# Patient Record
Sex: Female | Born: 1945 | Race: White | Hispanic: No | Marital: Married | Smoking: Former smoker
Health system: Southern US, Community
[De-identification: ages and names within clinical notes are randomized; demographics above are authoritative.]

## PROBLEM LIST (undated history)

## (undated) DIAGNOSIS — Z87440 Personal history of urinary (tract) infections: Secondary | ICD-10-CM

## (undated) DIAGNOSIS — E785 Hyperlipidemia, unspecified: Secondary | ICD-10-CM

## (undated) DIAGNOSIS — T7840XA Allergy, unspecified, initial encounter: Secondary | ICD-10-CM

## (undated) DIAGNOSIS — C20 Malignant neoplasm of rectum: Secondary | ICD-10-CM

## (undated) DIAGNOSIS — Z87891 Personal history of nicotine dependence: Secondary | ICD-10-CM

## (undated) DIAGNOSIS — I10 Essential (primary) hypertension: Secondary | ICD-10-CM

## (undated) DIAGNOSIS — R112 Nausea with vomiting, unspecified: Secondary | ICD-10-CM

## (undated) DIAGNOSIS — R1032 Left lower quadrant pain: Secondary | ICD-10-CM

## (undated) DIAGNOSIS — Z1239 Encounter for other screening for malignant neoplasm of breast: Secondary | ICD-10-CM

## (undated) DIAGNOSIS — Z85038 Personal history of other malignant neoplasm of large intestine: Secondary | ICD-10-CM

## (undated) HISTORY — PX: COLONOSCOPY: SHX174

## (undated) HISTORY — DX: Personal history of urinary (tract) infections: Z87.440

## (undated) HISTORY — DX: Personal history of nicotine dependence: Z87.891

## (undated) HISTORY — DX: Encounter for other screening for malignant neoplasm of breast: Z12.39

## (undated) HISTORY — DX: Malignant neoplasm of rectum: C20

## (undated) HISTORY — DX: Personal history of other malignant neoplasm of large intestine: Z85.038

## (undated) HISTORY — DX: Essential (primary) hypertension: I10

## (undated) HISTORY — DX: Left lower quadrant pain: R10.32

## (undated) HISTORY — DX: Nausea with vomiting, unspecified: R11.2

---

## 2009-03-28 IMAGING — CR PELVIS - 1-2 VIEW
1 series · 1 of 1 positions shown · non-contrast
Comparison: none

REASON FOR EXAM: pelvic abscess
COMMENTS:

[view not recorded]
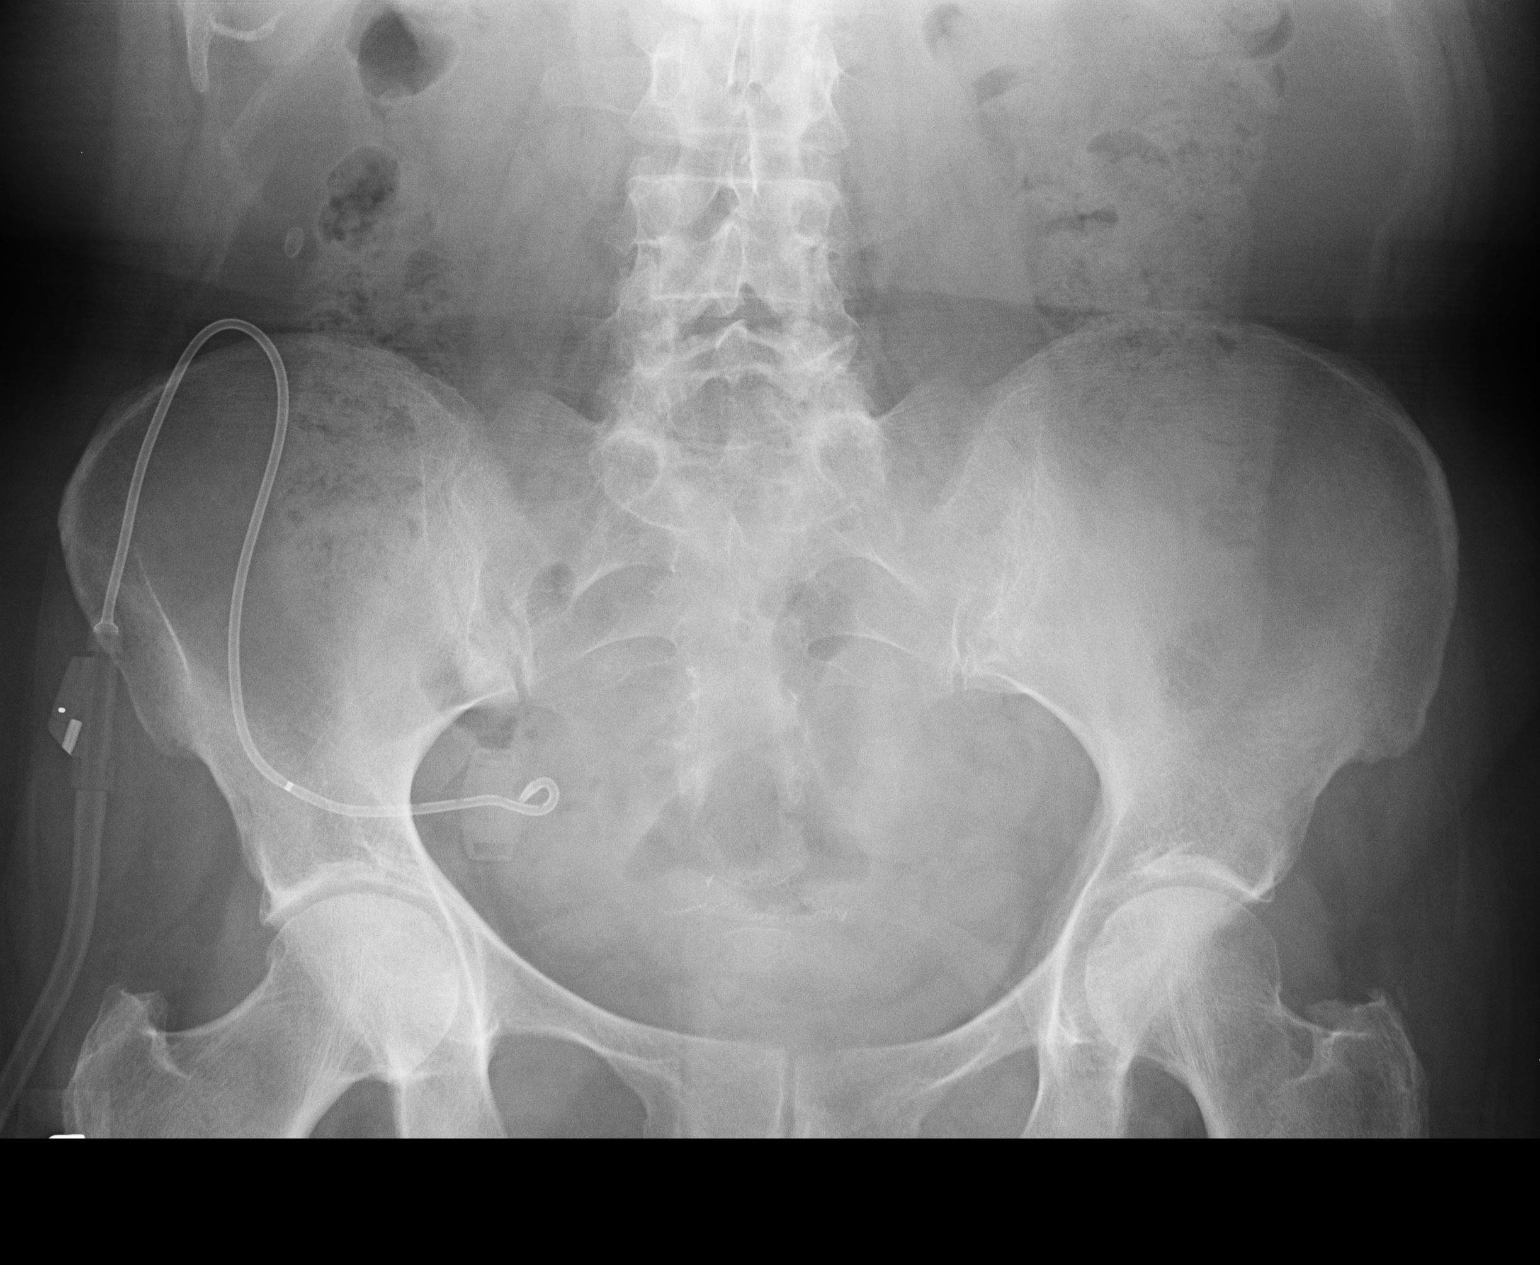

[1 of 1 positions shown; findings below may reference images not displayed]

PROCEDURE:     KDR - KDXR PELVIS AP ONLY  - [DATE]  [DATE]

RESULT:     Comparison is made to a prior exam of [DATE]. A drain
remains present on the right. The pelvic abscess noted in the clinical
history is not evident on routine radiography. The bony pelvis is normal in
appearance. There is a moderate amount of fecal material in the colon.
IMPRESSION: Please see above.

## 2009-05-20 DIAGNOSIS — Z87440 Personal history of urinary (tract) infections: Secondary | ICD-10-CM

## 2009-05-20 HISTORY — DX: Personal history of urinary (tract) infections: Z87.440

## 2010-01-29 ENCOUNTER — Ambulatory Visit: Payer: Self-pay

## 2010-02-19 ENCOUNTER — Ambulatory Visit: Payer: Self-pay | Admitting: Unknown Physician Specialty

## 2010-02-22 LAB — PATHOLOGY REPORT

## 2010-02-23 ENCOUNTER — Other Ambulatory Visit: Payer: Self-pay | Admitting: Unknown Physician Specialty

## 2010-02-26 ENCOUNTER — Ambulatory Visit: Payer: Self-pay | Admitting: Unknown Physician Specialty

## 2010-02-26 IMAGING — CT CT ABD-PELV W/ CM
1 of 3 series · 15 of 32 positions shown, 20 images · non-contrast
Comparison: none

REASON FOR EXAM: cancer of the rectum
COMMENTS:

PROCEDURE:     KCT - KCT ABDOMEN/PELVIS W  - [DATE]  [DATE]
RESULT:
HISTORY: Cancer of the rectum.

[Series 2: abd with 5.0 i40f · axial · 0.81mm/px · z∈[-1027,-612]mm · 15 of 95 slices shown, 20 images]
[im 6/95  soft-tissue]
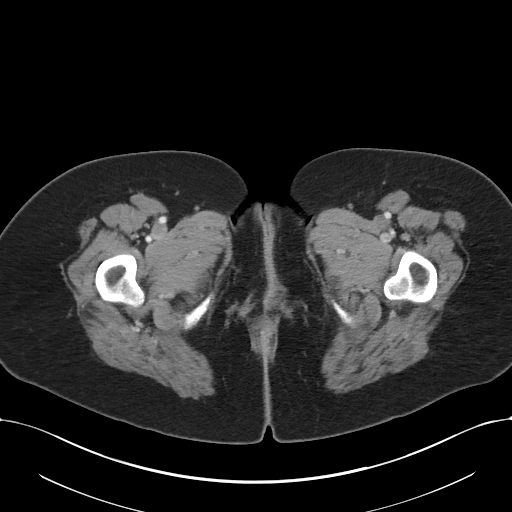
[im 6/95  bone]
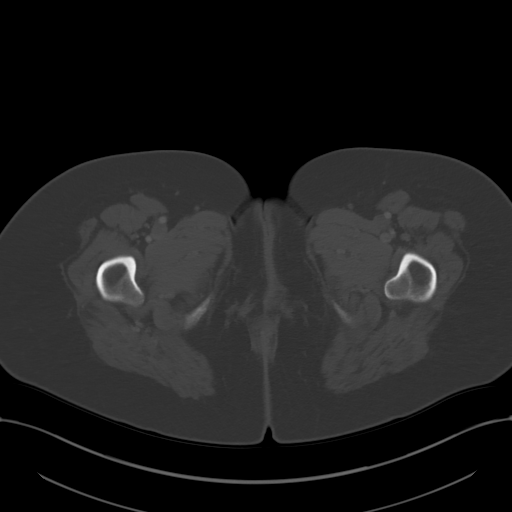
[im 11/95  soft-tissue]
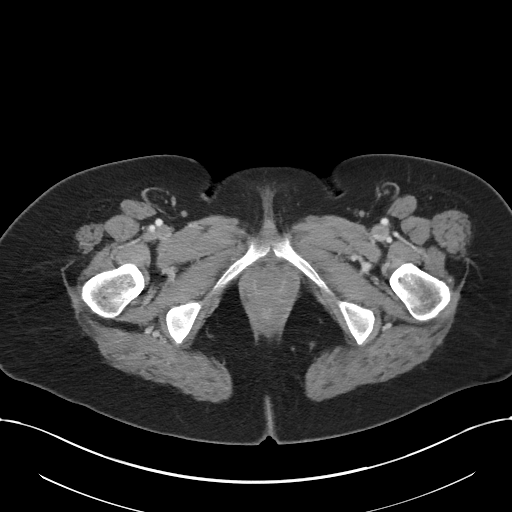
[im 21/95  soft-tissue]
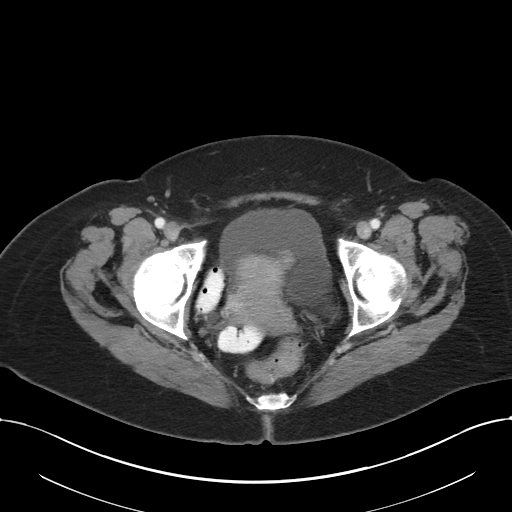
[im 27/95  soft-tissue]
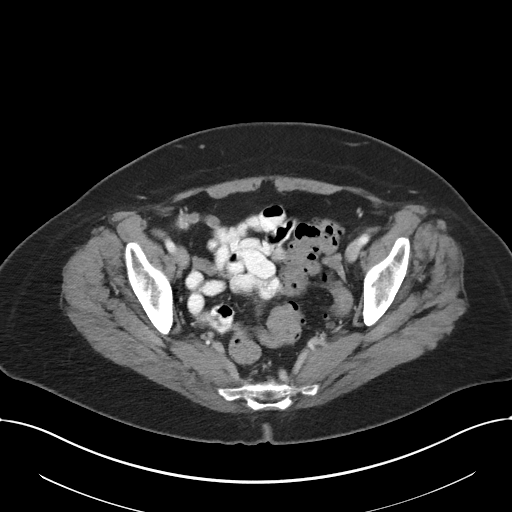
[im 32/95  soft-tissue]
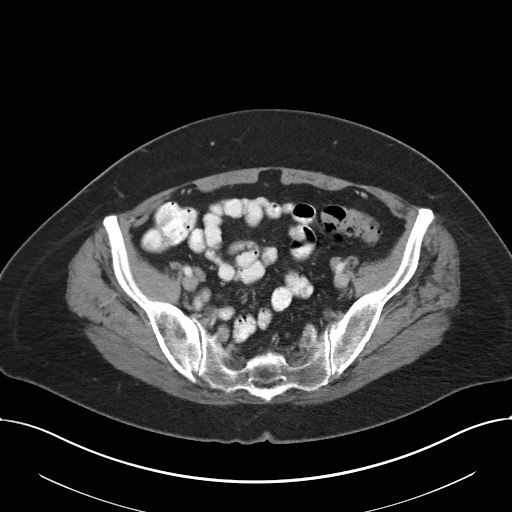
[im 37/95  soft-tissue]
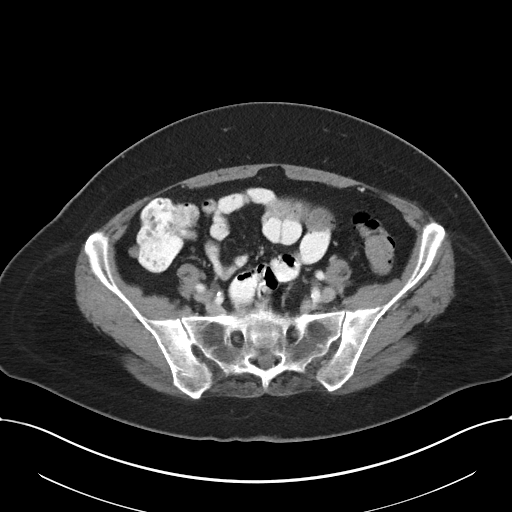
[im 42/95  soft-tissue]
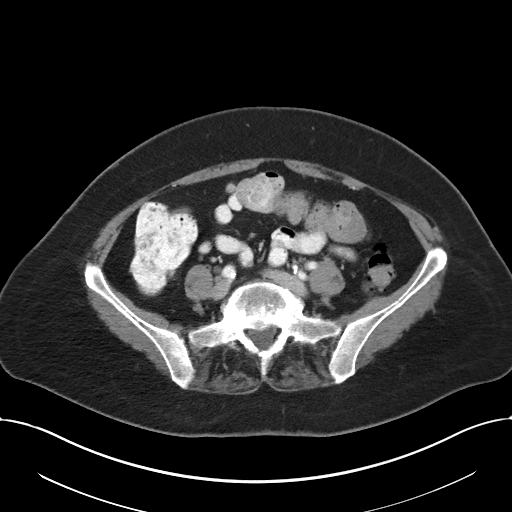
[im 53/95  soft-tissue]
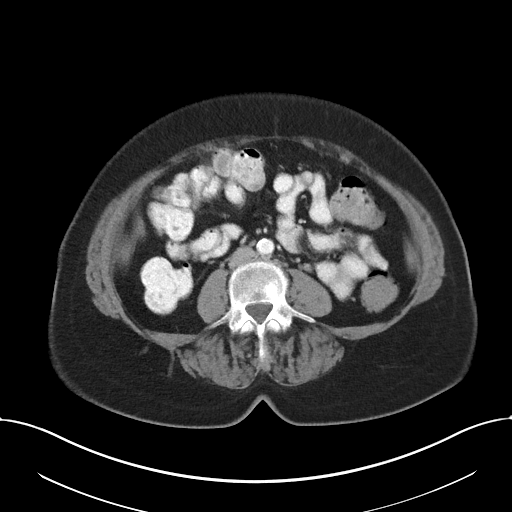
[im 58/95  soft-tissue]
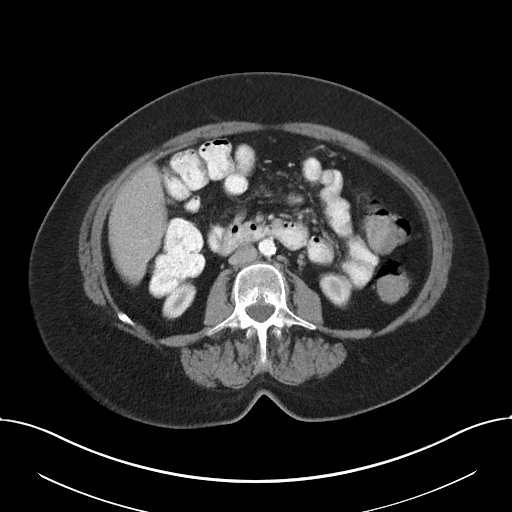
[im 58/95  bone]
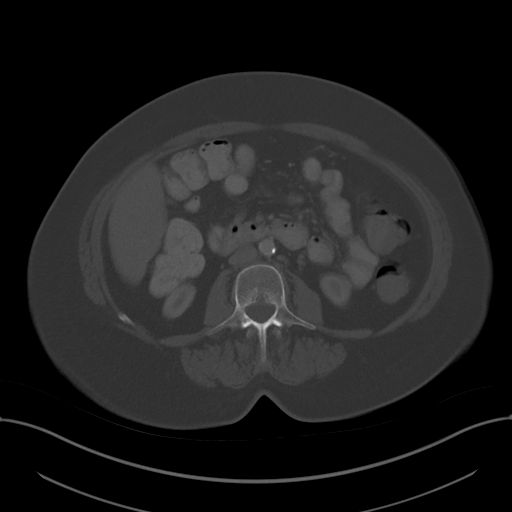
[im 63/95  soft-tissue]
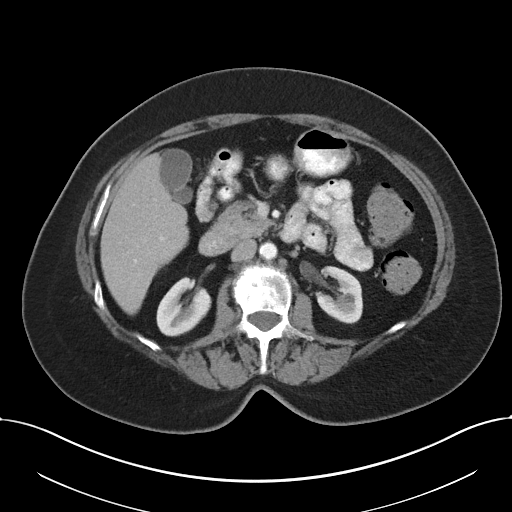
[im 68/95  soft-tissue]
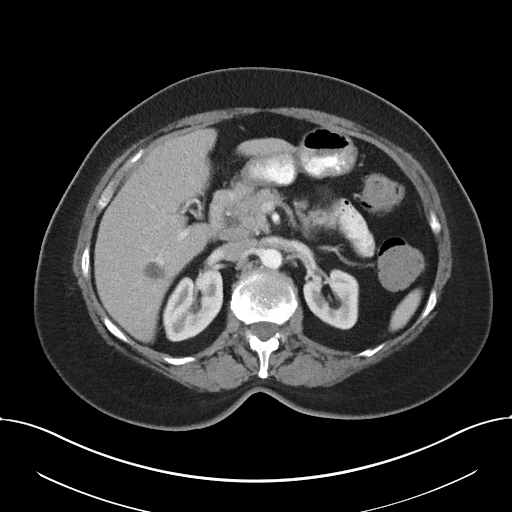
[im 74/95  soft-tissue]
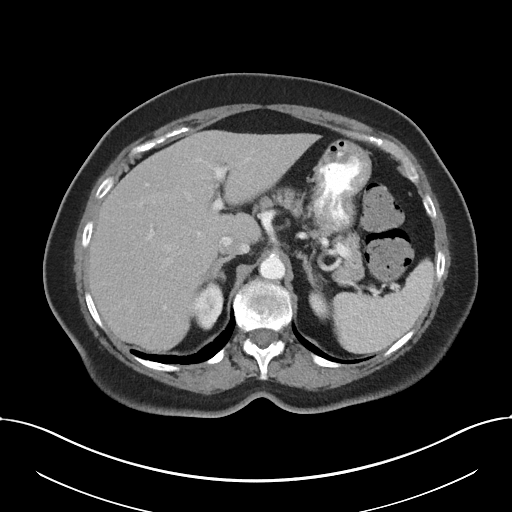
[im 74/95  lung]
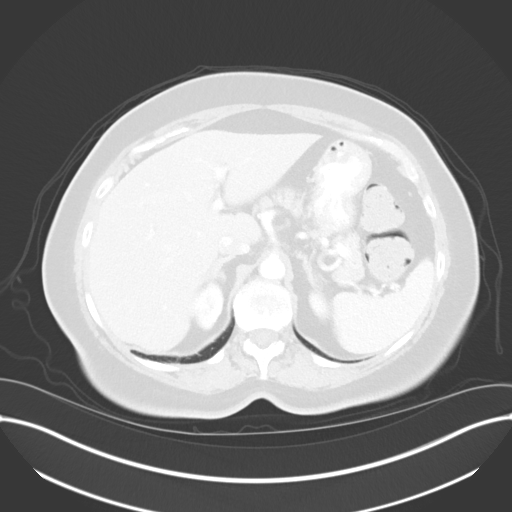
[im 79/95  lung]
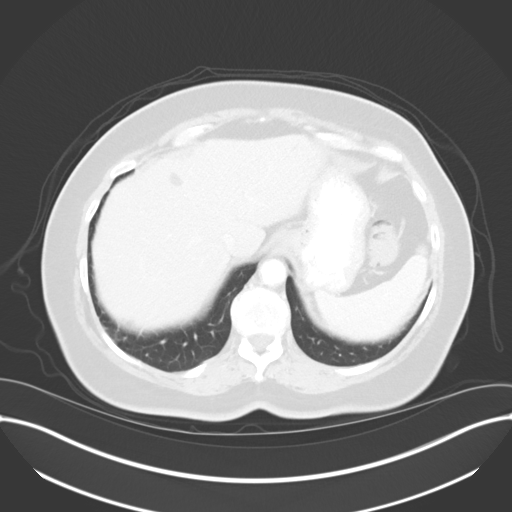
[im 84/95  soft-tissue]
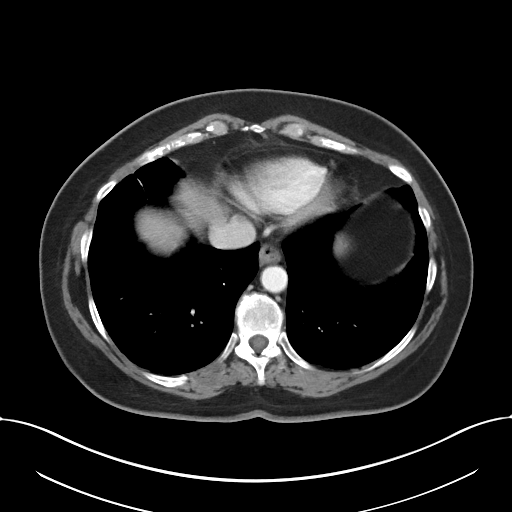
[im 84/95  lung]
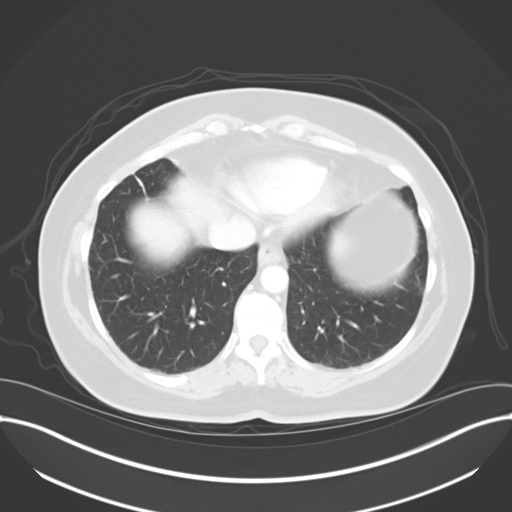
[im 89/95  soft-tissue]
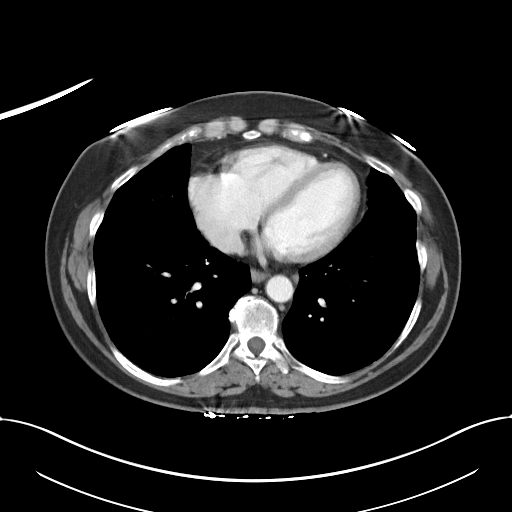
[im 89/95  lung]
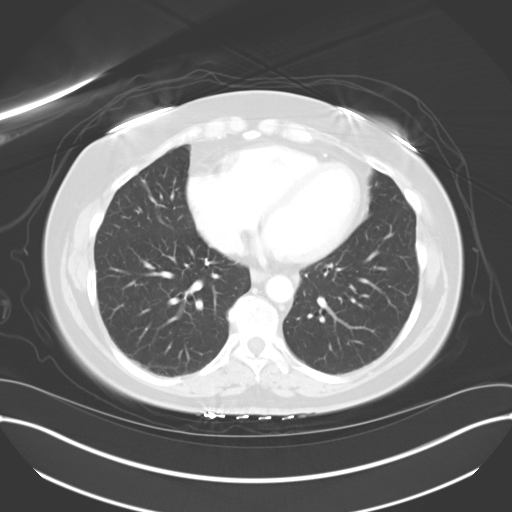

[15 of 32 positions shown; findings below may reference images not displayed]

FINDINGS: Standard CT was obtained with 100 mL of [0C]. The lung
bases are clear. No free air. Hepatic cyst is present. Spleen is normal.
Pancreas is normal. Renal cysts are present. Adrenals are normal. The
appendix measures 9 mm in diameter. Appendicitis could present in this
fashion. There is no periappendiceal swelling.
IMPRESSION: Distention of the appendix. Appendicitis cannot be excluded.

## 2010-03-05 ENCOUNTER — Ambulatory Visit: Payer: Self-pay | Admitting: General Surgery

## 2010-03-05 IMAGING — CR DG CHEST 2V
1 series · 2 of 2 positions shown · non-contrast
Comparison: none

REASON FOR EXAM: ca
COMMENTS:

[Series 1: view not recorded · 0.17mm/px · 2 of 2 slices shown]
[im 1/2]
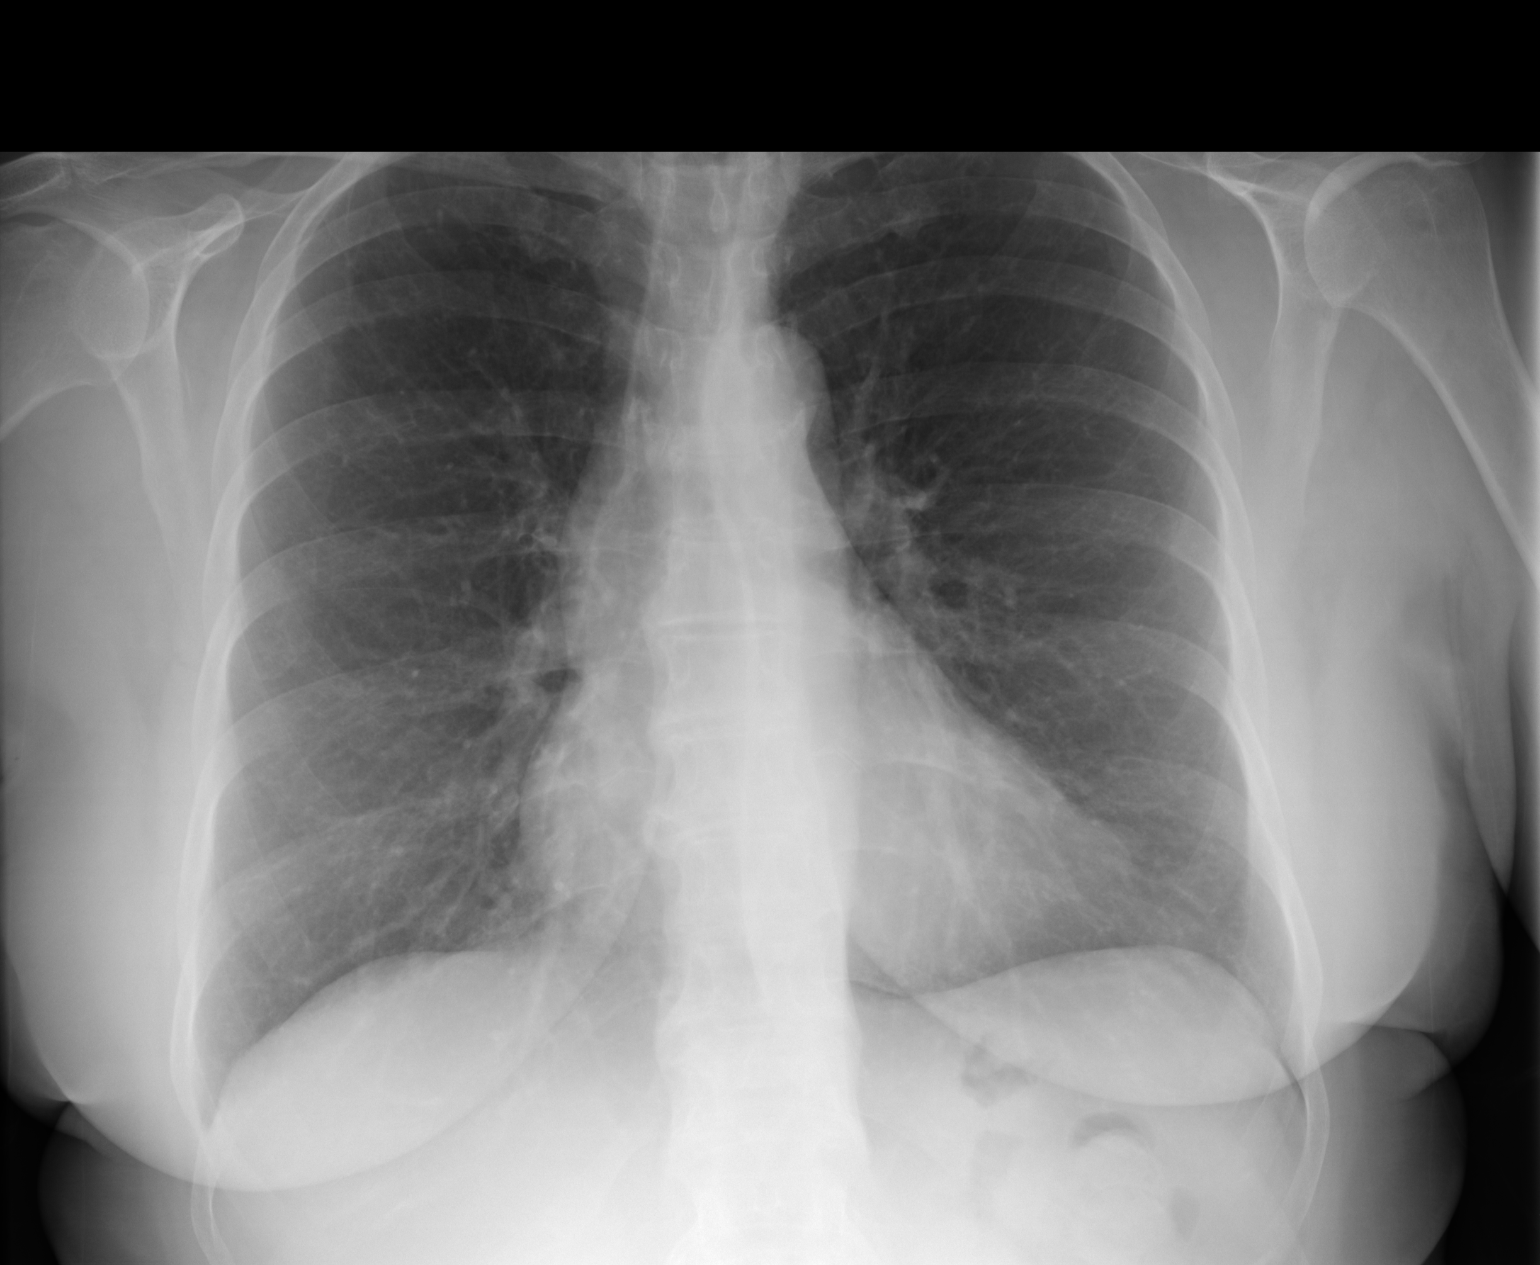
[im 2/2]
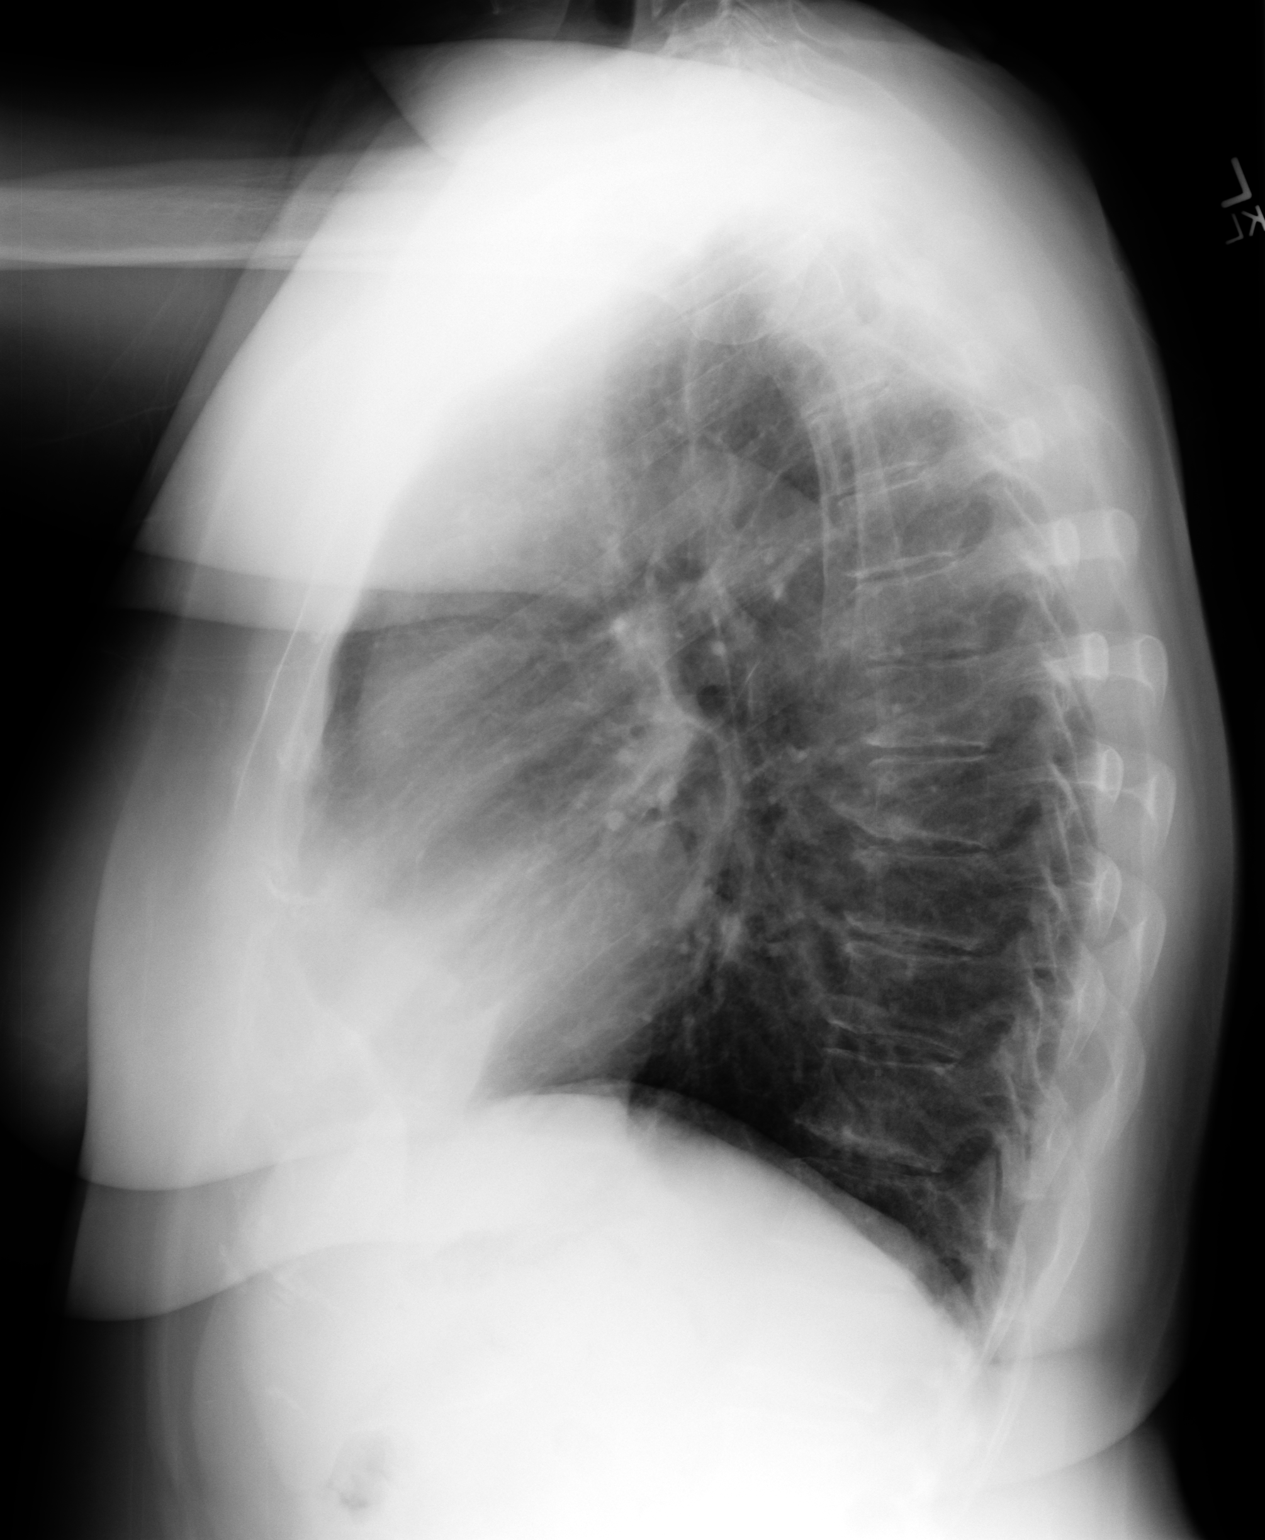

[2 of 2 positions shown; findings below may reference images not displayed]

PROCEDURE:     DXR - DXR CHEST PA (OR AP) AND LATERAL  - [DATE] [DATE]

RESULT:     The lungs are mildly hyperinflated. There is no evidence of
focal infiltrates, effusions or edema. Cardiac silhouette and visualized
bony skeleton is unremarkable. There is no radiographic evidence of
pulmonary nodules or masses. If there is persistent clinical concern,
further evaluation with chest CT is recommended.
IMPRESSION: 1. Chest radiograph without evidence of acute cardiopulmonary disease.
2. Findings consistent with mild COPD.

## 2010-03-14 ENCOUNTER — Inpatient Hospital Stay: Payer: Self-pay | Admitting: General Surgery

## 2010-03-14 HISTORY — PX: COLON SURGERY: SHX602

## 2010-03-19 ENCOUNTER — Ambulatory Visit: Payer: Self-pay | Admitting: Cardiology

## 2010-03-21 ENCOUNTER — Ambulatory Visit: Payer: Self-pay | Admitting: Cardiology

## 2010-03-21 LAB — PATHOLOGY REPORT

## 2010-04-02 ENCOUNTER — Ambulatory Visit: Payer: Self-pay | Admitting: General Surgery

## 2010-04-02 IMAGING — CR DG ABDOMEN 3V
1 series · 4 of 4 positions shown · non-contrast
Comparison: none

REASON FOR EXAM: Nausea and Vomiting
COMMENTS:

[Series 1: view not recorded · 0.17mm/px · 4 of 4 slices shown]
[im 1/4]
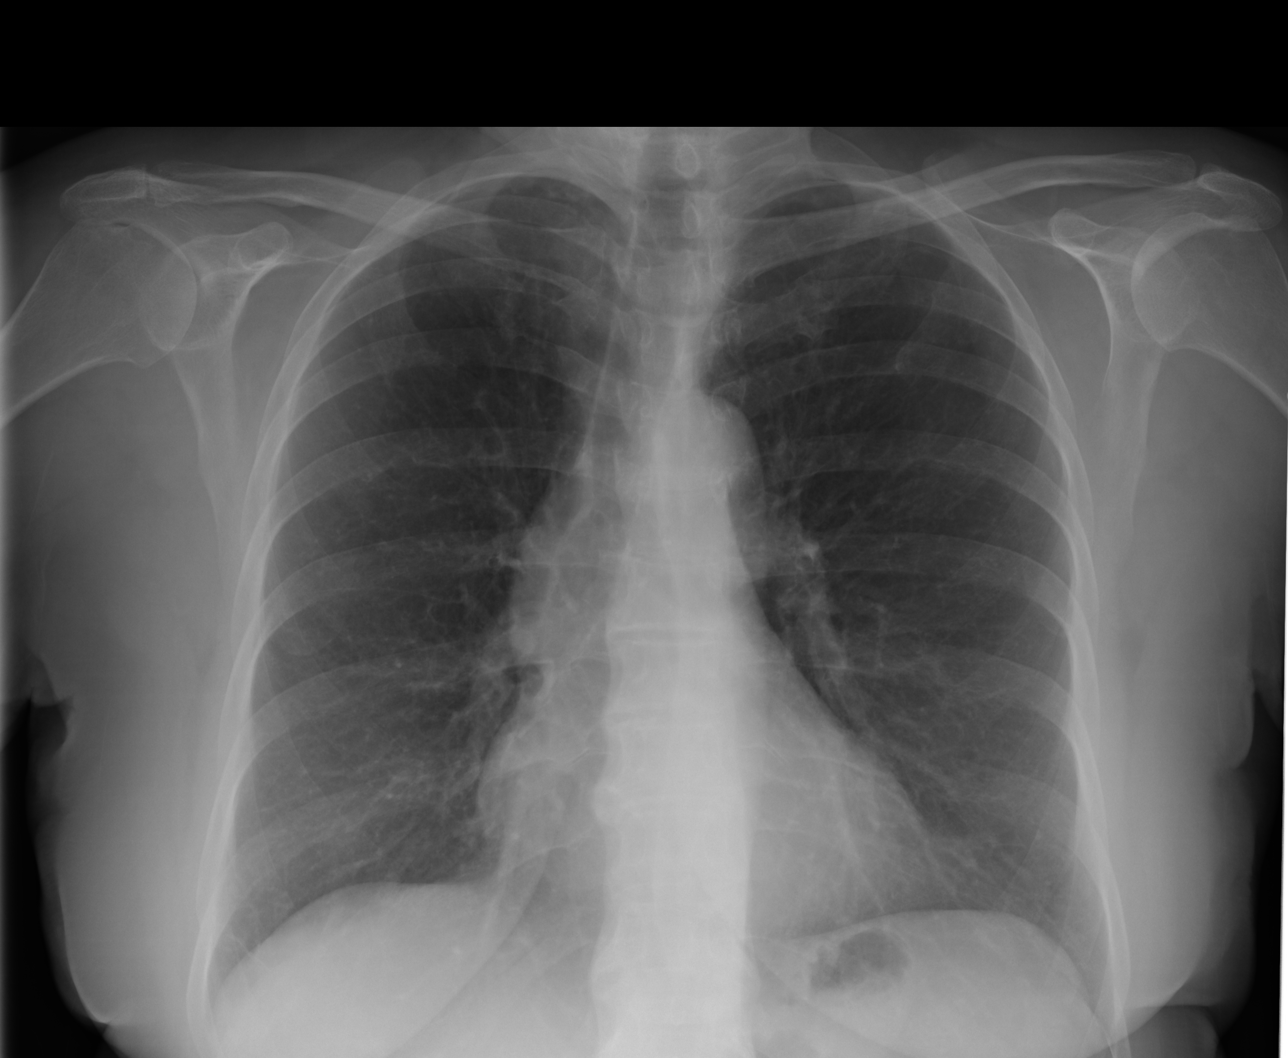
[im 2/4]
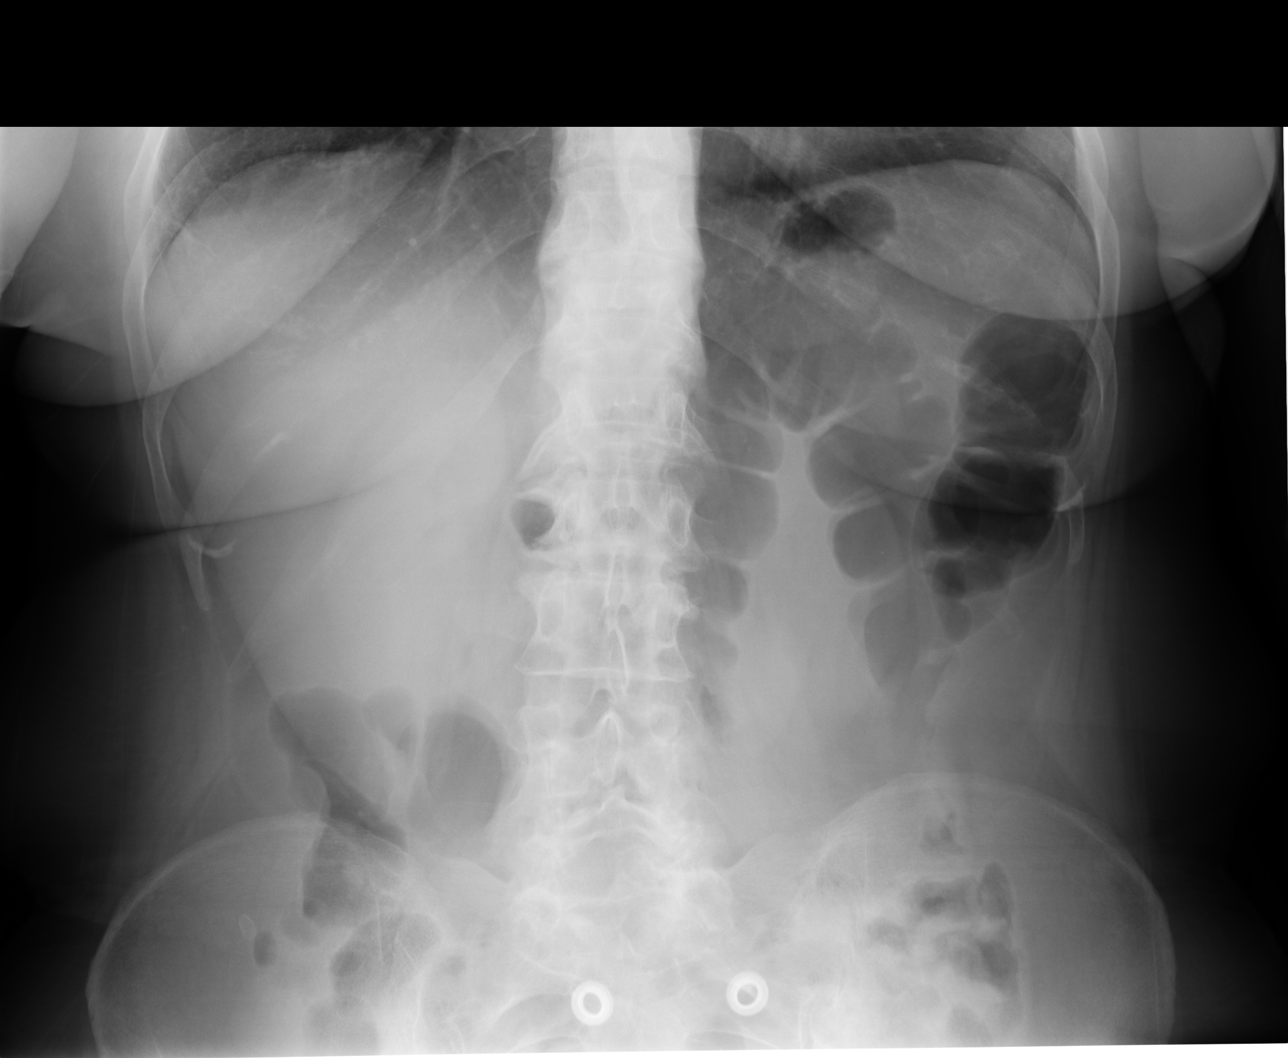
[im 3/4]
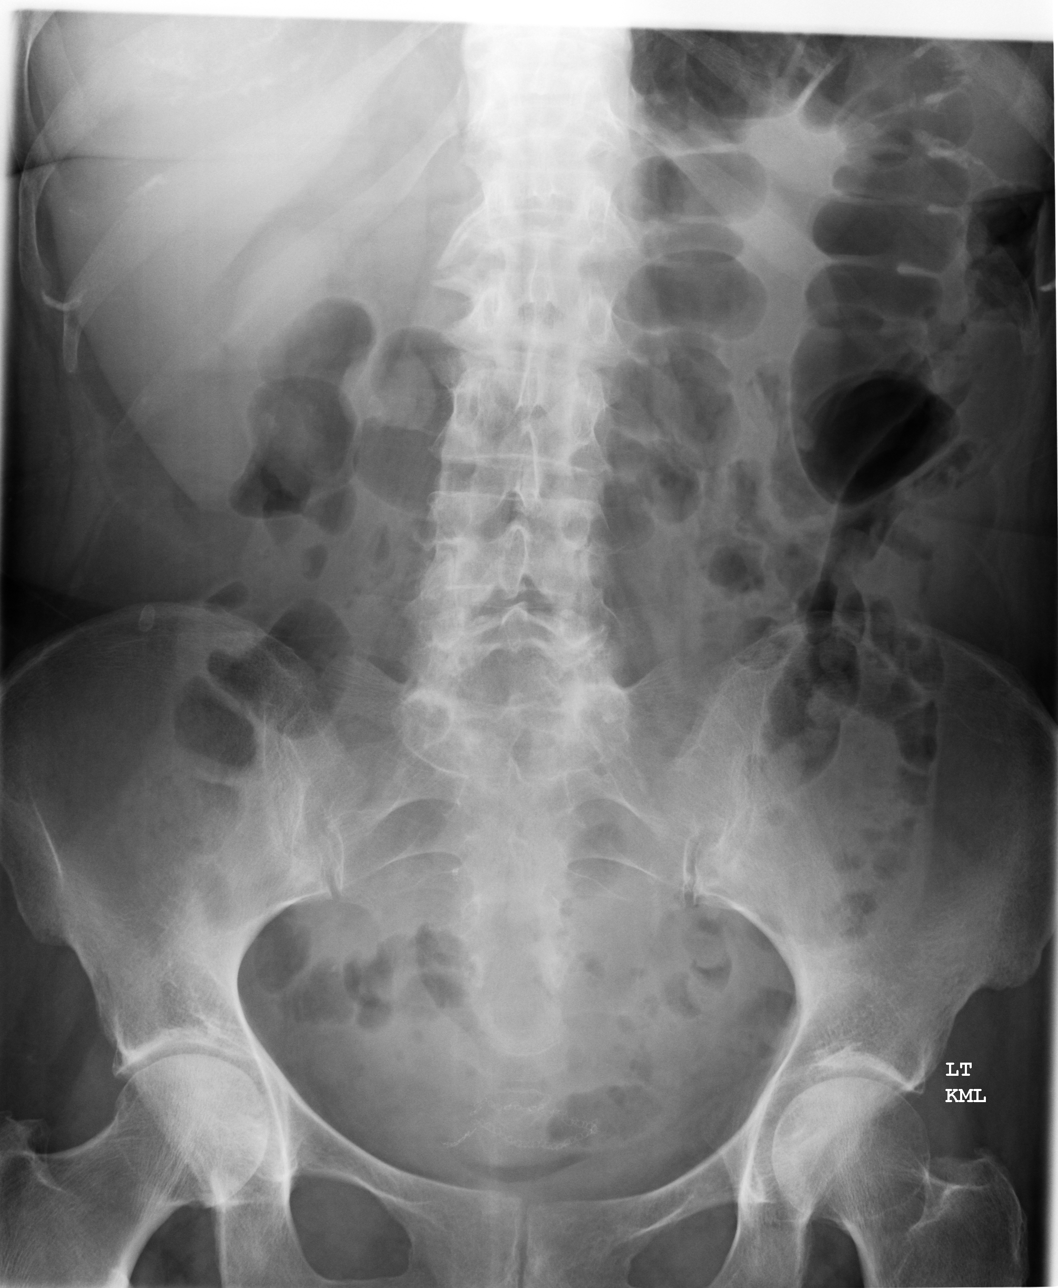
[im 4/4]
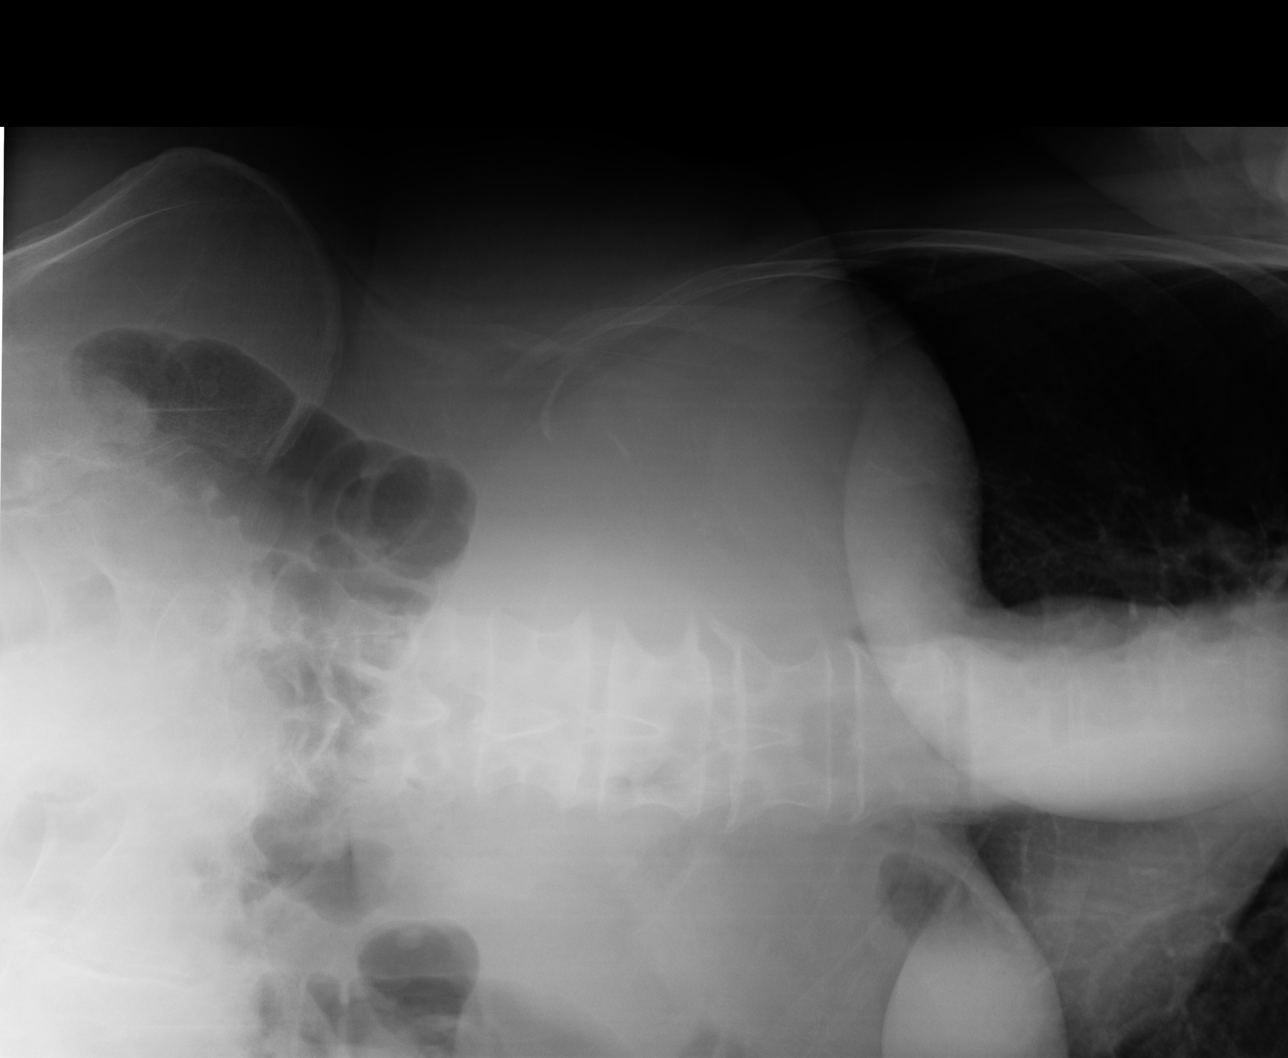

[4 of 4 positions shown; findings below may reference images not displayed]

PROCEDURE:     DXR - DXR ABDOMEN 3-WAY (INCL PA CXR)  - [DATE]  [DATE]

RESULT:     The study is compared to a two view chest dated [DATE].

Frontal view of the chest demonstrates no evidence of focal infiltrates,
effusions or edema.

Air is appreciated within nondilated loops of large and small bowel. The
visualized bony skeleton is unremarkable.
IMPRESSION: Nonobstructive bowel gas pattern.

## 2010-04-03 ENCOUNTER — Inpatient Hospital Stay: Payer: Self-pay | Admitting: General Surgery

## 2010-04-03 IMAGING — CT CT ABD-PELV W/ CM
1 of 3 series · 13 of 32 positions shown, 18 images · IV contrast (isovue)
Comparison: none

REASON FOR EXAM: (1) Leukocytosis, vomiting 3 weeks s/p low anterior
resection.; (2) Same. LONG S
COMMENTS:

PROCEDURE:     CT  - CT ABDOMEN / PELVIS  W  - [DATE]  [DATE]
RESULT:     Comparison:  [DATE].
TECHNIQUE: Multiple axial images of the abdomen and pelvis were performed
from the lung bases to the pubic symphysis, with p.o. contrast and with 100
mL of Isovue 370 intravenous contrast. Delayed images were also obtained of
the abdomen.

[Series 2: 3mm soft tissue · axial · 0.75mm/px · z∈[-1150,-730]mm · 13 of 158 slices shown, 18 images]
[im 9/158  soft-tissue]
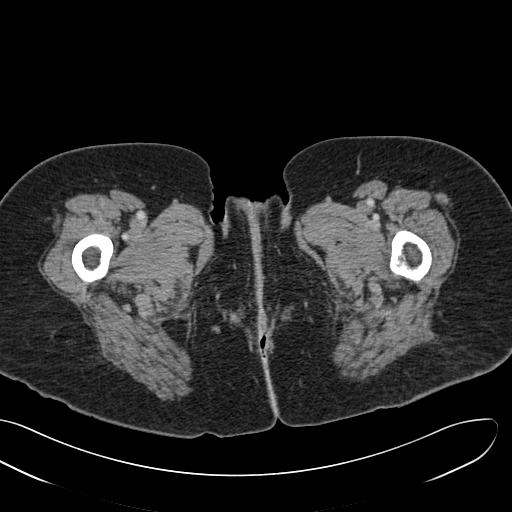
[im 9/158  bone]
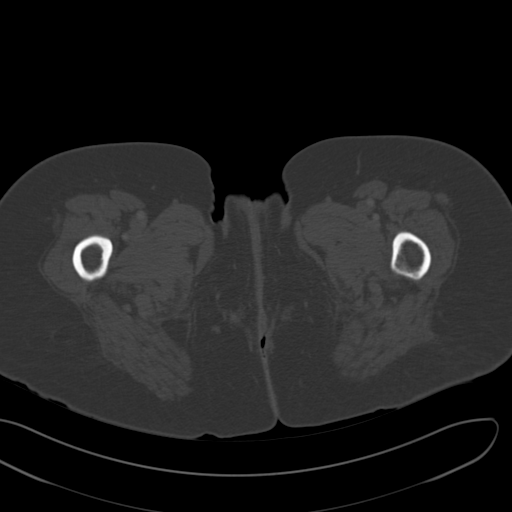
[im 25/158  soft-tissue]
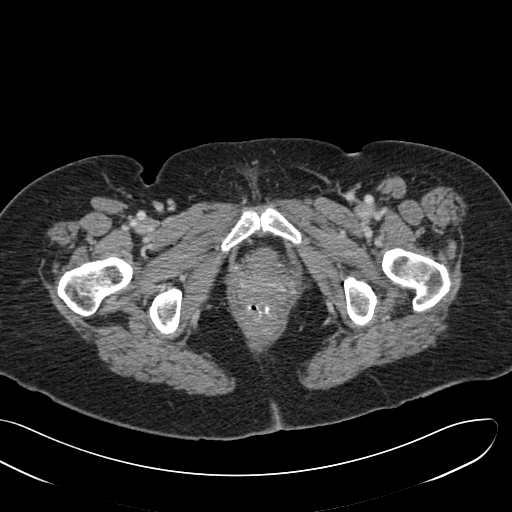
[im 34/158  soft-tissue]
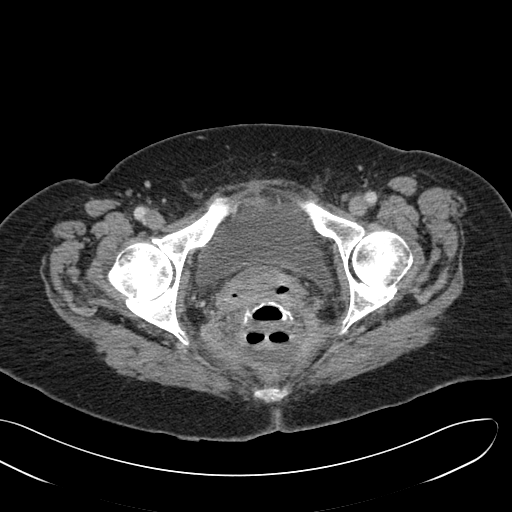
[im 50/158  soft-tissue]
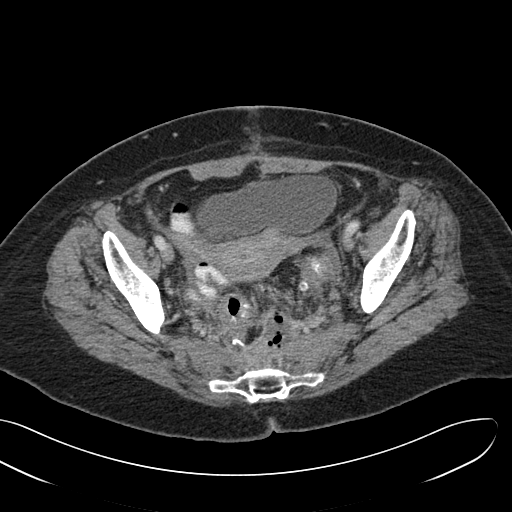
[im 58/158  soft-tissue]
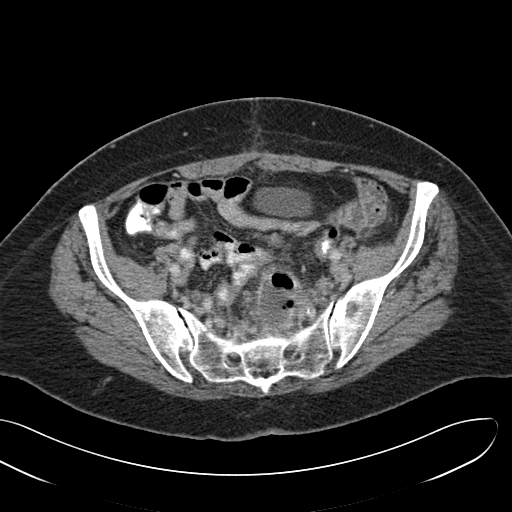
[im 75/158  soft-tissue]
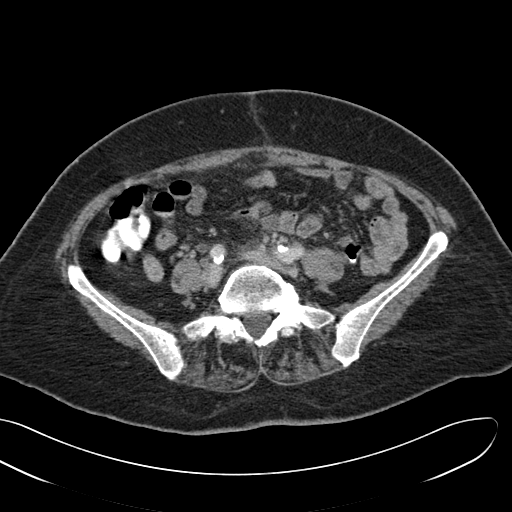
[im 83/158  soft-tissue]
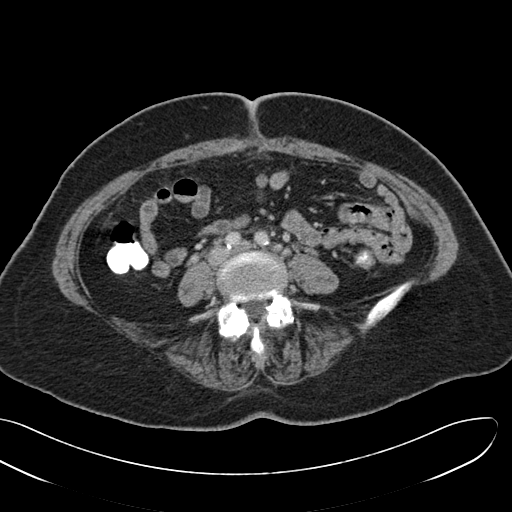
[im 100/158  soft-tissue]
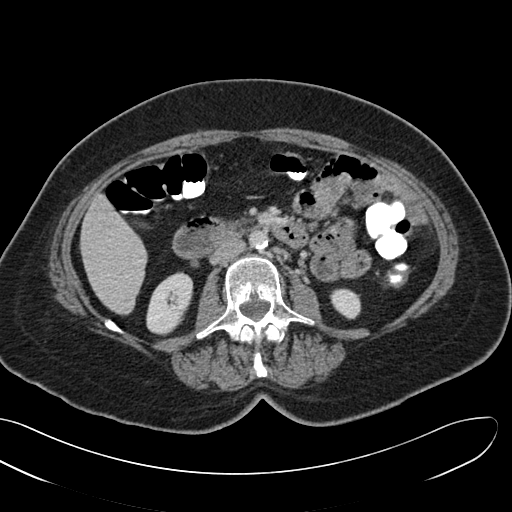
[im 108/158  soft-tissue]
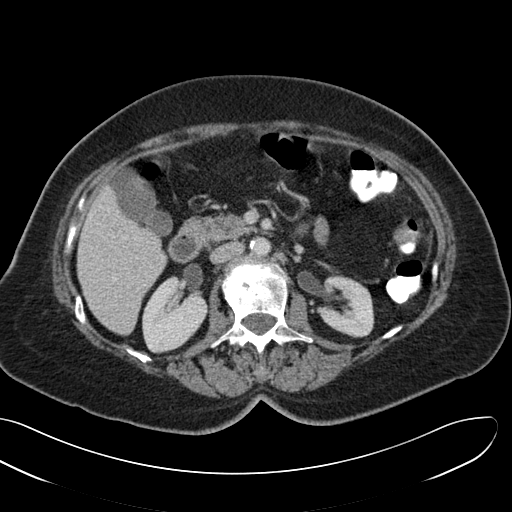
[im 108/158  bone]
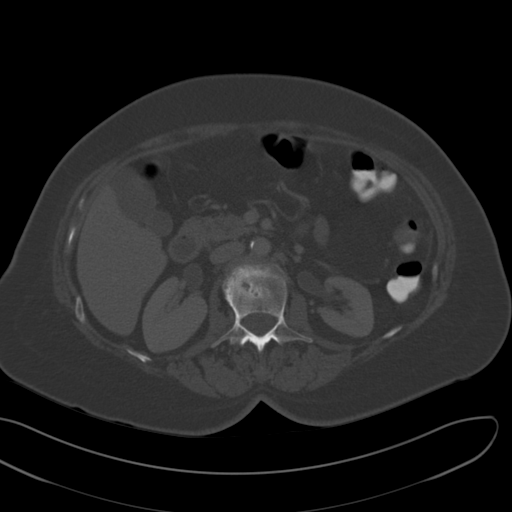
[im 124/158  soft-tissue]
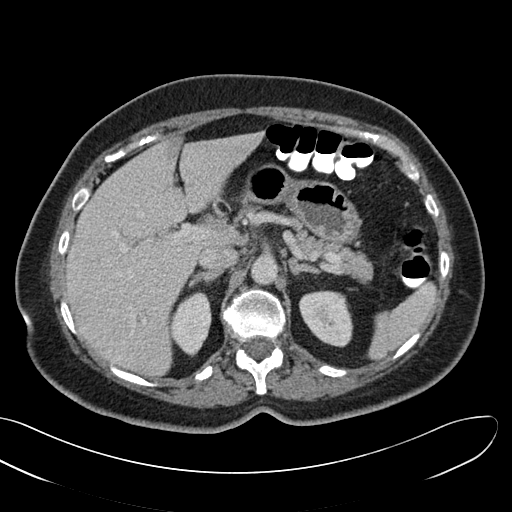
[im 124/158  lung]
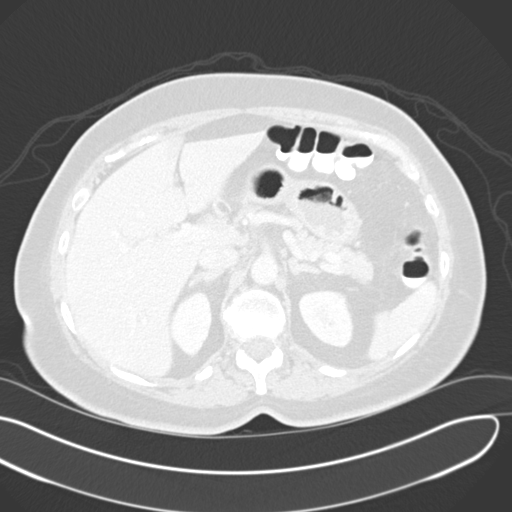
[im 133/158  soft-tissue]
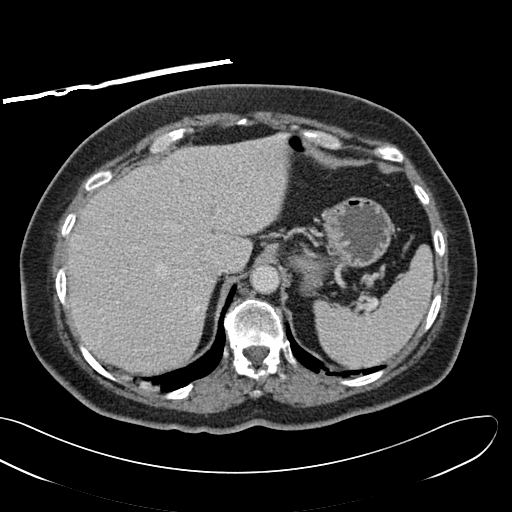
[im 133/158  lung]
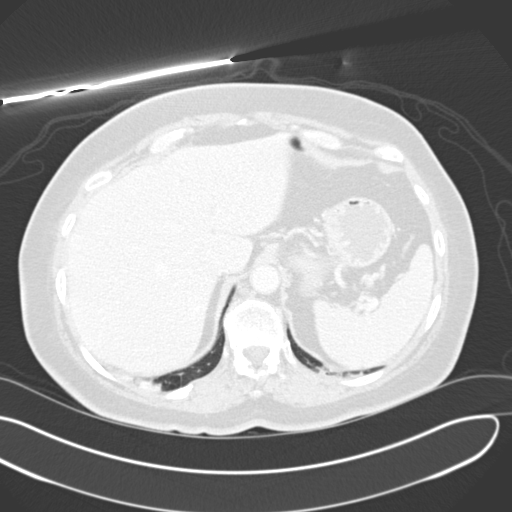
[im 141/158  lung]
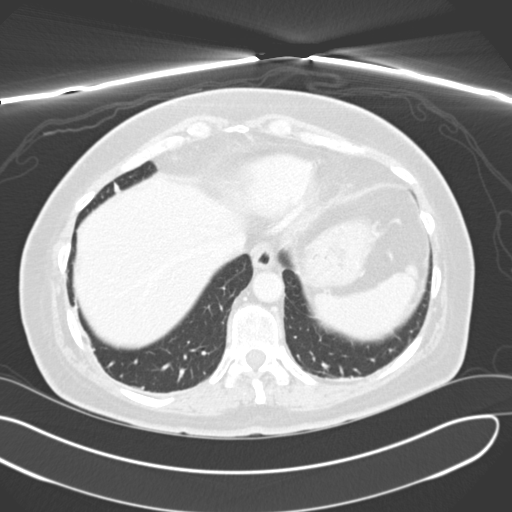
[im 149/158  soft-tissue]
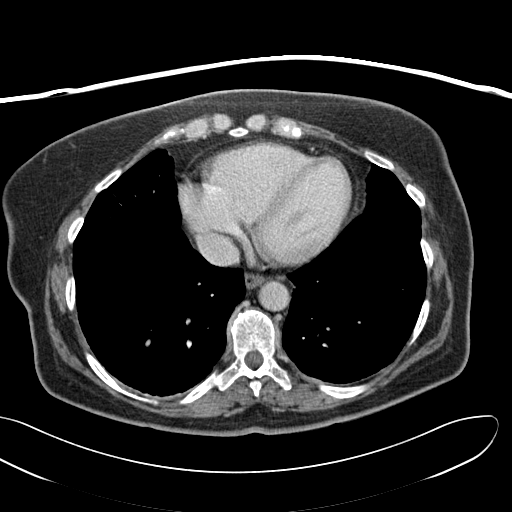
[im 149/158  lung]
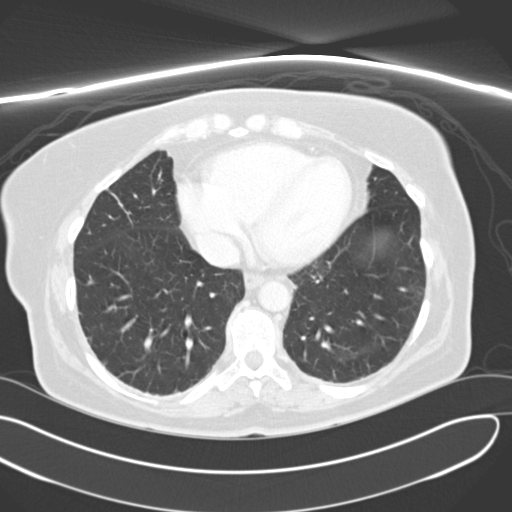

[13 of 32 positions shown; findings below may reference images not displayed]

FINDINGS: There are multiple low-attenuation lesions in the liver. The largest are
consistent with cysts. The others are too small to characterize. Mild
low-attenuation along the falciform ligament likely represents focal fatty
deposition. The gallbladder, spleen, adrenals, and pancreas are
unremarkable. Small low-attenuation lesion in the left kidney is too small
to characterize. There is no hydronephrosis. Vascular calcification
demonstrated in the abdominal aorta.

The small and large bowel are normal in caliber. Post surgical changes in
bowel suture line are demonstrated at the rectum. There appears to be a
segment of blind ending bowel which contains oral contrast extending to the
right and superior of the most inferior bowel suture line. Posterior to the
colon, in the presacral space, there is an air and fluid collection which
measures 7.4 x 3.9 cm in greatest axial dimension. This fluid collection
extends superiorly to the level of the superior aspect of the sacrum. There
is wall thickening of the inferior colon adjacent to the fluid collection
which is likely reactive. A small amount of oral contrast material is seen
within the distal colon. However, definite oral contrast material is not
seen within the fluid collection.

No aggressive lytic or sclerotic osseous lesions identified.
IMPRESSION: 1. Moderate size air/fluid collection in the presacral space extending
superiorly to the superior aspect the sacrum, concerning for abscess. While
no definite oral contrast material is seen within this fluid collection and
the air may be related to infection, the degree of air raises the
possibility of anastomotic leak.
2. Findings are felt to represent a small blind ending loop of bowel
containing oral contrast extending superior and to the right of the
anastomosis. Correlate with surgical procedure to exclude a separate
contained collection.

## 2010-04-04 IMAGING — CT CT PELVIS W/O CM
1 series · 16 of 31 positions shown, 20 images · non-contrast
Comparison: none

REASON FOR EXAM: presacral fluid collection
COMMENTS:   LMP: N/A

PROCEDURE:     CT  - CT PELVIS STANDARD WO  - [DATE] [DATE]
RESULT:
HISTORY: Presacral fluid collection.

[Series 2: soft tissue · axial · 0.74mm/px · z∈[-138,-2]mm · 16 of 31 slices shown, 20 images]
[im 3/31  soft-tissue]
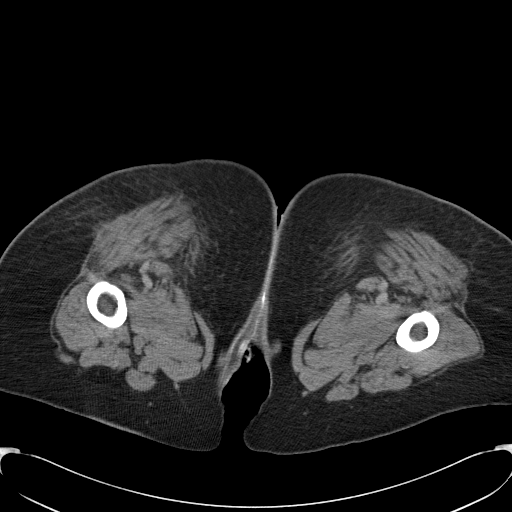
[im 3/31  bone]
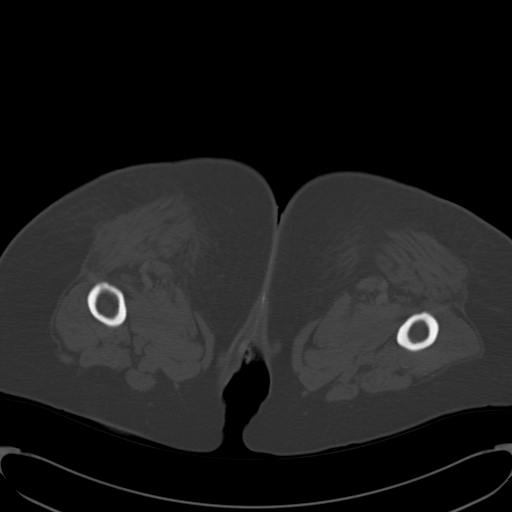
[im 5/31  soft-tissue]
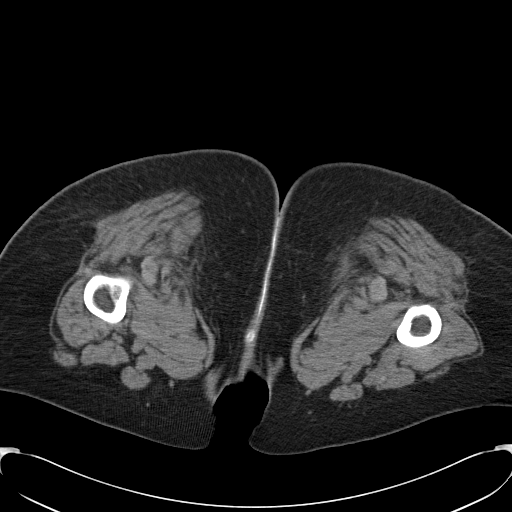
[im 7/31  soft-tissue]
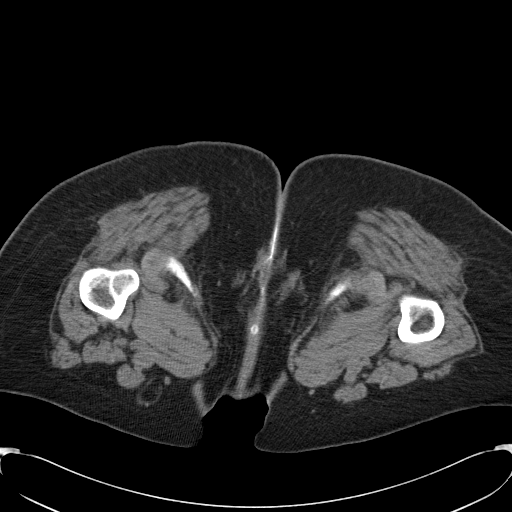
[im 9/31  soft-tissue]
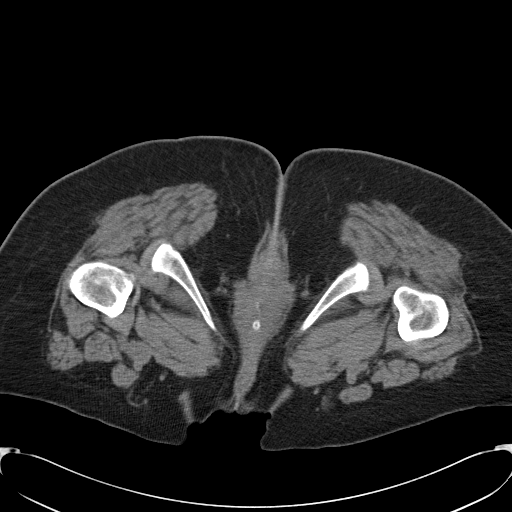
[im 11/31  soft-tissue]
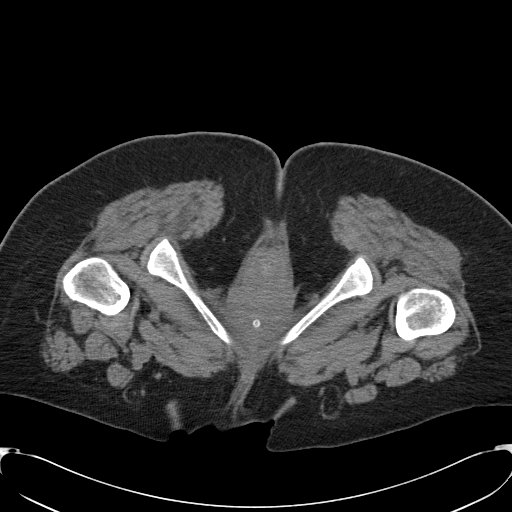
[im 13/31  soft-tissue]
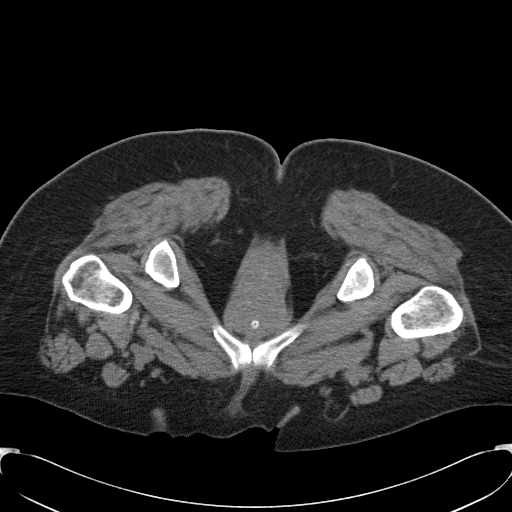
[im 15/31  soft-tissue]
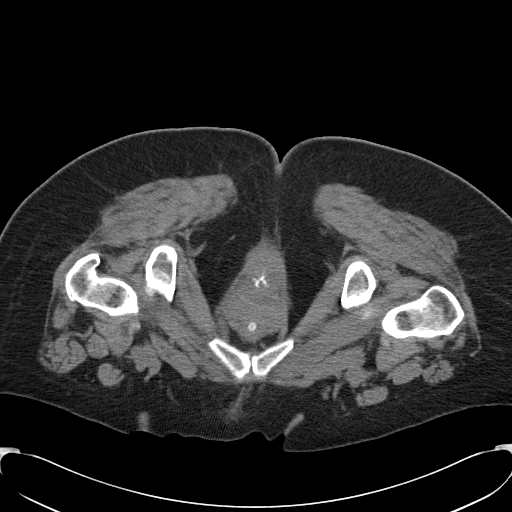
[im 17/31  soft-tissue]
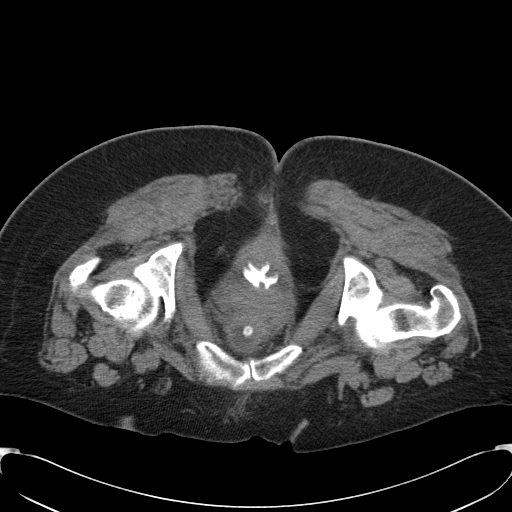
[im 19/31  soft-tissue]
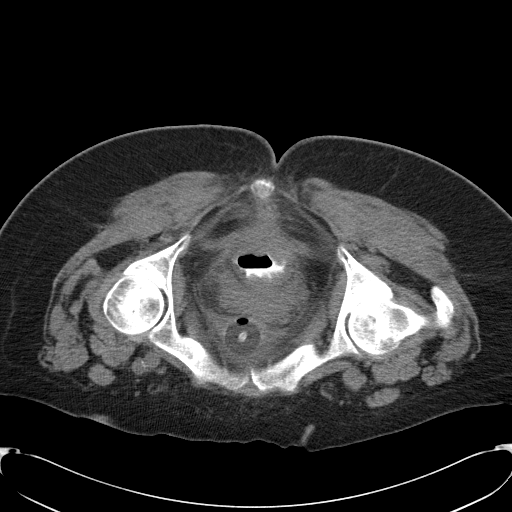
[im 19/31  bone]
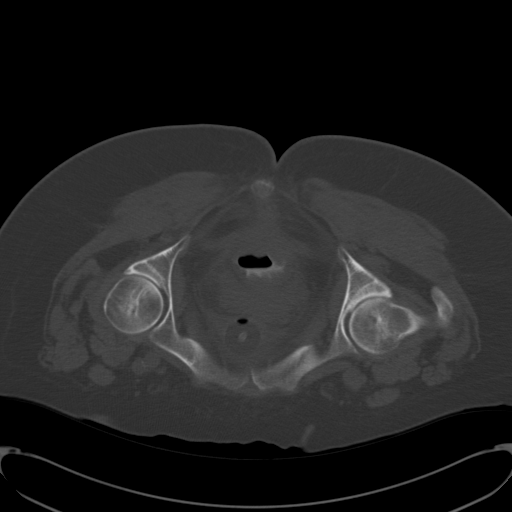
[im 21/31  soft-tissue]
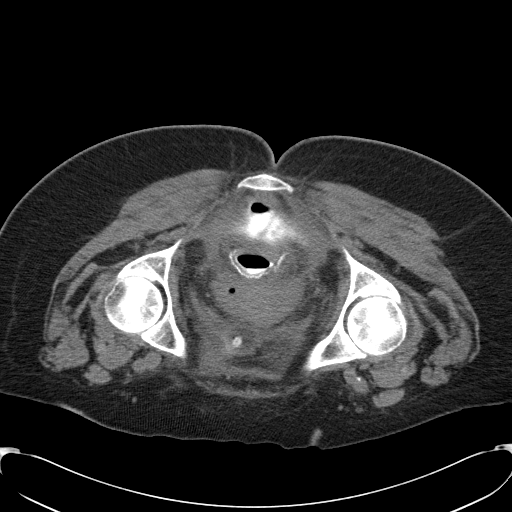
[im 23/31  soft-tissue]
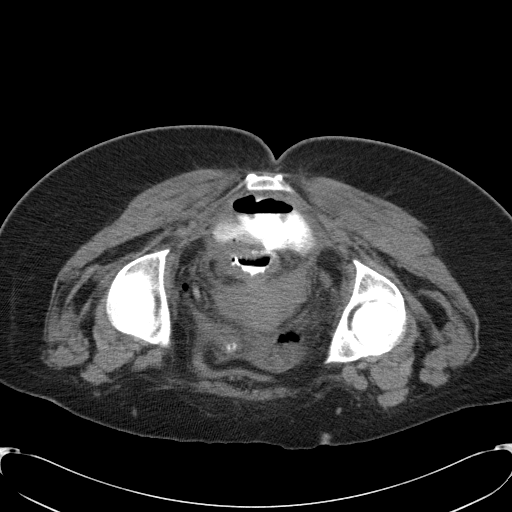
[im 25/31  soft-tissue]
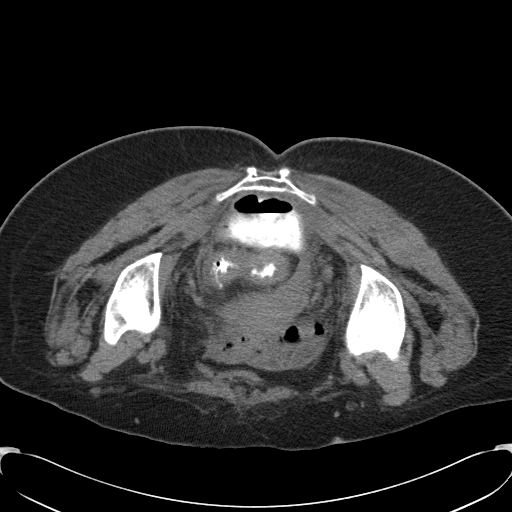
[im 27/31  soft-tissue]
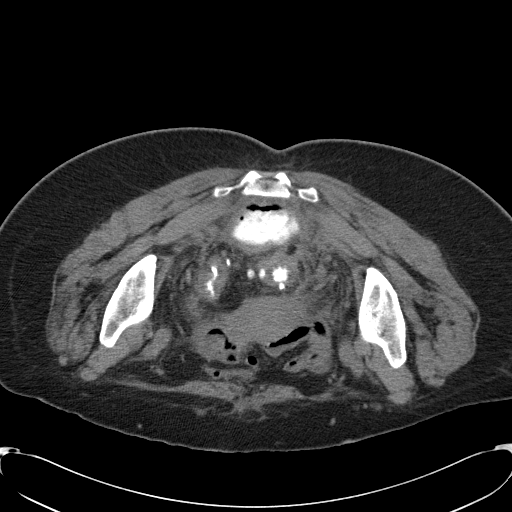
[im 27/31  lung]
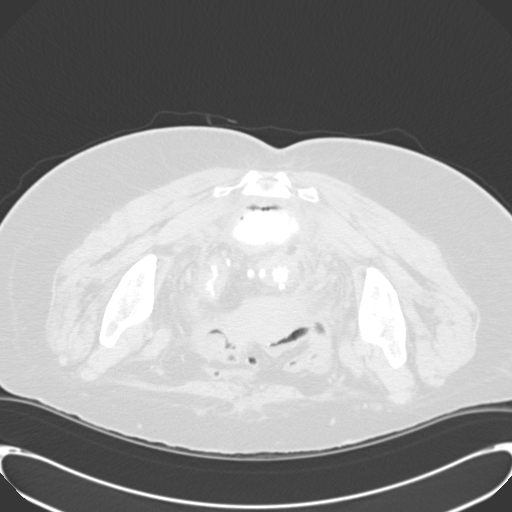
[im 28/31  lung]
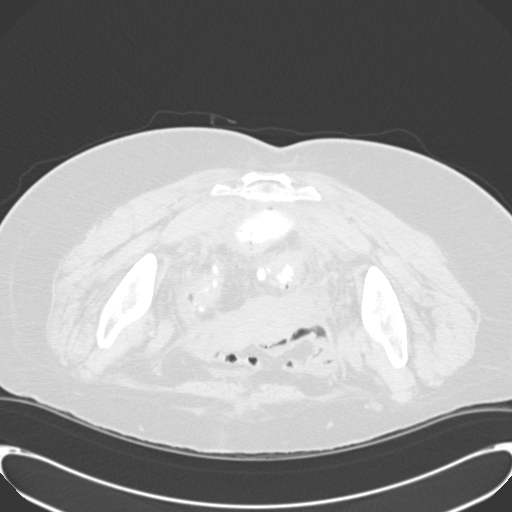
[im 29/31  soft-tissue]
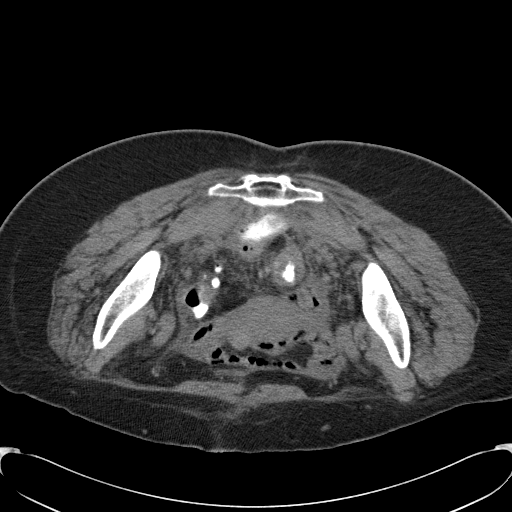
[im 29/31  lung]
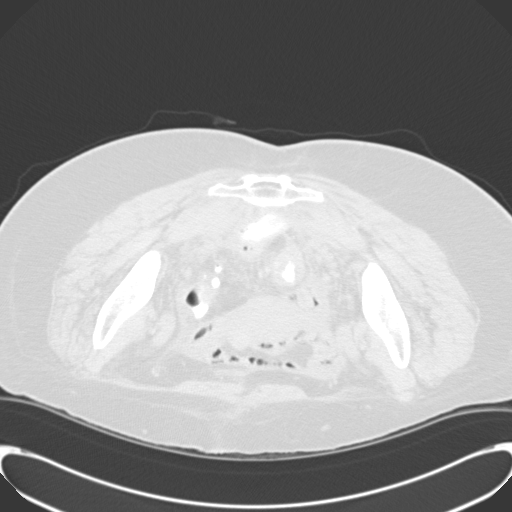
[im 30/31  lung]
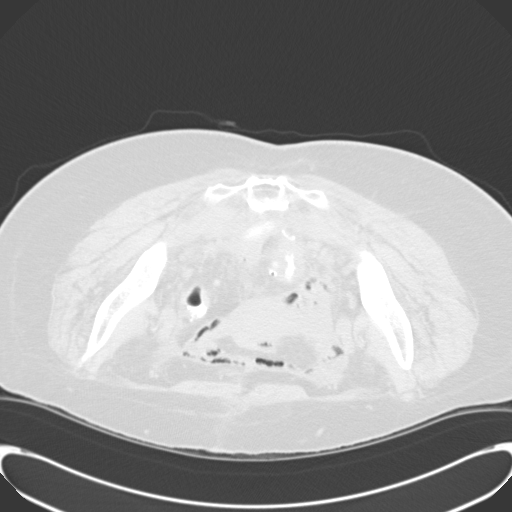

[16 of 31 positions shown; findings below may reference images not displayed]

PROCEDURE AND FINDINGS:  Prone CT is obtained prior to drainage. A large
amount of contrast is noted in the presacral space. This appears to be
present at the previously identified presacral fluid collection/abscess with
communication with the rectosigmoid. Rectosigmoid wall thickening is again
noted. These findings were discussed with Dr. NATOYA. No abscess drainage
was performed at this time.
IMPRESSION: Presacral fluid collection communicates with the
rectosigmoid as described above. Contrast is noted in this fluid collection.
The fluid collection may also contain stool. This suggests fistulous
communication with the adjacent bowel.

## 2010-04-16 ENCOUNTER — Ambulatory Visit: Payer: Self-pay | Admitting: General Surgery

## 2010-04-16 IMAGING — CT CT PELVIS W/O CM
1 series · 15 of 32 positions shown, 19 images · non-contrast
Comparison: none

REASON FOR EXAM: ORAL CONTRAST ONLY  pelvic abscess FU
COMMENTS:

[Series 2: pelvis wo 5.0 i40f · axial · 0.88mm/px · z∈[-1542,-1292]mm · 15 of 56 slices shown, 19 images]
[im 4/56  soft-tissue]
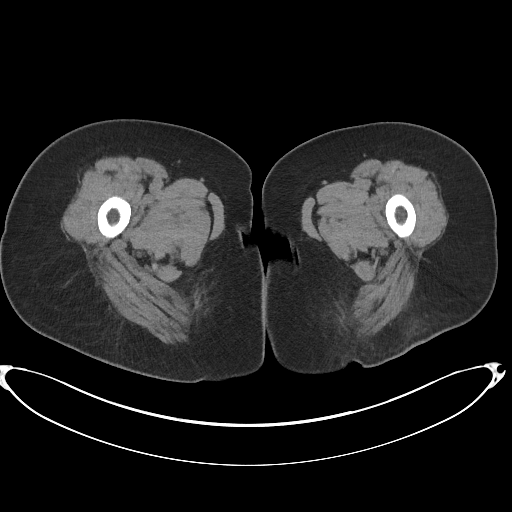
[im 4/56  bone]
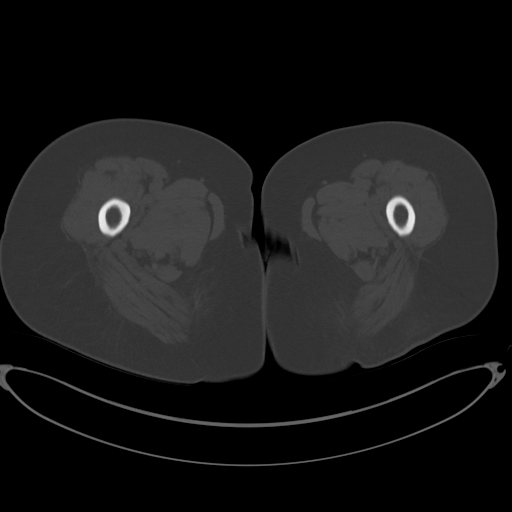
[im 8/56  soft-tissue]
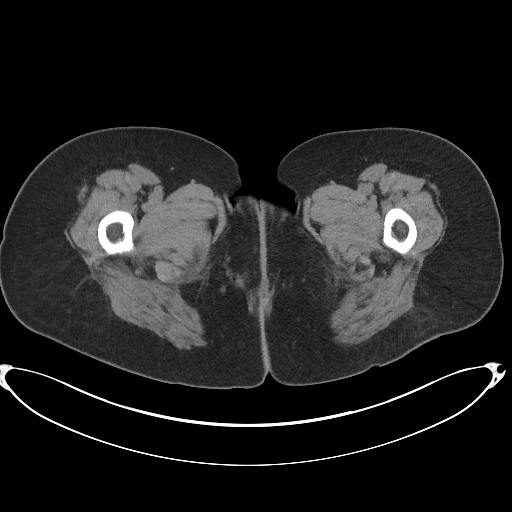
[im 11/56  soft-tissue]
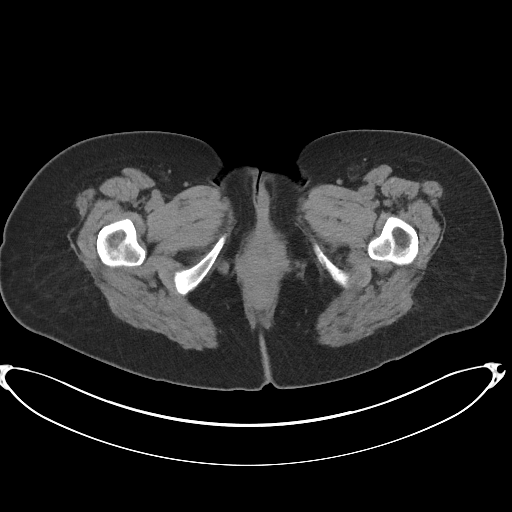
[im 16/56  soft-tissue]
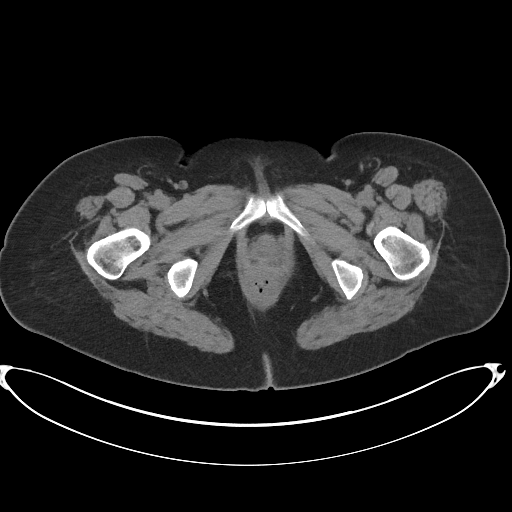
[im 20/56  soft-tissue]
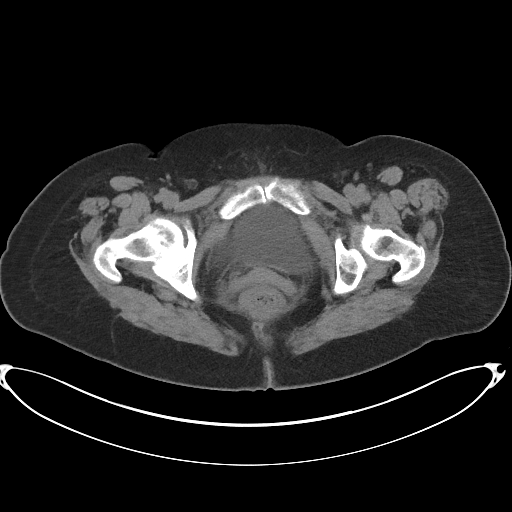
[im 24/56  soft-tissue]
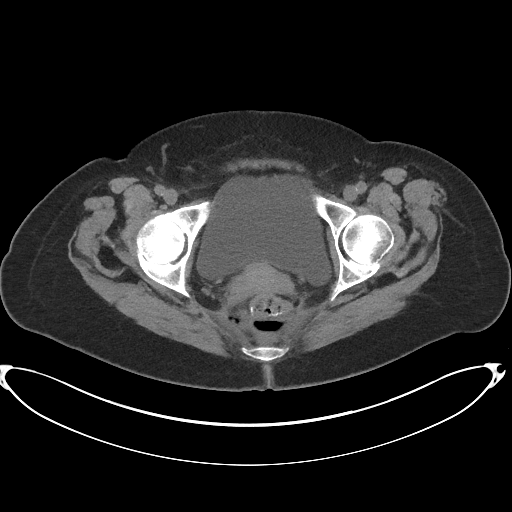
[im 29/56  soft-tissue]
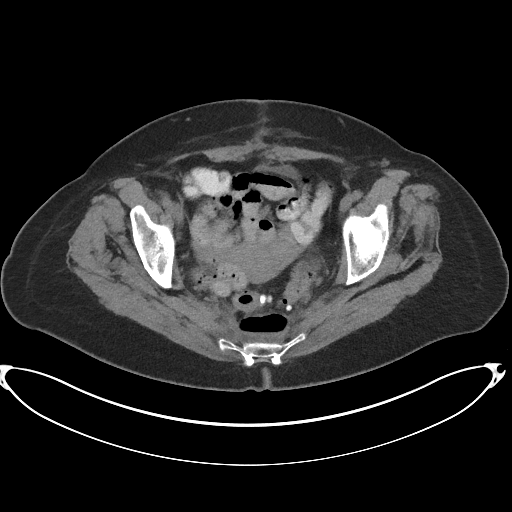
[im 32/56  soft-tissue]
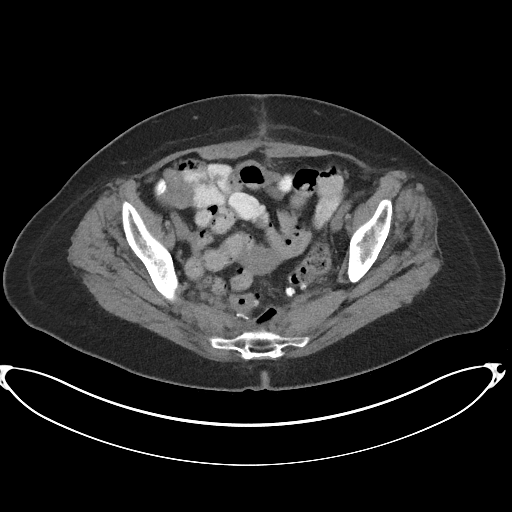
[im 36/56  soft-tissue]
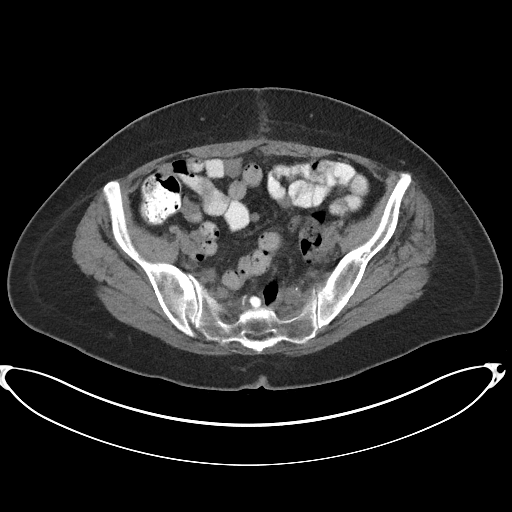
[im 36/56  bone]
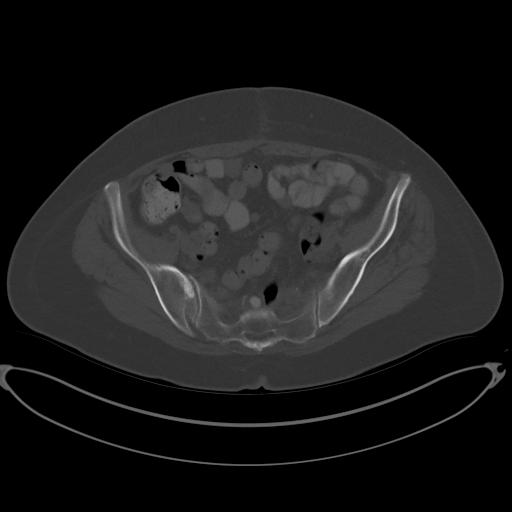
[im 40/56  soft-tissue]
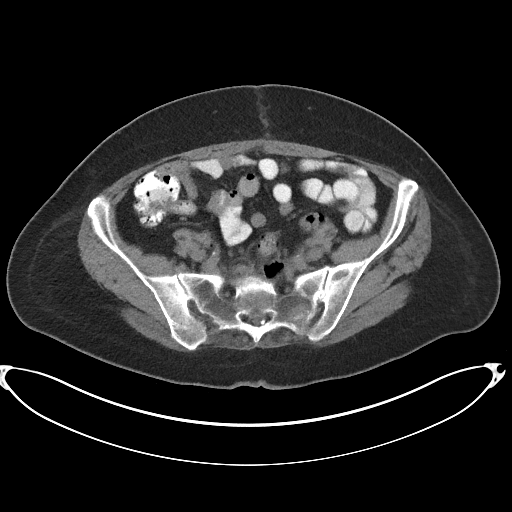
[im 45/56  soft-tissue]
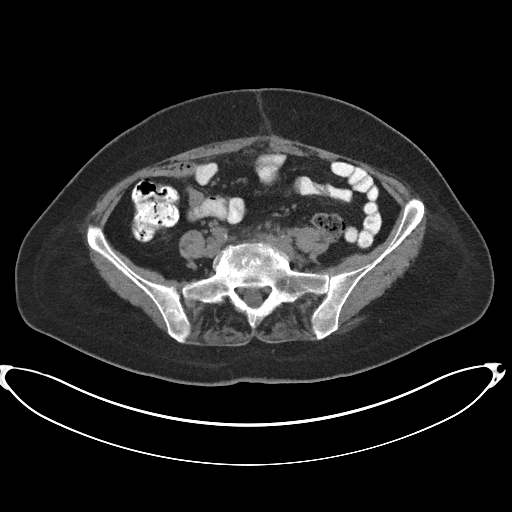
[im 48/56  soft-tissue]
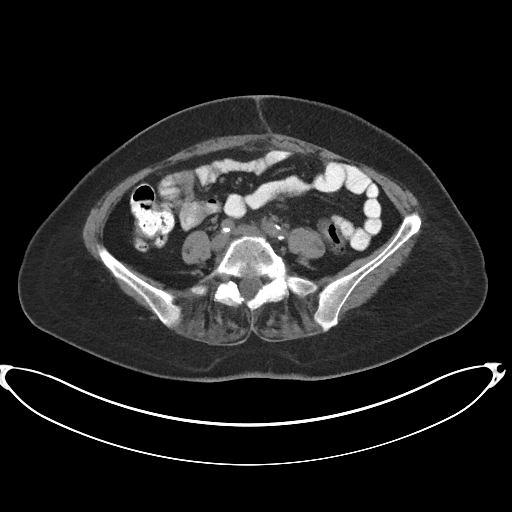
[im 48/56  lung]
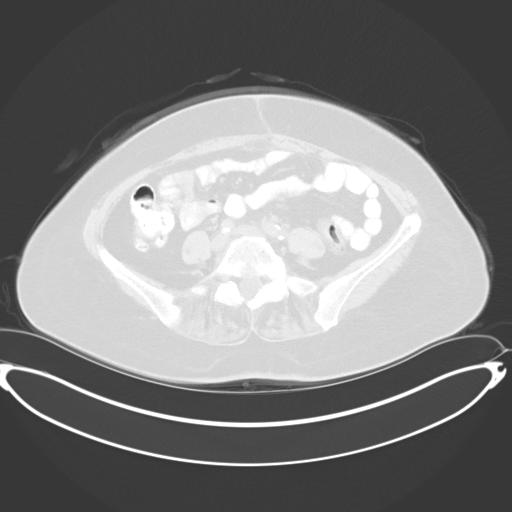
[im 50/56  lung]
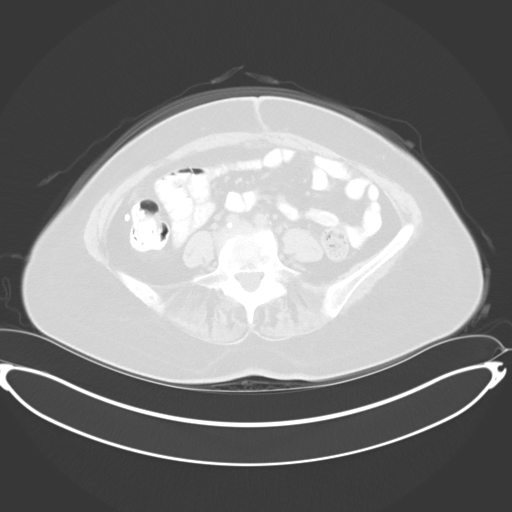
[im 52/56  soft-tissue]
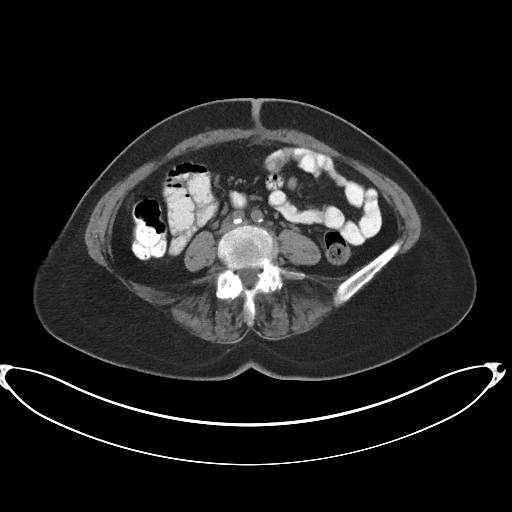
[im 52/56  lung]
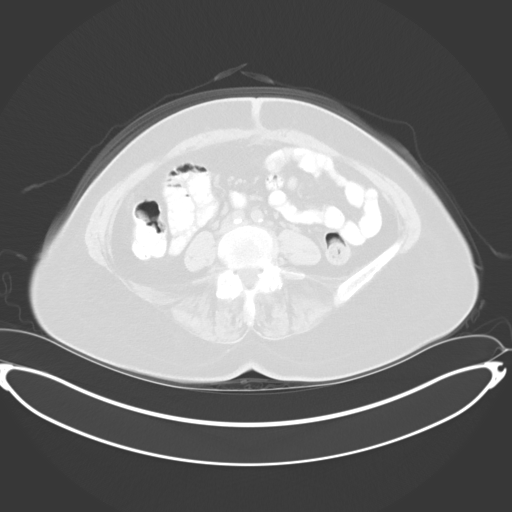
[im 54/56  lung]
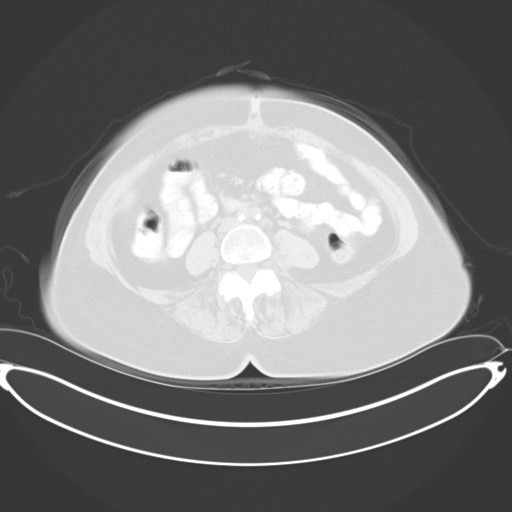

[15 of 32 positions shown; findings below may reference images not displayed]

PROCEDURE:     KCT - KCT PELVIS STANDARD WO  - [DATE]  [DATE]

RESULT:     CT of the pelvis is performed with oral contrast. Images are
reconstructed at 5 mm slice thickness. The oral contrast has not reached the
descending colon in the pelvic region. There is again noted a presacral air
and fluid collection posterior to the rectum. Scattered colonic diverticula
are present. Atherosclerotic disease is present. The urinary bladder is
unremarkable. The uterus is present. The adnexal structures appear within
normal limits. Surgical anastomosis appears to be present from image 32
through image 36. The collection demonstrated previously is not visible at
this time.
IMPRESSION: Persistent presacral fluid collection. This currently measures up to
approximately 6.25 cm transversely with an AP dimension of up to
approximately 2.7 cm on image #31. The connection demonstrated with the
rectum on the previous study is not evident on today's exam. There was no
rectal contrast administered. The oral contrast has not reached the colon at
the time of imaging.

## 2010-04-19 DIAGNOSIS — C20 Malignant neoplasm of rectum: Secondary | ICD-10-CM

## 2010-04-19 HISTORY — PX: BILATERAL OOPHORECTOMY: SHX1221

## 2010-04-19 HISTORY — PX: APPENDECTOMY: SHX54

## 2010-04-19 HISTORY — DX: Malignant neoplasm of rectum: C20

## 2010-05-07 ENCOUNTER — Inpatient Hospital Stay: Payer: Self-pay | Admitting: General Surgery

## 2010-05-07 IMAGING — CT CT GUIDED ABCESS DRAINAGE WITH CATHETER
4 of 5 series · 14 of 32 positions shown, 19 images · non-contrast
Comparison: none

REASON FOR EXAM: drainage retro rectal pelvic abscess/ anastomostc leak.
COMMENTS:

[Series 2: soft tissue · axial · 0.74mm/px · z∈[-109,-74]mm · 3 of 15 slices shown (1 of 3)]
[im 4/15  soft-tissue]
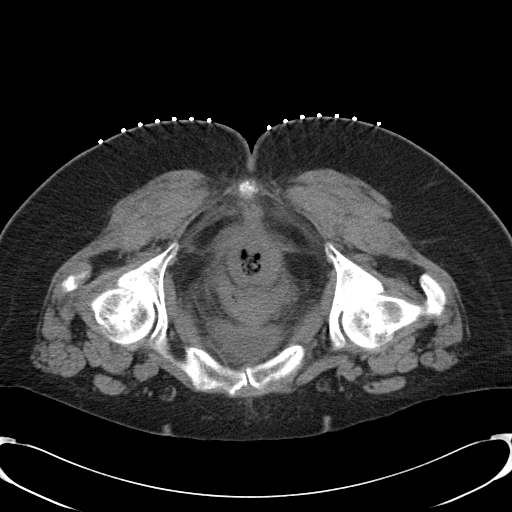
[im 8/15  soft-tissue]
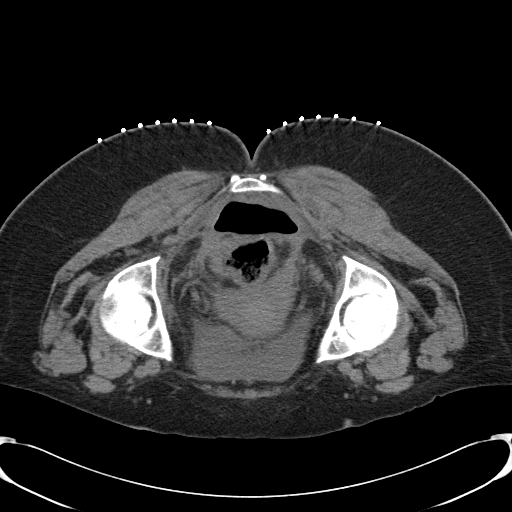
[im 11/15  soft-tissue]
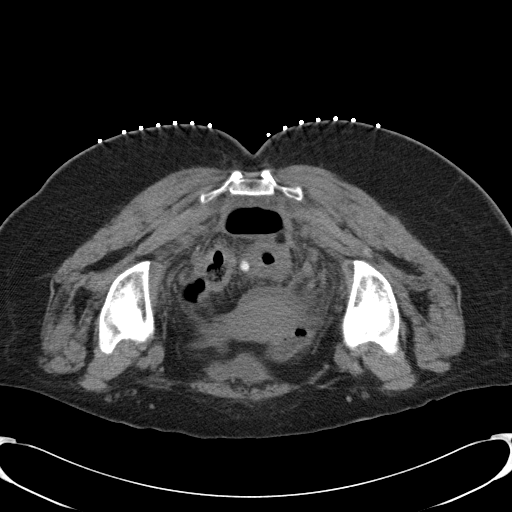

[Series 4: (hospital) 4.8 b30s · axial · 0.74mm/px · z∈[-98,-98]mm · 3 of 38 slices shown]
[im 3/38  soft-tissue]
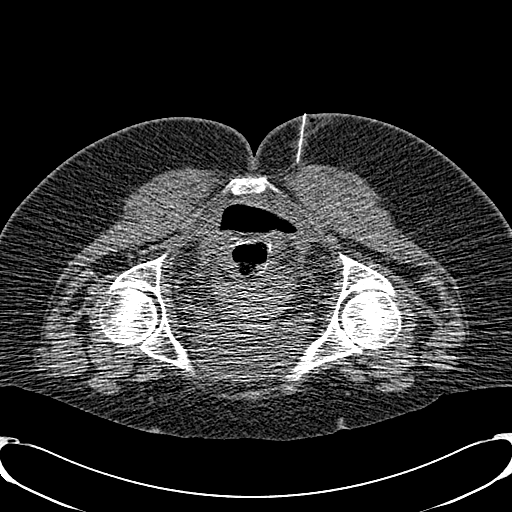
[im 9/38  soft-tissue]
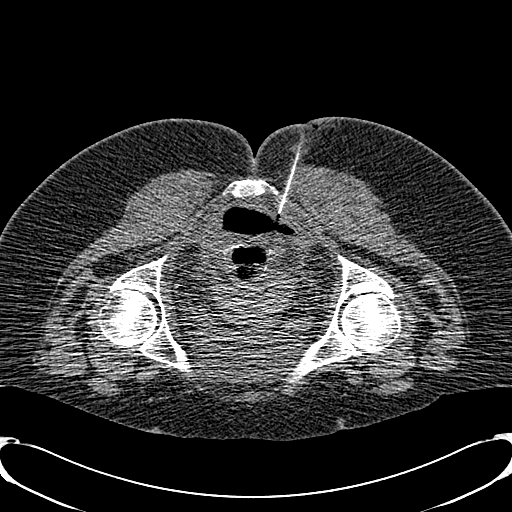
[im 12/38  soft-tissue]
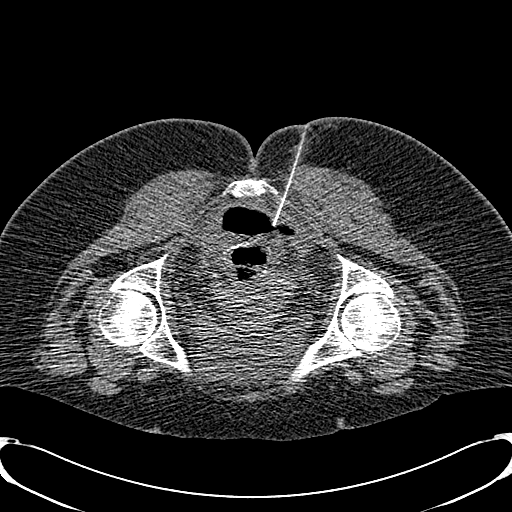

[Series 5: soft tissue · axial · 0.74mm/px · z∈[-111,-66]mm · 4 of 16 slices shown, 9 images (2 of 3)]
[im 4/16  soft-tissue]
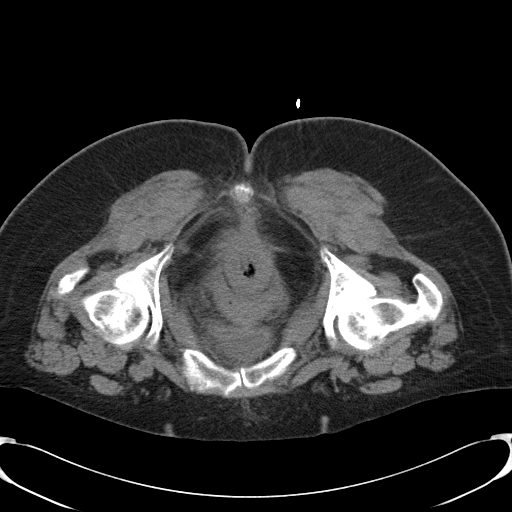
[im 4/16  lung]
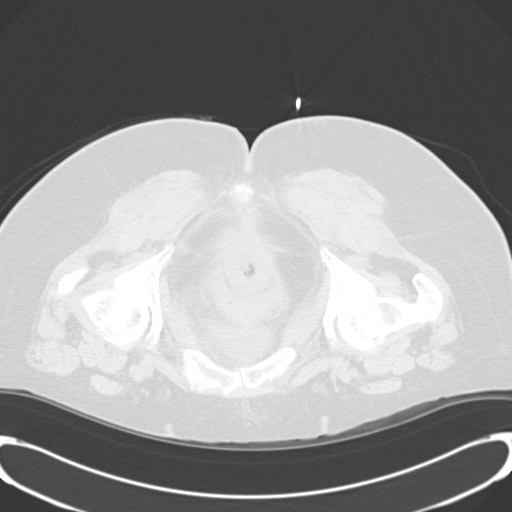
[im 4/16  bone]
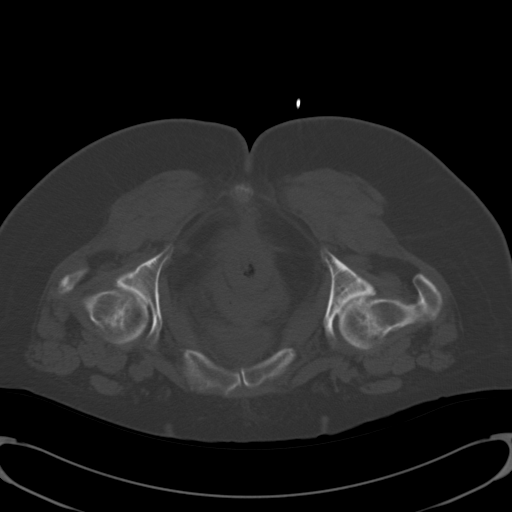
[im 7/16  soft-tissue]
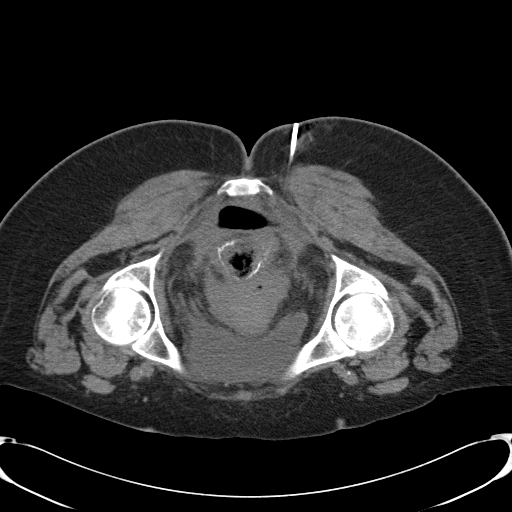
[im 7/16  lung]
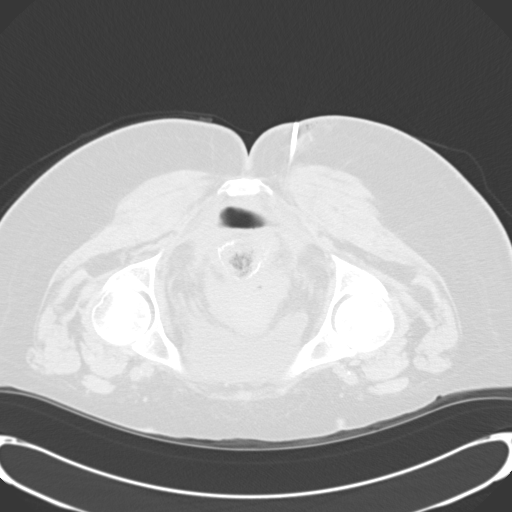
[im 10/16  soft-tissue]
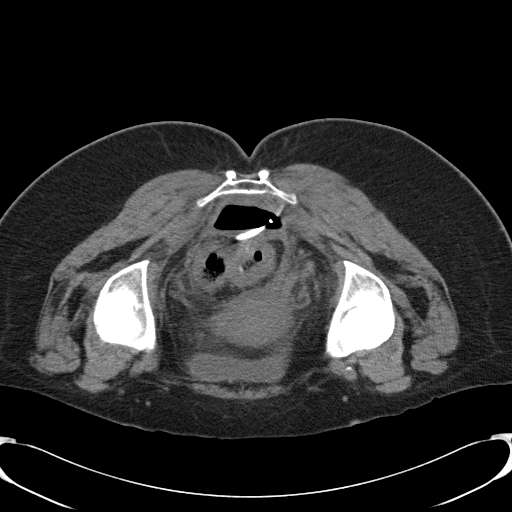
[im 10/16  lung]
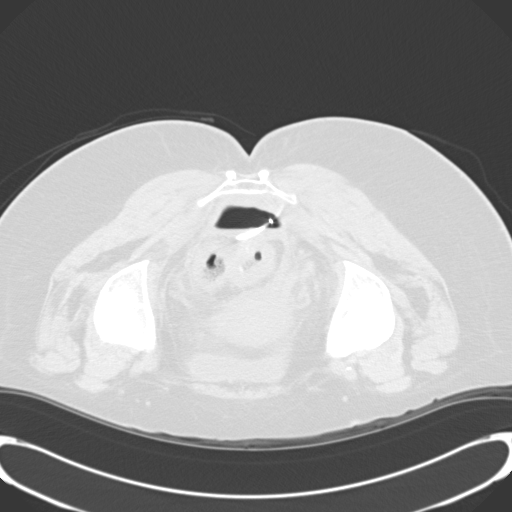
[im 13/16  soft-tissue]
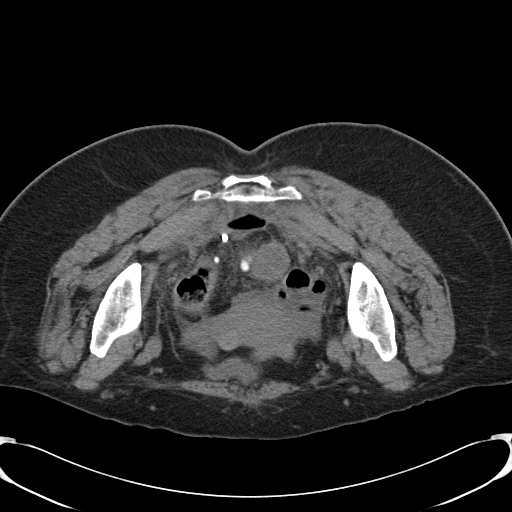
[im 13/16  lung]
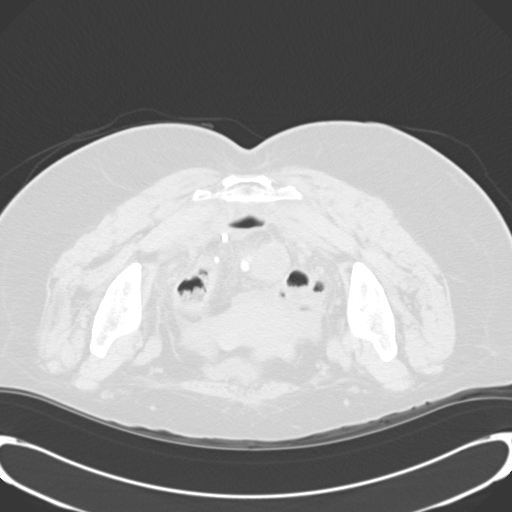

[Series 8: soft tissue · axial · 0.74mm/px · z∈[-111,-66]mm · 4 of 16 slices shown (3 of 3)]
[im 4/16  soft-tissue]
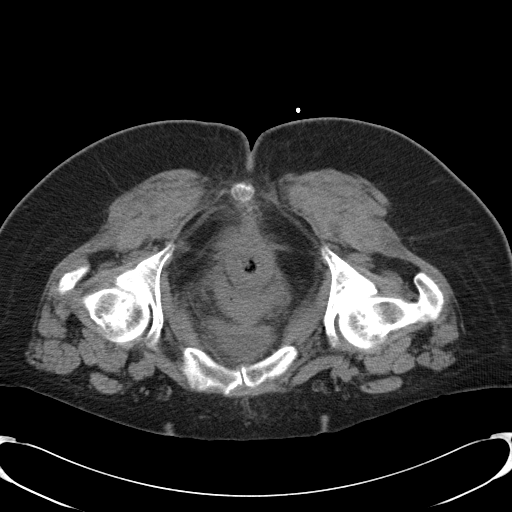
[im 7/16  soft-tissue]
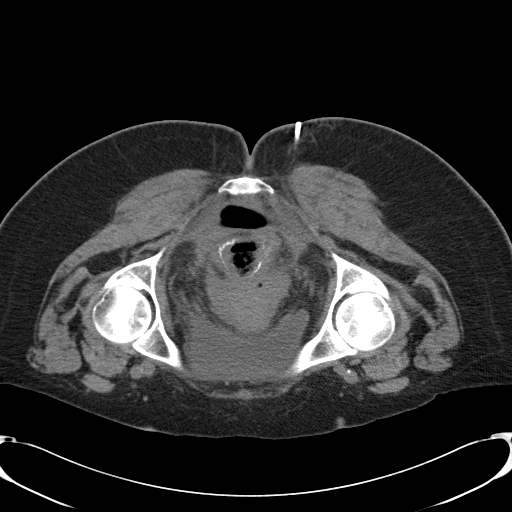
[im 10/16  soft-tissue]
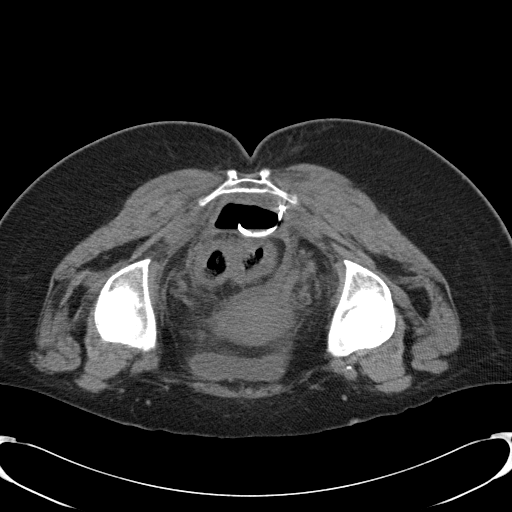
[im 13/16  soft-tissue]
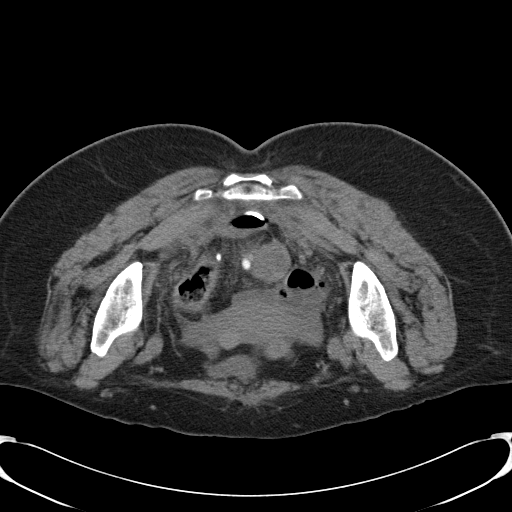

[14 of 32 positions shown; findings below may reference images not displayed]

PROCEDURE:     CT  - CT GUIDED ABSCESS DRAINAGE  - [DATE]  [DATE]

RESULT:     Comparisons: CT of the pelvis [DATE]

Procedure:

Clinical assessment was performed and informed consent obtained.  The
patient was brought to the CT suite and placed supine on the table. A
focused pelvis CT without contrast was performed.

There was a collection of air with a trace amount of fluid in the presacral
space, posterior to the anastomotic suture line. The collection was deemed
amenable to drainage.

The right gluteus was prepped and draped in the usual sterile fashion. The
overlying skin was anesthetized with 1% lidocaine. A 21 gauge micropuncture
needle was inserted and the location confirmed by CT fluoroscopy. The needle
was advanced and placement within the collection confirmed by CT and syringe
aspiration of air.

The 21 gauge needle was exchanged for a AccuStick catheter over a wire. The
dilator was then exchanged for  an 8 French APDL pigtail catheter into the
fluid collection. The catheter was secured to the skin, dressed, and
connected to a drainage bag.

The procedure was well tolerated and without complication. Hemostasis was
achieved. The patient was transferred to the recovery unit in stable
condition.

Sedation: 0.5 mg of midazolam and 0.5 mcg of fentanyl.

Initial scout images demonstrate mild thickening of the distal colon, which
is nonspecific but may represent a colitis.
IMPRESSION: Successful CT-guided placement of an 8 French APDL pigtail catheter into a
presacral collection.

## 2010-05-07 IMAGING — CT CT GUIDED ABCESS DRAINAGE WITH CATHETER
1 series · 15 of 32 positions shown, 19 images · non-contrast
Comparison: none

REASON FOR EXAM: drainage retro rectal pelvic abscess/ anastomostc leak.
COMMENTS:

[Series 2: soft tissue · axial · 0.74mm/px · z∈[-198,+47]mm · 15 of 55 slices shown, 19 images]
[im 4/55  soft-tissue]
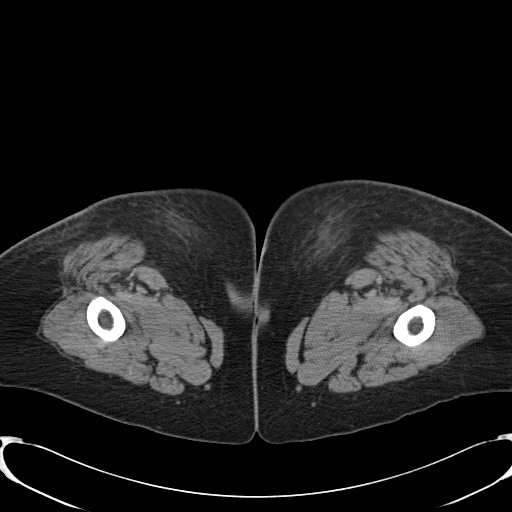
[im 4/55  bone]
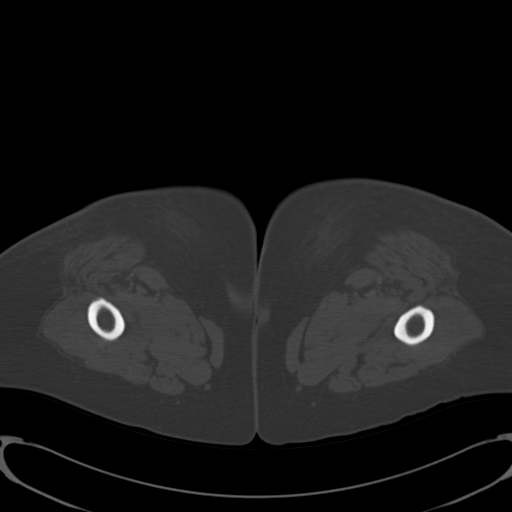
[im 7/55  soft-tissue]
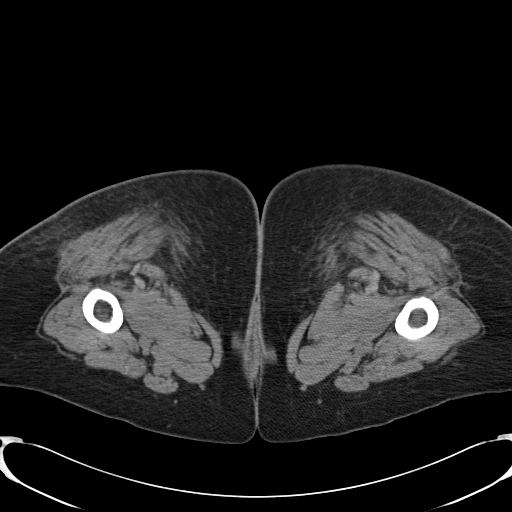
[im 11/55  soft-tissue]
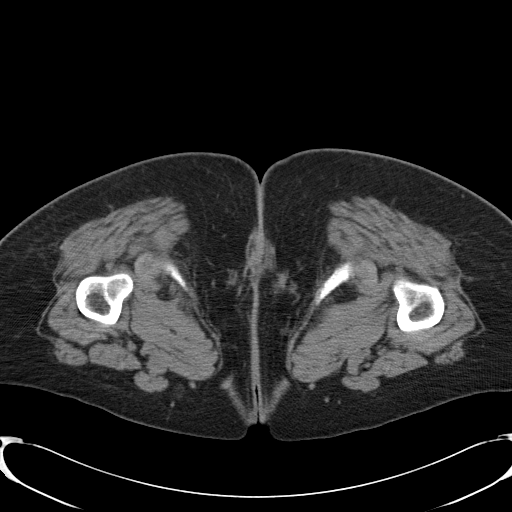
[im 16/55  soft-tissue]
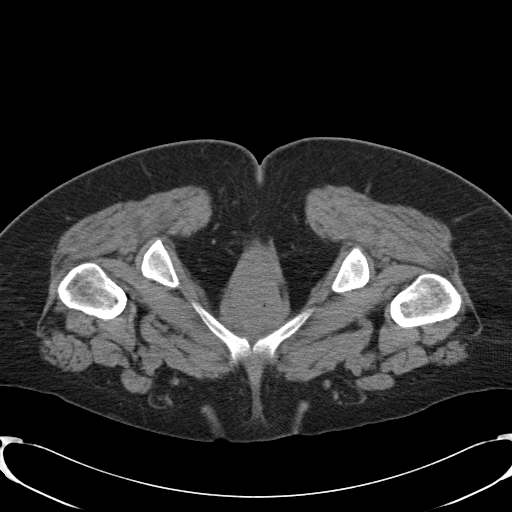
[im 20/55  soft-tissue]
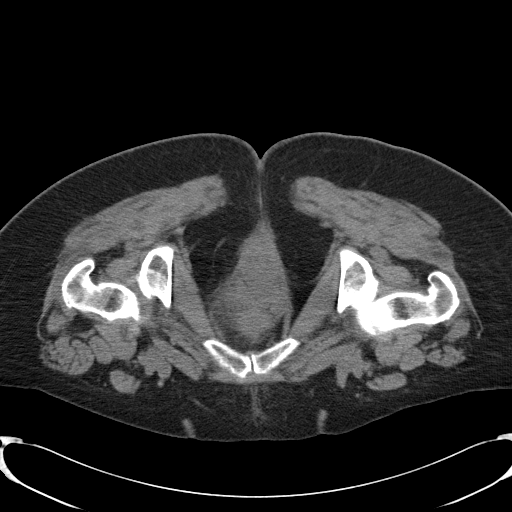
[im 23/55  soft-tissue]
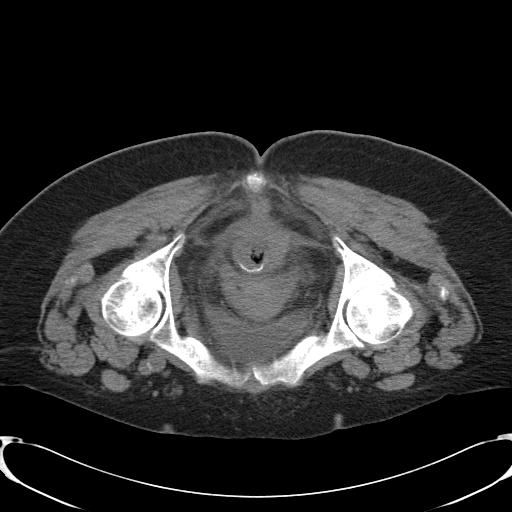
[im 28/55  soft-tissue]
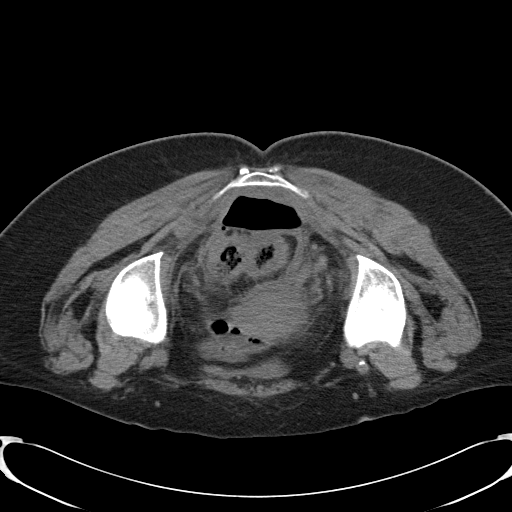
[im 32/55  soft-tissue]
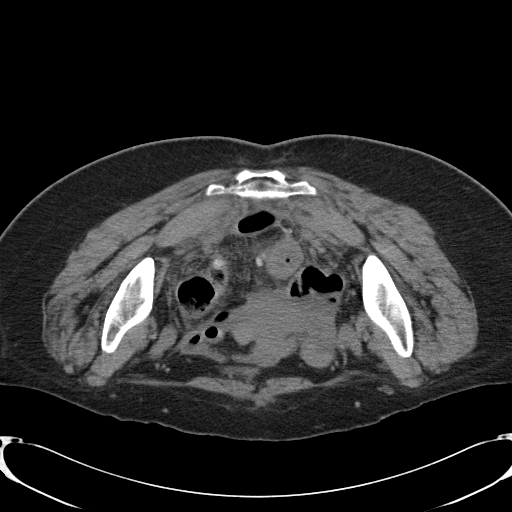
[im 35/55  soft-tissue]
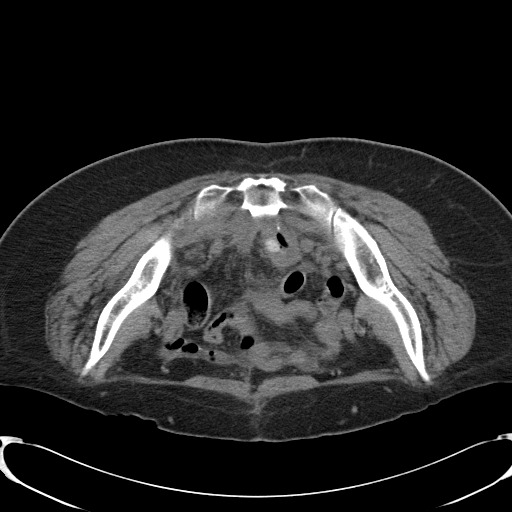
[im 35/55  bone]
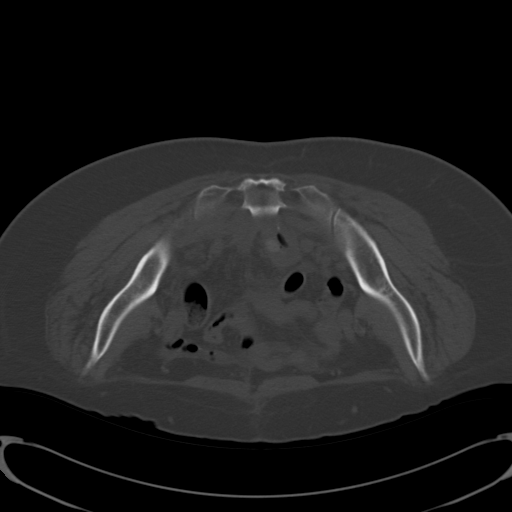
[im 39/55  soft-tissue]
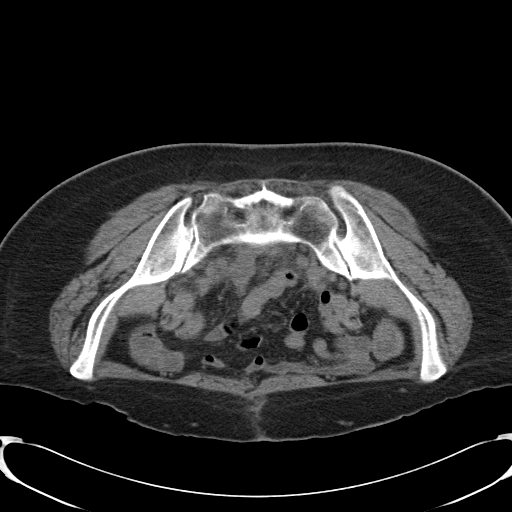
[im 44/55  soft-tissue]
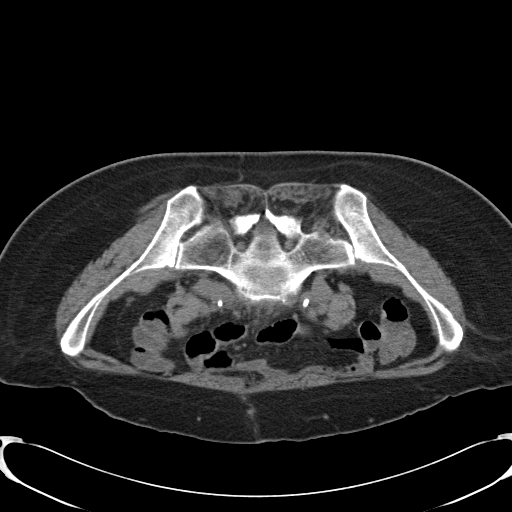
[im 48/55  soft-tissue]
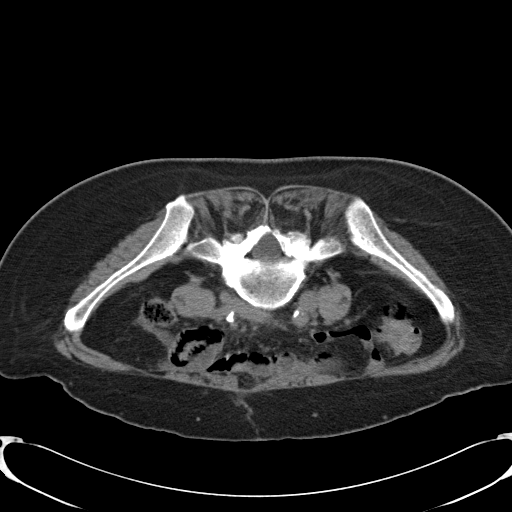
[im 48/55  lung]
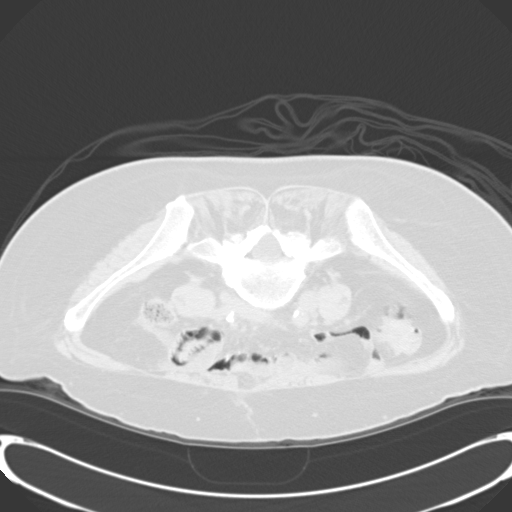
[im 49/55  lung]
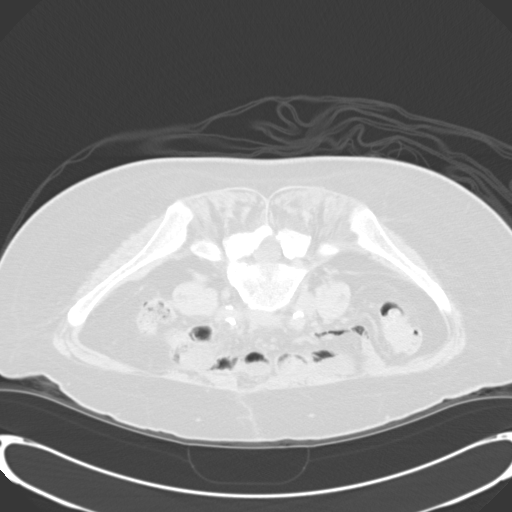
[im 51/55  soft-tissue]
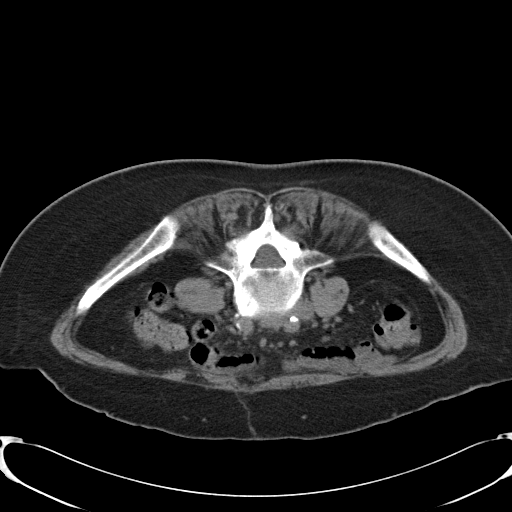
[im 51/55  lung]
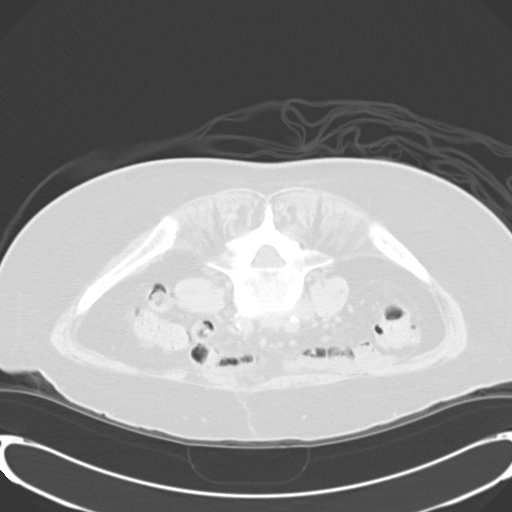
[im 53/55  lung]
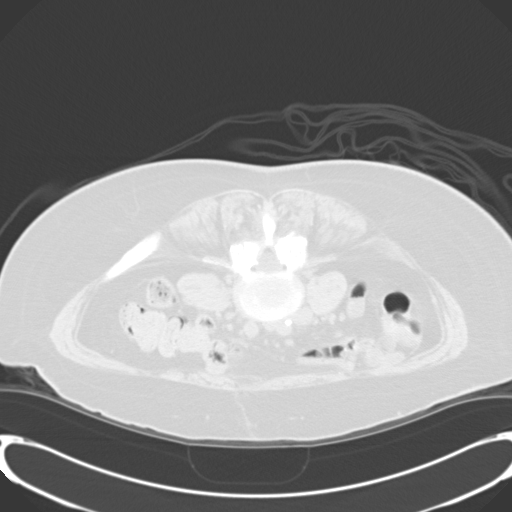

[15 of 32 positions shown; findings below may reference images not displayed]

PROCEDURE:     CT  - CT GUIDED ABSCESS DRAINAGE  - [DATE]  [DATE]

RESULT:     Comparisons: CT of the pelvis [DATE]

Procedure:

Clinical assessment was performed and informed consent obtained.  The
patient was brought to the CT suite and placed supine on the table. A
focused pelvis CT without contrast was performed.

There was a collection of air with a trace amount of fluid in the presacral
space, posterior to the anastomotic suture line. The collection was deemed
amenable to drainage.

The right gluteus was prepped and draped in the usual sterile fashion. The
overlying skin was anesthetized with 1% lidocaine. A 21 gauge micropuncture
needle was inserted and the location confirmed by CT fluoroscopy. The needle
was advanced and placement within the collection confirmed by CT and syringe
aspiration of air.

The 21 gauge needle was exchanged for a AccuStick catheter over a wire. The
dilator was then exchanged for  an 8 French APDL pigtail catheter into the
fluid collection. The catheter was secured to the skin, dressed, and
connected to a drainage bag.

The procedure was well tolerated and without complication. Hemostasis was
achieved. The patient was transferred to the recovery unit in stable
condition.

Sedation: 0.5 mg of midazolam and 0.5 mcg of fentanyl.

Initial scout images demonstrate mild thickening of the distal colon, which
is nonspecific but may represent a colitis.
IMPRESSION: Successful CT-guided placement of an 8 French APDL pigtail catheter into a
presacral collection.

## 2010-05-09 IMAGING — CR PELVIS - 1-2 VIEW
1 series · 2 of 2 positions shown · non-contrast
Comparison: None

REASON FOR EXAM: Drain assessment:: LATERAL PELVIS VIEW and AP. Thanks.
PT to be discharged after
COMMENTS:  AP & Lat Pelvis

PROCEDURE:     DXR - DXR PELVIS WITH OBLIQUES  - [DATE] [DATE]
RESULT:     History: Drain assessment

[Series 1: view not recorded · 0.17mm/px · 2 of 2 slices shown]
[im 1/2]
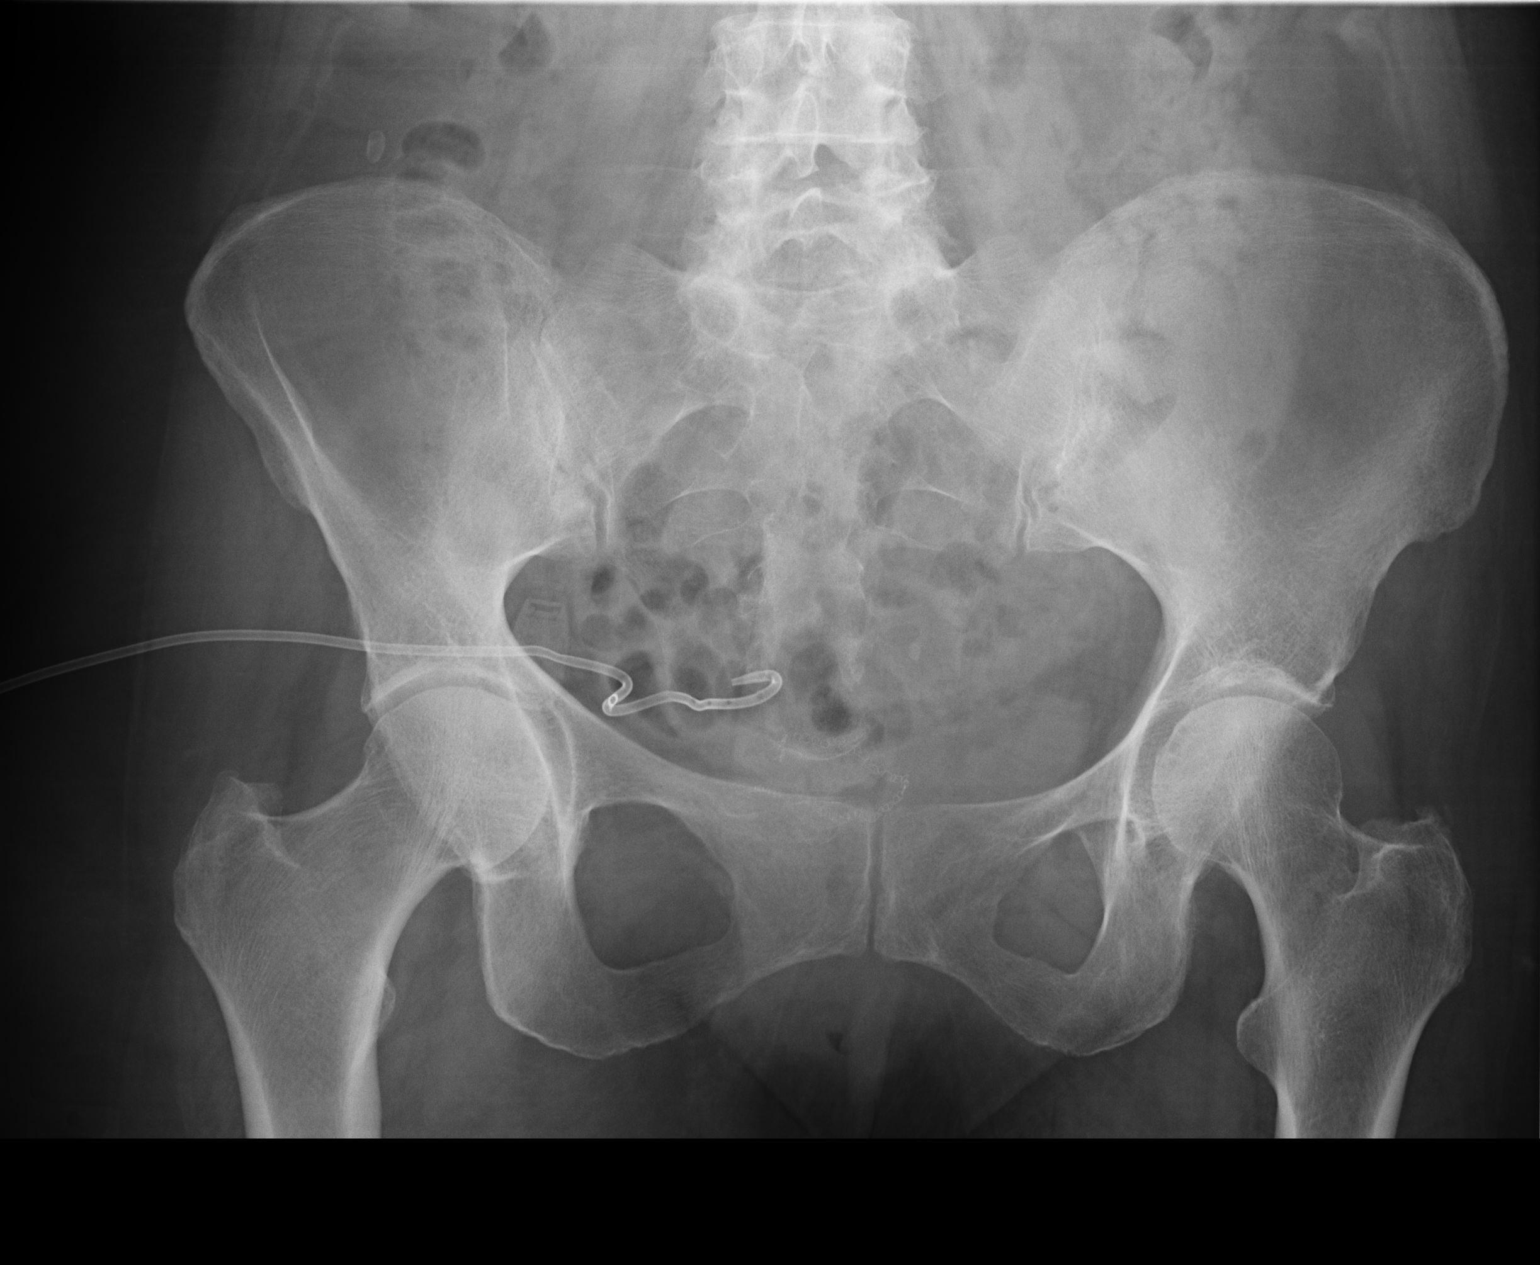
[im 2/2]
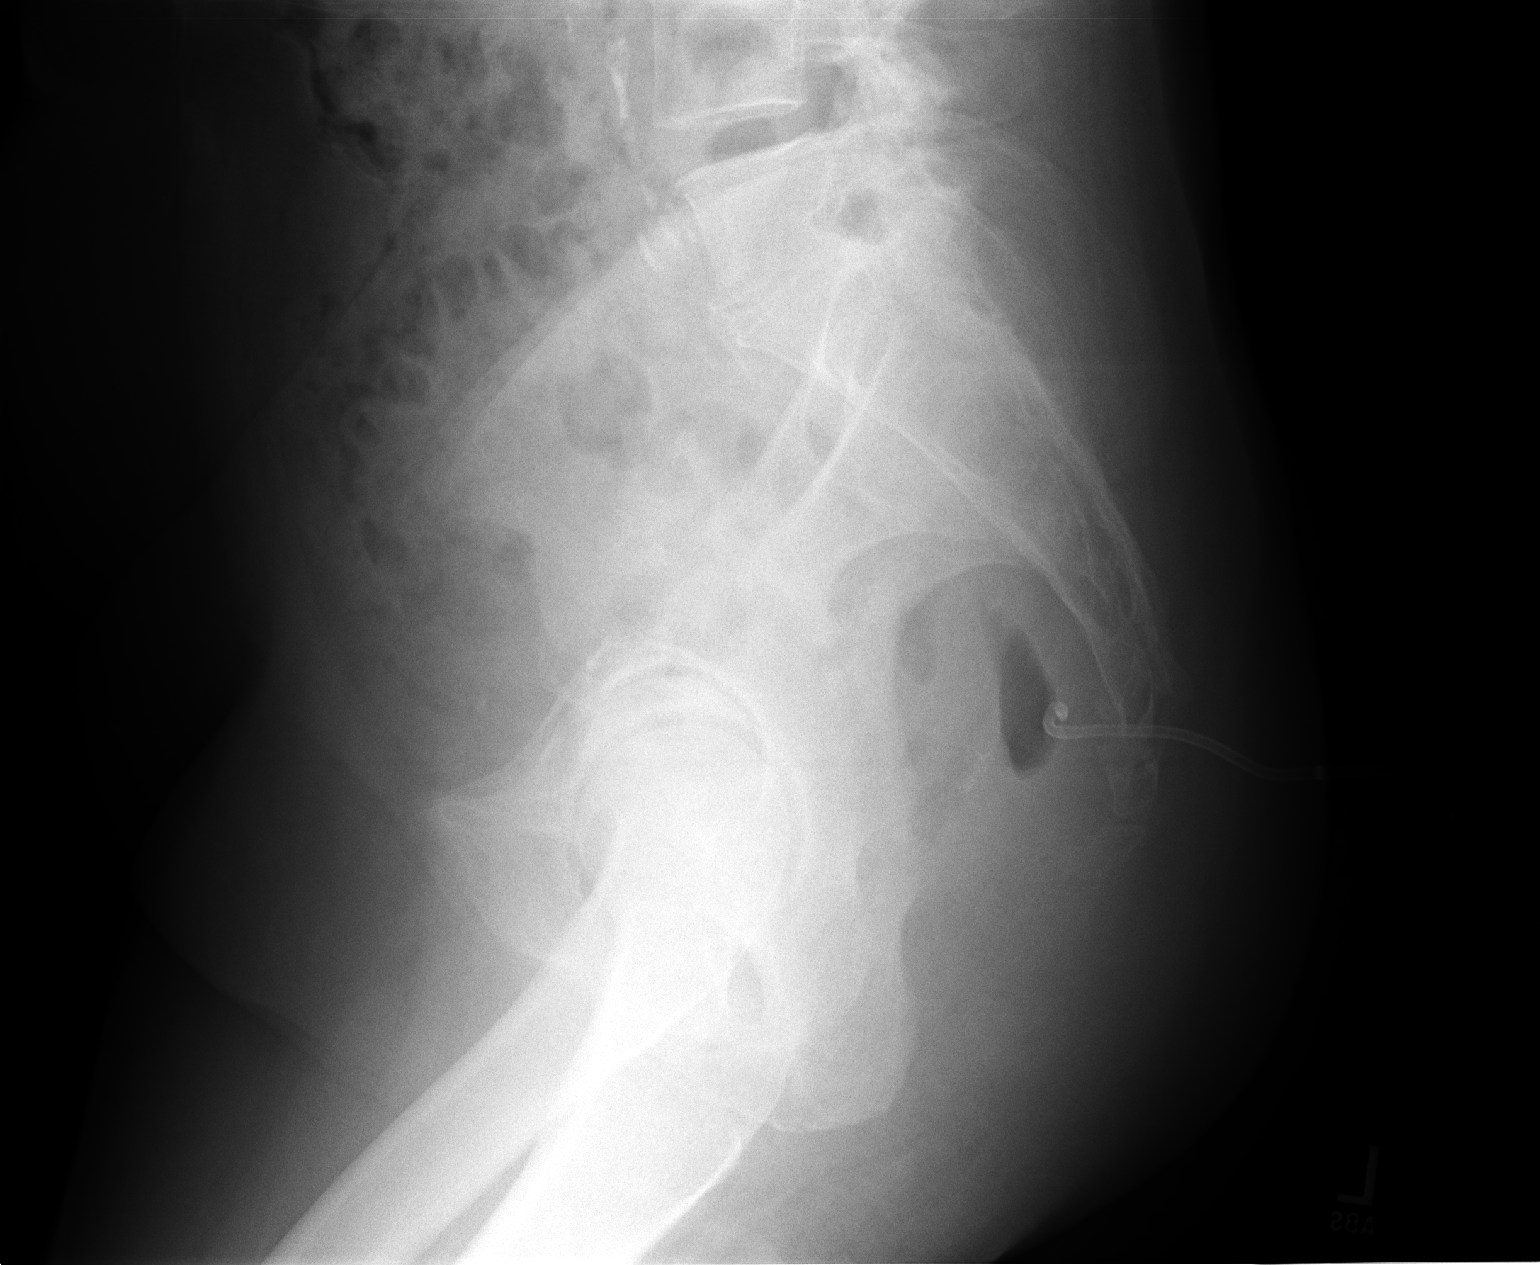

[2 of 2 positions shown; findings below may reference images not displayed]

FINDINGS: AP and lateral views of the pelvis demonstrates no fracture or dislocation.
There is a drain present in the presacral space. The sacroiliac joints are
unremarkable.
IMPRESSION: There is a drain present in the presacral space.

## 2010-05-18 ENCOUNTER — Ambulatory Visit: Payer: Self-pay | Admitting: General Surgery

## 2010-05-20 DIAGNOSIS — R1032 Left lower quadrant pain: Secondary | ICD-10-CM

## 2010-05-20 HISTORY — DX: Left lower quadrant pain: R10.32

## 2011-02-11 ENCOUNTER — Ambulatory Visit: Payer: Self-pay

## 2011-03-18 ENCOUNTER — Ambulatory Visit: Payer: Self-pay | Admitting: General Surgery

## 2011-05-21 DIAGNOSIS — Z85038 Personal history of other malignant neoplasm of large intestine: Secondary | ICD-10-CM

## 2011-05-21 HISTORY — DX: Personal history of other malignant neoplasm of large intestine: Z85.038

## 2012-02-06 ENCOUNTER — Ambulatory Visit: Payer: Self-pay | Admitting: Family Medicine

## 2012-02-17 ENCOUNTER — Ambulatory Visit: Payer: Self-pay

## 2012-10-16 ENCOUNTER — Encounter: Payer: Self-pay | Admitting: *Deleted

## 2012-10-16 DIAGNOSIS — Z85038 Personal history of other malignant neoplasm of large intestine: Secondary | ICD-10-CM | POA: Insufficient documentation

## 2013-03-01 ENCOUNTER — Ambulatory Visit: Payer: Self-pay | Admitting: Family Medicine

## 2013-03-01 IMAGING — MG MM DIGITAL SCREENING BILAT W/ CAD
1 series · 4 of 4 positions shown · non-contrast
Comparison: Previous exam(s).

REASON FOR EXAM: SCR MAMMO NO ORDER
COMMENTS:

PROCEDURE:     MAM - MAM DGTL SCRN MAM NO ORDER W/CAD  - [DATE] [DATE]
CLINICAL DATA: Screening.
EXAM:
DIGITAL SCREENING BILATERAL MAMMOGRAM WITH CAD

[R CC · right · 4 of 4 slices shown]
[im 1/4]
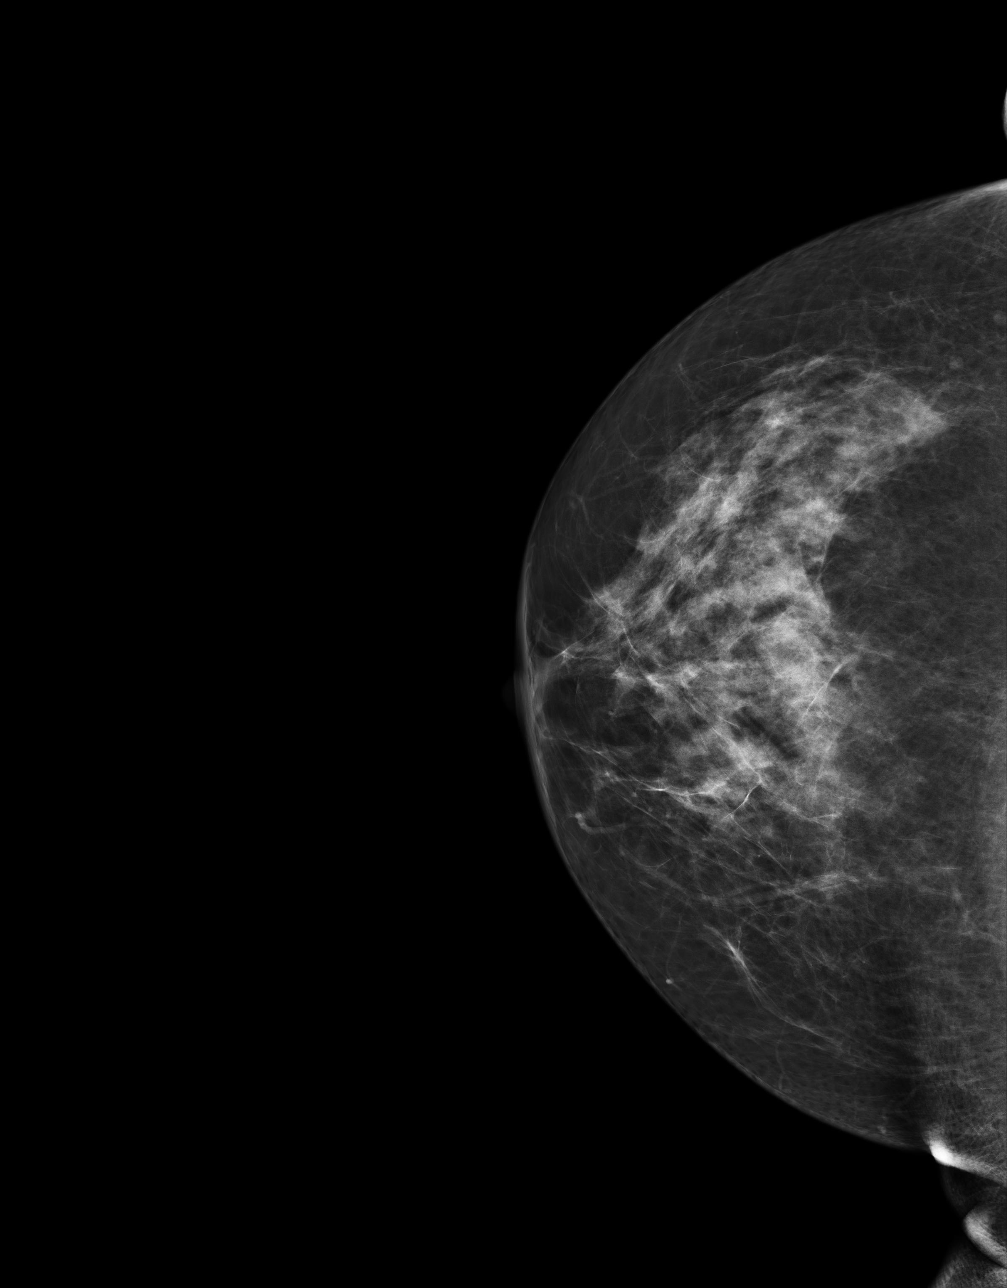
[im 2/4]
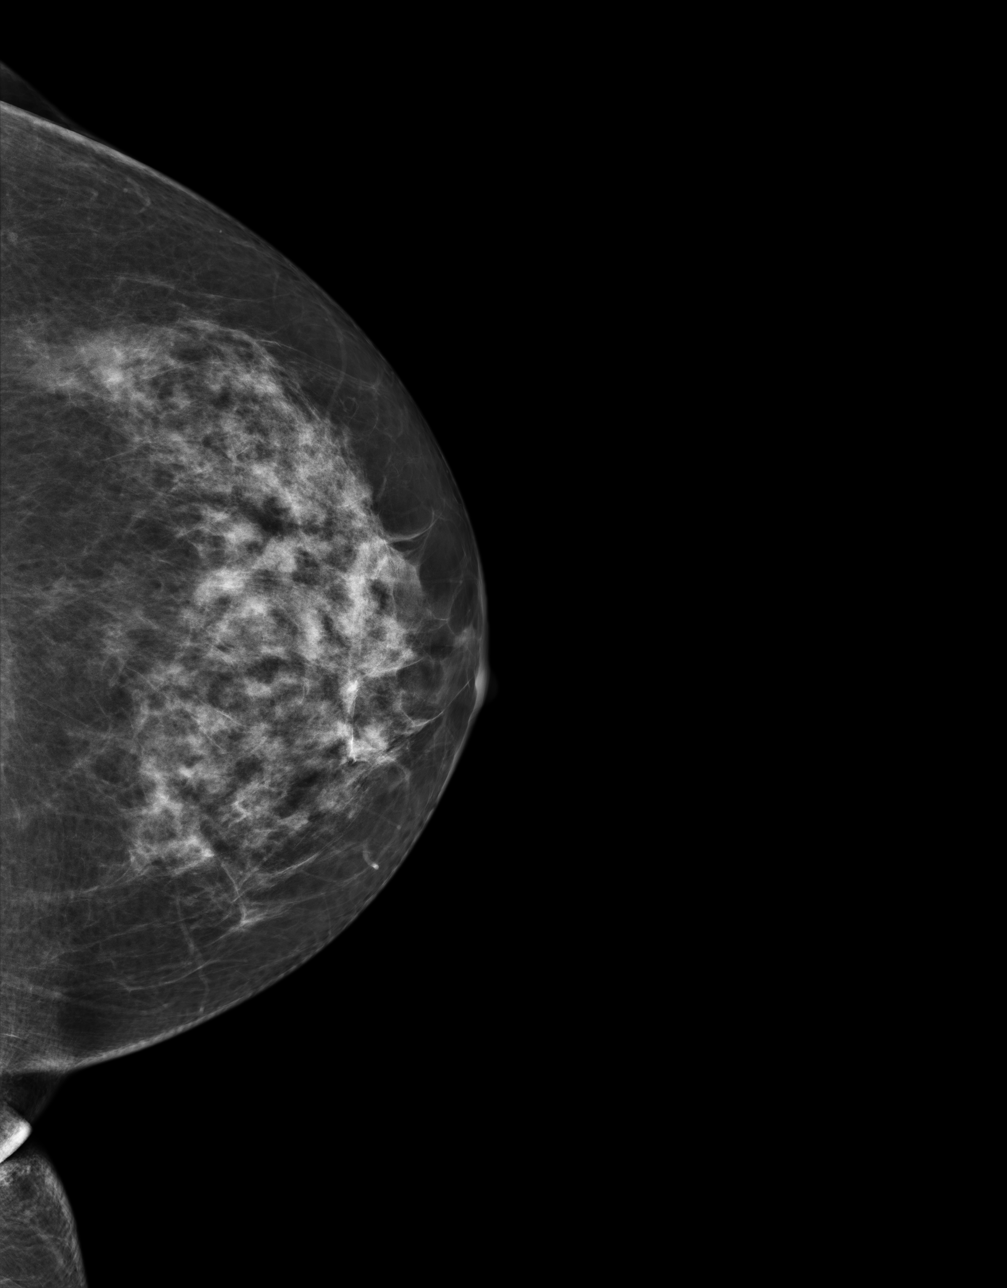
[im 3/4]
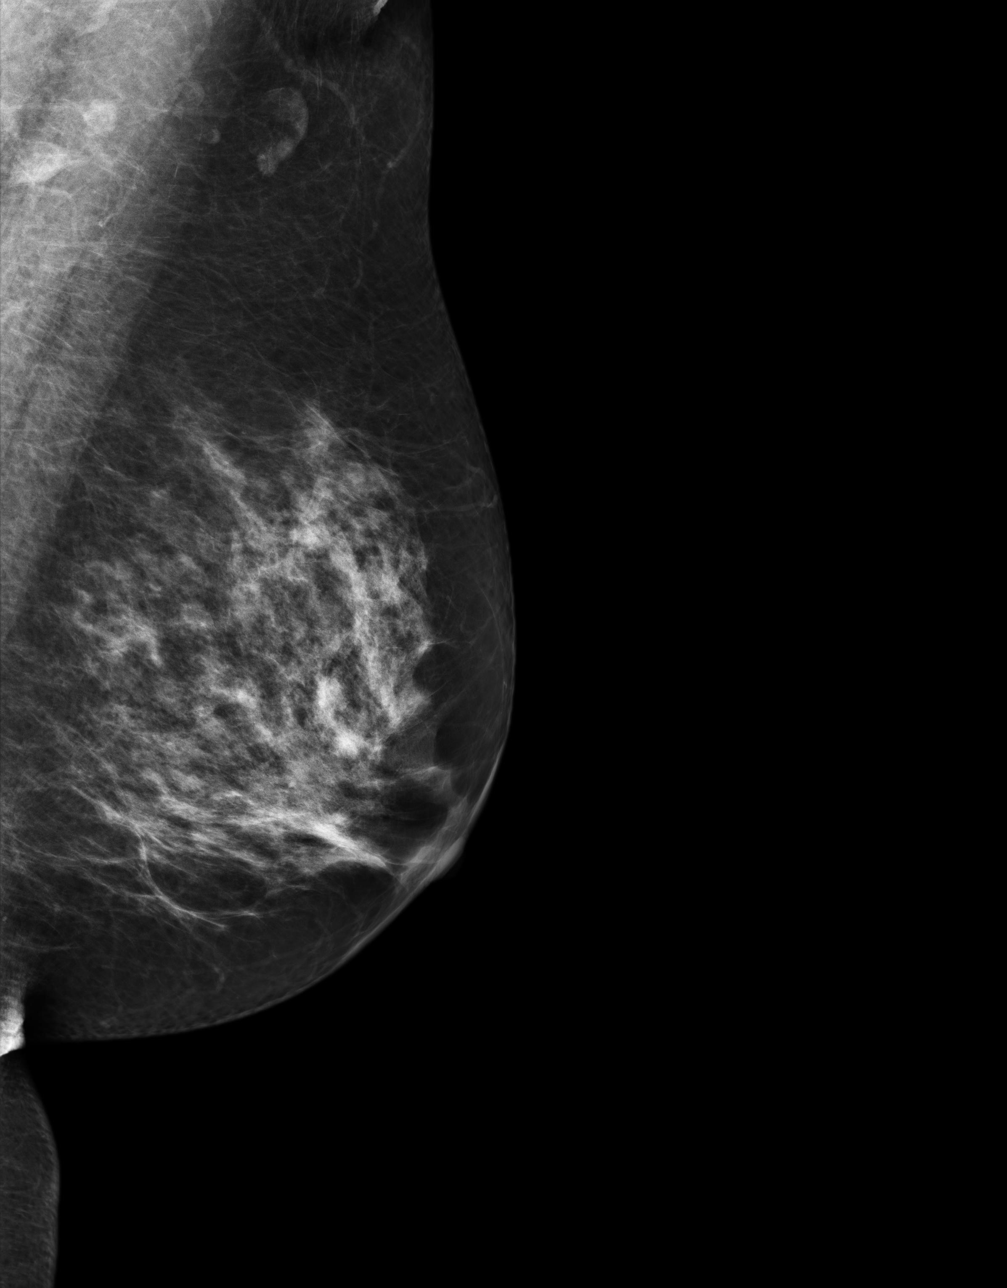
[im 4/4]
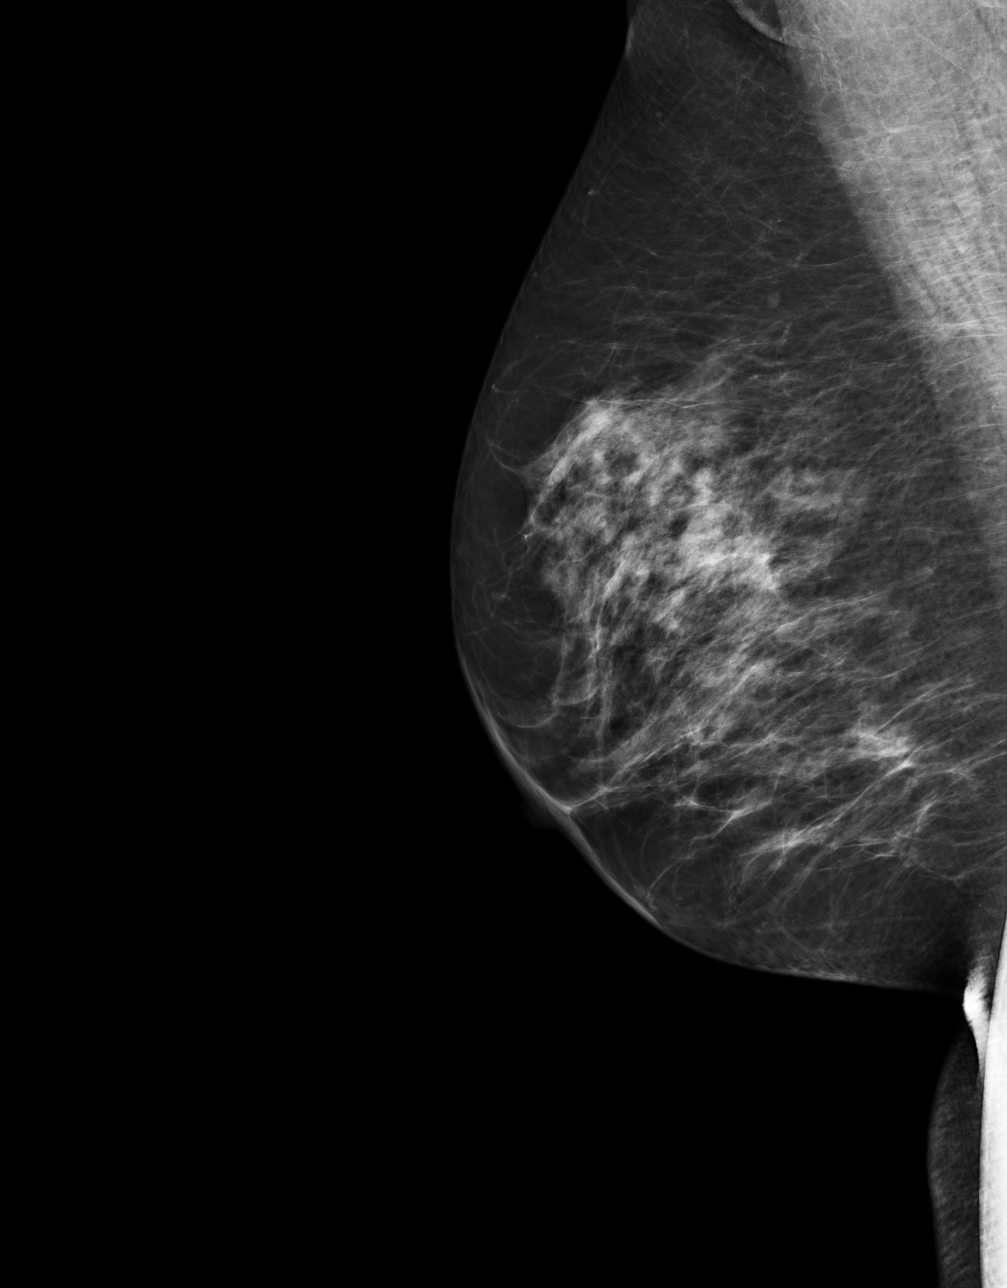

[4 of 4 positions shown; findings below may reference images not displayed]

ACR Breast Density Category c: The breasts are heterogeneously
dense, which may obscure small masses.
FINDINGS: There are no findings suspicious for malignancy. Images were
processed with CAD.
IMPRESSION: No mammographic evidence of malignancy. A result letter of this
screening mammogram will be mailed directly to the patient.

RECOMMENDATION:
Screening mammogram in one year. (Code:[CH])

BI-RADS CATEGORY  1: Negative

## 2013-03-08 ENCOUNTER — Ambulatory Visit (INDEPENDENT_AMBULATORY_CARE_PROVIDER_SITE_OTHER): Payer: Medicare Other | Admitting: General Surgery

## 2013-03-08 ENCOUNTER — Encounter: Payer: Self-pay | Admitting: General Surgery

## 2013-03-08 VITALS — BP 140/80 | HR 74 | Resp 14 | Ht 64.0 in | Wt 181.0 lb

## 2013-03-08 DIAGNOSIS — C189 Malignant neoplasm of colon, unspecified: Secondary | ICD-10-CM

## 2013-03-08 LAB — POC HEMOCCULT BLD/STL (OFFICE/1-CARD/DIAGNOSTIC): Fecal Occult Blood, POC: NEGATIVE

## 2013-03-08 NOTE — Progress Notes (Signed)
Patient ID: Vanessa Reynolds, female   DOB: [birth year], 67 y.o.   MRN: [id number]  Chief Complaint  Patient presents with  . Follow-up    colon cancer    HPI Vanessa Reynolds is a 67 y.o. female here today for her yearly colon cancer check up.  Patient states she is doing well. No new problems. She reports regular bowel movements with to daily use of MiraLax. HPI  Past Medical History  Diagnosis Date  . Cancer 2011    rectal  . Personal history of tobacco use, presenting hazards to health   . H/O cystitis 2011  . Nausea with vomiting   . Acute parametritis and pelvic cellulitis   . Breast screening, unspecified   . Abdominal pain, left lower quadrant 2012  . Obesity, unspecified   . Personal history of malignant neoplasm of large intestine 2013    Past Surgical History  Procedure Laterality Date  . 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 Appendectomy  2011  . Bilateral oophorectomy  2011  . Colonoscopy  [id number]    Dr. Mechele Collin, Dr. Santo Held)    Family History  Problem Relation Age of Onset  . Hypertension Mother     deceased, age 64  . Cancer Father     gastric cancer, deceased, mid 46's    Social History History  Substance Use Topics  . Smoking status: Former Smoker -- 1.00 packs/day for 30 years    Types: Cigarettes  . Smokeless tobacco: Never Used     Comment: quit in 1999  . Alcohol Use: No    No Known Allergies  Current Outpatient Prescriptions  Medication Sig Dispense Refill  . cholecalciferol (VITAMIN D) 1000 UNITS tablet Take 1,000 Units by mouth daily.      . Probiotic Product (PROBIOTIC DAILY PO) Take 1 tablet by mouth daily.       No current facility-administered medications for this visit.    Review of Systems Review of Systems  Constitutional: Negative.   Respiratory: Negative.   Cardiovascular: Negative.   Gastrointestinal: Negative.     Blood pressure  140/80, pulse 74, resp. rate 14, height 5\' 4"  (1.626 m), weight 181 lb (82.101 kg).  Physical Exam Physical Exam  Constitutional: She is oriented to person, place, and time. She appears well-developed and well-nourished.  Eyes: No scleral icterus.  Cardiovascular: Normal rate, regular rhythm and normal heart sounds.   Pulmonary/Chest: Breath sounds normal.  Abdominal: Soft. Bowel sounds are normal.  Genitourinary: Rectal exam shows no mass, no tenderness and anal tone normal. Guaiac negative stool.  Lymphadenopathy:    She has no cervical adenopathy.  Neurological: She is alert and oriented to person, place, and time.  Skin: Skin is warm and dry.    Data Reviewed Stool Hemoccult was negative.  Assessment    Doing well status post low anterior resection for malignancy.    Plan    We'll plan for followup examination one year, with colonoscopy due at that time.    Patient to have the following labs drawn at [facility] lab today: CEA.   She will return in one year for follow up.   Earline Mayotte 03/09/2013, 5:33 PM

## 2013-03-08 NOTE — Patient Instructions (Addendum)
Patient to have labs drawn today and then she will return in one year for follow up.

## 2013-03-09 LAB — CEA: CEA: 1.5 ng/mL (ref 0.0–4.7)

## 2013-03-10 ENCOUNTER — Telehealth: Payer: Self-pay | Admitting: *Deleted

## 2013-03-10 NOTE — Telephone Encounter (Signed)
Message copied by Levada Schilling on Wed Mar 10, 2013  8:40 AM ------      Message from: Pierson, Merrily Pew      Created: Tue Mar 09, 2013  5:35 PM       Please notify the patient of the blood test completed October 28 was normal. Will recheck next year. ------

## 2013-03-10 NOTE — Telephone Encounter (Signed)
Notified patient as instructed, patient pleased. Discussed follow-up appointments, patient agrees  

## 2013-03-17 ENCOUNTER — Encounter: Payer: Self-pay | Admitting: General Surgery

## 2013-03-18 ENCOUNTER — Ambulatory Visit: Payer: Self-pay | Admitting: General Surgery

## 2014-01-31 ENCOUNTER — Other Ambulatory Visit: Payer: Self-pay

## 2014-01-31 DIAGNOSIS — C189 Malignant neoplasm of colon, unspecified: Secondary | ICD-10-CM

## 2014-03-07 ENCOUNTER — Ambulatory Visit: Payer: Self-pay | Admitting: Ophthalmology

## 2014-03-08 LAB — CEA: CEA: 1.5 ng/mL (ref 0.0–4.7)

## 2014-03-14 ENCOUNTER — Ambulatory Visit (INDEPENDENT_AMBULATORY_CARE_PROVIDER_SITE_OTHER): Payer: Medicare Other | Admitting: General Surgery

## 2014-03-14 ENCOUNTER — Other Ambulatory Visit: Payer: Self-pay | Admitting: General Surgery

## 2014-03-14 ENCOUNTER — Ambulatory Visit: Payer: Medicare Other | Admitting: General Surgery

## 2014-03-14 ENCOUNTER — Encounter: Payer: Self-pay | Admitting: General Surgery

## 2014-03-14 VITALS — BP 130/82 | HR 76 | Resp 14 | Ht 64.5 in | Wt 183.0 lb

## 2014-03-14 DIAGNOSIS — C189 Malignant neoplasm of colon, unspecified: Secondary | ICD-10-CM

## 2014-03-14 DIAGNOSIS — C20 Malignant neoplasm of rectum: Secondary | ICD-10-CM | POA: Diagnosis not present

## 2014-03-14 DIAGNOSIS — Z85048 Personal history of other malignant neoplasm of rectum, rectosigmoid junction, and anus: Secondary | ICD-10-CM | POA: Insufficient documentation

## 2014-03-14 MED ORDER — POLYETHYLENE GLYCOL 3350 17 GM/SCOOP PO POWD: ORAL | Status: DC

## 2014-03-14 NOTE — Progress Notes (Signed)
Patient ID: ZANOVIA ROTZ, female   DOB: 05-Jun-1945, 68 y.o.   MRN: [id number]  Chief Complaint  Patient presents with  . Follow-up    colon cancer    HPI Vanessa Reynolds is a 68 y.o. female here today for her one year colon cancer and to discuss colonoscopy. Her last colonoscopy as done in 2012. Patient states she is doing well with no GI problems.   HPI  Past Medical History  Diagnosis Date  . Personal history of tobacco use, presenting hazards to health   . H/O cystitis 2011  . Nausea with vomiting   . Acute parametritis and pelvic cellulitis   . Breast screening, unspecified   . Abdominal pain, left lower quadrant 2012  . Obesity, unspecified   . Personal history of malignant neoplasm of large intestine 2013  . Rectal cancer December 2011    Adenocarcinoma arising in a villous adenoma, PT 1, N0.    Past Surgical History  Procedure Laterality Date  . 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 Bilateral oophorectomy  December 2011    Completed at time of low anterior resection.  . Colonoscopy  [id number]    Dr. Vira Agar, Dr. Addison Lank)  . Appendectomy  December 2011    Family History  Problem Relation Age of Onset  . Hypertension Mother     deceased, age 16  . Cancer Father     gastric cancer, deceased, mid 82's    Social History History  Substance Use Topics  . Smoking status: Former Smoker -- 1.00 packs/day for 30 years    Types: Cigarettes  . Smokeless tobacco: Never Used     Comment: quit in 1999  . Alcohol Use: No    No Known Allergies  Current Outpatient Prescriptions  Medication Sig Dispense Refill  . cholecalciferol (VITAMIN D) 1000 UNITS tablet Take 1,000 Units by mouth daily.      Marland Kitchen lisinopril (PRINIVIL,ZESTRIL) 20 MG tablet       . lovastatin (MEVACOR) 10 MG tablet       . polyethylene glycol (MIRALAX / GLYCOLAX) packet Take 17 g by mouth every other day.      .  Probiotic Product (PROBIOTIC DAILY PO) Take 1 tablet by mouth daily.      . polyethylene glycol powder (GLYCOLAX/MIRALAX) powder 255 grams one bottle for colonoscopy prep  255 g  0   No current facility-administered medications for this visit.    Review of Systems Review of Systems  Constitutional: Negative.   Respiratory: Negative.   Cardiovascular: Negative.   Gastrointestinal: Negative.     Blood pressure 130/82, pulse 76, resp. rate 14, height 5' 4.5" (1.638 m), weight 183 lb (83.008 kg).  Physical Exam Physical Exam  Constitutional: She is oriented to person, place, and time. She appears well-developed and well-nourished.  Cardiovascular: Normal rate, regular rhythm and normal heart sounds.   No murmur heard. No edema present  Pulmonary/Chest: Effort normal and breath sounds normal.  Abdominal: Soft. Normal appearance and bowel sounds are normal. There is no hepatosplenomegaly. There is no tenderness. No hernia.  Neurological: She is alert and oriented to person, place, and time.  Skin: Skin is warm and dry.    Data Reviewed CEA completed earlier this month was 1.5 ng/dL, unchanged from 2014.  03/18/2011 colonoscopy showed diverticulosis. End to side anastomosis. Assessment    Rectal cancer, no evidence of recurrent disease.  Plan    Indications for follow-up colonoscopy were reviewed.   Patient has been scheduled for a colonoscopy on 04-20-14 at [facility].     PCP: Vanessa Riser., Vanessa Reynolds 03/14/2014, 11:49 AM

## 2014-03-14 NOTE — Patient Instructions (Addendum)
Colonoscopy A colonoscopy is an exam to look at the entire large intestine (colon). This exam can help find problems such as tumors, polyps, inflammation, and areas of bleeding. The exam takes about 1 hour.  [facility] CARE PROVIDER KNOW ABOUT:   Any allergies you have.  All medicines you are taking, including vitamins, herbs, eye drops, creams, and over-the-counter medicines.  Previous problems you or members of your family have had with the use of anesthetics.  Any blood disorders you have.  Previous surgeries you have had.  Medical conditions you have. RISKS AND COMPLICATIONS  Generally, this is a safe procedure. However, as with any procedure, complications can occur. Possible complications include:  Bleeding.  Tearing or rupture of the colon wall.  Reaction to medicines given during the exam.  Infection (rare). BEFORE THE PROCEDURE   Ask your health care provider about changing or stopping your regular medicines.  You may be prescribed an oral bowel prep. This involves drinking a large amount of medicated liquid, starting the day before your procedure. The liquid will cause you to have multiple loose stools until your stool is almost clear or light green. This cleans out your colon in preparation for the procedure.  Do not eat or drink anything else once you have started the bowel prep, unless your health care provider tells you it is safe to do so.  Arrange for someone to drive you home after the procedure. PROCEDURE   You will be given medicine to help you relax (sedative).  You will lie on your side with your knees bent.  A long, flexible tube with a light and camera on the end (colonoscope) will be inserted through the rectum and into the colon. The camera sends video back to a computer screen as it moves through the colon. The colonoscope also releases carbon dioxide gas to inflate the colon. This helps your health care provider see the area better.  During  the exam, your health care provider may take a small tissue sample (biopsy) to be examined under a microscope if any abnormalities are found.  The exam is finished when the entire colon has been viewed. AFTER THE PROCEDURE   Do not drive for 24 hours after the exam.  You may have a small amount of blood in your stool.  You may pass moderate amounts of gas and have mild abdominal cramping or bloating. This is caused by the gas used to inflate your colon during the exam.  Ask when your test results will be ready and how you will get your results. Make sure you get your test results. Document Released: 05/03/2000 Document Revised: 02/24/2013 Document Reviewed: 01/11/2013 [facility] Patient Information 2015 [place], [place]. This information is not intended to replace advice given to you by your health care provider. Make sure you discuss any questions you have with your health care provider.  Patient has been scheduled for a colonoscopy on 04-20-14 at [facility].

## 2014-03-21 ENCOUNTER — Encounter: Payer: Self-pay | Admitting: General Surgery

## 2014-03-22 ENCOUNTER — Ambulatory Visit: Payer: Self-pay | Admitting: Ophthalmology

## 2014-04-12 ENCOUNTER — Ambulatory Visit: Payer: Self-pay | Admitting: Ophthalmology

## 2014-04-13 ENCOUNTER — Telehealth: Payer: Self-pay | Admitting: *Deleted

## 2014-04-13 NOTE — Telephone Encounter (Signed)
Patient confirms no medication changes since last office visit. She reports she has pre-registered and has Miralax prescription.  We will proceed with colonoscopy that is scheduled at [facility] for 04-20-14.

## 2014-04-20 ENCOUNTER — Encounter: Payer: Self-pay | Admitting: General Surgery

## 2014-04-20 ENCOUNTER — Ambulatory Visit: Payer: Self-pay | Admitting: General Surgery

## 2014-04-20 DIAGNOSIS — Z1211 Encounter for screening for malignant neoplasm of colon: Secondary | ICD-10-CM | POA: Diagnosis not present

## 2014-04-20 DIAGNOSIS — C189 Malignant neoplasm of colon, unspecified: Secondary | ICD-10-CM | POA: Diagnosis not present

## 2014-04-25 ENCOUNTER — Ambulatory Visit: Payer: Self-pay | Admitting: Family Medicine

## 2014-04-25 IMAGING — MG MM DIGITAL SCREENING BILAT W/ CAD
1 series · 4 of 4 positions shown · non-contrast
Comparison: Previous Exam(s)

CLINICAL DATA: Screening.

EXAM:
DIGITAL SCREENING BILATERAL MAMMOGRAM WITH CAD

[R CC · right · 4 of 4 slices shown]
[im 1/4]
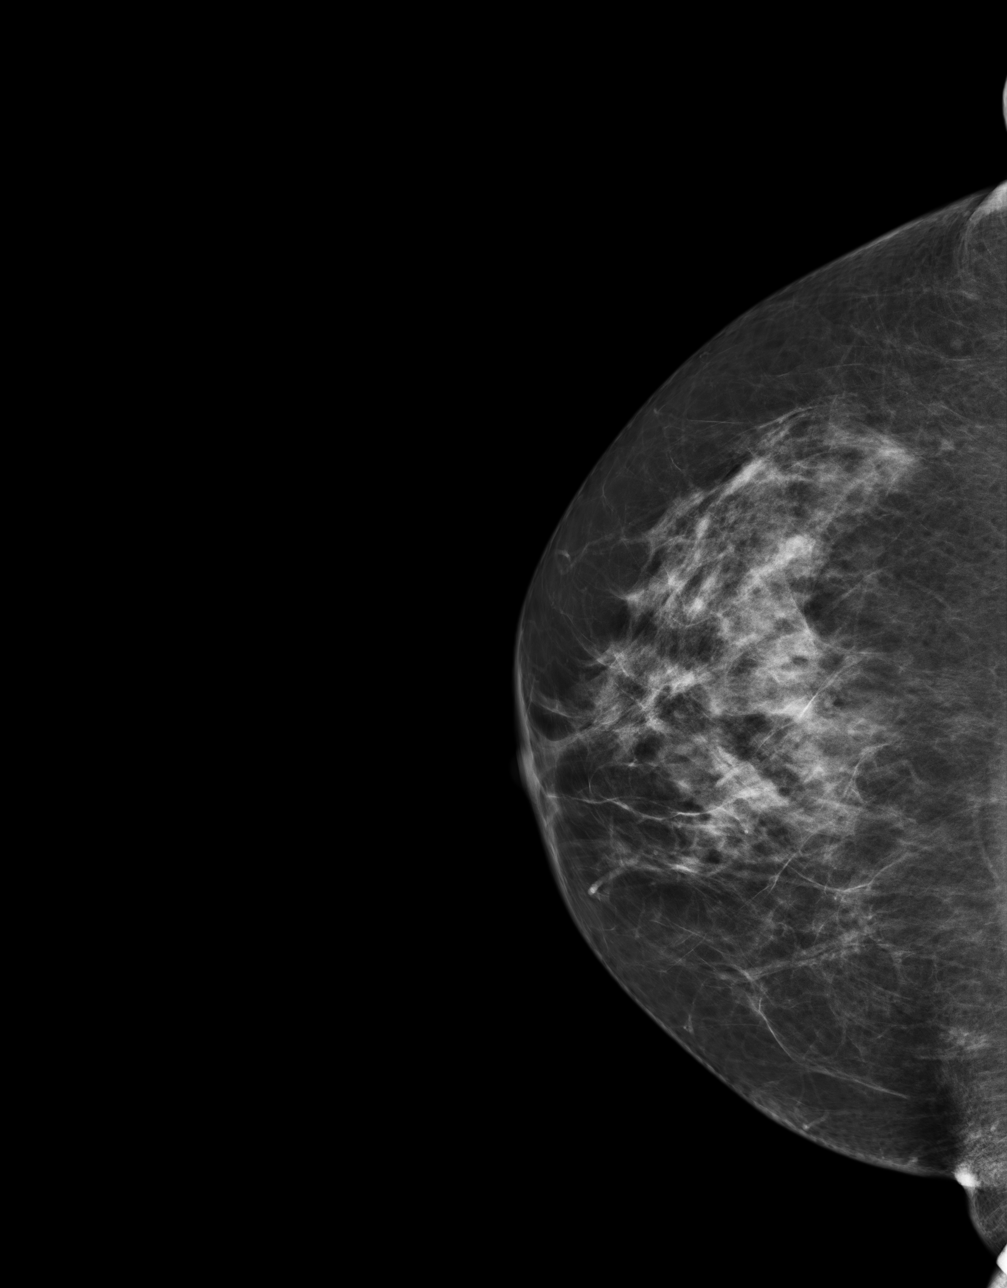
[im 2/4]
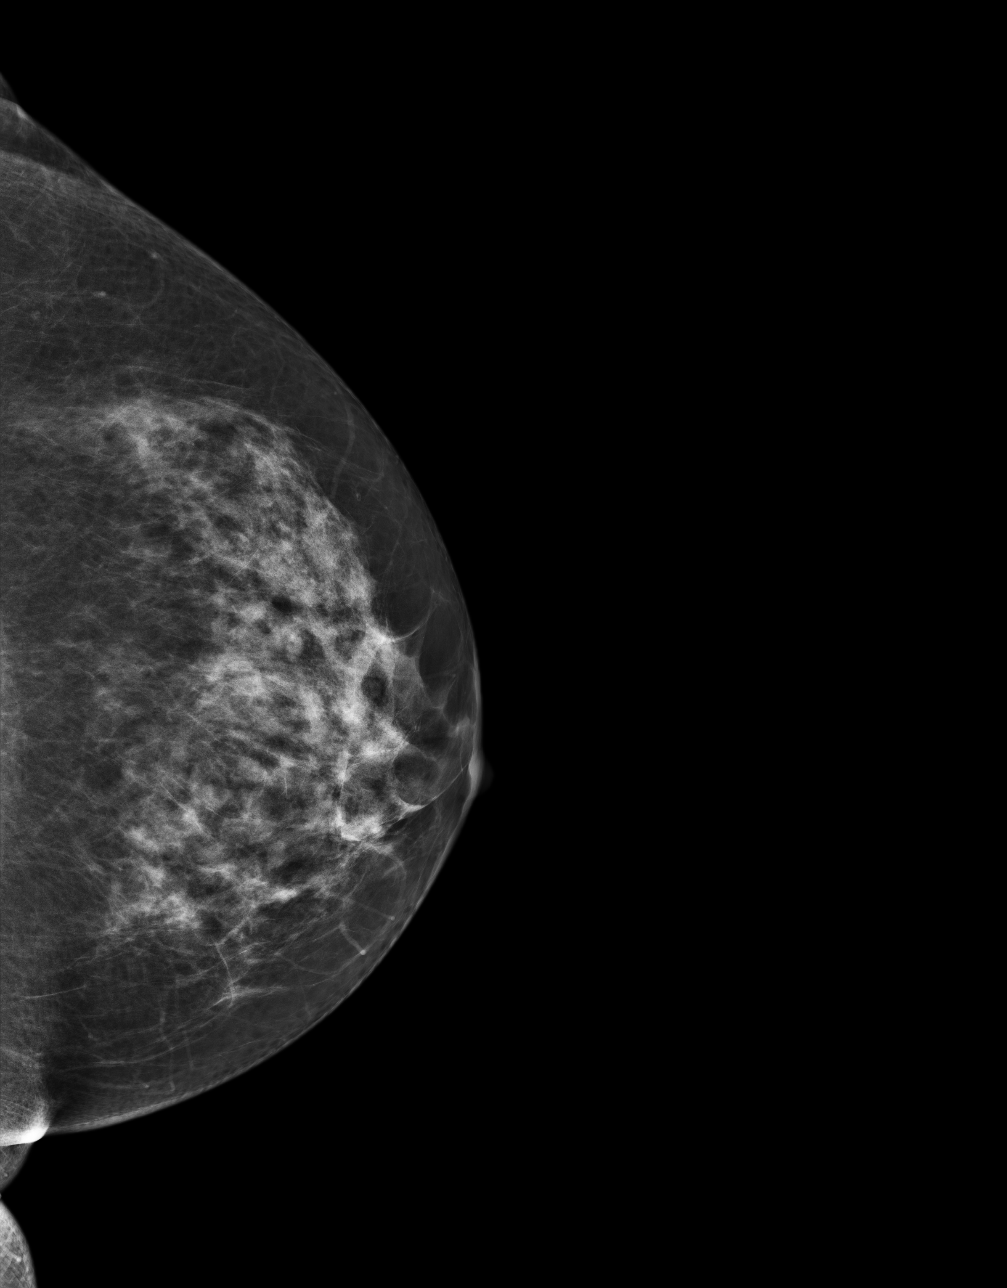
[im 3/4]
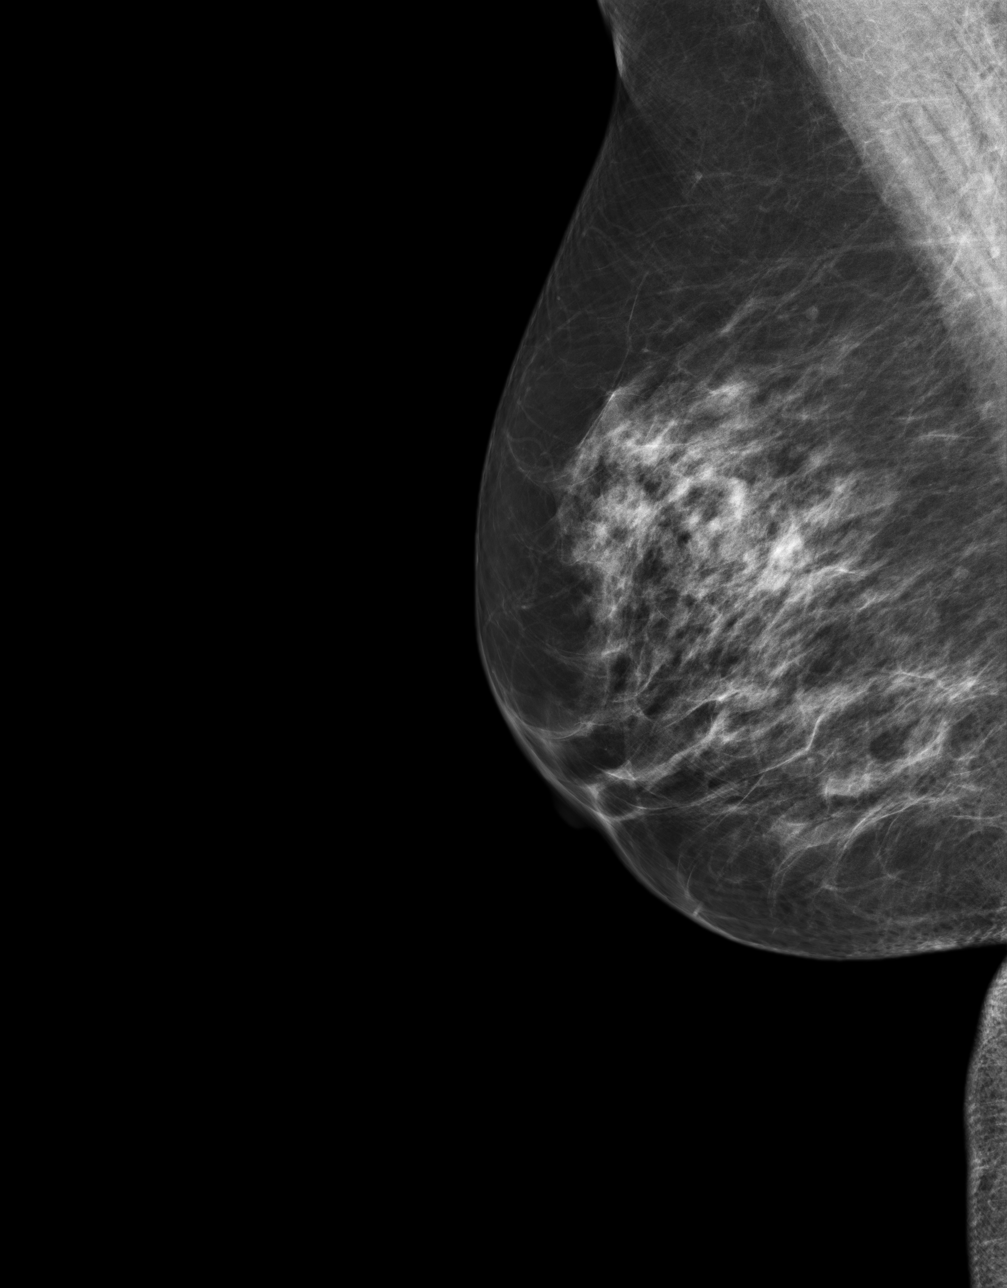
[im 4/4]
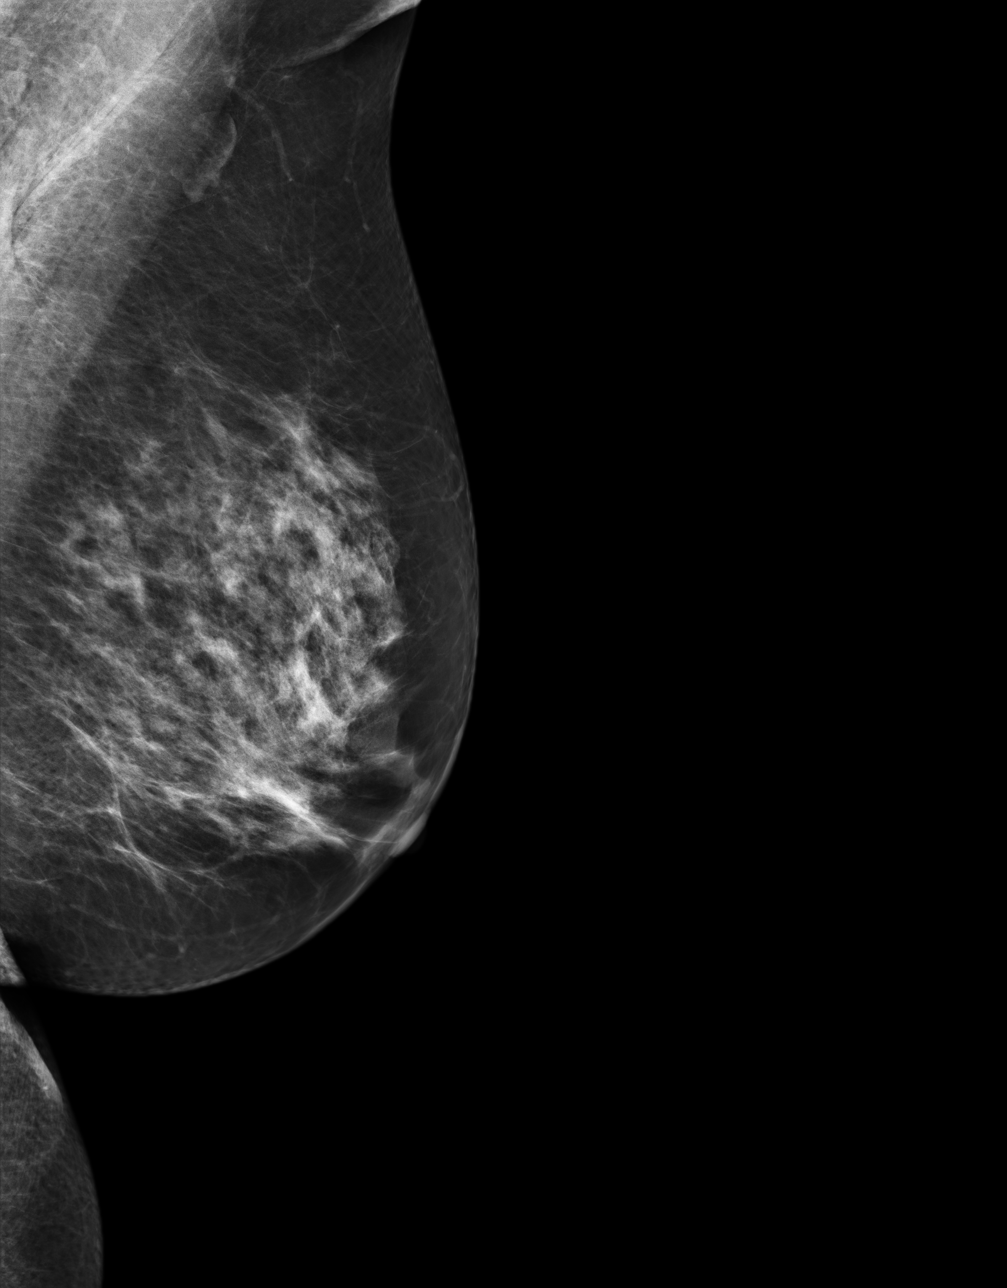

[4 of 4 positions shown; findings below may reference images not displayed]

ACR Breast Density Category c: The breast tissue is heterogeneously
dense, which may obscure small masses.
FINDINGS: In the right breast, a possible asymmetry warrants further
evaluation with spot compression views and possibly ultrasound. In
the left breast, no findings suspicious for malignancy. Images were
processed with CAD.
IMPRESSION: Further evaluation is suggested for possible asymmetry in the right
breast.

RECOMMENDATION:
Diagnostic mammogram and possibly ultrasound of the right breast.
(Code:[7G])

The patient will be contacted regarding the findings, and additional
imaging will be scheduled.

BI-RADS CATEGORY  0: Incomplete. Need additional imaging evaluation
and/or prior mammograms for comparison.

## 2014-05-16 ENCOUNTER — Ambulatory Visit: Payer: Self-pay | Admitting: Family Medicine

## 2014-05-16 IMAGING — MG MM ADDITIONAL VIEWS AT NO CHARGE
4 series · 4 of 4 positions shown · non-contrast
Comparison: [DATE] and prior mammograms dating back to
[DATE]

CLINICAL DATA: 68-year-old female with possible inner right breast
mass on screening mammograms.

EXAM:
DIGITAL DIAGNOSTIC  RIGHT MAMMOGRAM WITH CAD

[R CC (1 of 2)]
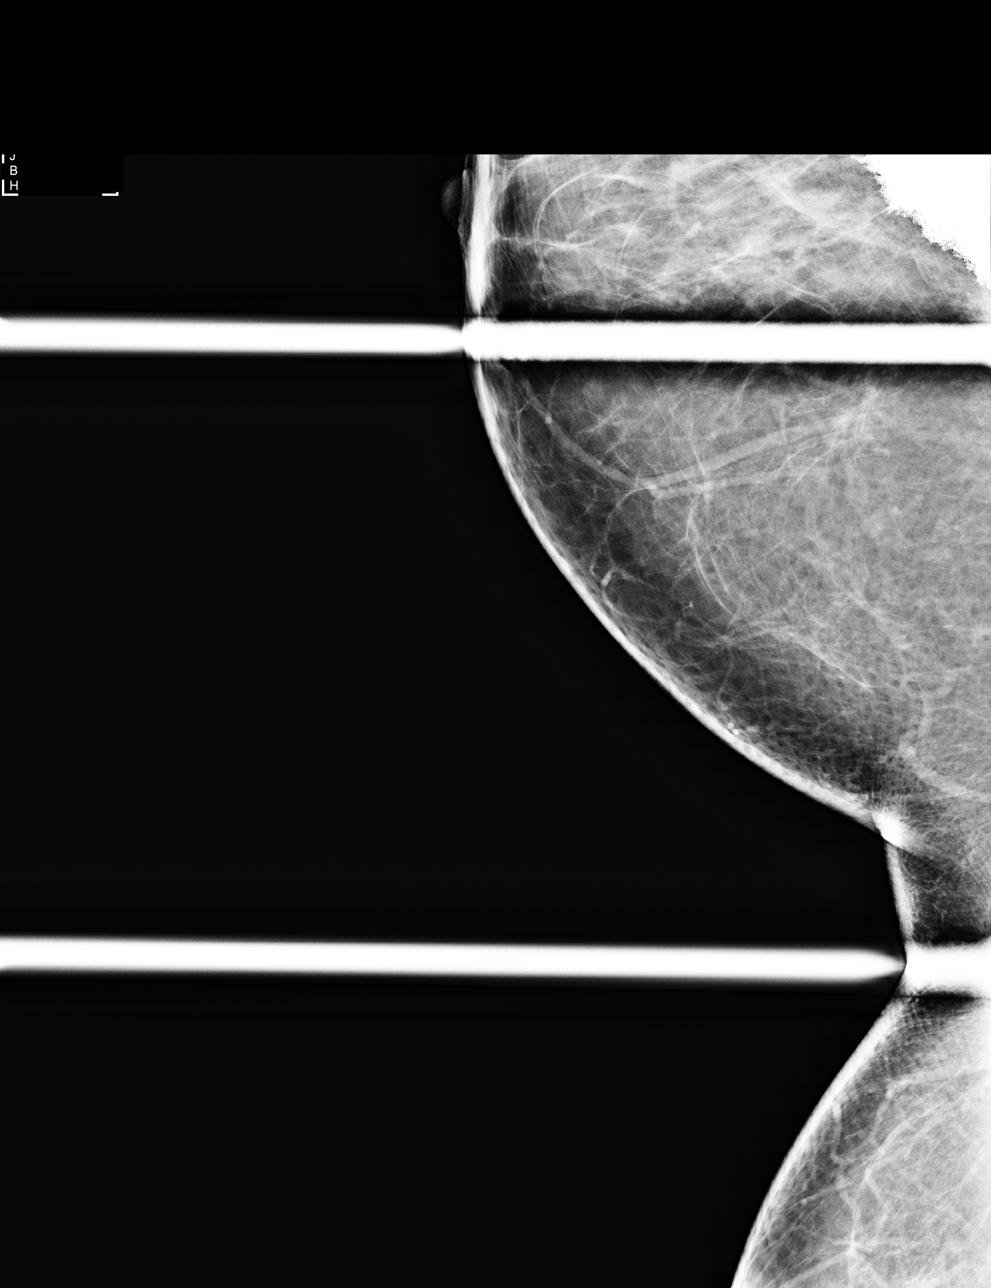

[R ML]
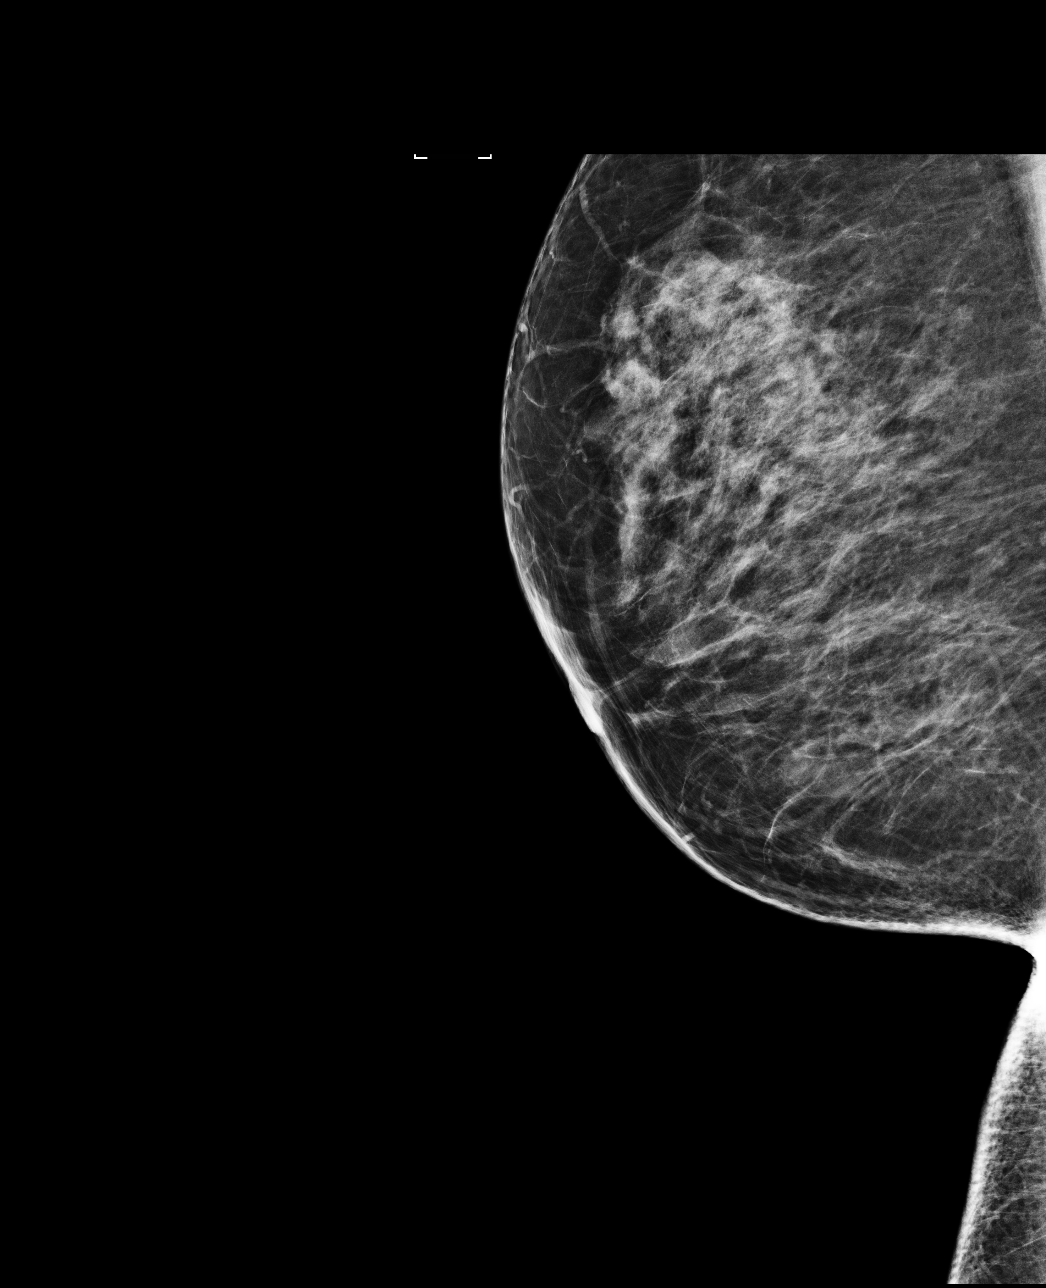

[R CV]
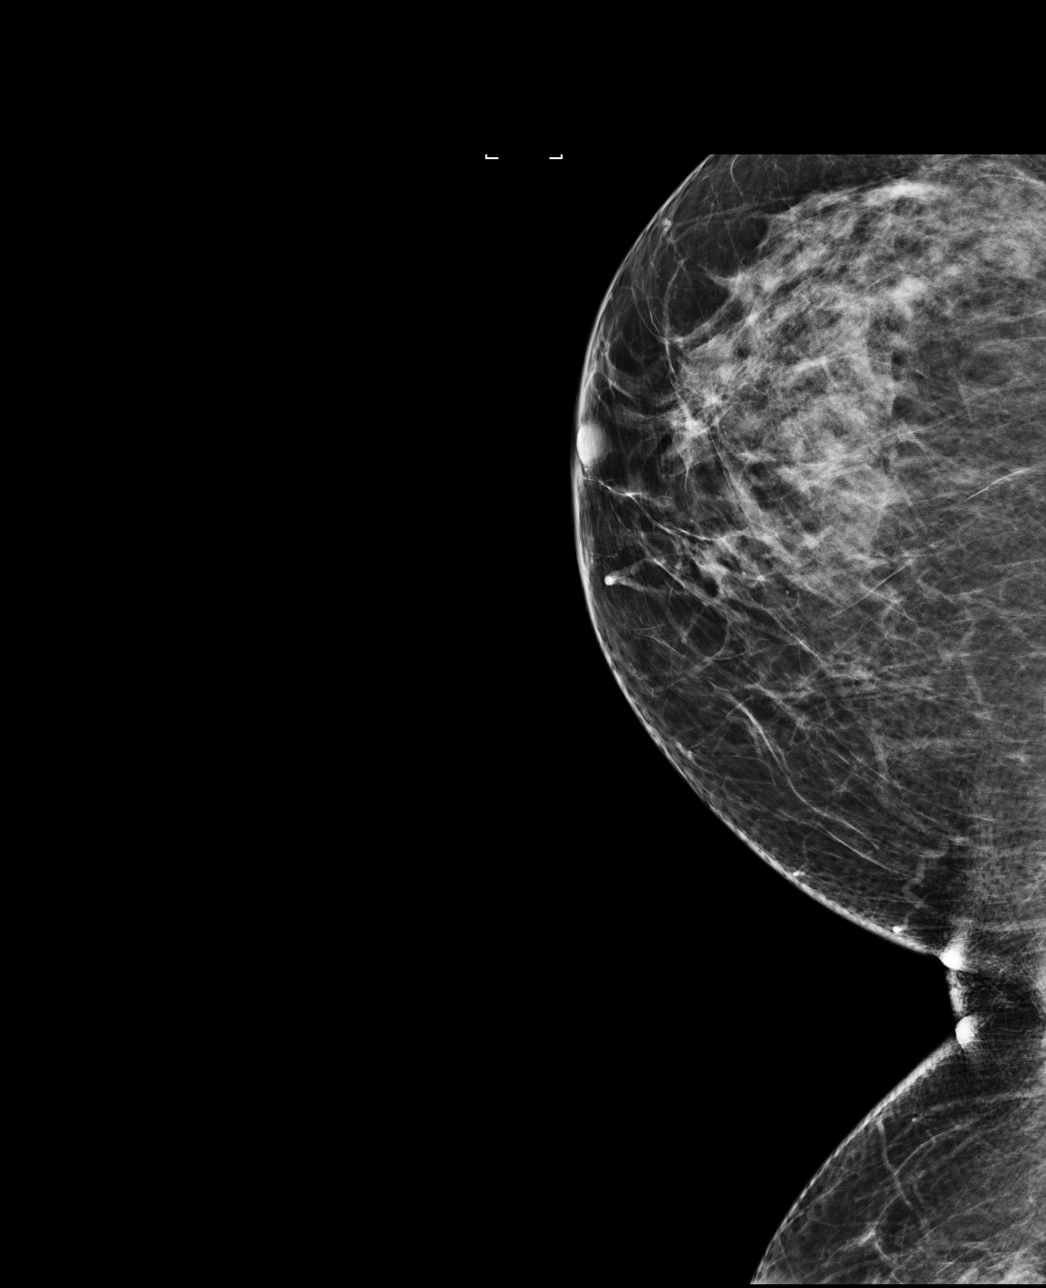

[R CC (2 of 2)]
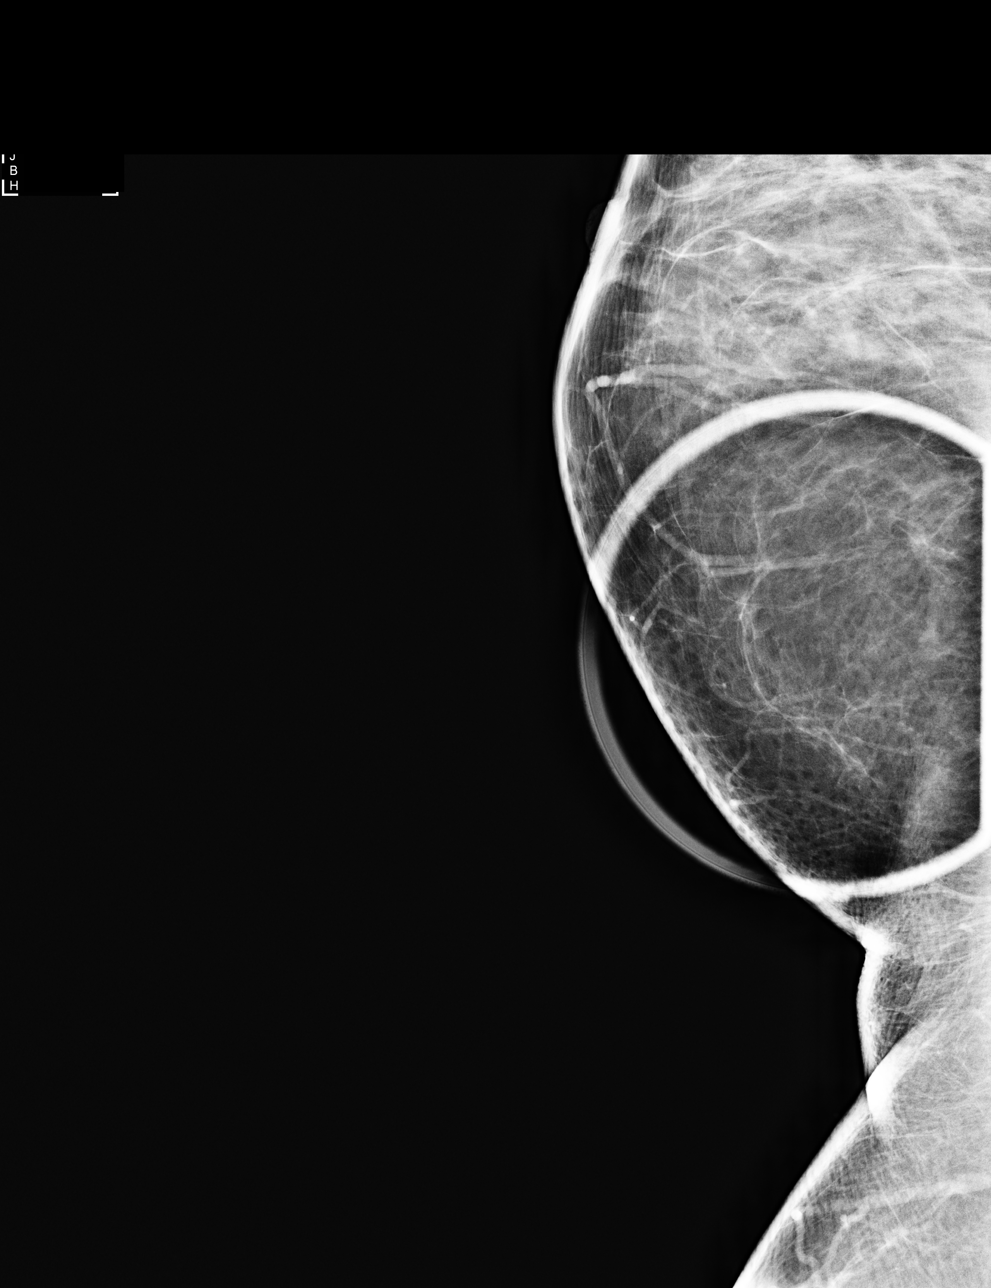

[4 of 4 positions shown; findings below may reference images not displayed]

ACR Breast Density Category b: There are scattered areas of
fibroglandular density.
FINDINGS: Spot compression and full field views of the right breast
demonstrate no persistent mass or density in the area of the
screening study finding. No distortion or worrisome calcifications
are noted within the right breast

Mammographic images were processed with CAD.
IMPRESSION: No persistent abnormality in the area of the screening study
finding.

RECOMMENDATION:
Bilateral screening mammograms in 1 year.

I have discussed the findings and recommendations with the patient.
Results were also provided in writing at the conclusion of the
visit. If applicable, a reminder letter will be sent to the patient
regarding the next appointment.

BI-RADS CATEGORY  1: Negative

## 2014-08-01 DIAGNOSIS — M8580 Other specified disorders of bone density and structure, unspecified site: Secondary | ICD-10-CM | POA: Diagnosis not present

## 2014-08-01 DIAGNOSIS — I10 Essential (primary) hypertension: Secondary | ICD-10-CM | POA: Diagnosis not present

## 2014-08-01 DIAGNOSIS — Z9181 History of falling: Secondary | ICD-10-CM | POA: Diagnosis not present

## 2014-08-01 DIAGNOSIS — E785 Hyperlipidemia, unspecified: Secondary | ICD-10-CM | POA: Diagnosis not present

## 2014-08-01 DIAGNOSIS — Z1389 Encounter for screening for other disorder: Secondary | ICD-10-CM | POA: Diagnosis not present

## 2014-08-08 DIAGNOSIS — I10 Essential (primary) hypertension: Secondary | ICD-10-CM | POA: Diagnosis not present

## 2014-08-08 DIAGNOSIS — M8580 Other specified disorders of bone density and structure, unspecified site: Secondary | ICD-10-CM | POA: Diagnosis not present

## 2014-08-08 DIAGNOSIS — E785 Hyperlipidemia, unspecified: Secondary | ICD-10-CM | POA: Diagnosis not present

## 2014-09-05 DIAGNOSIS — Z008 Encounter for other general examination: Secondary | ICD-10-CM | POA: Diagnosis not present

## 2014-09-10 NOTE — Op Note (Signed)
PATIENT NAME:  Vanessa Reynolds, Vanessa Reynolds MR#:  [id number] DATE OF BIRTH:  24-Mar-1946  DATE OF PROCEDURE:  04/12/2014  PREOPERATIVE DIAGNOSIS:  Nuclear sclerotic cataract of the right eye.   POSTOPERATIVE DIAGNOSIS:  Nuclear sclerotic cataract of the right eye.   OPERATIVE PROCEDURE:  Cataract extraction by phacoemulsification with implant of intraocular lens to right eye.   SURGEON:  Birder Robson, MD.   ANESTHESIA:  1. Managed anesthesia care.  2. Topical tetracaine drops followed by 2% Xylocaine jelly applied in the preoperative holding area.   COMPLICATIONS:  None.   TECHNIQUE:   Stop and chop.  DESCRIPTION OF PROCEDURE:  The patient was examined and consented in the preoperative holding area where the aforementioned topical anesthesia was applied to the right eye and then brought back to the operating room where the right eye was prepped and draped in the usual sterile ophthalmic fashion and a lid speculum was placed. A paracentesis was created with the side port blade and the anterior chamber was filled with viscoelastic. A near clear corneal incision was performed with the steel keratome. A continuous curvilinear capsulorrhexis was performed with a cystotome followed by the capsulorrhexis forceps. Hydrodissection and hydrodelineation were carried out with BSS on a blunt cannula. The lens was removed in a stop and chop technique and the remaining cortical material was removed with the irrigation-aspiration handpiece. The capsular bag was inflated with viscoelastic and the Tecnis ZCB00 17.0 diopter lens, serial number [id number] was placed in the capsular bag without complication. The remaining viscoelastic was removed from the eye with the irrigation-aspiration handpiece. The wounds were hydrated. The anterior chamber was flushed with Miostat and the eye was inflated to physiologic pressure. 0.1 mL of cefuroxime concentration 10 mg/mL was placed in the anterior chamber. The wounds were found to be  water tight. The eye was dressed with Vigamox. The patient was given protective glasses to wear throughout the day and a shield with which to sleep tonight. The patient was also given drops with which to begin a drop regimen today and will follow-up with me in one day.    ____________________________ Livingston Diones. Alicianna Litchford, MD wlp:bm D: 04/12/2014 17:29:20 ET T: 04/12/2014 22:53:38 ET JOB#: [id number]  cc: Kisean Rollo L. Eular Panek, MD, <Dictator> Livingston Diones Makira Holleman MD ELECTRONICALLY SIGNED 04/13/2014 15:25

## 2014-09-10 NOTE — Op Note (Signed)
PATIENT NAME:  Vanessa Reynolds, Vanessa Reynolds MR#:  [id number] DATE OF BIRTH:  1945-09-08  DATE OF PROCEDURE:  03/22/2014   PREOPERATIVE DIAGNOSIS:  Nuclear sclerotic cataract of the left eye.   POSTOPERATIVE DIAGNOSIS:  Nuclear sclerotic cataract of the left eye.   OPERATIVE PROCEDURE:  Cataract extraction by phacoemulsification with implant of intraocular lens to left eye.   SURGEON:  Birder Robson, MD.   ANESTHESIA:  1. Managed anesthesia care.  2. Topical tetracaine drops followed by 2% Xylocaine jelly applied in the preoperative holding area.   COMPLICATIONS:  None.   TECHNIQUE:   Stop and chop   DESCRIPTION OF PROCEDURE:  The patient was examined and consented in the preoperative holding area where the aforementioned topical anesthesia was applied to the left eye and then brought back to the Operating Room where the left eye was prepped and draped in the usual sterile ophthalmic fashion and a lid speculum was placed. A paracentesis was created with the side port blade and the anterior chamber was filled with viscoelastic. A near clear corneal incision was performed with the steel keratome. A continuous curvilinear capsulorrhexis was performed with a cystotome followed by the capsulorrhexis forceps. Hydrodissection and hydrodelineation were carried out with BSS on a blunt cannula. The lens was removed in a stop and chop technique and the remaining cortical material was removed with the irrigation-aspiration handpiece. The capsular bag was inflated with viscoelastic and the Tecnis ZCB00 16.5-diopter lens, serial number [id number] was placed in the capsular bag without complication. The remaining viscoelastic was removed from the eye with the irrigation-aspiration handpiece. The wounds were hydrated. The anterior chamber was flushed with Miostat and the eye was inflated to physiologic pressure. 0.1 mL of cefuroxime concentration 10 mg/mL was placed in the anterior chamber. The wounds were found to be water  tight. The eye was dressed with Vigamox. The patient was given protective glasses to wear throughout the day and a shield with which to sleep tonight. The patient was also given drops with which to begin a drop regimen today and will follow-up with me in one day.     ____________________________ Livingston Diones. Siara Gorder, MD wlp:jp D: 03/22/2014 21:14:55 ET T: 03/23/2014 09:04:34 ET JOB#: [id number]  cc: Jayvan Mcshan L. Aadvik Roker, MD, <Dictator> Livingston Diones Lysander Calixte MD ELECTRONICALLY SIGNED 03/24/2014 16:57

## 2015-01-12 ENCOUNTER — Other Ambulatory Visit: Payer: Self-pay

## 2015-01-12 DIAGNOSIS — C189 Malignant neoplasm of colon, unspecified: Secondary | ICD-10-CM

## 2015-02-20 ENCOUNTER — Ambulatory Visit (INDEPENDENT_AMBULATORY_CARE_PROVIDER_SITE_OTHER): Payer: Medicare Other | Admitting: Family Medicine

## 2015-02-20 ENCOUNTER — Encounter: Payer: Self-pay | Admitting: Family Medicine

## 2015-02-20 VITALS — BP 160/80 | HR 63 | Temp 98.1°F | Resp 16 | Ht 64.5 in | Wt 181.0 lb

## 2015-02-20 DIAGNOSIS — C189 Malignant neoplasm of colon, unspecified: Secondary | ICD-10-CM | POA: Diagnosis not present

## 2015-02-20 DIAGNOSIS — C187 Malignant neoplasm of sigmoid colon: Secondary | ICD-10-CM

## 2015-02-20 DIAGNOSIS — I10 Essential (primary) hypertension: Secondary | ICD-10-CM

## 2015-02-20 DIAGNOSIS — E785 Hyperlipidemia, unspecified: Secondary | ICD-10-CM | POA: Diagnosis not present

## 2015-02-20 DIAGNOSIS — Z23 Encounter for immunization: Secondary | ICD-10-CM | POA: Diagnosis not present

## 2015-02-20 MED ORDER — LISINOPRIL 30 MG PO TABS: 30.0000 mg | ORAL_TABLET | Freq: Every day | ORAL | Status: DC

## 2015-02-20 NOTE — Progress Notes (Signed)
Name: Vanessa Reynolds   MRN: [id number]    DOB: February 17, 1946   Date:02/20/2015       Progress Note  Subjective  Chief Complaint  Chief Complaint  Patient presents with  . Hypertension  . Hyperlipidemia    HPI Here for f/u of HBP and elevated lipids.  She is 4 yrs post colon/rectal cancer and is doing well.  Sees Dr. Marlyn Corporal.  No problem-specific assessment & plan notes found for this encounter.   Past Medical History  Diagnosis Date  . Personal history of tobacco use, presenting hazards to health   . H/O cystitis 2011  . Nausea with vomiting   . Acute parametritis and pelvic cellulitis   . Breast screening, unspecified   . Abdominal pain, left lower quadrant 2012  . Obesity, unspecified   . Personal history of malignant neoplasm of large intestine 2013  . 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    Social History  Substance Use Topics  . Smoking status: Former Smoker -- 1.00 packs/day for 30 years    Types: Cigarettes  . Smokeless tobacco: Never Used     Comment: quit in 1999  . Alcohol Use: No     Current outpatient prescriptions:  .  cholecalciferol (VITAMIN D) 1000 UNITS tablet, Take 1,000 Units by mouth daily., Disp: , Rfl:  .  lisinopril (PRINIVIL,ZESTRIL) 20 MG tablet, , Disp: , Rfl:  .  lovastatin (MEVACOR) 10 MG tablet, , Disp: , Rfl:  .  Probiotic Product (PROBIOTIC DAILY PO), Take 1 tablet by mouth daily., Disp: , Rfl:   Allergies  Allergen Reactions  . Fish Oil Other (See Comments)    Diarrhea     Review of Systems  Constitutional: Negative for fever, chills, weight loss and malaise/fatigue.  HENT: Negative for hearing loss.   Eyes: Negative for blurred vision and double vision.  Respiratory: Negative for cough, shortness of breath and wheezing.   Cardiovascular: Negative for chest pain, palpitations and leg swelling.  Gastrointestinal: Negative for heartburn, abdominal pain, constipation and blood  in stool.  Genitourinary: Negative for dysuria, urgency and frequency.  Musculoskeletal: Negative for myalgias and joint pain.  Skin: Negative for rash.  Neurological: Negative for dizziness, weakness and headaches.      Objective  Filed Vitals:   02/20/15 1011  BP: 147/77  Pulse: 63  Temp: 98.1 F (36.7 C)  TempSrc: Oral  Resp: 16  Height: 5' 4.5" (1.638 m)  Weight: 181 lb (82.101 kg)     Physical Exam  Constitutional: She is oriented to person, place, and time and well-developed, well-nourished, and in no distress. No distress.  HENT:  Head: Normocephalic and atraumatic.  Eyes: Conjunctivae and EOM are normal. Pupils are equal, round, and reactive to light. No scleral icterus.  Neck: Normal range of motion. Neck supple. No thyromegaly present.  Cardiovascular: Normal rate, regular rhythm, normal heart sounds and intact distal pulses.  Exam reveals no gallop and no friction rub.   No murmur heard. Pulmonary/Chest: Effort normal and breath sounds normal. No respiratory distress. She has no wheezes. She has no rales.  Abdominal: Soft. Bowel sounds are normal. She exhibits no distension. There is no tenderness. There is no rebound.  Musculoskeletal: She exhibits no edema.  Lymphadenopathy:    She has no cervical adenopathy.  Neurological: She is alert and oriented to person, place, and time.  Vitals reviewed.     No results found for  this or any previous visit (from the past 2160 hour(s)).   Assessment & Plan  1. Essential hypertension  - lisinopril (PRINIVIL,ZESTRIL) 30 MG tablet; Take 1 tablet (30 mg total) by mouth daily.  Dispense: 30 tablet; Refill: 6 - Comprehensive Metabolic Panel (CMET)  2. Need for influenza vaccination  - Flu vaccine HIGH DOSE PF (Fluzone High dose)  3. Hyperlipidemia  - Lipid Profile  4. Malignant neoplasm of sigmoid colon (HCC)  - CBC with Differential

## 2015-02-20 NOTE — Patient Instructions (Signed)
Cont. Chol med at current dose.  Discussed weight loss.

## 2015-02-21 LAB — COMPREHENSIVE METABOLIC PANEL
ALT: 22 IU/L (ref 0–32)
AST: 25 IU/L (ref 0–40)
Albumin/Globulin Ratio: 1.1 (ref 1.1–2.5)
Albumin: 3.8 g/dL (ref 3.5–4.8)
Alkaline Phosphatase: 149 IU/L — ABNORMAL HIGH (ref 39–117)
BUN/Creatinine Ratio: 22 (ref 11–26)
BUN: 16 mg/dL (ref 8–27)
Bilirubin Total: 0.4 mg/dL (ref 0.0–1.2)
CO2: 24 mmol/L (ref 18–29)
Calcium: 9.5 mg/dL (ref 8.7–10.3)
Chloride: 102 mmol/L (ref 97–108)
Creatinine, Ser: 0.72 mg/dL (ref 0.57–1.00)
GFR calc Af Amer: 98 mL/min/{1.73_m2} (ref >59)
GFR calc non Af Amer: 85 mL/min/{1.73_m2} (ref >59)
Globulin, Total: 3.5 g/dL (ref 1.5–4.5)
Glucose: 80 mg/dL (ref 65–99)
Potassium: 4.4 mmol/L (ref 3.5–5.2)
Sodium: 140 mmol/L (ref 134–144)
Total Protein: 7.3 g/dL (ref 6.0–8.5)

## 2015-02-21 LAB — CBC WITH DIFFERENTIAL/PLATELET
Basophils Absolute: 0.1 10*3/uL (ref 0.0–0.2)
Basos: 1 %
EOS (ABSOLUTE): 0.5 10*3/uL — ABNORMAL HIGH (ref 0.0–0.4)
Eos: 8 %
Hematocrit: 39 % (ref 34.0–46.6)
Hemoglobin: 13.1 g/dL (ref 11.1–15.9)
Immature Grans (Abs): 0 10*3/uL (ref 0.0–0.1)
Immature Granulocytes: 0 %
Lymphocytes Absolute: 1.9 10*3/uL (ref 0.7–3.1)
Lymphs: 32 %
MCH: 30.3 pg (ref 26.6–33.0)
MCHC: 33.6 g/dL (ref 31.5–35.7)
MCV: 90 fL (ref 79–97)
Monocytes Absolute: 0.3 10*3/uL (ref 0.1–0.9)
Monocytes: 6 %
Neutrophils Absolute: 3.1 10*3/uL (ref 1.4–7.0)
Neutrophils: 53 %
Platelets: 207 10*3/uL (ref 150–379)
RBC: 4.33 x10E6/uL (ref 3.77–5.28)
RDW: 13.4 % (ref 12.3–15.4)
WBC: 5.9 10*3/uL (ref 3.4–10.8)

## 2015-02-21 LAB — LIPID PANEL
Chol/HDL Ratio: 3.2 ratio units (ref 0.0–4.4)
Cholesterol, Total: 198 mg/dL (ref 100–199)
HDL: 62 mg/dL (ref >39)
LDL Calculated: 122 mg/dL — ABNORMAL HIGH (ref 0–99)
Triglycerides: 72 mg/dL (ref 0–149)
VLDL Cholesterol Cal: 14 mg/dL (ref 5–40)

## 2015-02-21 LAB — CEA: CEA: 1.3 ng/mL (ref 0.0–4.7)

## 2015-02-27 ENCOUNTER — Ambulatory Visit (INDEPENDENT_AMBULATORY_CARE_PROVIDER_SITE_OTHER): Payer: Medicare Other | Admitting: General Surgery

## 2015-02-27 ENCOUNTER — Encounter: Payer: Self-pay | Admitting: General Surgery

## 2015-02-27 VITALS — BP 140/76 | HR 76 | Resp 12 | Ht 64.0 in | Wt 182.0 lb

## 2015-02-27 DIAGNOSIS — C189 Malignant neoplasm of colon, unspecified: Secondary | ICD-10-CM

## 2015-02-27 DIAGNOSIS — C20 Malignant neoplasm of rectum: Secondary | ICD-10-CM

## 2015-02-27 NOTE — Patient Instructions (Signed)
Patient to return in one year colon cancer follow up with [facility]

## 2015-02-27 NOTE — Progress Notes (Signed)
Patient ID: Vanessa Reynolds, female   DOB: 05/27/1945, 69 y.o.   MRN: [id number]  Chief Complaint  Patient presents with  . Follow-up    Colon Cancer    HPI Vanessa Reynolds is a 69 y.o. female  Here for a follow up for colon cancer. Her last colonoscopy was done on 04/20/14. Patient had her [facility] done 02/20/15.  HPI  Past Medical History  Diagnosis Date  . Personal history of tobacco use, presenting hazards to health   . H/O cystitis 2011  . Nausea with vomiting   . Acute parametritis and pelvic cellulitis   . Breast screening, unspecified   . Abdominal pain, left lower quadrant 2012  . Obesity, unspecified   . Personal history of malignant neoplasm of large intestine 2013  . 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    Past Surgical History  Procedure Laterality Date  . 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 Bilateral oophorectomy  December 2011    Completed at time of low anterior resection.  . Colonoscopy  [id number]    Dr. Vira Agar, Dr. Addison Lank)  . Appendectomy  December 2011    Family History  Problem Relation Age of Onset  . Hypertension Mother     deceased, age 67  . Cancer Father     gastric cancer, deceased, mid 15's    Social History Social History  Substance Use Topics  . Smoking status: Former Smoker -- 1.00 packs/day for 30 years    Types: Cigarettes  . Smokeless tobacco: Never Used     Comment: quit in 1999  . Alcohol Use: No    Allergies  Allergen Reactions  . Fish Oil Other (See Comments)    Diarrhea     Current Outpatient Prescriptions  Medication Sig Dispense Refill  . cholecalciferol (VITAMIN D) 1000 UNITS tablet Take 1,000 Units by mouth daily.    Marland Kitchen lisinopril (PRINIVIL,ZESTRIL) 30 MG tablet Take 1 tablet (30 mg total) by mouth daily. 30 tablet 6  . lovastatin (MEVACOR) 10 MG tablet     . Probiotic Product  (PROBIOTIC DAILY PO) Take 1 tablet by mouth daily.     No current facility-administered medications for this visit.    Review of Systems Review of Systems  Constitutional: Negative.   Respiratory: Negative.   Cardiovascular: Negative.     Blood pressure 140/76, pulse 76, resp. rate 12, height 5\' 4"  (1.626 m), weight 182 lb (82.555 kg).  Physical Exam Physical Exam  Constitutional: She is oriented to person, place, and time. She appears well-developed and well-nourished.  Eyes: Conjunctivae are normal. No scleral icterus.  Neck: Neck supple.  Cardiovascular: Normal rate, regular rhythm and normal heart sounds.   Pulmonary/Chest: Effort normal and breath sounds normal.  Abdominal: Soft. Normal appearance and bowel sounds are normal. There is no hepatomegaly. There is no tenderness. No hernia.  Lymphadenopathy:    She has no cervical adenopathy.       Right: No supraclavicular adenopathy present.       Left: No supraclavicular adenopathy present.  Neurological: She is alert and oriented to person, place, and time.  Skin: Skin is warm and dry.    Data Reviewed [facility] dated 02/20/2015 was 1.3. Previous values: 1.5.  Colonoscopy completed in 2015 was normal except for sigmoid diverticulosis. Five-year follow-up is planned.  Assessment    Doing  well now 5 years status post low anterior resection for rectal cancer.    Plan        Patient to return in one year colon cancer follow up with [facility] . PCP:  Philbert Riser, Dolores Lory 02/27/2015, 8:09 PM

## 2015-03-27 ENCOUNTER — Ambulatory Visit (INDEPENDENT_AMBULATORY_CARE_PROVIDER_SITE_OTHER): Payer: Medicare Other | Admitting: Family Medicine

## 2015-03-27 ENCOUNTER — Ambulatory Visit: Payer: Medicare Other | Admitting: Family Medicine

## 2015-03-27 ENCOUNTER — Encounter: Payer: Self-pay | Admitting: Family Medicine

## 2015-03-27 VITALS — BP 118/71 | HR 71 | Resp 16 | Ht 64.0 in | Wt 179.8 lb

## 2015-03-27 DIAGNOSIS — I10 Essential (primary) hypertension: Secondary | ICD-10-CM

## 2015-03-27 DIAGNOSIS — C187 Malignant neoplasm of sigmoid colon: Secondary | ICD-10-CM | POA: Diagnosis not present

## 2015-03-27 DIAGNOSIS — E785 Hyperlipidemia, unspecified: Secondary | ICD-10-CM

## 2015-03-27 NOTE — Patient Instructions (Signed)
Plan repeat CMP on return

## 2015-03-27 NOTE — Progress Notes (Signed)
Name: Vanessa Reynolds   MRN: [id number]    DOB: 08-23-1945   Date:03/27/2015       Progress Note  Subjective  Chief Complaint  Chief Complaint  Patient presents with  . Hypertension    Hypertension Pertinent negatives include no blurred vision, chest pain, headaches, malaise/fatigue, palpitations or shortness of breath.  Here for f/u of HBP.  BPs at home in 120/80 range.  Feels well and takes meds.  Also with hx. Of sigmoid colon cancer with resection.  Doing well with this.  Sees Dr. Marlyn Corporal for this.  No problem-specific assessment & plan notes found for this encounter.   Past Medical History  Diagnosis Date  . Personal history of tobacco use, presenting hazards to health   . H/O cystitis 2011  . Nausea with vomiting   . Acute parametritis and pelvic cellulitis   . Breast screening, unspecified   . Abdominal pain, left lower quadrant 2012  . Obesity, unspecified   . Personal history of malignant neoplasm of large intestine 2013  . 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    Social History  Substance Use Topics  . Smoking status: Former Smoker -- 1.00 packs/day for 30 years    Types: Cigarettes  . Smokeless tobacco: Never Used     Comment: quit in 1999  . Alcohol Use: No     Current outpatient prescriptions:  .  cholecalciferol (VITAMIN D) 1000 UNITS tablet, Take 1,000 Units by mouth daily., Disp: , Rfl:  .  lisinopril (PRINIVIL,ZESTRIL) 30 MG tablet, Take 1 tablet (30 mg total) by mouth daily., Disp: 30 tablet, Rfl: 6 .  lovastatin (MEVACOR) 10 MG tablet, , Disp: , Rfl:  .  Probiotic Product (PROBIOTIC DAILY PO), Take 1 tablet by mouth daily., Disp: , Rfl:   Allergies  Allergen Reactions  . Fish Oil Other (See Comments)    Diarrhea     Review of Systems  Constitutional: Negative for fever, chills, weight loss and malaise/fatigue.  HENT: Negative for hearing loss.   Eyes: Negative for blurred vision and  double vision.  Respiratory: Negative for cough, sputum production, shortness of breath and wheezing.   Cardiovascular: Negative for chest pain, palpitations and leg swelling.  Gastrointestinal: Negative for heartburn, abdominal pain, diarrhea, constipation, blood in stool and melena.  Genitourinary: Negative for dysuria, urgency and frequency.  Musculoskeletal: Negative for myalgias and joint pain.  Skin: Negative for rash.  Neurological: Negative for dizziness, tremors, weakness and headaches.  Psychiatric/Behavioral: Negative for depression. The patient is not nervous/anxious and does not have insomnia.       Objective  Filed Vitals:   03/27/15 1052  BP: 118/71  Pulse: 71  Resp: 16  Height: _0  (1.626 m)  Weight: 179 lb 12.8 oz (81.557 kg)     Physical Exam  Constitutional: She is oriented to person, place, and time and well-developed, well-nourished, and in no distress. No distress.  HENT:  Head: Normocephalic and atraumatic.  Eyes: Conjunctivae and EOM are normal. Pupils are equal, round, and reactive to light. No scleral icterus.  Neck: Normal range of motion. Neck supple. Carotid bruit is not present. No thyromegaly present.  Cardiovascular: Normal rate, regular rhythm, normal heart sounds and intact distal pulses.  Exam reveals no gallop and no friction rub.   No murmur heard. Pulmonary/Chest: Effort normal and breath sounds normal. No respiratory distress. She has no wheezes. She has no rales.  Abdominal:  Soft. Bowel sounds are normal. She exhibits no distension, no abdominal bruit and no mass. There is no tenderness.  Musculoskeletal: She exhibits no edema.  Lymphadenopathy:    She has no cervical adenopathy.  Neurological: She is alert and oriented to person, place, and time.  Vitals reviewed.     Recent Results (from the past 2160 hour(s))  CEA     Status: None   Collection Time: 02/20/15  9:10 AM  Result Value Ref Range   CEA 1.3 0.0 - 4.7 ng/mL     Comment:        Roche ECLIA methodology       Nonsmokers  <3.9                                      Smokers     <5.6   Comprehensive Metabolic Panel (CMET)     Status: Abnormal   Collection Time: 02/20/15 11:38 AM  Result Value Ref Range   Glucose 80 65 - 99 mg/dL   BUN 16 8 - 27 mg/dL   Creatinine, Ser 0.72 0.57 - 1.00 mg/dL   GFR calc non Af Amer 85 >59 mL/min/1.73   GFR calc Af Amer 98 >59 mL/min/1.73   BUN/Creatinine Ratio 22 11 - 26   Sodium 140 134 - 144 mmol/L    Comment: **Effective March 06, 2015 the reference interval**   for Sodium, Serum will be changing to:                                             136 - 144    Potassium 4.4 3.5 - 5.2 mmol/L    Comment: **Effective March 06, 2015 the reference interval**   for Potassium, Serum will be changing to:                          0 -  7 days        3.7 - 5.2                          8 - 30 days        3.7 - 6.4                          1 -  6 months      3.8 - 6.0                   7 months -  1 year        3.8 - 5.3                              >1 year        3.5 - 5.2    Chloride 102 97 - 108 mmol/L    Comment: **Effective March 06, 2015 the reference interval**   for Chloride, Serum will be changing to:  97 - 106    CO2 24 18 - 29 mmol/L   Calcium 9.5 8.7 - 10.3 mg/dL   Total Protein 7.3 6.0 - 8.5 g/dL   Albumin 3.8 3.5 - 4.8 g/dL   Globulin, Total 3.5 1.5 - 4.5 g/dL   Albumin/Globulin Ratio 1.1 1.1 - 2.5   Bilirubin Total 0.4 0.0 - 1.2 mg/dL   Alkaline Phosphatase 149 (H) 39 - 117 IU/L   AST 25 0 - 40 IU/L   ALT 22 0 - 32 IU/L  CBC with Differential     Status: Abnormal   Collection Time: 02/20/15 11:38 AM  Result Value Ref Range   WBC 5.9 3.4 - 10.8 x10E3/uL   RBC 4.33 3.77 - 5.28 x10E6/uL   Hemoglobin 13.1 11.1 - 15.9 g/dL   Hematocrit 39.0 34.0 - 46.6 %   MCV 90 79 - 97 fL   MCH 30.3 26.6 - 33.0 pg   MCHC 33.6 31.5 - 35.7 g/dL   RDW 13.4 12.3 - 15.4 %    Platelets 207 150 - 379 x10E3/uL   Neutrophils 53 %   Lymphs 32 %   Monocytes 6 %   Eos 8 %   Basos 1 %   Neutrophils Absolute 3.1 1.4 - 7.0 x10E3/uL   Lymphocytes Absolute 1.9 0.7 - 3.1 x10E3/uL   Monocytes Absolute 0.3 0.1 - 0.9 x10E3/uL   EOS (ABSOLUTE) 0.5 (H) 0.0 - 0.4 x10E3/uL   Basophils Absolute 0.1 0.0 - 0.2 x10E3/uL   Immature Granulocytes 0 %   Immature Grans (Abs) 0.0 0.0 - 0.1 x10E3/uL  Lipid Profile     Status: Abnormal   Collection Time: 02/20/15 11:38 AM  Result Value Ref Range   Cholesterol, Total 198 100 - 199 mg/dL   Triglycerides 72 0 - 149 mg/dL   HDL 62 >39 mg/dL    Comment: According to ATP-III Guidelines, HDL-C >59 mg/dL is considered a negative risk factor for CHD.    VLDL Cholesterol Cal 14 5 - 40 mg/dL   LDL Calculated 122 (H) 0 - 99 mg/dL   Chol/HDL Ratio 3.2 0.0 - 4.4 ratio units    Comment:                                   T. Chol/HDL Ratio                                             Men  Women                               1/2 Avg.Risk  3.4    3.3                                   Avg.Risk  5.0    4.4                                2X Avg.Risk  9.6    7.1  3X Avg.Risk 23.4   11.0      Assessment & Plan  1. Essential hypertension -cont. Lisinopril 2. Hyperlipidemia  -cont. Lovaststin 3. Malignant neoplasm of sigmoid colon ([place])  -check CMP on return to follow alk. p'tase.

## 2015-07-31 ENCOUNTER — Ambulatory Visit (INDEPENDENT_AMBULATORY_CARE_PROVIDER_SITE_OTHER): Payer: Medicare Other | Admitting: Family Medicine

## 2015-07-31 ENCOUNTER — Encounter: Payer: Self-pay | Admitting: Family Medicine

## 2015-07-31 VITALS — BP 150/75 | HR 62 | Resp 16 | Ht 65.0 in | Wt 182.0 lb

## 2015-07-31 DIAGNOSIS — I10 Essential (primary) hypertension: Secondary | ICD-10-CM

## 2015-07-31 MED ORDER — LISINOPRIL 40 MG PO TABS: 40.0000 mg | ORAL_TABLET | Freq: Every day | ORAL | Status: DC

## 2015-07-31 NOTE — Progress Notes (Signed)
Name: Vanessa Reynolds   MRN: [id number]    DOB: 06/03/1945   Date:07/31/2015       Progress Note  Subjective  Chief Complaint  Chief Complaint  Patient presents with  . Hypertension    Last labs in October    HPI Here for f/u of HBP.  She is taking meds and feels well.  No problem-specific assessment & plan notes found for this encounter.   Past Medical History  Diagnosis Date  . Personal history of tobacco use, presenting hazards to health   . H/O cystitis 2011  . Nausea with vomiting   . Acute parametritis and pelvic cellulitis   . Breast screening, unspecified   . Abdominal pain, left lower quadrant 2012  . Obesity, unspecified   . Personal history of malignant neoplasm of large intestine 2013  . 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    Past Surgical History  Procedure Laterality Date  . 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 Bilateral oophorectomy  December 2011    Completed at time of low anterior resection.  . Colonoscopy  [id number]    Dr. Vira Agar, Dr. Addison Lank)  . Appendectomy  December 2011    Family History  Problem Relation Age of Onset  . Hypertension Mother     deceased, age 30  . Cancer Father     gastric cancer, deceased, mid 27's    Social History   Social History  . Marital Status: Married    Spouse Name: N/A  . Number of Children: N/A  . Years of Education: N/A   Occupational History  . Not on file.   Social History Main Topics  . Smoking status: Former Smoker -- 1.00 packs/day for 30 years    Types: Cigarettes  . Smokeless tobacco: Never Used     Comment: quit in 1999  . Alcohol Use: No  . Drug Use: No  . Sexual Activity: Not on file   Other Topics Concern  . Not on file   Social History Narrative     Current outpatient prescriptions:  .  cholecalciferol (VITAMIN D) 1000 UNITS tablet, Take  1,000 Units by mouth daily., Disp: , Rfl:  .  lisinopril (PRINIVIL,ZESTRIL) 40 MG tablet, Take 1 tablet (40 mg total) by mouth daily., Disp: 30 tablet, Rfl: 6 .  lovastatin (MEVACOR) 10 MG tablet, , Disp: , Rfl:  .  Probiotic Product (PROBIOTIC DAILY PO), Take 1 tablet by mouth daily., Disp: , Rfl:   Allergies  Allergen Reactions  . Fish Oil Other (See Comments)    Diarrhea      Review of Systems  Constitutional: Negative for fever, chills, weight loss and malaise/fatigue.  HENT: Negative for hearing loss.   Eyes: Negative for blurred vision and double vision.  Respiratory: Negative for cough, shortness of breath and wheezing.   Cardiovascular: Negative for chest pain, palpitations and leg swelling.  Gastrointestinal: Negative for heartburn, abdominal pain and blood in stool.  Genitourinary: Negative for dysuria, urgency and frequency.  Musculoskeletal: Negative for myalgias and joint pain.  Skin: Negative for rash.  Neurological: Negative for dizziness, tremors, weakness and headaches.      Objective  Filed Vitals:   07/31/15 1121 07/31/15 1158  BP: 149/70 150/75  Pulse: 62   Resp: 16   Height: 5\' 5"  (1.651 m)   Weight: 182 lb (  82.555 kg)     Physical Exam  Constitutional: She is oriented to person, place, and time and well-developed, well-nourished, and in no distress. No distress.  HENT:  Head: Normocephalic and atraumatic.  Eyes: Conjunctivae and EOM are normal. Pupils are equal, round, and reactive to light. No scleral icterus.  Neck: Normal range of motion. Neck supple. Carotid bruit is not present. No thyromegaly present.  Cardiovascular: Normal rate, regular rhythm and normal heart sounds.  Exam reveals no gallop and no friction rub.   No murmur heard. Pulmonary/Chest: Effort normal and breath sounds normal. No respiratory distress. She has no wheezes. She has no rales.  Musculoskeletal: She exhibits no edema.  Lymphadenopathy:    She has no cervical  adenopathy.  Neurological: She is alert and oriented to person, place, and time.  Vitals reviewed.      No results found for this or any previous visit (from the past 2160 hour(s)).   Assessment & Plan  Problem List Items Addressed This Visit      Cardiovascular and Mediastinum   Essential hypertension - Primary   Relevant Medications   lisinopril (PRINIVIL,ZESTRIL) 40 MG tablet      Meds ordered this encounter  Medications  . lisinopril (PRINIVIL,ZESTRIL) 40 MG tablet    Sig: Take 1 tablet (40 mg total) by mouth daily.    Dispense:  30 tablet    Refill:  6   1. Essential hypertension  - lisinopril (PRINIVIL,ZESTRIL) 40 MG tablet; Take 1 tablet (40 mg total) by mouth daily.  Dispense: 30 tablet; Refill: 6

## 2015-08-07 ENCOUNTER — Other Ambulatory Visit: Payer: Self-pay | Admitting: Family Medicine

## 2015-10-02 ENCOUNTER — Ambulatory Visit: Payer: Self-pay | Admitting: Family Medicine

## 2015-10-23 ENCOUNTER — Ambulatory Visit (INDEPENDENT_AMBULATORY_CARE_PROVIDER_SITE_OTHER): Payer: Medicare Other | Admitting: Family Medicine

## 2015-10-23 ENCOUNTER — Encounter: Payer: Self-pay | Admitting: Family Medicine

## 2015-10-23 VITALS — BP 140/80 | HR 61 | Temp 98.4°F | Resp 16 | Ht 65.0 in | Wt 181.0 lb

## 2015-10-23 DIAGNOSIS — C187 Malignant neoplasm of sigmoid colon: Secondary | ICD-10-CM | POA: Diagnosis not present

## 2015-10-23 DIAGNOSIS — C20 Malignant neoplasm of rectum: Secondary | ICD-10-CM

## 2015-10-23 DIAGNOSIS — E785 Hyperlipidemia, unspecified: Secondary | ICD-10-CM

## 2015-10-23 DIAGNOSIS — I10 Essential (primary) hypertension: Secondary | ICD-10-CM | POA: Diagnosis not present

## 2015-10-23 MED ORDER — LOVASTATIN 10 MG PO TABS: 10.0000 mg | ORAL_TABLET | Freq: Every day | ORAL | Status: DC

## 2015-10-23 MED ORDER — LISINOPRIL 40 MG PO TABS: 40.0000 mg | ORAL_TABLET | Freq: Every day | ORAL | Status: DC

## 2015-10-23 NOTE — Progress Notes (Signed)
Name: Vanessa Reynolds   MRN: [id number]    DOB: 12-03-1945   Date:10/23/2015       Progress Note  Subjective  Chief Complaint  Chief Complaint  Patient presents with  . Hypertension    Hypertension Pertinent negatives include no blurred vision, chest pain, headaches, malaise/fatigue, palpitations or shortness of breath.   Here for f/u of HBP and elevated lipids.  She is taking her meds and feels well.  BPs at home in 110-120 sys range  No problem-specific assessment & plan notes found for this encounter.   Past Medical History  Diagnosis Date  . Personal history of tobacco use, presenting hazards to health   . H/O cystitis 2011  . Nausea with vomiting   . Acute parametritis and pelvic cellulitis   . Breast screening, unspecified   . Abdominal pain, left lower quadrant 2012  . Obesity, unspecified   . Personal history of malignant neoplasm of large intestine 2013  . 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    Past Surgical History  Procedure Laterality Date  . 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 Bilateral oophorectomy  December 2011    Completed at time of low anterior resection.  . Colonoscopy  [id number]    Dr. Vira Agar, Dr. Addison Lank)  . Appendectomy  December 2011    Family History  Problem Relation Age of Onset  . Hypertension Mother     deceased, age 37  . Cancer Father     gastric cancer, deceased, mid 71's    Social History   Social History  . Marital Status: Married    Spouse Name: N/A  . Number of Children: N/A  . Years of Education: N/A   Occupational History  . Not on file.   Social History Main Topics  . Smoking status: Former Smoker -- 1.00 packs/day for 30 years    Types: Cigarettes  . Smokeless tobacco: Never Used     Comment: quit in 1999  . Alcohol Use: No  . Drug Use: No  . Sexual Activity: Not on file    Other Topics Concern  . Not on file   Social History Narrative     Current outpatient prescriptions:  .  cholecalciferol (VITAMIN D) 1000 UNITS tablet, Take 1,000 Units by mouth daily., Disp: , Rfl:  .  lisinopril (PRINIVIL,ZESTRIL) 40 MG tablet, Take 1 tablet (40 mg total) by mouth daily., Disp: 90 tablet, Rfl: 3 .  lovastatin (MEVACOR) 10 MG tablet, Take 1 tablet (10 mg total) by mouth at bedtime., Disp: 90 tablet, Rfl: 3 .  Probiotic Product (PROBIOTIC DAILY PO), Take 1 tablet by mouth daily., Disp: , Rfl:   Allergies  Allergen Reactions  . Fish Oil Other (See Comments)    Diarrhea      Review of Systems  Constitutional: Negative for fever, chills, weight loss and malaise/fatigue.  HENT: Negative for hearing loss.   Eyes: Negative for blurred vision and double vision.  Respiratory: Negative for cough, shortness of breath and wheezing.   Cardiovascular: Negative for chest pain, palpitations and leg swelling.  Gastrointestinal: Negative for heartburn, abdominal pain and blood in stool.  Genitourinary: Negative for dysuria, urgency and frequency.  Musculoskeletal: Negative for myalgias and joint pain.  Skin: Negative for rash.  Neurological: Negative for dizziness, tremors, weakness and headaches.      Objective  Filed Vitals:   10/23/15 0854 10/23/15 0957  BP: 145/66 140/80  Pulse: 61   Temp: 98.4 F (36.9 C)   TempSrc: Oral   Resp: 16   Height: 5\' 5"  (1.651 m)   Weight: 181 lb (82.101 kg)     Physical Exam  Constitutional: She is oriented to person, place, and time and well-developed, well-nourished, and in no distress. No distress.  HENT:  Head: Normocephalic and atraumatic.  Eyes: Conjunctivae and EOM are normal. Pupils are equal, round, and reactive to light. No scleral icterus.  Neck: Normal range of motion. Neck supple. Carotid bruit is not present. No thyromegaly present.  Cardiovascular: Normal rate, regular rhythm and normal heart sounds.  Exam  reveals no gallop and no friction rub.   No murmur heard. Pulmonary/Chest: Effort normal and breath sounds normal. No respiratory distress. She has no wheezes. She has no rales.  Musculoskeletal: She exhibits no edema.  Lymphadenopathy:    She has no cervical adenopathy.  Neurological: She is alert and oriented to person, place, and time.  Vitals reviewed.      No results found for this or any previous visit (from the past 2160 hour(s)).   Assessment & Plan  Problem List Items Addressed This Visit      Cardiovascular and Mediastinum   Essential hypertension - Primary   Relevant Medications   lisinopril (PRINIVIL,ZESTRIL) 40 MG tablet   lovastatin (MEVACOR) 10 MG tablet   Other Relevant Orders   COMPLETE METABOLIC PANEL WITH GFR     Digestive   Colon cancer (HCC)   Relevant Orders   CBC with Differential   Rectal cancer (HCC)     Other   Hyperlipidemia   Relevant Medications   lisinopril (PRINIVIL,ZESTRIL) 40 MG tablet   lovastatin (MEVACOR) 10 MG tablet   Other Relevant Orders   Lipid Profile      Meds ordered this encounter  Medications  . lisinopril (PRINIVIL,ZESTRIL) 40 MG tablet    Sig: Take 1 tablet (40 mg total) by mouth daily.    Dispense:  90 tablet    Refill:  3  . lovastatin (MEVACOR) 10 MG tablet    Sig: Take 1 tablet (10 mg total) by mouth at bedtime.    Dispense:  90 tablet    Refill:  3   1. Essential hypertension  - lisinopril (PRINIVIL,ZESTRIL) 40 MG tablet; Take 1 tablet (40 mg total) by mouth daily.  Dispense: 90 tablet; Refill: 3 - COMPLETE METABOLIC PANEL WITH GFR  2. Hyperlipidemia  - lovastatin (MEVACOR) 10 MG tablet; Take 1 tablet (10 mg total) by mouth at bedtime.  Dispense: 90 tablet; Refill: 3 - Lipid Profile  3. Malignant neoplasm of sigmoid colon (HCC)  - CBC with Differential  4. Rectal cancer ([place])

## 2015-11-02 ENCOUNTER — Other Ambulatory Visit: Payer: Self-pay | Admitting: Family Medicine

## 2015-11-06 ENCOUNTER — Other Ambulatory Visit: Payer: Medicare Other

## 2015-11-07 ENCOUNTER — Other Ambulatory Visit: Payer: Medicare Other

## 2015-11-07 ENCOUNTER — Other Ambulatory Visit: Payer: Self-pay | Admitting: Family Medicine

## 2015-11-07 DIAGNOSIS — C187 Malignant neoplasm of sigmoid colon: Secondary | ICD-10-CM | POA: Diagnosis not present

## 2015-11-07 DIAGNOSIS — I10 Essential (primary) hypertension: Secondary | ICD-10-CM | POA: Diagnosis not present

## 2015-11-07 DIAGNOSIS — E785 Hyperlipidemia, unspecified: Secondary | ICD-10-CM | POA: Diagnosis not present

## 2015-11-08 LAB — CBC WITH DIFFERENTIAL/PLATELET
Basophils Absolute: 54 cells/uL (ref 0–200)
Basophils Relative: 1 %
Eosinophils Absolute: 324 cells/uL (ref 15–500)
Eosinophils Relative: 6 %
HCT: 39.3 % (ref 35.0–45.0)
Hemoglobin: 12.9 g/dL (ref 11.7–15.5)
Lymphocytes Relative: 30 %
Lymphs Abs: 1620 cells/uL (ref 850–3900)
MCH: 29.7 pg (ref 27.0–33.0)
MCHC: 32.8 g/dL (ref 32.0–36.0)
MCV: 90.6 fL (ref 80.0–100.0)
MPV: 12.7 fL — ABNORMAL HIGH (ref 7.5–12.5)
Monocytes Absolute: 432 cells/uL (ref 200–950)
Monocytes Relative: 8 %
Neutro Abs: 2970 cells/uL (ref 1500–7800)
Neutrophils Relative %: 55 %
Platelets: 178 10*3/uL (ref 140–400)
RBC: 4.34 MIL/uL (ref 3.80–5.10)
RDW: 13.3 % (ref 11.0–15.0)
WBC: 5.4 10*3/uL (ref 3.8–10.8)

## 2015-11-08 LAB — COMPLETE METABOLIC PANEL WITH GFR
ALT: 22 U/L (ref 6–29)
AST: 24 U/L (ref 10–35)
Albumin: 3.7 g/dL (ref 3.6–5.1)
Alkaline Phosphatase: 116 U/L (ref 33–130)
BUN: 14 mg/dL (ref 7–25)
CO2: 25 mmol/L (ref 20–31)
Calcium: 9 mg/dL (ref 8.6–10.4)
Chloride: 105 mmol/L (ref 98–110)
Creat: 0.78 mg/dL (ref 0.60–0.93)
GFR, Est African American: 88 mL/min (ref >=60)
GFR, Est Non African American: 77 mL/min (ref >=60)
Glucose, Bld: 92 mg/dL (ref 65–99)
Potassium: 4.3 mmol/L (ref 3.5–5.3)
Sodium: 141 mmol/L (ref 135–146)
Total Bilirubin: 0.6 mg/dL (ref 0.2–1.2)
Total Protein: 7.1 g/dL (ref 6.1–8.1)

## 2015-11-08 LAB — LIPID PANEL
Cholesterol: 186 mg/dL (ref 125–200)
HDL: 54 mg/dL (ref >=46)
LDL Cholesterol: 109 mg/dL (ref <130)
Total CHOL/HDL Ratio: 3.4 Ratio (ref <=5.0)
Triglycerides: 116 mg/dL (ref <150)
VLDL: 23 mg/dL (ref <30)

## 2015-12-11 ENCOUNTER — Encounter: Payer: Self-pay | Admitting: Family Medicine

## 2015-12-11 ENCOUNTER — Ambulatory Visit (INDEPENDENT_AMBULATORY_CARE_PROVIDER_SITE_OTHER): Payer: Medicare Other | Admitting: Family Medicine

## 2015-12-11 VITALS — BP 132/74 | HR 61 | Temp 98.0°F | Resp 16 | Ht 65.0 in | Wt 182.6 lb

## 2015-12-11 DIAGNOSIS — L0211 Cutaneous abscess of neck: Secondary | ICD-10-CM

## 2015-12-11 MED ORDER — DOXYCYCLINE HYCLATE 100 MG PO TABS: 100.0000 mg | ORAL_TABLET | Freq: Two times a day (BID) | ORAL | 0 refills | Status: DC

## 2015-12-11 NOTE — Progress Notes (Signed)
Subjective:    Patient ID: Vanessa Reynolds, female    DOB: May 02, 1946, 70 y.o.   MRN: [id number]  HPI: Vanessa Reynolds is a 70 y.o. female presenting on 12/11/2015 for Cyst (on back of her neck more Left side)   HPI  Pt presents of abscess of the neck. Present x 3 weeks at the base of the neck. No fevers. Did drain a few nights ago. Now "a head" on top. Home treatment- warm compress.   Past Medical History:  Diagnosis Date  . Abdominal pain, left lower quadrant 2012  . Acute parametritis and pelvic cellulitis   . Breast screening, unspecified   . H/O cystitis 2011  . Nausea with vomiting   . Obesity, unspecified   . Personal history of malignant neoplasm of large intestine 2013  . Personal history of tobacco use, presenting hazards to health   . 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    Current Outpatient Prescriptions on File Prior to Visit  Medication Sig  . cholecalciferol (VITAMIN D) 1000 UNITS tablet Take 1,000 Units by mouth daily.  Marland Kitchen lisinopril (PRINIVIL,ZESTRIL) 40 MG tablet Take 1 tablet (40 mg total) by mouth daily.  Marland Kitchen lovastatin (MEVACOR) 10 MG tablet Take 1 tablet (10 mg total) by mouth at bedtime.  . Probiotic Product (PROBIOTIC DAILY PO) Take 1 tablet by mouth daily.   No current facility-administered medications on file prior to visit.     Review of Systems  Constitutional: Negative for chills and fever.  HENT: Negative.   Respiratory: Negative for cough, chest tightness and wheezing.   Cardiovascular: Negative for chest pain and leg swelling.  Gastrointestinal: Negative for abdominal pain, constipation, diarrhea, nausea and vomiting.  Endocrine: Negative.  Negative for cold intolerance, heat intolerance, polydipsia, polyphagia and polyuria.  Genitourinary: Negative for difficulty urinating and dysuria.  Musculoskeletal: Negative.   Skin: Positive for wound.  Neurological: Negative for dizziness,  light-headedness and numbness.  Psychiatric/Behavioral: Negative.    Per HPI unless specifically indicated above     Objective:    BP 132/74 (BP Location: Right Arm)   Pulse 61   Temp 98 F (36.7 C) (Oral)   Resp 16   Ht 5' 5"  (1.651 m)   Wt 182 lb 9.6 oz (82.8 kg)   BMI 30.39 kg/m   Wt Readings from Last 3 Encounters:  12/11/15 182 lb 9.6 oz (82.8 kg)  10/23/15 181 lb (82.1 kg)  07/31/15 182 lb (82.6 kg)    Physical Exam  Constitutional: She is oriented to person, place, and time. She appears well-developed and well-nourished.  HENT:  Head: Normocephalic and atraumatic.  Neck: Neck supple.  Cardiovascular: Normal rate, regular rhythm and normal heart sounds.  Exam reveals no gallop and no friction rub.   No murmur heard. Pulmonary/Chest: Effort normal and breath sounds normal. She has no wheezes. She exhibits no tenderness.  Abdominal: Soft. Normal appearance and bowel sounds are normal. She exhibits no distension and no mass. There is no tenderness. There is no rebound and no guarding.  Musculoskeletal: Normal range of motion. She exhibits no edema or tenderness.  Lymphadenopathy:       Head (right side): Occipital (mildy tender enlarged) adenopathy present.       Head (left side): Occipital (mildy tender enlarged) adenopathy present.    She has no cervical adenopathy.  Neurological: She is alert and oriented to person, place, and time.  Skin: Skin is  warm and dry. Lesion noted. She is not diaphoretic. No pallor.  Small erythematous nodule with pustular center. Lanced to light pressure- purulent and bloody fluid. Packed with gauze and triple ointment applied.   Results for orders placed or performed in visit on 11/07/15  COMPLETE METABOLIC PANEL WITH GFR  Result Value Ref Range   Sodium 141 135 - 146 mmol/L   Potassium 4.3 3.5 - 5.3 mmol/L   Chloride 105 98 - 110 mmol/L   CO2 25 20 - 31 mmol/L   Glucose, Bld 92 65 - 99 mg/dL   BUN 14 7 - 25 mg/dL   Creat 0.78 0.60 -  0.93 mg/dL   Total Bilirubin 0.6 0.2 - 1.2 mg/dL   Alkaline Phosphatase 116 33 - 130 U/L   AST 24 10 - 35 U/L   ALT 22 6 - 29 U/L   Total Protein 7.1 6.1 - 8.1 g/dL   Albumin 3.7 3.6 - 5.1 g/dL   Calcium 9.0 8.6 - 10.4 mg/dL   GFR, Est African American 88 >=60 mL/min   GFR, Est Non African American 77 >=60 mL/min  CBC with Differential/Platelet  Result Value Ref Range   WBC 5.4 3.8 - 10.8 K/uL   RBC 4.34 3.80 - 5.10 MIL/uL   Hemoglobin 12.9 11.7 - 15.5 g/dL   HCT 39.3 35.0 - 45.0 %   MCV 90.6 80.0 - 100.0 fL   MCH 29.7 27.0 - 33.0 pg   MCHC 32.8 32.0 - 36.0 g/dL   RDW 13.3 11.0 - 15.0 %   Platelets 178 140 - 400 K/uL   MPV 12.7 (H) 7.5 - 12.5 fL   Neutro Abs 2,970 1,500 - 7,800 cells/uL   Lymphs Abs 1,620 850 - 3,900 cells/uL   Monocytes Absolute 432 200 - 950 cells/uL   Eosinophils Absolute 324 15 - 500 cells/uL   Basophils Absolute 54 0 - 200 cells/uL   Neutrophils Relative % 55 %   Lymphocytes Relative 30 %   Monocytes Relative 8 %   Eosinophils Relative 6 %   Basophils Relative 1 %   Smear Review Criteria for review not met   Lipid panel  Result Value Ref Range   Cholesterol 186 125 - 200 mg/dL   Triglycerides 116 <150 mg/dL   HDL 54 >=46 mg/dL   Total CHOL/HDL Ratio 3.4 <=5.0 Ratio   VLDL 23 <30 mg/dL   LDL Cholesterol 109 <130 mg/dL      Assessment & Plan:   Problem List Items Addressed This Visit    None    Visit Diagnoses    Abscess of skin of neck    -  Primary      Meds ordered this encounter  Medications  . doxycycline (VIBRA-TABS) 100 MG tablet    Sig: Take 1 tablet (100 mg total) by mouth 2 (two) times daily.    Dispense:  14 tablet    Refill:  0    Order Specific Question:   Supervising Provider    Answer:   Arlis Porta [[id number]]      Follow up plan: Return if symptoms worsen or fail to improve.

## 2015-12-11 NOTE — Patient Instructions (Addendum)
Abscess °An abscess (boil or furuncle) is an infected area on or under the skin. This area is filled with yellowish-white fluid (pus) and other material (debris). °HOME CARE  °· Only take medicines as told by your doctor. °· If you were given antibiotic medicine, take it as directed. Finish the medicine even if you start to feel better. °· If gauze is used, follow your doctor's directions for changing the gauze. °· To avoid spreading the infection: °¨ Keep your abscess covered with a bandage. °¨ Wash your hands well. °¨ Do not share personal care items, towels, or whirlpools with others. °¨ Avoid skin contact with others. °· Keep your skin and clothes clean around the abscess. °· Keep all doctor visits as told. °GET HELP RIGHT AWAY IF:  °· You have more pain, puffiness (swelling), or redness in the wound site. °· You have more fluid or blood coming from the wound site. °· You have muscle aches, chills, or you feel sick. °· You have a fever. °MAKE SURE YOU:  °· Understand these instructions. °· Will watch your condition. °· Will get help right away if you are not doing well or get worse. °  °This information is not intended to replace advice given to you by your health care provider. Make sure you discuss any questions you have with your health care provider. °  °Document Released: 10/23/2007 Document Revised: 11/05/2011 Document Reviewed: 07/20/2011 °Elsevier Interactive Patient Education ©2016 [employer] ° °

## 2015-12-13 ENCOUNTER — Ambulatory Visit (INDEPENDENT_AMBULATORY_CARE_PROVIDER_SITE_OTHER): Payer: Medicare Other | Admitting: Family Medicine

## 2015-12-13 VITALS — BP 152/76 | HR 62 | Temp 98.3°F | Resp 16 | Ht 65.0 in | Wt 182.0 lb

## 2015-12-13 DIAGNOSIS — L0211 Cutaneous abscess of neck: Secondary | ICD-10-CM

## 2015-12-13 NOTE — Progress Notes (Signed)
Subjective:    Patient ID: VLASTA BASKIN, female    DOB: 09/02/1945, 70 y.o.   MRN: [id number]  HPI: Vanessa Reynolds is a 70 y.o. female presenting on 12/13/2015 for Cyst (follow up)   HPI  Pt presents for wound check follow-ing I and D on Monday. Doing well. Site is smaller. No fevers. She is concerned the packing is stuck because her husband could not find it when he changed the bandage.   Past Medical History:  Diagnosis Date  . Abdominal pain, left lower quadrant 2012  . Acute parametritis and pelvic cellulitis   . Breast screening, unspecified   . H/O cystitis 2011  . Nausea with vomiting   . Obesity, unspecified   . Personal history of malignant neoplasm of large intestine 2013  . Personal history of tobacco use, presenting hazards to health   . 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    Current Outpatient Prescriptions on File Prior to Visit  Medication Sig  . cholecalciferol (VITAMIN D) 1000 UNITS tablet Take 1,000 Units by mouth daily.  Marland Kitchen doxycycline (VIBRA-TABS) 100 MG tablet Take 1 tablet (100 mg total) by mouth 2 (two) times daily.  Marland Kitchen lisinopril (PRINIVIL,ZESTRIL) 40 MG tablet Take 1 tablet (40 mg total) by mouth daily.  Marland Kitchen lovastatin (MEVACOR) 10 MG tablet Take 1 tablet (10 mg total) by mouth at bedtime.  . Probiotic Product (PROBIOTIC DAILY PO) Take 1 tablet by mouth daily.   No current facility-administered medications on file prior to visit.     Review of Systems Per HPI unless specifically indicated above     Objective:    BP (!) 152/76 (BP Location: Left Arm, Patient Position: Sitting, Cuff Size: Normal)   Pulse 62   Temp 98.3 F (36.8 C) (Oral)   Resp 16   Ht _0  (1.651 m)   Wt 182 lb (82.6 kg)   BMI 30.29 kg/m   Wt Readings from Last 3 Encounters:  12/13/15 182 lb (82.6 kg)  12/11/15 182 lb 9.6 oz (82.8 kg)  10/23/15 181 lb (82.1 kg)    Physical Exam  Skin:  Healing erythematous nodule  on the back of the neck at the base of the skull.    Results for orders placed or performed in visit on 11/07/15  COMPLETE METABOLIC PANEL WITH GFR  Result Value Ref Range   Sodium 141 135 - 146 mmol/L   Potassium 4.3 3.5 - 5.3 mmol/L   Chloride 105 98 - 110 mmol/L   CO2 25 20 - 31 mmol/L   Glucose, Bld 92 65 - 99 mg/dL   BUN 14 7 - 25 mg/dL   Creat 0.78 0.60 - 0.93 mg/dL   Total Bilirubin 0.6 0.2 - 1.2 mg/dL   Alkaline Phosphatase 116 33 - 130 U/L   AST 24 10 - 35 U/L   ALT 22 6 - 29 U/L   Total Protein 7.1 6.1 - 8.1 g/dL   Albumin 3.7 3.6 - 5.1 g/dL   Calcium 9.0 8.6 - 10.4 mg/dL   GFR, Est African American 88 >=60 mL/min   GFR, Est Non African American 77 >=60 mL/min  CBC with Differential/Platelet  Result Value Ref Range   WBC 5.4 3.8 - 10.8 K/uL   RBC 4.34 3.80 - 5.10 MIL/uL   Hemoglobin 12.9 11.7 - 15.5 g/dL   HCT 39.3 35.0 - 45.0 %   MCV 90.6 80.0 - 100.0 fL  MCH 29.7 27.0 - 33.0 pg   MCHC 32.8 32.0 - 36.0 g/dL   RDW 13.3 11.0 - 15.0 %   Platelets 178 140 - 400 K/uL   MPV 12.7 (H) 7.5 - 12.5 fL   Neutro Abs 2,970 1,500 - 7,800 cells/uL   Lymphs Abs 1,620 850 - 3,900 cells/uL   Monocytes Absolute 432 200 - 950 cells/uL   Eosinophils Absolute 324 15 - 500 cells/uL   Basophils Absolute 54 0 - 200 cells/uL   Neutrophils Relative % 55 %   Lymphocytes Relative 30 %   Monocytes Relative 8 %   Eosinophils Relative 6 %   Basophils Relative 1 %   Smear Review Criteria for review not met   Lipid panel  Result Value Ref Range   Cholesterol 186 125 - 200 mg/dL   Triglycerides 116 <150 mg/dL   HDL 54 >=46 mg/dL   Total CHOL/HDL Ratio 3.4 <=5.0 Ratio   VLDL 23 <30 mg/dL   LDL Cholesterol 109 <130 mg/dL      Assessment & Plan:   Problem List Items Addressed This Visit    None    Visit Diagnoses   None.     No orders of the defined types were placed in this encounter.     Follow up plan: No Follow-up on file.

## 2015-12-16 ENCOUNTER — Other Ambulatory Visit: Payer: Self-pay | Admitting: Family Medicine

## 2016-01-02 ENCOUNTER — Other Ambulatory Visit: Payer: Self-pay | Admitting: General Surgery

## 2016-01-02 ENCOUNTER — Encounter: Payer: Self-pay | Admitting: *Deleted

## 2016-01-02 DIAGNOSIS — C20 Malignant neoplasm of rectum: Secondary | ICD-10-CM

## 2016-02-13 ENCOUNTER — Encounter: Payer: Self-pay | Admitting: *Deleted

## 2016-02-16 DIAGNOSIS — C20 Malignant neoplasm of rectum: Secondary | ICD-10-CM | POA: Diagnosis not present

## 2016-02-17 LAB — CEA: CEA: 2.1 ng/mL (ref 0.0–4.7)

## 2016-02-19 ENCOUNTER — Encounter: Payer: Self-pay | Admitting: General Surgery

## 2016-02-19 ENCOUNTER — Ambulatory Visit (INDEPENDENT_AMBULATORY_CARE_PROVIDER_SITE_OTHER): Payer: Medicare Other | Admitting: General Surgery

## 2016-02-19 VITALS — BP 140/76 | HR 62 | Resp 12 | Ht 64.0 in | Wt 181.0 lb

## 2016-02-19 DIAGNOSIS — C20 Malignant neoplasm of rectum: Secondary | ICD-10-CM

## 2016-02-19 NOTE — Patient Instructions (Addendum)
Patient aware to call with any questions or concerns. Patient to have a CEA in 6 months.

## 2016-02-19 NOTE — Progress Notes (Signed)
Patient ID: Vanessa Reynolds, female   DOB: 12/19/1945, 70 y.o.   MRN: [id number]  Chief Complaint  Patient presents with  . Colon Cancer    HPI Vanessa Reynolds is a 70 y.o. female here follow up for her colon resection completed in December 2011 for a focal adenocarcinoma developing in a tubular adenoma, T1, N0..  . No GI issues, denies bleeding. Moves her bowels regularly. She had her CEA completed on 02/16/16. HPI  Past Medical History:  Diagnosis Date  . Abdominal pain, left lower quadrant 2012  . Acute parametritis and pelvic cellulitis   . Breast screening, unspecified   . H/O cystitis 2011  . Nausea with vomiting   . Obesity, unspecified   . Personal history of malignant neoplasm of large intestine 2013  . Personal history of tobacco use, presenting hazards to health   . 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    Past Surgical History:  Procedure Laterality Date  . APPENDECTOMY  December 2011  . BILATERAL OOPHORECTOMY  December 2011   Completed at time of low anterior resection.  . COLON SURGERY  03/14/10   lap assisted low anterior resection-adenocarcinoma of the rectum and a villous adenoma  . COLONOSCOPY  [id number]   Dr. Vira Agar, Dr. Addison Lank)    Family History  Problem Relation Age of Onset  . Hypertension Mother     deceased, age 72  . Cancer Father     gastric cancer, deceased, mid 72's    Social History Social History  Substance Use Topics  . Smoking status: Former Smoker    Packs/day: 1.00    Years: 30.00    Types: Cigarettes  . Smokeless tobacco: Never Used     Comment: quit in 1999  . Alcohol use No    Allergies  Allergen Reactions  . Fish Oil Other (See Comments)    Diarrhea     Current Outpatient Prescriptions  Medication Sig Dispense Refill  . cholecalciferol (VITAMIN D) 1000 UNITS tablet Take 1,000 Units by mouth daily.    Marland Kitchen lisinopril (PRINIVIL,ZESTRIL) 40 MG tablet Take 1 tablet  (40 mg total) by mouth daily. 90 tablet 3  . lovastatin (MEVACOR) 10 MG tablet Take 1 tablet (10 mg total) by mouth at bedtime. 90 tablet 3  . Probiotic Product (PROBIOTIC DAILY PO) Take 1 tablet by mouth daily.     No current facility-administered medications for this visit.     Review of Systems Review of Systems  Constitutional: Negative.   Respiratory: Negative.   Cardiovascular: Negative.   Gastrointestinal: Negative.     Blood pressure 140/76, pulse 62, resp. rate 12, height 5\' 4"  (1.626 m), weight 181 lb (82.1 kg).  Physical Exam Physical Exam  Constitutional: She is oriented to person, place, and time. She appears well-developed and well-nourished.  Eyes: Conjunctivae are normal. No scleral icterus.  Neck: Neck supple. No thyromegaly present.  Cardiovascular: Normal rate, regular rhythm and normal heart sounds.   Pulmonary/Chest: Effort normal and breath sounds normal.  Abdominal: Soft. Bowel sounds are normal. There is no tenderness.  Well healed scar   Neurological: She is alert and oriented to person, place, and time.  Skin: Skin is warm and dry.    Data Reviewed Serum CEA obtained 02/16/2016 had risen from 1.3-2.1 over the last year.  Colonoscopy completed 04/20/2014 was notable only for diverticulosis.  Assessment    Doing well status post low anterior resection. Minimal  CEA elevation.    Plan    We'll arrange for repeat CEA in 6 month as well as in 12 months.  Office follow-up in 12 months.     Patient to get CEA in 6 months, no office visit needed if normal. Follow up in one year.   This information has been scribed by Verlene Mayer, CMA     Robert Bellow 02/20/2016, 8:50 PM

## 2016-02-20 ENCOUNTER — Encounter: Payer: Self-pay | Admitting: General Surgery

## 2016-03-04 ENCOUNTER — Encounter: Payer: Self-pay | Admitting: Family Medicine

## 2016-03-04 ENCOUNTER — Ambulatory Visit (INDEPENDENT_AMBULATORY_CARE_PROVIDER_SITE_OTHER): Payer: Medicare Other | Admitting: Family Medicine

## 2016-03-04 VITALS — BP 140/70 | HR 60 | Temp 98.2°F | Resp 16 | Ht 64.0 in | Wt 180.0 lb

## 2016-03-04 DIAGNOSIS — I10 Essential (primary) hypertension: Secondary | ICD-10-CM

## 2016-03-04 DIAGNOSIS — Z23 Encounter for immunization: Secondary | ICD-10-CM

## 2016-03-04 DIAGNOSIS — E7849 Other hyperlipidemia: Secondary | ICD-10-CM

## 2016-03-04 DIAGNOSIS — E784 Other hyperlipidemia: Secondary | ICD-10-CM | POA: Diagnosis not present

## 2016-03-04 NOTE — Progress Notes (Signed)
Name: Vanessa Reynolds   MRN: [id number]    DOB: January 27, 1946   Date:03/04/2016       Progress Note  Subjective  Chief Complaint  Chief Complaint  Patient presents with  . Hypertension    HPI Here for f/u of HBP.  She is taking her Lisinopril and lipid medicine.   She has no c/o.  No problem-specific Assessment & Plan notes found for this encounter.   Past Medical History:  Diagnosis Date  . Abdominal pain, left lower quadrant 2012  . Acute parametritis and pelvic cellulitis   . Breast screening, unspecified   . H/O cystitis 2011  . Nausea with vomiting   . Obesity, unspecified   . Personal history of malignant neoplasm of large intestine 2013  . Personal history of tobacco use, presenting hazards to health   . 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    Past Surgical History:  Procedure Laterality Date  . APPENDECTOMY  December 2011  . BILATERAL OOPHORECTOMY  December 2011   Completed at time of low anterior resection.  . COLON SURGERY  03/14/10   lap assisted low anterior resection-adenocarcinoma of the rectum and a villous adenoma  . COLONOSCOPY  [id number]   Dr. Vira Agar, Dr. Addison Lank)    Family History  Problem Relation Age of Onset  . Hypertension Mother     deceased, age 74  . Cancer Father     gastric cancer, deceased, mid 39's    Social History   Social History  . Marital status: Married    Spouse name: N/A  . Number of children: N/A  . Years of education: N/A   Occupational History  . Not on file.   Social History Main Topics  . Smoking status: Former Smoker    Packs/day: 1.00    Years: 30.00    Types: Cigarettes  . Smokeless tobacco: Never Used     Comment: quit in 1999  . Alcohol use No  . Drug use: No  . Sexual activity: Not on file   Other Topics Concern  . Not on file   Social History Narrative  . No narrative on file     Current Outpatient Prescriptions:  .  cholecalciferol  (VITAMIN D) 1000 UNITS tablet, Take 1,000 Units by mouth daily., Disp: , Rfl:  .  lisinopril (PRINIVIL,ZESTRIL) 40 MG tablet, Take 1 tablet (40 mg total) by mouth daily., Disp: 90 tablet, Rfl: 3 .  lovastatin (MEVACOR) 10 MG tablet, Take 1 tablet (10 mg total) by mouth at bedtime., Disp: 90 tablet, Rfl: 3 .  Probiotic Product (PROBIOTIC DAILY PO), Take 1 tablet by mouth daily., Disp: , Rfl:   Allergies  Allergen Reactions  . Fish Oil Other (See Comments)    Diarrhea      Review of Systems  Constitutional: Negative for chills, fever, malaise/fatigue and weight loss.  HENT: Negative for hearing loss.   Eyes: Negative for blurred vision and double vision.  Respiratory: Negative for cough, shortness of breath and wheezing.   Cardiovascular: Negative for chest pain, palpitations and leg swelling.  Gastrointestinal: Negative for abdominal pain, blood in stool and heartburn.  Genitourinary: Negative for dysuria, frequency and urgency.  Musculoskeletal: Negative for back pain, joint pain and myalgias.  Skin: Negative for rash.  Neurological: Negative for dizziness, tremors, weakness and headaches.      Objective  Vitals:   03/04/16 0920 03/04/16 0948  BP: (!) 171/83 140/70  Pulse:  60   Resp: 16   Temp: 98.2 F (36.8 C)   TempSrc: Oral   Weight: 180 lb (81.6 kg)   Height: 5\' 4"  (1.626 m)     Physical Exam  Constitutional: She is oriented to person, place, and time and well-developed, well-nourished, and in no distress. No distress.  HENT:  Head: Normocephalic and atraumatic.  Eyes: Conjunctivae and EOM are normal. Pupils are equal, round, and reactive to light. No scleral icterus.  Neck: Normal range of motion. Neck supple. Carotid bruit is not present. No thyromegaly present.  Cardiovascular: Normal rate, regular rhythm and normal heart sounds.  Exam reveals no gallop and no friction rub.   No murmur heard. Pulmonary/Chest: Effort normal and breath sounds normal. No  respiratory distress. She has no wheezes. She has no rales.  Abdominal: Soft. Bowel sounds are normal. She exhibits no distension, no abdominal bruit and no mass. There is no tenderness.  Musculoskeletal: She exhibits no edema.  Lymphadenopathy:    She has no cervical adenopathy.  Neurological: She is alert and oriented to person, place, and time.  Vitals reviewed.      Recent Results (from the past 2160 hour(s))  CEA     Status: None   Collection Time: 02/16/16 12:19 PM  Result Value Ref Range   CEA 2.1 0.0 - 4.7 ng/mL    Comment:        Roche ECLIA methodology       Nonsmokers  <3.9                                      Smokers     <5.6      Assessment & Plan  Problem List Items Addressed This Visit      Cardiovascular and Mediastinum   Essential hypertension     Other   Hyperlipidemia    Other Visit Diagnoses    Need for vaccination    -  Primary   Relevant Orders   Flu vaccine HIGH DOSE PF (Fluzone High dose) (Completed)      No orders of the defined types were placed in this encounter.  1. Need for vaccination  - Flu vaccine HIGH DOSE PF (Fluzone High dose)  2. Essential hypertension Cont Lisinopril  3. Other hyperlipidemia Cont Mevacor

## 2016-06-03 ENCOUNTER — Encounter: Payer: Self-pay | Admitting: Family Medicine

## 2016-06-03 ENCOUNTER — Ambulatory Visit (INDEPENDENT_AMBULATORY_CARE_PROVIDER_SITE_OTHER): Payer: Medicare Other | Admitting: Family Medicine

## 2016-06-03 VITALS — BP 160/80 | HR 63 | Temp 98.0°F | Resp 16 | Ht 64.0 in | Wt 180.0 lb

## 2016-06-03 DIAGNOSIS — E784 Other hyperlipidemia: Secondary | ICD-10-CM | POA: Diagnosis not present

## 2016-06-03 DIAGNOSIS — I10 Essential (primary) hypertension: Secondary | ICD-10-CM | POA: Diagnosis not present

## 2016-06-03 DIAGNOSIS — E7849 Other hyperlipidemia: Secondary | ICD-10-CM

## 2016-06-03 DIAGNOSIS — Z1231 Encounter for screening mammogram for malignant neoplasm of breast: Secondary | ICD-10-CM | POA: Diagnosis not present

## 2016-06-03 MED ORDER — HYDROCHLOROTHIAZIDE 12.5 MG PO TABS: 12.5000 mg | ORAL_TABLET | Freq: Every day | ORAL | 6 refills | Status: DC

## 2016-06-03 NOTE — Progress Notes (Signed)
Name: Vanessa Reynolds   MRN: [id number]    DOB: 1945/10/28   Date:06/03/2016       Progress Note  Subjective  Chief Complaint  Chief Complaint  Patient presents with  . Hypertension    HPI Here for f/u of HBP and elevated lipids.  Needs Mammogram done this year.  She is feeling well and takes all meds.   BPs at home in 125/75 range.   No problem-specific Assessment & Plan notes found for this encounter.   Past Medical History:  Diagnosis Date  . Abdominal pain, left lower quadrant 2012  . Acute parametritis and pelvic cellulitis   . Breast screening, unspecified   . H/O cystitis 2011  . Nausea with vomiting   . Obesity, unspecified   . Personal history of malignant neoplasm of large intestine 2013  . Personal history of tobacco use, presenting hazards to health   . 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    Past Surgical History:  Procedure Laterality Date  . APPENDECTOMY  December 2011  . BILATERAL OOPHORECTOMY  December 2011   Completed at time of low anterior resection.  . COLON SURGERY  03/14/10   lap assisted low anterior resection-adenocarcinoma of the rectum and a villous adenoma  . COLONOSCOPY  [id number]   Dr. Vira Agar, Dr. Addison Lank)    Family History  Problem Relation Age of Onset  . Hypertension Mother     deceased, age 82  . Cancer Father     gastric cancer, deceased, mid 57's    Social History   Social History  . Marital status: Married    Spouse name: N/A  . Number of children: N/A  . Years of education: N/A   Occupational History  . Not on file.   Social History Main Topics  . Smoking status: Former Smoker    Packs/day: 1.00    Years: 30.00    Types: Cigarettes  . Smokeless tobacco: Never Used     Comment: quit in 1999  . Alcohol use No  . Drug use: No  . Sexual activity: Not on file   Other Topics Concern  . Not on file   Social History Narrative  . No narrative on file      Current Outpatient Prescriptions:  .  cholecalciferol (VITAMIN D) 1000 UNITS tablet, Take 1,000 Units by mouth daily., Disp: , Rfl:  .  hydrochlorothiazide (HYDRODIURIL) 12.5 MG tablet, Take 1 tablet (12.5 mg total) by mouth daily., Disp: 30 tablet, Rfl: 6 .  lisinopril (PRINIVIL,ZESTRIL) 40 MG tablet, Take 1 tablet (40 mg total) by mouth daily., Disp: 90 tablet, Rfl: 3 .  lovastatin (MEVACOR) 10 MG tablet, Take 1 tablet (10 mg total) by mouth at bedtime., Disp: 90 tablet, Rfl: 3 .  Probiotic Product (PROBIOTIC DAILY PO), Take 1 tablet by mouth daily., Disp: , Rfl:   Allergies  Allergen Reactions  . Fish Oil Other (See Comments)    Diarrhea      Review of Systems  Constitutional: Negative for chills, fever, malaise/fatigue and weight loss.  HENT: Negative for hearing loss and tinnitus.   Eyes: Negative for blurred vision and double vision.  Respiratory: Negative for shortness of breath and wheezing.   Cardiovascular: Negative for chest pain, palpitations and leg swelling.  Gastrointestinal: Negative for abdominal pain, blood in stool and heartburn.  Genitourinary: Negative for dysuria, frequency and urgency.  Musculoskeletal: Negative for joint pain and myalgias.  Neurological: Negative  for dizziness, tingling, tremors, weakness and headaches.      Objective  Vitals:   06/03/16 0924 06/03/16 0950  BP: (!) 169/83 (!) 160/80  Pulse: 63   Resp: 16   Temp: 98 F (36.7 C)   TempSrc: Oral   Weight: 180 lb (81.6 kg)   Height: 5\' 4"  (1.626 m)     Physical Exam  Constitutional: She is oriented to person, place, and time and well-developed, well-nourished, and in no distress.  HENT:  Head: Normocephalic and atraumatic.  Eyes: Conjunctivae and EOM are normal. Pupils are equal, round, and reactive to light. No scleral icterus.  Neck: Normal range of motion. Neck supple. Carotid bruit is not present. No thyromegaly present.  Cardiovascular: Normal rate, regular rhythm and  normal heart sounds.  Exam reveals no gallop and no friction rub.   No murmur heard. Pulmonary/Chest: Effort normal and breath sounds normal. No respiratory distress. She has no wheezes. She has no rales.  Abdominal: Soft. Bowel sounds are normal. She exhibits no distension, no abdominal bruit and no mass. There is no tenderness.  Musculoskeletal: She exhibits no edema.  Lymphadenopathy:    She has no cervical adenopathy.  Neurological: She is alert and oriented to person, place, and time.  Vitals reviewed.      No results found for this or any previous visit (from the past 2160 hour(s)).   Assessment & Plan  Problem List Items Addressed This Visit      Cardiovascular and Mediastinum   Essential hypertension - Primary   Relevant Medications   hydrochlorothiazide (HYDRODIURIL) 12.5 MG tablet     Other   Hyperlipidemia   Relevant Medications   hydrochlorothiazide (HYDRODIURIL) 12.5 MG tablet      Meds ordered this encounter  Medications  . hydrochlorothiazide (HYDRODIURIL) 12.5 MG tablet    Sig: Take 1 tablet (12.5 mg total) by mouth daily.    Dispense:  30 tablet    Refill:  6   1. Essential hypertension Cont Lisinopril - hydrochlorothiazide (HYDRODIURIL) 12.5 MG tablet; Take 1 tablet (12.5 mg total) by mouth daily.  Dispense: 30 tablet; Refill: 6  2. Other hyperlipidemia Cont Mevacor

## 2016-06-12 ENCOUNTER — Telehealth: Payer: Self-pay | Admitting: Family Medicine

## 2016-06-12 NOTE — Telephone Encounter (Signed)
Pt. Called states that she stop taken hydrodiuril 12.5 (took meds for 1 week)  States that her  BP dropped. Pt call back # is [phone]

## 2016-06-13 NOTE — Telephone Encounter (Signed)
Patient aware.Vanessa Reynolds 

## 2016-06-13 NOTE — Telephone Encounter (Signed)
May stay off med until her appt on Mon., 06/17/16.  Cont to check BPs.-jh

## 2016-06-17 ENCOUNTER — Telehealth: Payer: Self-pay | Admitting: Family Medicine

## 2016-06-17 ENCOUNTER — Other Ambulatory Visit: Payer: Medicare Other

## 2016-06-17 DIAGNOSIS — E784 Other hyperlipidemia: Secondary | ICD-10-CM | POA: Diagnosis not present

## 2016-06-17 DIAGNOSIS — I10 Essential (primary) hypertension: Secondary | ICD-10-CM | POA: Diagnosis not present

## 2016-06-17 NOTE — Telephone Encounter (Signed)
Noted-jh 

## 2016-06-17 NOTE — Telephone Encounter (Signed)
Pt was here to get lab work done and states that Dr. Luan Pulling wanted her to check blood pressure and her manual reading is 140/90 advised pt that range is little high normal no medication changes needed this time as per Dr. Luan Pulling pt understand very well.

## 2016-06-18 ENCOUNTER — Other Ambulatory Visit: Payer: Self-pay | Admitting: Family Medicine

## 2016-06-18 DIAGNOSIS — E7849 Other hyperlipidemia: Secondary | ICD-10-CM

## 2016-06-18 LAB — CBC WITH DIFFERENTIAL/PLATELET
Basophils Absolute: 54 cells/uL (ref 0–200)
Basophils Relative: 1 %
Eosinophils Absolute: 378 cells/uL (ref 15–500)
Eosinophils Relative: 7 %
HCT: 38.8 % (ref 35.0–45.0)
Hemoglobin: 12.9 g/dL (ref 11.7–15.5)
Lymphocytes Relative: 28 %
Lymphs Abs: 1512 cells/uL (ref 850–3900)
MCH: 30.4 pg (ref 27.0–33.0)
MCHC: 33.2 g/dL (ref 32.0–36.0)
MCV: 91.3 fL (ref 80.0–100.0)
MPV: 12.6 fL — ABNORMAL HIGH (ref 7.5–12.5)
Monocytes Absolute: 270 cells/uL (ref 200–950)
Monocytes Relative: 5 %
Neutro Abs: 3186 cells/uL (ref 1500–7800)
Neutrophils Relative %: 59 %
Platelets: 207 10*3/uL (ref 140–400)
RBC: 4.25 MIL/uL (ref 3.80–5.10)
RDW: 13.3 % (ref 11.0–15.0)
WBC: 5.4 10*3/uL (ref 3.8–10.8)

## 2016-06-18 LAB — LIPID PANEL
Cholesterol: 202 mg/dL — ABNORMAL HIGH (ref <200)
HDL: 60 mg/dL (ref >50)
LDL Cholesterol: 126 mg/dL — ABNORMAL HIGH (ref <100)
Total CHOL/HDL Ratio: 3.4 Ratio (ref <5.0)
Triglycerides: 82 mg/dL (ref <150)
VLDL: 16 mg/dL (ref <30)

## 2016-06-18 LAB — COMPLETE METABOLIC PANEL WITH GFR
ALT: 20 U/L (ref 6–29)
AST: 26 U/L (ref 10–35)
Albumin: 3.7 g/dL (ref 3.6–5.1)
Alkaline Phosphatase: 117 U/L (ref 33–130)
BUN: 24 mg/dL (ref 7–25)
CO2: 28 mmol/L (ref 20–31)
Calcium: 9.5 mg/dL (ref 8.6–10.4)
Chloride: 106 mmol/L (ref 98–110)
Creat: 0.84 mg/dL (ref 0.60–0.93)
GFR, Est African American: 81 mL/min (ref >=60)
GFR, Est Non African American: 70 mL/min (ref >=60)
Glucose, Bld: 89 mg/dL (ref 65–99)
Potassium: 4.3 mmol/L (ref 3.5–5.3)
Sodium: 139 mmol/L (ref 135–146)
Total Bilirubin: 0.4 mg/dL (ref 0.2–1.2)
Total Protein: 7.7 g/dL (ref 6.1–8.1)

## 2016-06-18 MED ORDER — LOVASTATIN 20 MG PO TABS: 20.0000 mg | ORAL_TABLET | Freq: Every day | ORAL | 3 refills | Status: DC

## 2016-07-15 ENCOUNTER — Encounter: Payer: Self-pay | Admitting: Family Medicine

## 2016-07-15 ENCOUNTER — Ambulatory Visit: Payer: Self-pay

## 2016-07-15 ENCOUNTER — Ambulatory Visit (INDEPENDENT_AMBULATORY_CARE_PROVIDER_SITE_OTHER): Payer: Medicare Other | Admitting: Family Medicine

## 2016-07-15 VITALS — BP 155/80 | HR 63 | Temp 98.4°F | Resp 16 | Ht 64.0 in | Wt 180.0 lb

## 2016-07-15 DIAGNOSIS — I10 Essential (primary) hypertension: Secondary | ICD-10-CM | POA: Diagnosis not present

## 2016-07-15 MED ORDER — AMLODIPINE BESYLATE 2.5 MG PO TABS: 2.5000 mg | ORAL_TABLET | Freq: Every day | ORAL | 6 refills | Status: DC

## 2016-07-15 NOTE — Progress Notes (Signed)
Name: Vanessa Reynolds   MRN: [id number]    DOB: [birth year]   Date:07/15/2016       Progress Note  Subjective  Chief Complaint  Chief Complaint  Patient presents with  . Hypertension    6 wk f/u    HPI Here for f/u of HBP.  She had stopped her HCTZ after about 2 weeks because of low BP.  Feels better now. No problem-specific Assessment & Plan notes found for this encounter.   Past Medical History:  Diagnosis Date  . Abdominal pain, left lower quadrant 2012  . Acute parametritis and pelvic cellulitis   . Breast screening, unspecified   . H/O cystitis 2011  . Nausea with vomiting   . Obesity, unspecified   . Personal history of malignant neoplasm of large intestine 2013  . Personal history of tobacco use, presenting hazards to health   . 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    Past Surgical History:  Procedure Laterality Date  . APPENDECTOMY  December 2011  . BILATERAL OOPHORECTOMY  December 2011   Completed at time of low anterior resection.  . COLON SURGERY  03/14/10   lap assisted low anterior resection-adenocarcinoma of the rectum and a villous adenoma  . COLONOSCOPY  [id number]   Dr. Vira Agar, Dr. Addison Lank)    Family History  Problem Relation Age of Onset  . Hypertension Mother     deceased, age 26  . Cancer Father     gastric cancer, deceased, mid 41's    Social History   Social History  . Marital status: Married    Spouse name: N/A  . Number of children: N/A  . Years of education: N/A   Occupational History  . Not on file.   Social History Main Topics  . Smoking status: Former Smoker    Packs/day: 1.00    Years: 30.00    Types: Cigarettes  . Smokeless tobacco: Never Used     Comment: quit in 1999  . Alcohol use No  . Drug use: No  . Sexual activity: Not on file   Other Topics Concern  . Not on file   Social History Narrative  . No narrative on file     Current Outpatient Prescriptions:   .  cholecalciferol (VITAMIN D) 1000 UNITS tablet, Take 1,000 Units by mouth daily., Disp: , Rfl:  .  lisinopril (PRINIVIL,ZESTRIL) 40 MG tablet, Take 1 tablet (40 mg total) by mouth daily., Disp: 90 tablet, Rfl: 3 .  lovastatin (MEVACOR) 20 MG tablet, Take 1 tablet (20 mg total) by mouth at bedtime., Disp: 90 tablet, Rfl: 3 .  Probiotic Product (PROBIOTIC DAILY PO), Take 1 tablet by mouth daily., Disp: , Rfl:  .  amLODipine (NORVASC) 2.5 MG tablet, Take 1 tablet (2.5 mg total) by mouth daily., Disp: 30 tablet, Rfl: 6  Allergies  Allergen Reactions  . Fish Oil Other (See Comments)    Diarrhea      Review of Systems  Constitutional: Negative for chills, fever and malaise/fatigue.  HENT: Negative for hearing loss and tinnitus.   Eyes: Negative for blurred vision and double vision.  Respiratory: Negative for cough and wheezing.   Cardiovascular: Negative for chest pain, palpitations and leg swelling.  Gastrointestinal: Negative for abdominal pain, blood in stool and heartburn.  Genitourinary: Negative for dysuria, frequency and urgency.  Musculoskeletal: Negative for joint pain and myalgias.  Skin: Negative for rash.  Neurological: Negative for dizziness, tingling,  tremors, weakness and headaches.      Objective  Vitals:   07/15/16 0952 07/15/16 1030  BP: (!) 150/76 (!) 155/80  Pulse: 63   Resp: 16   Temp: 98.4 F (36.9 C)   TempSrc: Oral   Weight: 180 lb (81.6 kg)   Height: 5' 4"  (1.626 m)     Physical Exam  Constitutional: She is oriented to person, place, and time and well-developed, well-nourished, and in no distress. No distress.  HENT:  Head: Normocephalic and atraumatic.  Eyes: Conjunctivae and EOM are normal. Pupils are equal, round, and reactive to light. No scleral icterus.  Neck: Normal range of motion. Neck supple. Carotid bruit is not present. No thyromegaly present.  Cardiovascular: Normal rate, regular rhythm and normal heart sounds.  Exam reveals no  gallop and no friction rub.   No murmur heard. Pulmonary/Chest: Effort normal and breath sounds normal. No respiratory distress. She has no wheezes. She has no rales.  Musculoskeletal: She exhibits no edema.  Lymphadenopathy:    She has no cervical adenopathy.  Neurological: She is alert and oriented to person, place, and time.  Vitals reviewed.      Recent Results (from the past 2160 hour(s))  COMPLETE METABOLIC PANEL WITH GFR     Status: None   Collection Time: 06/17/16 12:01 AM  Result Value Ref Range   Sodium 139 135 - 146 mmol/L   Potassium 4.3 3.5 - 5.3 mmol/L   Chloride 106 98 - 110 mmol/L   CO2 28 20 - 31 mmol/L   Glucose, Bld 89 65 - 99 mg/dL   BUN 24 7 - 25 mg/dL   Creat 0.84 0.60 - 0.93 mg/dL    Comment:   For patients > or = 72 years of age: The upper reference limit for Creatinine is approximately 13% higher for people identified as African-American.      Total Bilirubin 0.4 0.2 - 1.2 mg/dL   Alkaline Phosphatase 117 33 - 130 U/L   AST 26 10 - 35 U/L   ALT 20 6 - 29 U/L   Total Protein 7.7 6.1 - 8.1 g/dL   Albumin 3.7 3.6 - 5.1 g/dL   Calcium 9.5 8.6 - 10.4 mg/dL   GFR, Est African American 81 >=60 mL/min   GFR, Est Non African American 70 >=60 mL/min  CBC with Differential     Status: Abnormal   Collection Time: 06/17/16 12:01 AM  Result Value Ref Range   WBC 5.4 3.8 - 10.8 K/uL   RBC 4.25 3.80 - 5.10 MIL/uL   Hemoglobin 12.9 11.7 - 15.5 g/dL   HCT 38.8 35.0 - 45.0 %   MCV 91.3 80.0 - 100.0 fL   MCH 30.4 27.0 - 33.0 pg   MCHC 33.2 32.0 - 36.0 g/dL   RDW 13.3 11.0 - 15.0 %   Platelets 207 140 - 400 K/uL   MPV 12.6 (H) 7.5 - 12.5 fL   Neutro Abs 3,186 1,500 - 7,800 cells/uL   Lymphs Abs 1,512 850 - 3,900 cells/uL   Monocytes Absolute 270 200 - 950 cells/uL   Eosinophils Absolute 378 15 - 500 cells/uL   Basophils Absolute 54 0 - 200 cells/uL   Neutrophils Relative % 59 %   Lymphocytes Relative 28 %   Monocytes Relative 5 %   Eosinophils Relative  7 %   Basophils Relative 1 %   Smear Review Criteria for review not met   Lipid Profile     Status: Abnormal  Collection Time: 06/17/16 12:01 AM  Result Value Ref Range   Cholesterol 202 (H) <200 mg/dL   Triglycerides 82 <150 mg/dL   HDL 60 >50 mg/dL   Total CHOL/HDL Ratio 3.4 <5.0 Ratio   VLDL 16 <30 mg/dL   LDL Cholesterol 126 (H) <100 mg/dL     Assessment & Plan  Problem List Items Addressed This Visit      Cardiovascular and Mediastinum   Essential hypertension - Primary   Relevant Medications   amLODipine (NORVASC) 2.5 MG tablet      Meds ordered this encounter  Medications  . amLODipine (NORVASC) 2.5 MG tablet    Sig: Take 1 tablet (2.5 mg total) by mouth daily.    Dispense:  30 tablet    Refill:  6   1. Essential hypertension  - amLODipine (NORVASC) 2.5 MG tablet; Take 1 tablet (2.5 mg total) by mouth daily.  Dispense: 30 tablet; Refill: 6

## 2016-07-31 ENCOUNTER — Other Ambulatory Visit: Payer: Self-pay

## 2016-07-31 DIAGNOSIS — C20 Malignant neoplasm of rectum: Secondary | ICD-10-CM

## 2016-08-05 ENCOUNTER — Ambulatory Visit
Admission: RE | Admit: 2016-08-05 | Discharge: 2016-08-05 | Disposition: A | Discharge status: Home / Self Care | Payer: Medicare Other | Source: Ambulatory Visit | Attending: Family Medicine | Admitting: Family Medicine

## 2016-08-05 DIAGNOSIS — Z1231 Encounter for screening mammogram for malignant neoplasm of breast: Secondary | ICD-10-CM | POA: Insufficient documentation

## 2016-08-05 IMAGING — MG MM DIGITAL SCREENING BILAT W/ TOMO W/ CAD
8 of 13 series · 8 of 29 positions shown · non-contrast
Comparison: Previous exam(s).

CLINICAL DATA: Screening.

EXAM:
2D DIGITAL SCREENING BILATERAL MAMMOGRAM WITH CAD AND ADJUNCT TOMO

[R MLO (1 of 2)]
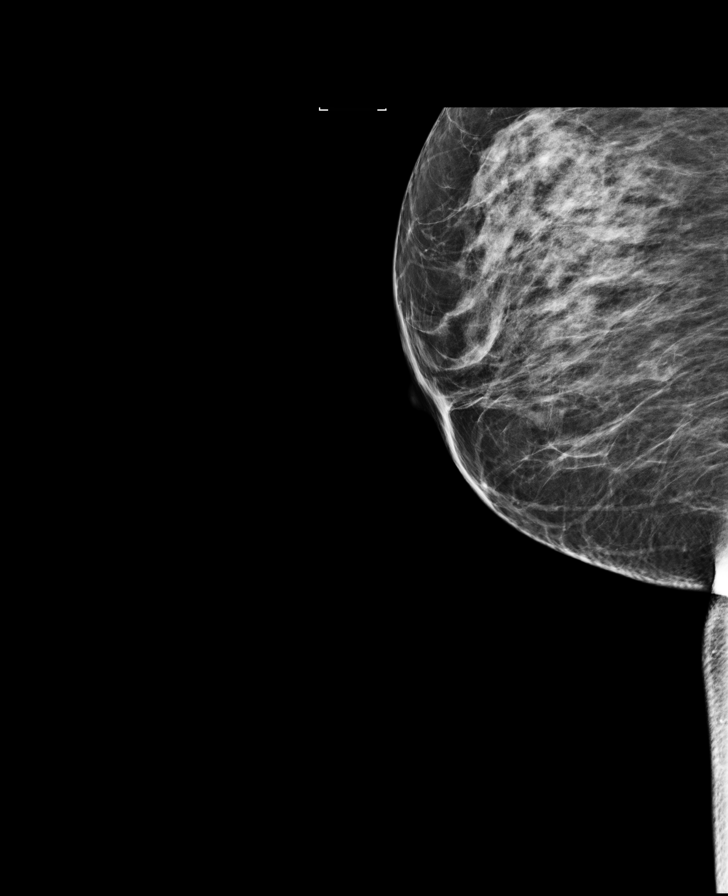

[R MLO (2 of 2)]
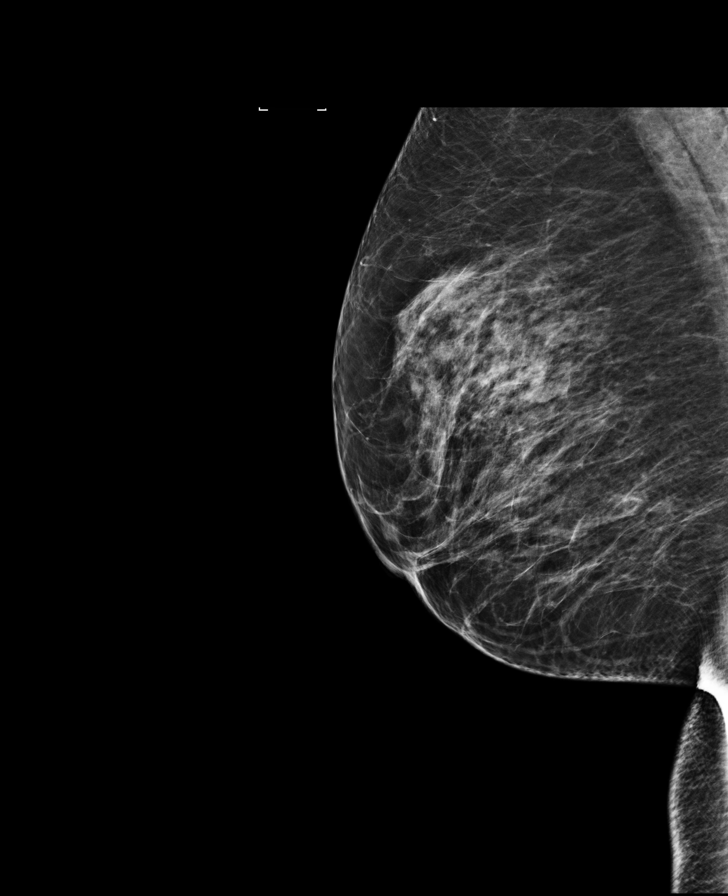

[L MLO]
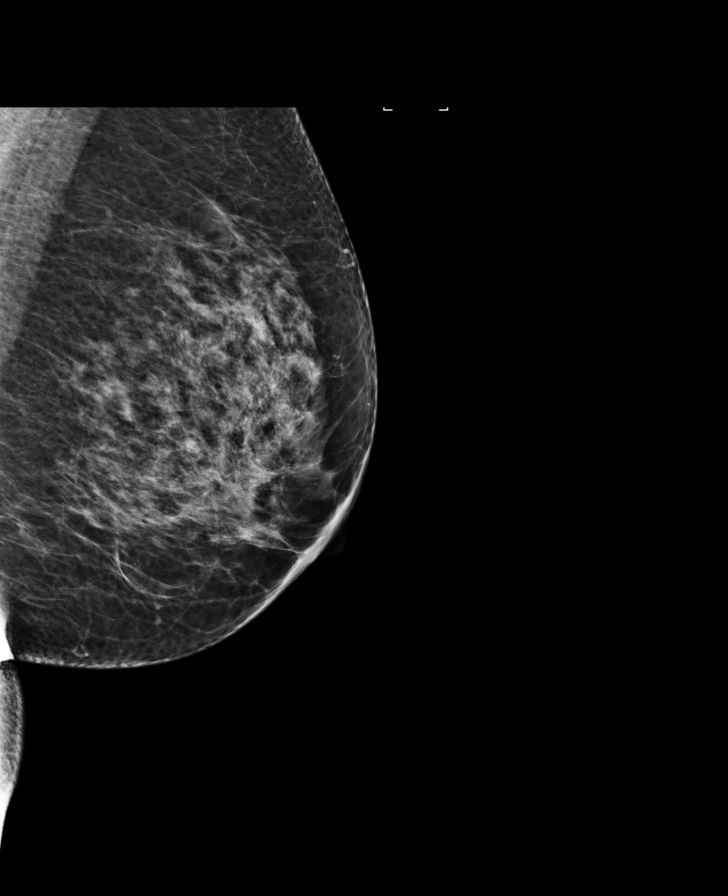

[L CC]
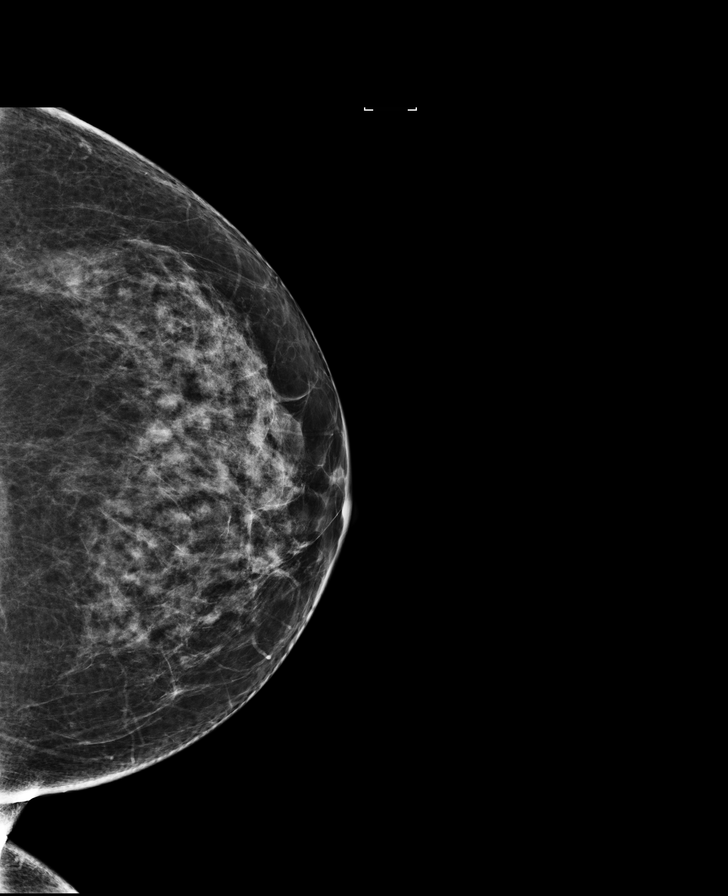

[R MLO synth-2D]
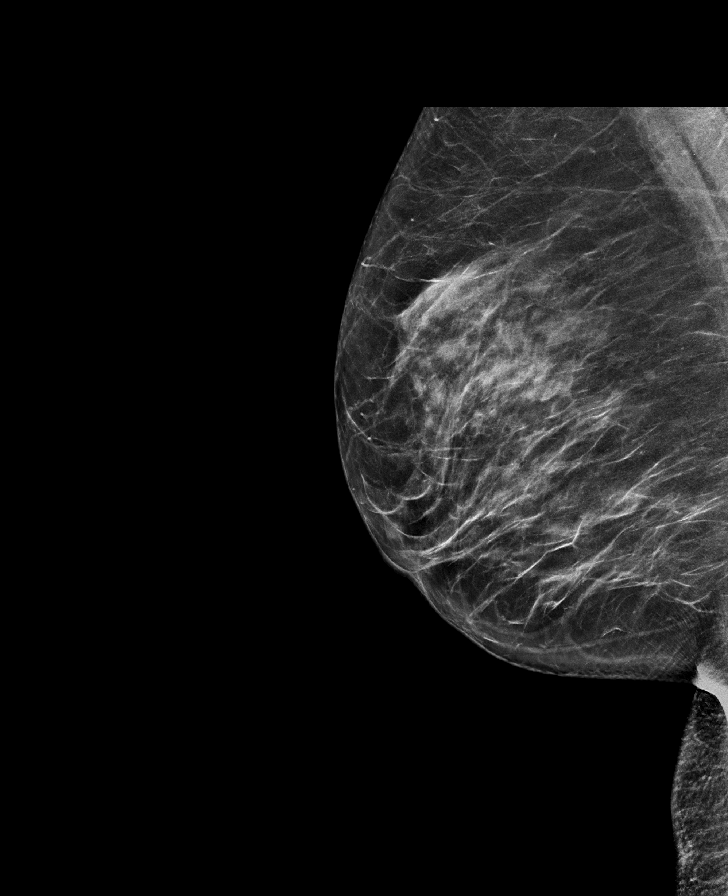

[L MLO synth-2D]
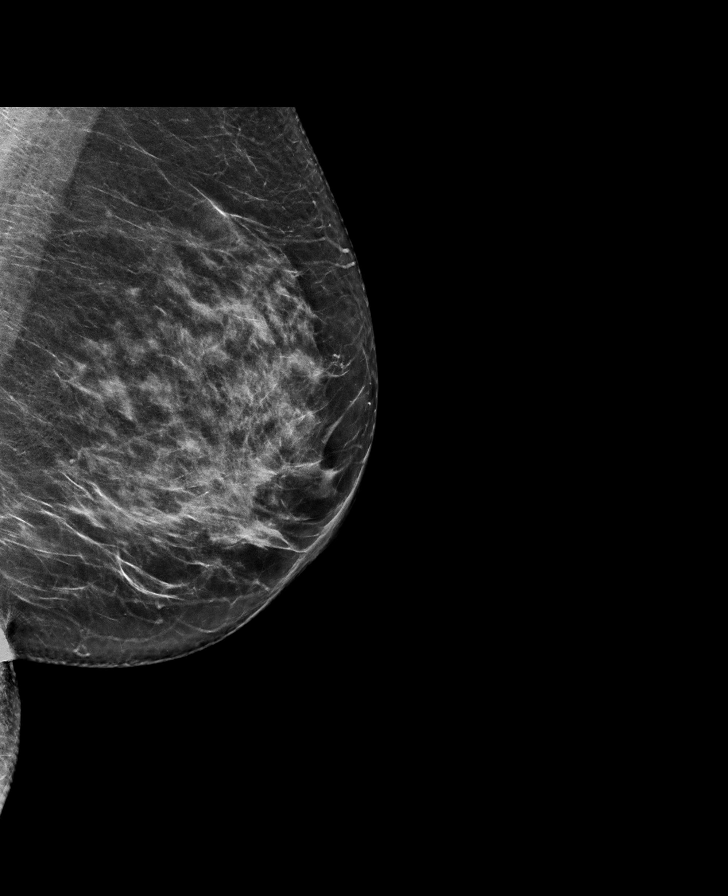

[R CC]
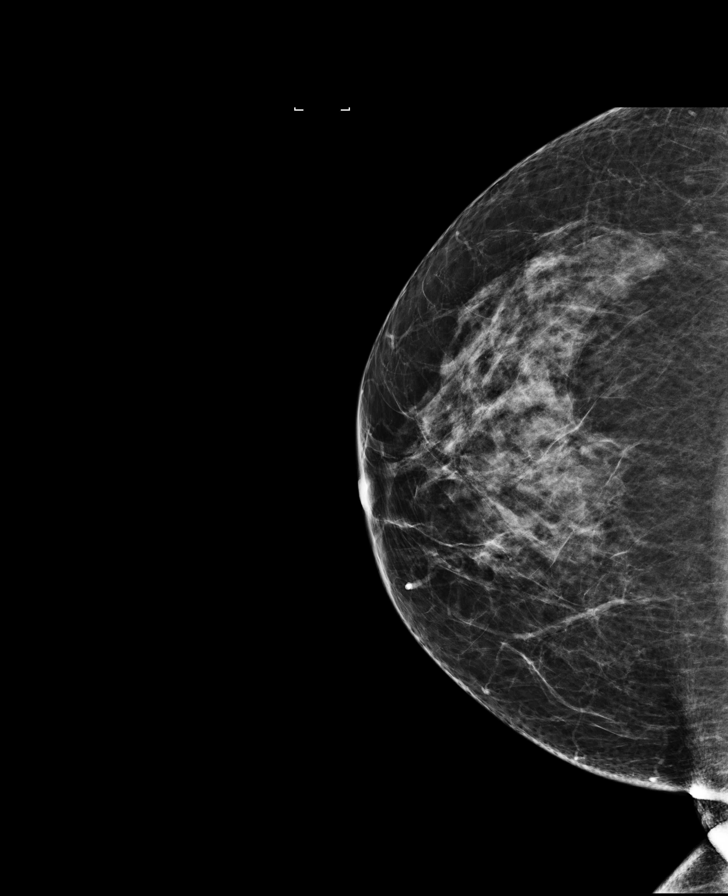

[L CC synth-2D]
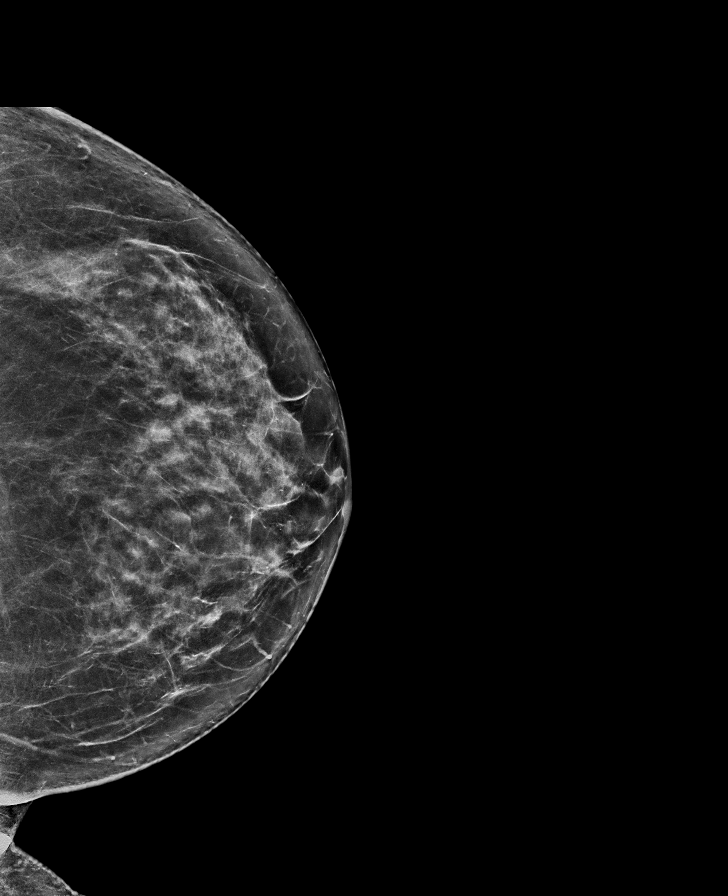

[8 of 29 positions shown; findings below may reference images not displayed]

ACR Breast Density Category c: The breast tissue is heterogeneously
dense, which may obscure small masses.
FINDINGS: There are no findings suspicious for malignancy. Images were
processed with CAD.
IMPRESSION: No mammographic evidence of malignancy. A result letter of this
screening mammogram will be mailed directly to the patient.

RECOMMENDATION:
Screening mammogram in one year. (Code:[TA])

BI-RADS CATEGORY  1: Negative.

## 2016-08-26 DIAGNOSIS — C20 Malignant neoplasm of rectum: Secondary | ICD-10-CM | POA: Diagnosis not present

## 2016-08-27 LAB — CEA: CEA: 1.7 ng/mL (ref 0.0–4.7)

## 2016-09-04 ENCOUNTER — Telehealth: Payer: Self-pay | Admitting: *Deleted

## 2016-09-04 NOTE — Telephone Encounter (Signed)
Notified patient as instructed, patient verbalized understanding. Patient agrees with follow up appointments. Placed in recalls.

## 2016-09-04 NOTE — Telephone Encounter (Signed)
Patient called wanted to know her results from CEA done on 08/26/16. Thank you

## 2016-09-04 NOTE — Telephone Encounter (Signed)
CEA trending down. Repeat prior to October f/u appt. Thanks.

## 2016-09-16 ENCOUNTER — Ambulatory Visit (INDEPENDENT_AMBULATORY_CARE_PROVIDER_SITE_OTHER): Payer: Medicare Other | Admitting: Family Medicine

## 2016-09-16 ENCOUNTER — Encounter: Payer: Self-pay | Admitting: Family Medicine

## 2016-09-16 VITALS — BP 132/71 | HR 63 | Temp 97.8°F | Resp 16 | Ht 64.0 in | Wt 178.0 lb

## 2016-09-16 DIAGNOSIS — J309 Allergic rhinitis, unspecified: Secondary | ICD-10-CM | POA: Insufficient documentation

## 2016-09-16 DIAGNOSIS — I10 Essential (primary) hypertension: Secondary | ICD-10-CM

## 2016-09-16 DIAGNOSIS — H6981 Other specified disorders of Eustachian tube, right ear: Secondary | ICD-10-CM

## 2016-09-16 DIAGNOSIS — J3089 Other allergic rhinitis: Secondary | ICD-10-CM | POA: Diagnosis not present

## 2016-09-16 MED ORDER — LORATADINE 10 MG PO TABS: 10.0000 mg | ORAL_TABLET | Freq: Every day | ORAL | 11 refills | Status: DC

## 2016-09-16 MED ORDER — FLUTICASONE PROPIONATE 50 MCG/ACT NA SUSP: 2.0000 | Freq: Every day | NASAL | 3 refills | Status: DC

## 2016-09-16 MED ORDER — LISINOPRIL 40 MG PO TABS: 40.0000 mg | ORAL_TABLET | Freq: Every day | ORAL | 3 refills | Status: DC

## 2016-09-16 MED ORDER — AMLODIPINE BESYLATE 2.5 MG PO TABS: 2.5000 mg | ORAL_TABLET | Freq: Every day | ORAL | 3 refills | Status: DC

## 2016-09-16 NOTE — Progress Notes (Signed)
Subjective:    Patient ID: Vanessa Reynolds, female    DOB: 02-Jan-1946, 71 y.o.   MRN: [id number]  Vanessa Reynolds is a 71 y.o. female presenting on 09/16/2016 for Hypertension   HPI   CHRONIC HTN: Reports last visit 2 months ago, she was reduced on Amlodipine from 5mg  down to 2.5mg  daily due to some generalized side effects after about 1 month, had some weakness and felt "different" did not have swelling, now doing better on lower dose Amlodipine. Also admits she was on HCTZ at last visit she had stopped this as well. - Prior history dx HTN >5 years ago, on medication - Checks BP at home regularly, readings have been in normal 120s/70-80s Current Meds - Lisinopril 40mg  daily, Amlodipine 2.5mg  nightly   Reports good compliance, took meds today. Tolerating well, w/o complaints. Lifestyle - Working as Probation officer. Trying to improve diet, still eats some regular ice cream, does eat smaller portions - Stays active at work but no regular exercise outside of work - Admits half to whole cup coffee in AM, otherwise mostly water, no teas or soda - No significant changes in life with stressors, copes well with daily stress Denies CP, dyspnea, HA, edema, dizziness / lightheadedness  R Eustachian Tube Dysfunction / Environmental Allergies / Rhinosinusitis: - Reports recently has had worsening problem with R ear pressure and muffled sound at times, did not have significant sinus congestion but has had some mild post nasal drainage and allergy symptoms. - Has been on anti-histamine in past, no medicines currently - Denies sinus pain, fevers, chills, purulence   Social History  Substance Use Topics  . Smoking status: Former Smoker    Packs/day: 1.00    Years: 30.00    Types: Cigarettes  . Smokeless tobacco: Never Used     Comment: quit in 1999  . Alcohol use No    Review of Systems Per HPI unless specifically indicated above     Objective:    BP 132/71   Pulse 63   Temp 97.8 F (36.6  C) (Oral)   Resp 16   Ht 5\' 4"  (1.626 m)   Wt 178 lb (80.7 kg)   BMI 30.55 kg/m   Wt Readings from Last 3 Encounters:  09/16/16 178 lb (80.7 kg)  07/15/16 180 lb (81.6 kg)  06/03/16 180 lb (81.6 kg)    Physical Exam  Constitutional: She is oriented to person, place, and time. She appears well-developed and well-nourished. No distress.  Well-appearing, comfortable, cooperative  HENT:  Head: Normocephalic and atraumatic.  Mouth/Throat: Oropharynx is clear and moist.  Frontal / maxillary sinuses non-tender. Nares with some deeper turbinate edema without congestion or purulence.   R ear canal without cerumen. R TM with moderate amount mildly cloudy effusion with some fullness without erythema or bulging. L canal with mild cerumen not impacted. L TM mostly clear without significant effusion.  Oropharynx clear without erythema, exudates, edema or asymmetry.  Eyes: Conjunctivae are normal. Right eye exhibits no discharge. Left eye exhibits no discharge.  Neck: Normal range of motion. Neck supple.  Cardiovascular: Normal rate, regular rhythm, normal heart sounds and intact distal pulses.   No murmur heard. Pulmonary/Chest: Effort normal and breath sounds normal. No respiratory distress. She has no wheezes. She has no rales.  Musculoskeletal: She exhibits no edema.  Neurological: She is alert and oriented to person, place, and time.  Skin: Skin is warm and dry. No rash noted. She is not diaphoretic. No erythema.  Psychiatric: She has a normal mood and affect. Her behavior is normal.  Nursing note and vitals reviewed.  Results for orders placed or performed in visit on 07/31/16  CEA  Result Value Ref Range   CEA 1.7 0.0 - 4.7 ng/mL      Assessment & Plan:   Problem List Items Addressed This Visit    Essential hypertension - Primary    Controlled HTN, improved on re-check No complications Failed Amlodipine 5mg  intolerance, off HCTZ   Plan:  1. Continue current BP meds -  Amlodipine 2.5, Lisinopril 40mg  daily 2. Due for chemistry 05/2017 annual physical 3. Lifestyle Mods - improve regular exercise, low sodium diet 4. Monitor BP at home, hold arm heart level with wrist cuff, notify office if >140/90 persistently 5. Follow-up 3 months HTN       Relevant Medications   amLODipine (NORVASC) 2.5 MG tablet   lisinopril (PRINIVIL,ZESTRIL) 40 MG tablet   Environmental and seasonal allergies    Likely contributing to current rhinosinusitis, R eustachian tube dysfunction Start loratadine sent rx, and Flonase 2 sprays each nare daily for 4-6 weeks likely longer      Relevant Medications   loratadine (CLARITIN) 10 MG tablet   fluticasone (FLONASE) 50 MCG/ACT nasal spray   Allergic rhinitis due to allergen    Likely etiology for R eustachian tube dsyfunction No evidence of acute sinusitis Treat loratadine and Flonase      Relevant Medications   loratadine (CLARITIN) 10 MG tablet   fluticasone (FLONASE) 50 MCG/ACT nasal spray    Other Visit Diagnoses    Dysfunction of right eustachian tube       Likely secondary to allergies / rhinosinusitis. No evidence of infection or other complicatin, concern with slightly muffled hearing. No cerumen impaction. F/u   Relevant Medications   loratadine (CLARITIN) 10 MG tablet   fluticasone (FLONASE) 50 MCG/ACT nasal spray      Meds ordered this encounter  Medications  . amLODipine (NORVASC) 2.5 MG tablet    Sig: Take 1 tablet (2.5 mg total) by mouth daily.    Dispense:  90 tablet    Refill:  3  . lisinopril (PRINIVIL,ZESTRIL) 40 MG tablet    Sig: Take 1 tablet (40 mg total) by mouth daily.    Dispense:  90 tablet    Refill:  3    Renew rx to keep refills at pharmacy, may fill if needed  . loratadine (CLARITIN) 10 MG tablet    Sig: Take 1 tablet (10 mg total) by mouth daily. Use for 4-6 weeks then stop, and use as needed or seasonally    Dispense:  30 tablet    Refill:  11  . fluticasone (FLONASE) 50 MCG/ACT nasal  spray    Sig: Place 2 sprays into both nostrils daily. Use for 4-6 weeks then stop and use seasonally or as needed.    Dispense:  16 g    Refill:  3      Follow up plan: Return in about 3 months (around 12/16/2016) for blood pressure.  Vanessa Reynolds, [facility] 09/16/2016, 8:24 PM

## 2016-09-16 NOTE — Patient Instructions (Signed)
Thank you for coming to the clinic today.  1. Keep up the good work with Blood Pressure - Continue checking at home, raise arm to chest level, if elevated BP >140/90 let our office know - CHeck food labels for sodium, try to eat low salt diet - In future if needed, can try to work on more regular walking or exercise  2. It sounds like you have persistent Allergies and "Rhinosinusitis" - I do not think that this is a Bacterial Sinus Infection - No antibiotics are needed - Start OTC Loratadine (Claritin) 10mg  daily and Flonase 2 sprays in each nostril daily for next 4-6 weeks, then you may stop and use seasonally or as needed - Recommend to keep using Nasal Saline spray multiple times a day to help flush out congestion and clear sinuses - Improve hydration by drinking plenty of clear fluids (water, gatorade) to reduce secretions and thin congestion  You do have Right Ear Eustachian tube dysfunction and some fluid behind ear drums.  If you develop persistent fever >101F for at least 3 consecutive days, headaches with sinus pain or pressure or persistent earache, please schedule a follow-up evaluation within next few days to week.  Please schedule a follow-up appointment with Dr. Parks Ranger in 3 months for blood pressure, allergies  If need me sooner for Ear with difficulties hearing, we can refer you to ENT.  If you have any other questions or concerns, please feel free to call the clinic or send a message through [facility]. You may also schedule an earlier appointment if necessary.  Nobie Putnam, DO [place]

## 2016-09-16 NOTE — Assessment & Plan Note (Signed)
Likely etiology for R eustachian tube dsyfunction No evidence of acute sinusitis Treat loratadine and Flonase

## 2016-09-16 NOTE — Assessment & Plan Note (Signed)
Controlled HTN, improved on re-check No complications Failed Amlodipine 5mg  intolerance, off HCTZ   Plan:  1. Continue current BP meds - Amlodipine 2.5, Lisinopril 40mg  daily 2. Due for chemistry 05/2017 annual physical 3. Lifestyle Mods - improve regular exercise, low sodium diet 4. Monitor BP at home, hold arm heart level with wrist cuff, notify office if >140/90 persistently 5. Follow-up 3 months HTN

## 2016-09-16 NOTE — Assessment & Plan Note (Signed)
Likely contributing to current rhinosinusitis, R eustachian tube dysfunction Start loratadine sent rx, and Flonase 2 sprays each nare daily for 4-6 weeks likely longer

## 2016-10-22 ENCOUNTER — Telehealth: Payer: Self-pay | Admitting: Family Medicine

## 2016-10-22 NOTE — Telephone Encounter (Signed)
Called pt to schedule Annual Wellness Visit with Nurse Health Advisor for July:  - knb

## 2016-10-24 ENCOUNTER — Ambulatory Visit (INDEPENDENT_AMBULATORY_CARE_PROVIDER_SITE_OTHER): Payer: Medicare Other | Admitting: Nurse Practitioner

## 2016-10-24 ENCOUNTER — Encounter: Payer: Self-pay | Admitting: Nurse Practitioner

## 2016-10-24 VITALS — BP 139/67 | HR 64 | Temp 97.8°F | Ht 64.0 in | Wt 179.2 lb

## 2016-10-24 DIAGNOSIS — J00 Acute nasopharyngitis [common cold]: Secondary | ICD-10-CM | POA: Diagnosis not present

## 2016-10-24 MED ORDER — BENZONATATE 100 MG PO CAPS: 100.0000 mg | ORAL_CAPSULE | Freq: Two times a day (BID) | ORAL | 0 refills | Status: DC | PRN

## 2016-10-24 NOTE — Progress Notes (Signed)
Subjective:    Patient ID: Vanessa Reynolds, female    DOB: 02-09-1946, 71 y.o.   MRN: [id number]  Vanessa Reynolds is a 71 y.o. female presenting on 10/24/2016 for Cough (runny nose, productive cough, x 5days )   HPI URI symptoms Started having symptoms on Saturday (5 days ago).  Symptoms include runny nose, coughing, sinus congestion and pressure, no ear pressure or fullness.  Denies fever, chills, sweats, epistaxis, sore throat, nausea, vomiting.  Is still taking loratadine and Flonase daily.  Has not taken any mucinex or mucinex DM or other OTC medications.  Social History  Substance Use Topics  . Smoking status: Former Smoker    Packs/day: 1.00    Years: 30.00    Types: Cigarettes  . Smokeless tobacco: Never Used     Comment: quit in 1999  . Alcohol use No    Review of Systems Per HPI unless specifically indicated above     Objective:    BP 139/67 (BP Location: Right Arm, Patient Position: Sitting, Cuff Size: Normal)   Pulse 64   Temp 97.8 F (36.6 C) (Oral)   Ht 5\' 4"  (1.626 m)   Wt 179 lb 3.2 oz (81.3 kg)   SpO2 100%   BMI 30.76 kg/m   Wt Readings from Last 3 Encounters:  10/24/16 179 lb 3.2 oz (81.3 kg)  09/16/16 178 lb (80.7 kg)  07/15/16 180 lb (81.6 kg)    Physical Exam  Constitutional: She is oriented to person, place, and time. She appears well-developed and well-nourished. No distress.  HENT:  Head: Normocephalic and atraumatic.  Right Ear: Tympanic membrane, external ear and ear canal normal.  Left Ear: Tympanic membrane, external ear and ear canal normal.  Nose: Rhinorrhea present. No mucosal edema or sinus tenderness. Right sinus exhibits no maxillary sinus tenderness and no frontal sinus tenderness. Left sinus exhibits no maxillary sinus tenderness and no frontal sinus tenderness.  Mouth/Throat: Uvula is midline, oropharynx is clear and moist and mucous membranes are normal.  Eyes: Conjunctivae are normal. Pupils are equal, round, and reactive to  light.  Neck: Normal range of motion. Neck supple. No JVD present. No tracheal deviation present.  Cardiovascular: Normal rate, regular rhythm, normal heart sounds and intact distal pulses.   Pulmonary/Chest: Effort normal and breath sounds normal. No respiratory distress.  Lymphadenopathy:    She has no cervical adenopathy.  Neurological: She is alert and oriented to person, place, and time.  Skin: Skin is warm and dry. She is not diaphoretic.  Psychiatric: She has a normal mood and affect. Her behavior is normal.  Vitals reviewed.     Assessment & Plan:   Problem List Items Addressed This Visit    None    Visit Diagnoses    Acute nasopharyngitis (common cold)    -  Primary Acute illness. Fever responsive to NSAIDs and tylenol.  Symptoms not worsening. Consistent with viral illness x 4 days with no known sick contacts and no identifiable focal infections of ears, nose, throat.  Plan: 1. Reassurance, likely self-limited with cough lasting up to few weeks -  Continue anti-histamine Loratadine 10mg  daily - can use Flonase 2 sprays each nostril daily for up to 4-6 weeks - Start Mucinex-DM OTC up to 7-10 days then stop - Start tessalon perles 100 mg every 12 hours as needed for cough. 2. Supportive care with nasal saline, warm herbal tea with honey, 3. Improve hydration 4. Tylenol / Motrin PRN fevers 5. Return criteria given.  Consider antibiotic if persists through Monday.       Meds ordered this encounter  Medications  . benzonatate (TESSALON) 100 MG capsule    Sig: Take 1 capsule (100 mg total) by mouth 2 (two) times daily as needed for cough.    Dispense:  20 capsule    Refill:  0    Order Specific Question:   Supervising Provider    Answer:   Olin Hauser [[id number]]      Follow up plan: Return 3-5 days if symptoms worsen or fail to improve.   Cassell Smiles, DNP, AGPCNP-BC Adult Gerontology Primary Care Nurse Practitioner [facility] 10/24/2016, 6:06 PM

## 2016-10-24 NOTE — Patient Instructions (Addendum)
Vanessa Reynolds, Thank you for coming in to clinic today.  1. It sounds like you have a Upper Respiratory Virus - this will most likely run it's course in 7 to 10 days. Recommend good hand washing. - Continue anti-histamine loratadine 10mg  daily. - Continue Flonase 2 sprays each nostril daily for up to 4-6 weeks - For congestion, start OTC Mucinex-DM up to 7-10 days then stop  Active ingredient: guaifenesin (mucous) and dextromethorphan (cough) - Start benzonatate 100 mg twice daily for cough. - Drink plenty of fluids to improve congestion - You may try over the counter Nasal Saline spray (Simply Saline, Ocean Spray) as needed to reduce congestion. - Drink warm herbal tea with honey for sore throat. - Start taking Tylenol extra strength 1 to 2 tablets every 6-8 hours for aches or fever/chills for next few days as needed.  Do not take more than 3,000 mg in 24 hours from all medicines.    If symptoms significantly worsening with persistent fevers/chills despite tylenol/ibpurofen, nausea, vomiting unable to tolerate food/fluids or medicine, body aches, or shortness of breath, sinus pain pressure or worsening productive cough, then follow-up for re-evaluation, may seek more immediate care at Urgent Care or ED if more concerned for emergency.   Please schedule a follow-up appointment with Cassell Smiles, AGNP to Return 3-5 days if symptoms worsen or fail to improve.  If you have any other questions or concerns, please feel free to call the clinic or send a message through [place]. You may also schedule an earlier appointment if necessary.  Cassell Smiles, DNP, AGNP-BC Adult Gerontology Nurse Practitioner [facility], [facility]    Upper Respiratory Infection, Adult Most upper respiratory infections (URIs) are a viral infection of the air passages leading to the lungs. A URI affects the nose, throat, and upper air passages. The most common type of URI is nasopharyngitis and is typically  referred to as "the common cold." URIs run their course and usually go away on their own. Most of the time, a URI does not require medical attention, but sometimes a bacterial infection in the upper airways can follow a viral infection. This is called a secondary infection. Sinus and middle ear infections are common types of secondary upper respiratory infections. Bacterial pneumonia can also complicate a URI. A URI can worsen asthma and chronic obstructive pulmonary disease (COPD). Sometimes, these complications can require emergency medical care and may be life threatening. What are the causes? Almost all URIs are caused by viruses. A virus is a type of germ and can spread from one person to another. What increases the risk? You may be at risk for a URI if:  You smoke.  You have chronic heart or lung disease.  You have a weakened defense (immune) system.  You are very young or very old.  You have nasal allergies or asthma.  You work in crowded or poorly ventilated areas.  You work in health care facilities or schools.  What are the signs or symptoms? Symptoms typically develop 2-3 days after you come in contact with a cold virus. Most viral URIs last 7-10 days. However, viral URIs from the influenza virus (flu virus) can last 14-18 days and are typically more severe. Symptoms may include:  Runny or stuffy (congested) nose.  Sneezing.  Cough.  Sore throat.  Headache.  Fatigue.  Fever.  Loss of appetite.  Pain in your forehead, behind your eyes, and over your cheekbones (sinus pain).  Muscle aches.  How is this diagnosed?  Your health care provider may diagnose a URI by:  Physical exam.  Tests to check that your symptoms are not due to another condition such as: ? Strep throat. ? Sinusitis. ? Pneumonia. ? Asthma.  How is this treated? A URI goes away on its own with time. It cannot be cured with medicines, but medicines may be prescribed or recommended to  relieve symptoms. Medicines may help:  Reduce your fever.  Reduce your cough.  Relieve nasal congestion.  Follow these instructions at home:  Take medicines only as directed by your health care provider.  Gargle warm saltwater or take cough drops to comfort your throat as directed by your health care provider.  Use a warm mist humidifier or inhale steam from a shower to increase air moisture. This may make it easier to breathe.  Drink enough fluid to keep your urine clear or pale yellow.  Eat soups and other clear broths and maintain good nutrition.  Rest as needed.  Return to work when your temperature has returned to normal or as your health care provider advises. You may need to stay home longer to avoid infecting others. You can also use a face mask and careful hand washing to prevent spread of the virus.  Increase the usage of your inhaler if you have asthma.  Do not use any tobacco products, including cigarettes, chewing tobacco, or electronic cigarettes. If you need help quitting, ask your health care provider. How is this prevented? The best way to protect yourself from getting a cold is to practice good hygiene.  Avoid oral or hand contact with people with cold symptoms.  Wash your hands often if contact occurs.  There is no clear evidence that vitamin C, vitamin E, echinacea, or exercise reduces the chance of developing a cold. However, it is always recommended to get plenty of rest, exercise, and practice good nutrition. Contact a health care provider if:  You are getting worse rather than better.  Your symptoms are not controlled by medicine.  You have chills.  You have worsening shortness of breath.  You have brown or red mucus.  You have yellow or brown nasal discharge.  You have pain in your face, especially when you bend forward.  You have a fever.  You have swollen neck glands.  You have pain while swallowing.  You have white areas in the back  of your throat. Get help right away if:  You have severe or persistent: ? Headache. ? Ear pain. ? Sinus pain. ? Chest pain.  You have chronic lung disease and any of the following: ? Wheezing. ? Prolonged cough. ? Coughing up blood. ? A change in your usual mucus.  You have a stiff neck.  You have changes in your: ? Vision. ? Hearing. ? Thinking. ? Mood. This information is not intended to replace advice given to you by your health care provider. Make sure you discuss any questions you have with your health care provider. Document Released: 10/30/2000 Document Revised: 01/07/2016 Document Reviewed: 08/11/2013 Elsevier Interactive Patient Education  2017 [employer].

## 2016-10-25 NOTE — Progress Notes (Signed)
I have reviewed this encounter including the documentation in this note and/or discussed this patient with the provider, Cassell Smiles, AGPCNP-BC. I am certifying that I agree with the content of this note as supervising physician.  Nobie Putnam, [facility] 10/25/2016, 8:41 AM

## 2016-12-16 ENCOUNTER — Ambulatory Visit (INDEPENDENT_AMBULATORY_CARE_PROVIDER_SITE_OTHER): Payer: Medicare Other | Admitting: Family Medicine

## 2016-12-16 ENCOUNTER — Encounter: Payer: Self-pay | Admitting: Family Medicine

## 2016-12-16 ENCOUNTER — Other Ambulatory Visit: Payer: Self-pay | Admitting: Family Medicine

## 2016-12-16 VITALS — BP 130/66 | HR 64 | Temp 97.9°F | Resp 16 | Ht 64.0 in | Wt 179.0 lb

## 2016-12-16 DIAGNOSIS — J3089 Other allergic rhinitis: Secondary | ICD-10-CM | POA: Diagnosis not present

## 2016-12-16 DIAGNOSIS — I10 Essential (primary) hypertension: Secondary | ICD-10-CM | POA: Diagnosis not present

## 2016-12-16 DIAGNOSIS — H6991 Unspecified Eustachian tube disorder, right ear: Secondary | ICD-10-CM

## 2016-12-16 DIAGNOSIS — R7309 Other abnormal glucose: Secondary | ICD-10-CM

## 2016-12-16 DIAGNOSIS — H6981 Other specified disorders of Eustachian tube, right ear: Secondary | ICD-10-CM | POA: Diagnosis not present

## 2016-12-16 DIAGNOSIS — E782 Mixed hyperlipidemia: Secondary | ICD-10-CM

## 2016-12-16 DIAGNOSIS — L821 Other seborrheic keratosis: Secondary | ICD-10-CM

## 2016-12-16 DIAGNOSIS — C187 Malignant neoplasm of sigmoid colon: Secondary | ICD-10-CM

## 2016-12-16 NOTE — Patient Instructions (Addendum)
Thank you for coming to the clinic today.  1. Keep up the good work. BP is good. No changes to medicines. You should have have plenty of refills on all the meds at [place]. Let me know if needed  I think spot on bridge of nose is benign, likely a Seborrheic Keratosis. Potentially it could be a pre-cancerous spot, that may need to be frozen off by Dermatology  Sleep Hygiene Recommendations to promote healthy sleep in all patients, especially if symptoms of insomnia are worsening. Due to the nature of sleep rhythms, if your body gets "out of rhythm", it may take some time before your sleep cycle can be "reset".  Please try to follow as many of the following tips as you can, usually there are only a few of these are the primary cause of the problem.  ?To reset your sleep rhythm, go to bed and get up at the same time every day ?Sleep only long enough to feel rested and then get out of bed ?Do not try to force yourself to sleep. If you can't sleep, get out of bed and try again later. ?Avoid naps during the day, unless excessively tired. The more sleeping during the day, then the less sleep your body needs at night.  ?Have coffee, tea, and other foods that have caffeine only in the morning ?Exercise several days a week, but not right before bed ?If you drink alcohol, prefer to have appropriate drink with one meal, but prefer to avoid alcohol in the evening, and bedtime ?If you smoke, avoid smoking, especially in the evening  ?Avoid watching TV or looking at phones, computers, or reading devices ("e-books") that give off light at least 30 minutes before bed. This artificial light sends "awake signals" to your brain and can make it harder to fall asleep. ?Make your bedroom a comfortable place where it is easy to fall asleep: ? Put up shades or special blackout curtains to block light from outside. ? Use a white noise machine to block noise. ? Keep the temperature cool. ?Try your best to solve or at  least address your problems before you go to bed ?Use relaxation techniques to manage stress. Ask your health care provider to suggest some techniques that may work well for you. These may include: ? Breathing exercises. ? Routines to release muscle tension. ? Visualizing peaceful scenes.  Also if interested may try OTC Melatonin.  DUE for FASTING BLOOD WORK (no food or drink after midnight before the lab appointment, only water or coffee without cream/sugar on the morning of)  SCHEDULE "Lab Only" visit in the morning at the clinic for lab draw in 6 MONTHS   - Make sure Lab Only appointment is at about 1 week before your next appointment, so that results will be available  For Lab Results, once available within 2-3 days of blood draw, you can can log in to MyChart online to view your results and a brief explanation. Also, we can discuss results at next follow-up visit.   Please schedule a Follow-up Appointment to: Return in about 6 months (around 06/18/2017) for Medicare Physical CPE.  If you have any other questions or concerns, please feel free to call the clinic or send a message through [place]. You may also schedule an earlier appointment if necessary.  Additionally, you may be receiving a survey about your experience at our clinic within a few days to 1 week by e-mail or mail. We value your feedback.  Nobie Putnam, DO  [place]

## 2016-12-16 NOTE — Assessment & Plan Note (Signed)
Well controlled HTN No complications Failed Amlodipine 5mg  intolerance, off HCTZ   Plan:  1. Continue current BP meds - Amlodipine 2.5, Lisinopril 40mg  daily 2. Next labs due 05/2017 3. Lifestyle Mods - continue try to improve regular exercise, low sodium diet 4. Contiue Monitor BP at home 5. Follow-up 6 months for yearly medicare physical + labs

## 2016-12-16 NOTE — Progress Notes (Signed)
Subjective:    Patient ID: Vanessa Reynolds, female    DOB: [birth year], 71 y.o.   MRN: [id number]  Vanessa Reynolds is a 71 y.o. female presenting on 12/16/2016 for Hypertension   HPI  CHRONIC HTN: - Last visit with me 09/16/16, for same problem HTN, reviewed prior course with reduced Amlodipine from 5 to 2.5 and self discontinued HCTZ, no other changes made that time by me, see prior notes for background information. - Interval update with checking BP at home, normal readings since last visit 120s/80s, checking less often now - Today patient reports no new concerns since last visit, current meds working well Current Meds - Lisinopril 40mg  daily, Amlodipine 2.5mg  nightly   Reports good compliance, took meds today. Tolerating well, w/o complaints. Lifestyle - Diet: Reduced amount of ice cream she is eating, also eating slower now, mostly water, no tea or soda. 1 cup coffe daily AM - Exercise: Stays active working as Probation officer, but no regular exercise still - Admits half to whole cup coffee in AM, otherwise mostly water, no teas or soda Denies CP, dyspnea, HA, edema, dizziness / lightheadedness  FOLLOW-UP R Eustachian Tube Dysfunction / Environmental Allergies / Rhinosinusitis: - Last visit with me 09/16/16, for initial visit for same problem, treated with Loratadine 10mg  and Flonase regularly, see prior notes for background information. - Interval update with mild improvement, still using medicines since last visit - Today patient reports no new concerns, wants to re-check her R ear. Hearing is a little better. Feels less sinus congestion - History of allergies - Denies sinus pain, fevers, chills, purulence  Abnormal Skin Spot - Reports additional complaint today of wanting to check out spot on nose, thought may be "white head", for few months, no bleeding, no itching, no prior skin cancer - some history of high sun exposure long time ago, now limited sun exposure  Social History    Substance Use Topics  . Smoking status: Former Smoker    Packs/day: 1.00    Years: 30.00    Types: Cigarettes  . Smokeless tobacco: Former Systems developer     Comment: quit in 1999  . Alcohol use No    Review of Systems Per HPI unless specifically indicated above     Objective:    BP 130/66   Pulse 64   Temp 97.9 F (36.6 C) (Oral)   Resp 16   Ht 5\' 4"  (1.626 m)   Wt 179 lb (81.2 kg)   BMI 30.73 kg/m   Wt Readings from Last 3 Encounters:  12/16/16 179 lb (81.2 kg)  10/24/16 179 lb 3.2 oz (81.3 kg)  09/16/16 178 lb (80.7 kg)    Physical Exam  Constitutional: She is oriented to person, place, and time. She appears well-developed and well-nourished. No distress.  Well-appearing, comfortable, cooperative  HENT:  Head: Normocephalic and atraumatic.  Mouth/Throat: Oropharynx is clear and moist.  Frontal / maxillary sinuses non-tender. Nares patent without purulence or edema. Bilateral TMs clear without erythema, effusion or bulging - resolved R TM clear effusion. Oropharynx clear without erythema, exudates, edema or asymmetry.  Eyes: Conjunctivae are normal. Right eye exhibits no discharge. Left eye exhibits no discharge.  Neck: Normal range of motion. Neck supple.  Cardiovascular: Normal rate, regular rhythm, normal heart sounds and intact distal pulses.   No murmur heard. Pulmonary/Chest: Effort normal and breath sounds normal. No respiratory distress. She has no wheezes. She has no rales.  Musculoskeletal: Normal range of motion. She exhibits no  edema.  Neurological: She is alert and oriented to person, place, and time.  Skin: Skin is warm and dry. No rash noted. She is not diaphoretic. No erythema.  Small 1 mm raised rough skin lesion not pigmented on mid bridge of nose, no ulceration. Possible SK vs AK  Psychiatric: She has a normal mood and affect. Her behavior is normal.  Well groomed, good eye contact, normal speech and thoughts  Nursing note and vitals reviewed.       Assessment & Plan:   Problem List Items Addressed This Visit    Essential hypertension - Primary    Well controlled HTN No complications Failed Amlodipine 5mg  intolerance, off HCTZ   Plan:  1. Continue current BP meds - Amlodipine 2.5, Lisinopril 40mg  daily 2. Next labs due 05/2017 3. Lifestyle Mods - continue try to improve regular exercise, low sodium diet 4. Contiue Monitor BP at home 5. Follow-up 6 months for yearly medicare physical + labs      Allergic rhinitis due to allergen    Improved R eustachian tube dysfunction and allergies Continue Loratadine Flonase       Other Visit Diagnoses    Seborrheic keratosis       Benign appearing lesion on nose, rough texture, AK vs SK, monitor for now, consider refer Derm maybe need Cryo   Dysfunction of right eustachian tube       Resolved      No orders of the defined types were placed in this encounter.   Follow up plan: Return in about 6 months (around 06/18/2017) for Medicare Physical CPE.  Nobie Putnam, [facility] 12/16/2016, 1:31 PM

## 2016-12-16 NOTE — Assessment & Plan Note (Signed)
Improved R eustachian tube dysfunction and allergies Continue Loratadine Flonase

## 2017-01-21 ENCOUNTER — Other Ambulatory Visit: Payer: Self-pay

## 2017-01-21 DIAGNOSIS — C20 Malignant neoplasm of rectum: Secondary | ICD-10-CM

## 2017-02-11 ENCOUNTER — Telehealth: Payer: Self-pay | Admitting: *Deleted

## 2017-02-11 LAB — CEA: CEA: 1.8 ng/mL (ref 0.0–4.7)

## 2017-02-11 NOTE — Telephone Encounter (Signed)
-----   Message from Robert Bellow, MD sent at 02/11/2017  7:38 AM EDT ----- Please notify labs were fine. F/ U as scheduled. Thanks.  ----- Message ----- From: Lavone Neri Lab Results In Sent: 02/11/2017   5:37 AM To: Robert Bellow, MD

## 2017-02-11 NOTE — Telephone Encounter (Signed)
Left patient message to call the office.

## 2017-02-14 NOTE — Telephone Encounter (Signed)
Notified patient as instructed, patient pleased. Discussed follow-up appointments, patient agrees  

## 2017-02-17 ENCOUNTER — Encounter: Payer: Self-pay | Admitting: General Surgery

## 2017-02-17 ENCOUNTER — Ambulatory Visit (INDEPENDENT_AMBULATORY_CARE_PROVIDER_SITE_OTHER): Payer: Medicare Other | Admitting: General Surgery

## 2017-02-17 VITALS — BP 148/82 | HR 70 | Resp 14 | Ht 64.0 in | Wt 179.0 lb

## 2017-02-17 DIAGNOSIS — C20 Malignant neoplasm of rectum: Secondary | ICD-10-CM

## 2017-02-17 LAB — POC HEMOCCULT BLD/STL (OFFICE/1-CARD/DIAGNOSTIC): Fecal Occult Blood, POC: NEGATIVE

## 2017-02-17 NOTE — Progress Notes (Signed)
Patient ID: Vanessa Reynolds, female   DOB: May 06, 1946, 71 y.o.   MRN: [id number]  Chief Complaint  Patient presents with  . Colon Cancer    HPI Vanessa Reynolds is a 71 y.o. female here today for her one year colon cancer follow up. Patient states she is doing well. CEA done on 02/10/2017.  Moves her bowels every two days.HPI  Past Medical History:  Diagnosis Date  . Abdominal pain, left lower quadrant 2012  . Acute parametritis and pelvic cellulitis   . Breast screening, unspecified   . H/O cystitis 2011  . Nausea with vomiting   . Obesity, unspecified   . Personal history of malignant neoplasm of large intestine 2013  . Personal history of tobacco use, presenting hazards to health   . 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    Past Surgical History:  Procedure Laterality Date  . APPENDECTOMY  December 2011  . BILATERAL OOPHORECTOMY  December 2011   Completed at time of low anterior resection.  . COLON SURGERY  03/14/10   lap assisted low anterior resection-adenocarcinoma of the rectum and a villous adenoma  . COLONOSCOPY  2011,2012,2015   Dr. Vira Agar, Dr. Addison Lank)    Family History  Problem Relation Age of Onset  . Hypertension Mother        deceased, age 59  . Cancer Father        gastric cancer, deceased, mid 47's    Social History Social History  Substance Use Topics  . Smoking status: Former Smoker    Packs/day: 1.00    Years: 30.00    Types: Cigarettes  . Smokeless tobacco: Former Systems developer     Comment: quit in 1999  . Alcohol use No    Allergies  Allergen Reactions  . Fish Oil Other (See Comments)     Balance was off    Current Outpatient Prescriptions  Medication Sig Dispense Refill  . amLODipine (NORVASC) 2.5 MG tablet Take 1 tablet (2.5 mg total) by mouth daily. 90 tablet 3  . lisinopril (PRINIVIL,ZESTRIL) 40 MG tablet Take 1 tablet (40 mg total) by mouth daily. 90 tablet 3  . loratadine (CLARITIN) 10  MG tablet Take 1 tablet (10 mg total) by mouth daily. Use for 4-6 weeks then stop, and use as needed or seasonally 30 tablet 11  . lovastatin (MEVACOR) 20 MG tablet Take 1 tablet (20 mg total) by mouth at bedtime. 90 tablet 3  . Probiotic Product (PROBIOTIC DAILY PO) Take 1 tablet by mouth daily.     No current facility-administered medications for this visit.     Review of Systems Review of Systems  Constitutional: Negative.   Respiratory: Negative.   Cardiovascular: Negative.     Blood pressure (!) 148/82, pulse 70, resp. rate 14, height 5\' 4"  (1.626 m), weight 179 lb (81.2 kg).  Physical Exam Physical Exam  Constitutional: She is oriented to person, place, and time. She appears well-developed and well-nourished.  Cardiovascular: Normal rate, regular rhythm and intact distal pulses.   Pulmonary/Chest: Effort normal and breath sounds normal.  Abdominal: Soft. Normal appearance and bowel sounds are normal. There is no hepatomegaly. There is no tenderness.    Genitourinary: Rectum normal. Rectal exam shows guaiac negative stool.  Neurological: She is alert and oriented to person, place, and time.  Skin: Skin is warm and dry.    Data Reviewed CEA in April 2018: 1.7.  Most recent CEA September: 1.8.  Determination October 2016: 1.3.  Assessment    No evidence of recurrent cancer.    Plan    Candidate for repeat colonoscopy in 2020.    Patient to return in one year. CEA prior to office visit. The patient is aware to call back for any questions or concerns.  HPI, Physical Exam, Assessment and Plan have been scribed under the direction and in the presence of Hervey Ard, MD.  Gaspar Cola, CMA  I have completed the exam and reviewed the above documentation for accuracy and completeness.  I agree with the above.  Haematologist has been used and any errors in dictation or transcription are unintentional.  Hervey Ard, M.D., F.A.C.S.  Robert Bellow 02/17/2017, 9:31 PM

## 2017-02-17 NOTE — Patient Instructions (Signed)
Patient to return in one year. CEA prior to office visit. The patient is aware to call back for any questions or concerns.

## 2017-02-24 ENCOUNTER — Ambulatory Visit (INDEPENDENT_AMBULATORY_CARE_PROVIDER_SITE_OTHER): Payer: Medicare Other

## 2017-02-24 DIAGNOSIS — Z23 Encounter for immunization: Secondary | ICD-10-CM

## 2017-02-24 NOTE — Progress Notes (Signed)
.  IMM

## 2017-04-07 ENCOUNTER — Other Ambulatory Visit: Payer: Self-pay | Admitting: Family Medicine

## 2017-04-07 DIAGNOSIS — E7849 Other hyperlipidemia: Secondary | ICD-10-CM

## 2017-06-10 ENCOUNTER — Telehealth: Payer: Self-pay | Admitting: Family Medicine

## 2017-06-10 NOTE — Telephone Encounter (Signed)
Called to schedule AWV with Nurse Health Advisor. °Kathryn Brown °[phone]  °Skype [email or web address]  ° °

## 2017-06-13 ENCOUNTER — Other Ambulatory Visit: Payer: Self-pay | Admitting: Family Medicine

## 2017-06-13 DIAGNOSIS — E782 Mixed hyperlipidemia: Secondary | ICD-10-CM

## 2017-06-13 DIAGNOSIS — R7309 Other abnormal glucose: Secondary | ICD-10-CM

## 2017-06-13 DIAGNOSIS — I10 Essential (primary) hypertension: Secondary | ICD-10-CM

## 2017-06-13 DIAGNOSIS — C187 Malignant neoplasm of sigmoid colon: Secondary | ICD-10-CM

## 2017-06-16 ENCOUNTER — Other Ambulatory Visit: Payer: Self-pay

## 2017-06-16 DIAGNOSIS — I10 Essential (primary) hypertension: Secondary | ICD-10-CM | POA: Diagnosis not present

## 2017-06-16 DIAGNOSIS — C187 Malignant neoplasm of sigmoid colon: Secondary | ICD-10-CM | POA: Diagnosis not present

## 2017-06-16 DIAGNOSIS — E782 Mixed hyperlipidemia: Secondary | ICD-10-CM | POA: Diagnosis not present

## 2017-06-16 DIAGNOSIS — R7309 Other abnormal glucose: Secondary | ICD-10-CM | POA: Diagnosis not present

## 2017-06-17 ENCOUNTER — Ambulatory Visit: Payer: Self-pay

## 2017-06-17 LAB — COMPLETE METABOLIC PANEL WITH GFR
AG Ratio: 1 (calc) (ref 1.0–2.5)
ALT: 26 U/L (ref 6–29)
AST: 30 U/L (ref 10–35)
Albumin: 3.7 g/dL (ref 3.6–5.1)
Alkaline phosphatase (APISO): 158 U/L — ABNORMAL HIGH (ref 33–130)
BUN/Creatinine Ratio: 21 (calc) (ref 6–22)
BUN: 20 mg/dL (ref 7–25)
CO2: 29 mmol/L (ref 20–32)
Calcium: 9.4 mg/dL (ref 8.6–10.4)
Chloride: 106 mmol/L (ref 98–110)
Creat: 0.94 mg/dL — ABNORMAL HIGH (ref 0.60–0.93)
GFR, Est African American: 70 mL/min/{1.73_m2} (ref >=60)
GFR, Est Non African American: 61 mL/min/{1.73_m2} (ref >=60)
Globulin: 3.8 g/dL (calc) — ABNORMAL HIGH (ref 1.9–3.7)
Glucose, Bld: 90 mg/dL (ref 65–99)
Potassium: 4.5 mmol/L (ref 3.5–5.3)
Sodium: 139 mmol/L (ref 135–146)
Total Bilirubin: 0.5 mg/dL (ref 0.2–1.2)
Total Protein: 7.5 g/dL (ref 6.1–8.1)

## 2017-06-17 LAB — CBC WITH DIFFERENTIAL/PLATELET
Basophils Absolute: 70 cells/uL (ref 0–200)
Basophils Relative: 1.2 %
Eosinophils Absolute: 412 cells/uL (ref 15–500)
Eosinophils Relative: 7.1 %
HCT: 38.4 % (ref 35.0–45.0)
Hemoglobin: 12.9 g/dL (ref 11.7–15.5)
Lymphs Abs: 1537 cells/uL (ref 850–3900)
MCH: 30.1 pg (ref 27.0–33.0)
MCHC: 33.6 g/dL (ref 32.0–36.0)
MCV: 89.5 fL (ref 80.0–100.0)
MPV: 13.4 fL — ABNORMAL HIGH (ref 7.5–12.5)
Monocytes Relative: 8.7 %
Neutro Abs: 3277 cells/uL (ref 1500–7800)
Neutrophils Relative %: 56.5 %
Platelets: 153 10*3/uL (ref 140–400)
RBC: 4.29 10*6/uL (ref 3.80–5.10)
RDW: 12.4 % (ref 11.0–15.0)
Total Lymphocyte: 26.5 %
WBC mixed population: 505 cells/uL (ref 200–950)
WBC: 5.8 10*3/uL (ref 3.8–10.8)

## 2017-06-17 LAB — LIPID PANEL
Cholesterol: 182 mg/dL (ref <200)
HDL: 65 mg/dL (ref >50)
LDL Cholesterol (Calc): 100 mg/dL (calc) — ABNORMAL HIGH
Non-HDL Cholesterol (Calc): 117 mg/dL (calc) (ref <130)
Total CHOL/HDL Ratio: 2.8 (calc) (ref <5.0)
Triglycerides: 80 mg/dL (ref <150)

## 2017-06-17 LAB — HEMOGLOBIN A1C
Hgb A1c MFr Bld: 5.4 % of total Hgb (ref <5.7)
Mean Plasma Glucose: 108 (calc)
eAG (mmol/L): 6 (calc)

## 2017-06-18 ENCOUNTER — Telehealth: Payer: Self-pay

## 2017-06-18 NOTE — Telephone Encounter (Signed)
Called to reschedule AWV appointment with patient. LM for pt to call back.   Direct call back is [phone]

## 2017-06-23 ENCOUNTER — Ambulatory Visit (INDEPENDENT_AMBULATORY_CARE_PROVIDER_SITE_OTHER): Payer: Medicare HMO | Admitting: Family Medicine

## 2017-06-23 ENCOUNTER — Encounter: Payer: Self-pay | Admitting: Family Medicine

## 2017-06-23 VITALS — BP 135/69 | HR 59 | Temp 98.2°F | Resp 16 | Ht 64.0 in | Wt 177.0 lb

## 2017-06-23 DIAGNOSIS — I10 Essential (primary) hypertension: Secondary | ICD-10-CM

## 2017-06-23 DIAGNOSIS — E7849 Other hyperlipidemia: Secondary | ICD-10-CM | POA: Diagnosis not present

## 2017-06-23 DIAGNOSIS — J3089 Other allergic rhinitis: Secondary | ICD-10-CM

## 2017-06-23 DIAGNOSIS — Z0000 Encounter for general adult medical examination without abnormal findings: Secondary | ICD-10-CM

## 2017-06-23 DIAGNOSIS — Z1159 Encounter for screening for other viral diseases: Secondary | ICD-10-CM

## 2017-06-23 DIAGNOSIS — E782 Mixed hyperlipidemia: Secondary | ICD-10-CM | POA: Diagnosis not present

## 2017-06-23 DIAGNOSIS — Z85048 Personal history of other malignant neoplasm of rectum, rectosigmoid junction, and anus: Secondary | ICD-10-CM

## 2017-06-23 MED ORDER — LOVASTATIN 20 MG PO TABS: 20.0000 mg | ORAL_TABLET | Freq: Every day | ORAL | 3 refills | Status: DC

## 2017-06-23 NOTE — Patient Instructions (Addendum)
Thank you for coming to the office today.   1. Keep up the great work.  2. Refilled Lovastatin. No other med changes  3. Will check Hepatitis C Screening test today -stay tuned for result  DUE for LAB TODAY  Check with scheduling for Annual Medicare Wellness - with Tyler Aas LPN at our office  Please schedule a Follow-up Appointment to: Return in about 6 months (around 12/21/2017) for HTN, HLD.    If you have any other questions or concerns, please feel free to call the office or send a message through [place]. You may also schedule an earlier appointment if necessary.  Additionally, you may be receiving a survey about your experience at our office within a few days to 1 week by e-mail or mail. We value your feedback.  Nobie Putnam, DO [place]

## 2017-06-23 NOTE — Assessment & Plan Note (Addendum)
Stable without evidence or history of recurrence Last CEA 1.7 in 08/2016 Followed by Gen Surgery - Dr Bary Castilla Anticipate further colonoscopy screening

## 2017-06-23 NOTE — Assessment & Plan Note (Addendum)
Controlled cholesterol on statin and improving lifestyle Last lipid panel 05/2017 Calculated ASCVD 10 yr risk score, elevated based on HTN, age  Plan: 1. Continue current meds - Lovastatin 20mg  daily 2. Future consider ASA 81mg  for primary ASCVD risk reduction 3. Encourage improved lifestyle - low carb/cholesterol, reduce portion size, continue improving regular exercise 4. Yearly lipids next 06/2018

## 2017-06-23 NOTE — Assessment & Plan Note (Addendum)
Well controlled HTN No complications Failed Amlodipine 5mg  intolerance, off HCTZ   Plan:  1. Continue current BP meds - Amlodipine 2.5, Lisinopril 40mg  daily 2. Lifestyle Mods - continue try to improve regular exercise (now plan stationary bike), low sodium diet 3. Contiue Monitor BP at home 4. Follow-up 6 months HTN

## 2017-06-23 NOTE — Assessment & Plan Note (Signed)
Likely stable chronic problem R eustachian tube dysfunction

## 2017-06-23 NOTE — Progress Notes (Signed)
Subjective:    Patient ID: Vanessa Reynolds, female    DOB: 09/03/1945, 72 y.o.   MRN: [id number]  Vanessa Reynolds is a 72 y.o. female presenting on 06/23/2017 for Annual Exam and Hypertension   HPI   Here for Annual Physical and Lab Reviews.  CHRONIC HTN: - Last visit with me 12/16/16, for same problem w/o med change at that visit, see prior notes for background information. - Today patient reports no new concerns since last visit. Checking blood pressure at home without concerns. Normal readings Current Meds - Lisinopril 40mg  daily, Amlodipine 2.5mg  nightly Reports good compliance, took meds today. Tolerating well, w/o complaints.  HYPERLIPIDEMIA / OBESITY BMI >30 - Reports no concerns. Last lipid panel 05/2017, controlled with normal TC, TG, improved LDL 120 to 100 - Currently taking Lovastatin 20mg  daily at bedtime, tolerating well without side effects or myalgias Lifestyle - Weight down 2 lbs in >3-4 months - Diet: Improved now, stopped and limited eating ice cream. Drinks mostly water but goal to improve amount. - Exercise: No regular exercise yet, she has plan to start stationary bicycle will start soon. Otherwise active as hair stylist  Follow-up R Ear Eustachian Tube Dysfunction - Last visit with me 12/16/16, treated with continued Flonase / Loratadine, see prior notes for background information. - Interval update with some improvement, but still not resolved. Feels some fluid or pressure in it at times, and has to pop it  History of Rectal Cancer, s/p surgery Following Gen Surgery Dr Bary Castilla, prior colon surgery in 02/2010, resection of adenocarcinoma of rectum. She did not receive chemotherapy. Has not had concerns about recurrence, monitored CEA in past. She has scheduled upcoming colonoscopy per Gen Surg  Abnormal Skin Spots / Moles - Last visit with me 12/16/16, for initial visit for same problems - Interval update: She established with Dr Nehemiah Massed [facility]), had cryotherapy on spot on bridge nose, says it was benign and some spot grew back - Today no new complaints, other than a few moles on back she will get him to look at these spots as well   Health Maintenance:  Due routine Hep C screening - ordered today additional lab draw  UTD Mammo 07/2016 UTD Colonoscopy UTD Vaccinations PNA, Flu, TDap  Depression screen [facility] 2/9 06/23/2017 06/03/2016 03/04/2016  Decreased Interest 0 0 0  Down, Depressed, Hopeless 0 0 0  PHQ - 2 Score 0 0 0    Past Medical History:  Diagnosis Date  . Abdominal pain, left lower quadrant 2012  . Acute parametritis and pelvic cellulitis   . Breast screening, unspecified   . H/O cystitis 2011  . Nausea with vomiting   . Obesity, unspecified   . Personal history of malignant neoplasm of large intestine 2013  . Personal history of tobacco use, presenting hazards to health   . Rectal cancer ([place]) 04/2010   Adenocarcinoma arising in a villous adenoma, PT 1, N0.   Past Surgical History:  Procedure Laterality Date  . APPENDECTOMY  December 2011  . BILATERAL OOPHORECTOMY  December 2011   Completed at time of low anterior resection.  . COLON SURGERY  03/14/10   lap assisted low anterior resection-adenocarcinoma of the rectum and a villous adenoma  . COLONOSCOPY  2011,2012,2015   Dr. Vira Agar, Dr. Addison Lank)   Social History   Socioeconomic History  . Marital status: Married    Spouse name: Not on file  . Number of children: Not on file  .  Years of education: Not on file  . Highest education level: Not on file  Social Needs  . Financial resource strain: Not on file  . Food insecurity - worry: Not on file  . Food insecurity - inability: Not on file  . Transportation needs - medical: Not on file  . Transportation needs - non-medical: Not on file  Occupational History  . Not on file  Tobacco Use  . Smoking status: Former Smoker    Packs/day: 1.00    Years: 30.00    Pack years: 30.00    Types:  Cigarettes  . Smokeless tobacco: Former Systems developer  . Tobacco comment: quit in 1999  Substance and Sexual Activity  . Alcohol use: No  . Drug use: No  . Sexual activity: Not on file  Other Topics Concern  . Not on file  Social History Narrative  . Not on file   Family History  Problem Relation Age of Onset  . Hypertension Mother        deceased, age 65  . Cancer Father        gastric cancer, deceased, mid 13's   Current Outpatient Medications on File Prior to Visit  Medication Sig  . amLODipine (NORVASC) 2.5 MG tablet Take 1 tablet (2.5 mg total) by mouth daily.  Marland Kitchen lisinopril (PRINIVIL,ZESTRIL) 40 MG tablet Take 1 tablet (40 mg total) by mouth daily.  Marland Kitchen loratadine (CLARITIN) 10 MG tablet Take 1 tablet (10 mg total) by mouth daily. Use for 4-6 weeks then stop, and use as needed or seasonally  . Probiotic Product (PROBIOTIC DAILY PO) Take 1 tablet by mouth daily.  . vitamin B-12 (CYANOCOBALAMIN) 1000 MCG tablet Take 1,000 mcg by mouth daily.   No current facility-administered medications on file prior to visit.     Review of Systems  Constitutional: Negative for activity change, appetite change, chills, diaphoresis, fatigue and fever.  HENT: Negative for congestion, hearing loss and sinus pressure.        Right ear pressure  Eyes: Negative for visual disturbance.  Respiratory: Negative for apnea, cough, choking, chest tightness, shortness of breath and wheezing.   Cardiovascular: Negative for chest pain, palpitations and leg swelling.  Gastrointestinal: Negative for abdominal pain, anal bleeding, blood in stool, constipation, diarrhea, nausea and vomiting.  Endocrine: Negative for cold intolerance.  Genitourinary: Negative for difficulty urinating, dysuria, frequency, hematuria and urgency.  Musculoskeletal: Negative for arthralgias, back pain and neck pain.  Skin: Negative for rash.  Allergic/Immunologic: Negative for environmental allergies.  Neurological: Negative for dizziness,  weakness, light-headedness, numbness and headaches.  Hematological: Negative for adenopathy.  Psychiatric/Behavioral: Negative for behavioral problems, dysphoric mood and sleep disturbance. The patient is not nervous/anxious.    Per HPI unless specifically indicated above     Objective:    BP 135/69   Pulse (!) 59   Temp 98.2 F (36.8 C) (Oral)   Resp 16   Ht 5\' 4"  (1.626 m)   Wt 177 lb (80.3 kg)   BMI 30.38 kg/m   Wt Readings from Last 3 Encounters:  06/23/17 177 lb (80.3 kg)  02/17/17 179 lb (81.2 kg)  12/16/16 179 lb (81.2 kg)    Physical Exam  Constitutional: She is oriented to person, place, and time. She appears well-developed and well-nourished. No distress.  Well-appearing, comfortable, cooperative  HENT:  Head: Normocephalic and atraumatic.  Mouth/Throat: Oropharynx is clear and moist.  Frontal / maxillary sinuses non-tender. Nares patent without purulence or edema. R TM with some mild opaque  effusion similar to previous, without erythema bulging or abnormality, no cerumen. Left TM mild cerumen partially obscuring but TM normal.  Oropharynx clear without erythema, exudates, edema or asymmetry.  Eyes: Conjunctivae and EOM are normal. Pupils are equal, round, and reactive to light. Right eye exhibits no discharge. Left eye exhibits no discharge.  Neck: Normal range of motion. Neck supple. No thyromegaly present.  Cardiovascular: Normal rate, regular rhythm, normal heart sounds and intact distal pulses.  No murmur heard. Pulmonary/Chest: Effort normal and breath sounds normal. No respiratory distress. She has no wheezes. She has no rales.  Abdominal: Soft. Bowel sounds are normal. She exhibits no distension and no mass. There is no tenderness.  Musculoskeletal: Normal range of motion. She exhibits no edema or tenderness.  Upper / Lower Extremities: - Normal muscle tone, strength bilateral upper extremities 5/5, lower extremities 5/5  Lymphadenopathy:    She has no  cervical adenopathy.  Neurological: She is alert and oriented to person, place, and time.  Distal sensation intact to light touch all extremities  Skin: Skin is warm and dry. No rash noted. She is not diaphoretic. No erythema.  Psychiatric: She has a normal mood and affect. Her behavior is normal.  Well groomed, good eye contact, normal speech and thoughts  Nursing note and vitals reviewed.  Results for orders placed or performed in visit on 06/13/17  CBC with Differential/Platelet  Result Value Ref Range   WBC 5.8 3.8 - 10.8 Thousand/uL   RBC 4.29 3.80 - 5.10 Million/uL   Hemoglobin 12.9 11.7 - 15.5 g/dL   HCT 38.4 35.0 - 45.0 %   MCV 89.5 80.0 - 100.0 fL   MCH 30.1 27.0 - 33.0 pg   MCHC 33.6 32.0 - 36.0 g/dL   RDW 12.4 11.0 - 15.0 %   Platelets 153 140 - 400 Thousand/uL   MPV 13.4 (H) 7.5 - 12.5 fL   Neutro Abs 3,277 1,500 - 7,800 cells/uL   Lymphs Abs 1,537 850 - 3,900 cells/uL   WBC mixed population 505 200 - 950 cells/uL   Eosinophils Absolute 412 15 - 500 cells/uL   Basophils Absolute 70 0 - 200 cells/uL   Neutrophils Relative % 56.5 %   Total Lymphocyte 26.5 %   Monocytes Relative 8.7 %   Eosinophils Relative 7.1 %   Basophils Relative 1.2 %  Hemoglobin A1c  Result Value Ref Range   Hgb A1c MFr Bld 5.4 <5.7 % of total Hgb   Mean Plasma Glucose 108 (calc)   eAG (mmol/L) 6.0 (calc)  Lipid panel  Result Value Ref Range   Cholesterol 182 <200 mg/dL   HDL 65 >50 mg/dL   Triglycerides 80 <150 mg/dL   LDL Cholesterol (Calc) 100 (H) mg/dL (calc)   Total CHOL/HDL Ratio 2.8 <5.0 (calc)   Non-HDL Cholesterol (Calc) 117 <130 mg/dL (calc)  COMPLETE METABOLIC PANEL WITH GFR  Result Value Ref Range   Glucose, Bld 90 65 - 99 mg/dL   BUN 20 7 - 25 mg/dL   Creat 0.94 (H) 0.60 - 0.93 mg/dL   GFR, Est Non African American 61 > OR = 60 mL/min/1.56m2   GFR, Est African American 70 > OR = 60 mL/min/1.9m2   BUN/Creatinine Ratio 21 6 - 22 (calc)   Sodium 139 135 - 146 mmol/L    Potassium 4.5 3.5 - 5.3 mmol/L   Chloride 106 98 - 110 mmol/L   CO2 29 20 - 32 mmol/L   Calcium 9.4 8.6 - 10.4 mg/dL  Total Protein 7.5 6.1 - 8.1 g/dL   Albumin 3.7 3.6 - 5.1 g/dL   Globulin 3.8 (H) 1.9 - 3.7 g/dL (calc)   AG Ratio 1.0 1.0 - 2.5 (calc)   Total Bilirubin 0.5 0.2 - 1.2 mg/dL   Alkaline phosphatase (APISO) 158 (H) 33 - 130 U/L   AST 30 10 - 35 U/L   ALT 26 6 - 29 U/L      Assessment & Plan:   Problem List Items Addressed This Visit    Allergic rhinitis due to allergen    Likely stable chronic problem R eustachian tube dysfunction      Essential hypertension    Well controlled HTN No complications Failed Amlodipine 5mg  intolerance, off HCTZ   Plan:  1. Continue current BP meds - Amlodipine 2.5, Lisinopril 40mg  daily 2. Lifestyle Mods - continue try to improve regular exercise (now plan stationary bike), low sodium diet 3. Contiue Monitor BP at home 4. Follow-up 6 months HTN      Relevant Medications   lovastatin (MEVACOR) 20 MG tablet   History of rectal cancer    Stable without evidence or history of recurrence Last CEA 1.7 in 08/2016 Followed by Gen Surgery - Dr Bary Castilla Anticipate further colonoscopy screening      Hyperlipidemia    Controlled cholesterol on statin and improving lifestyle Last lipid panel 05/2017 Calculated ASCVD 10 yr risk score, elevated based on HTN, age  Plan: 1. Continue current meds - Lovastatin 20mg  daily 2. Future consider ASA 81mg  for primary ASCVD risk reduction 3. Encourage improved lifestyle - low carb/cholesterol, reduce portion size, continue improving regular exercise 4. Yearly lipids next 06/2018      Relevant Medications   lovastatin (MEVACOR) 20 MG tablet    Other Visit Diagnoses    Annual physical exam    -  Primary UTD on health maintenance Encouraged continue improving healthy lifestyle Improve exercise, start with stationary bike Continue improving diet Reviewed wt management goals BMI < 30    Need  for hepatitis C screening test       Relevant Orders   Hepatitis C antibody   Abnormal moles Follow-up with Derm Dr Nehemiah Massed for further eval    Meds ordered this encounter  Medications  . lovastatin (MEVACOR) 20 MG tablet    Sig: Take 1 tablet (20 mg total) by mouth at bedtime.    Dispense:  90 tablet    Refill:  3    Follow up plan: Return in about 6 months (around 12/21/2017) for HTN, HLD.  Nobie Putnam, [facility] 06/23/2017, 8:58 PM

## 2017-06-24 LAB — HEPATITIS C ANTIBODY
Hepatitis C Ab: NONREACTIVE
SIGNAL TO CUT-OFF: 0.03 (ref <1.00)

## 2017-07-01 ENCOUNTER — Ambulatory Visit (INDEPENDENT_AMBULATORY_CARE_PROVIDER_SITE_OTHER): Payer: Medicare HMO

## 2017-07-01 VITALS — BP 150/86 | HR 64 | Temp 98.5°F | Resp 16 | Ht 64.0 in | Wt 175.4 lb

## 2017-07-01 DIAGNOSIS — Z1239 Encounter for other screening for malignant neoplasm of breast: Secondary | ICD-10-CM

## 2017-07-01 DIAGNOSIS — Z0000 Encounter for general adult medical examination without abnormal findings: Secondary | ICD-10-CM | POA: Diagnosis not present

## 2017-07-01 DIAGNOSIS — Z1231 Encounter for screening mammogram for malignant neoplasm of breast: Secondary | ICD-10-CM | POA: Diagnosis not present

## 2017-07-01 NOTE — Patient Instructions (Addendum)
Vanessa Reynolds , Thank you for taking time to come for your Medicare Wellness Visit. I appreciate your ongoing commitment to your health goals. Please review the following plan we discussed and let me know if I can assist you in the future.   Screening recommendations/referrals: Colonoscopy: completed 04/20/2014 Mammogram: completed 08/05/2016. Please call [phone] to schedule your mammogram.  Bone Density: completed 04/20/2014 Recommended yearly ophthalmology/optometry visit for glaucoma screening and checkup Recommended yearly dental visit for hygiene and checkup  Vaccinations: Influenza vaccine: up to date  Pneumococcal vaccine: up to date  Tdap vaccine: up to date Shingles vaccine: due, check with your insurance company for coverage   Advanced directives:Advance directive discussed with you today. I have provided a copy for you to complete at home and have notarized. Once this is complete please bring a copy in to our office so we can scan it into your chart.  Conditions/risks identified: Recommend drinking at least 6-8 glasses of water a day   Next appointment: Follow up in one year for your annual wellness exam.    Preventive Care 65 Years and Older, Female Preventive care refers to lifestyle choices and visits with your health care provider that can promote health and wellness. What does preventive care include?  A yearly physical exam. This is also called an annual well check.  Dental exams once or twice a year.  Routine eye exams. Ask your health care provider how often you should have your eyes checked.  Personal lifestyle choices, including:  Daily care of your teeth and gums.  Regular physical activity.  Eating a healthy diet.  Avoiding tobacco and drug use.  Limiting alcohol use.  Practicing safe sex.  Taking low-dose aspirin every day.  Taking vitamin and mineral supplements as recommended by your health care provider. What happens during an annual well  check? The services and screenings done by your health care provider during your annual well check will depend on your age, overall health, lifestyle risk factors, and family history of disease. Counseling  Your health care provider may ask you questions about your:  Alcohol use.  Tobacco use.  Drug use.  Emotional well-being.  Home and relationship well-being.  Sexual activity.  Eating habits.  History of falls.  Memory and ability to understand (cognition).  Work and work Statistician.  Reproductive health. Screening  You may have the following tests or measurements:  Height, weight, and BMI.  Blood pressure.  Lipid and cholesterol levels. These may be checked every 5 years, or more frequently if you are over 37 years old.  Skin check.  Lung cancer screening. You may have this screening every year starting at age 53 if you have a 30-pack-year history of smoking and currently smoke or have quit within the past 15 years.  Fecal occult blood test (FOBT) of the stool. You may have this test every year starting at age 58.  Flexible sigmoidoscopy or colonoscopy. You may have a sigmoidoscopy every 5 years or a colonoscopy every 10 years starting at age 33.  Hepatitis C blood test.  Hepatitis B blood test.  Sexually transmitted disease (STD) testing.  Diabetes screening. This is done by checking your blood sugar (glucose) after you have not eaten for a while (fasting). You may have this done every 1-3 years.  Bone density scan. This is done to screen for osteoporosis. You may have this done starting at age 35.  Mammogram. This may be done every 1-2 years. Talk to your health care provider  about how often you should have regular mammograms. Talk with your health care provider about your test results, treatment options, and if necessary, the need for more tests. Vaccines  Your health care provider may recommend certain vaccines, such as:  Influenza vaccine. This is  recommended every year.  Tetanus, diphtheria, and acellular pertussis (Tdap, Td) vaccine. You may need a Td booster every 10 years.  Zoster vaccine. You may need this after age 66.  Pneumococcal 13-valent conjugate (PCV13) vaccine. One dose is recommended after age 6.  Pneumococcal polysaccharide (PPSV23) vaccine. One dose is recommended after age 69. Talk to your health care provider about which screenings and vaccines you need and how often you need them. This information is not intended to replace advice given to you by your health care provider. Make sure you discuss any questions you have with your health care provider. Document Released: 06/02/2015 Document Revised: 01/24/2016 Document Reviewed: 03/07/2015 Elsevier Interactive Patient Education  2017 [place] Prevention in the Home Falls can cause injuries. They can happen to people of all ages. There are many things you can do to make your home safe and to help prevent falls. What can I do on the outside of my home?  Regularly fix the edges of walkways and driveways and fix any cracks.  Remove anything that might make you trip as you walk through a door, such as a raised step or threshold.  Trim any bushes or trees on the path to your home.  Use bright outdoor lighting.  Clear any walking paths of anything that might make someone trip, such as rocks or tools.  Regularly check to see if handrails are loose or broken. Make sure that both sides of any steps have handrails.  Any raised decks and porches should have guardrails on the edges.  Have any leaves, snow, or ice cleared regularly.  Use sand or salt on walking paths during winter.  Clean up any spills in your garage right away. This includes oil or grease spills. What can I do in the bathroom?  Use night lights.  Install grab bars by the toilet and in the tub and shower. Do not use towel bars as grab bars.  Use non-skid mats or decals in the tub or  shower.  If you need to sit down in the shower, use a plastic, non-slip stool.  Keep the floor dry. Clean up any water that spills on the floor as soon as it happens.  Remove soap buildup in the tub or shower regularly.  Attach bath mats securely with double-sided non-slip rug tape.  Do not have throw rugs and other things on the floor that can make you trip. What can I do in the bedroom?  Use night lights.  Make sure that you have a light by your bed that is easy to reach.  Do not use any sheets or blankets that are too big for your bed. They should not hang down onto the floor.  Have a firm chair that has side arms. You can use this for support while you get dressed.  Do not have throw rugs and other things on the floor that can make you trip. What can I do in the kitchen?  Clean up any spills right away.  Avoid walking on wet floors.  Keep items that you use a lot in easy-to-reach places.  If you need to reach something above you, use a strong step stool that has a grab bar.  Keep electrical cords out of the way.  Do not use floor polish or wax that makes floors slippery. If you must use wax, use non-skid floor wax.  Do not have throw rugs and other things on the floor that can make you trip. What can I do with my stairs?  Do not leave any items on the stairs.  Make sure that there are handrails on both sides of the stairs and use them. Fix handrails that are broken or loose. Make sure that handrails are as long as the stairways.  Check any carpeting to make sure that it is firmly attached to the stairs. Fix any carpet that is loose or worn.  Avoid having throw rugs at the top or bottom of the stairs. If you do have throw rugs, attach them to the floor with carpet tape.  Make sure that you have a light switch at the top of the stairs and the bottom of the stairs. If you do not have them, ask someone to add them for you. What else can I do to help prevent  falls?  Wear shoes that:  Do not have high heels.  Have rubber bottoms.  Are comfortable and fit you well.  Are closed at the toe. Do not wear sandals.  If you use a stepladder:  Make sure that it is fully opened. Do not climb a closed stepladder.  Make sure that both sides of the stepladder are locked into place.  Ask someone to hold it for you, if possible.  Clearly mark and make sure that you can see:  Any grab bars or handrails.  First and last steps.  Where the edge of each step is.  Use tools that help you move around (mobility aids) if they are needed. These include:  Canes.  Walkers.  Scooters.  Crutches.  Turn on the lights when you go into a dark area. Replace any light bulbs as soon as they burn out.  Set up your furniture so you have a clear path. Avoid moving your furniture around.  If any of your floors are uneven, fix them.  If there are any pets around you, be aware of where they are.  Review your medicines with your doctor. Some medicines can make you feel dizzy. This can increase your chance of falling. Ask your doctor what other things that you can do to help prevent falls. This information is not intended to replace advice given to you by your health care provider. Make sure you discuss any questions you have with your health care provider. Document Released: 03/02/2009 Document Revised: 10/12/2015 Document Reviewed: 06/10/2014 Elsevier Interactive Patient Education  2017 [employer].

## 2017-07-01 NOTE — Progress Notes (Signed)
Subjective:   Vanessa Reynolds is a 72 y.o. female who presents for Medicare Annual (Subsequent) preventive examination.  Review of Systems:  Cardiac Risk Factors include: hypertension;advanced age (>23men, >73 women);dyslipidemia     Objective:     Vitals: BP (!) 150/86 (BP Location: Left Arm, Patient Position: Sitting)   Pulse 64   Temp 98.5 F (36.9 C) (Oral)   Resp 16   Ht 5\' 4"  (1.626 m)   Wt 175 lb 6.4 oz (79.6 kg)   BMI 30.11 kg/m   Body mass index is 30.11 kg/m.  Advanced Directives 07/01/2017  Does Patient Have a Medical Advance Directive? No  Would patient like information on creating a medical advance directive? Yes (MAU/Ambulatory/Procedural Areas - Information given)    Tobacco Social History   Tobacco Use  Smoking Status Former Smoker  . Packs/day: 1.00  . Years: 30.00  . Pack years: 30.00  . Types: Cigarettes  . Last attempt to quit: 05/20/1997  . Years since quitting: 20.1  Smokeless Tobacco Former Systems developer  Tobacco Comment   quit in [place] given: Not Answered Comment: quit in 1999   Clinical Intake:  Pre-visit preparation completed: Yes  Pain : No/denies pain     Nutritional Status: BMI > 30  Obese Nutritional Risks: None Diabetes: No  How often do you need to have someone help you when you read instructions, pamphlets, or other written materials from your doctor or pharmacy?: 1 - Never What is the last grade level you completed in school?: 10th grade  Interpreter Needed?: No  Information entered by :: Mousa Prout,LPN   Past Medical History:  Diagnosis Date  . Abdominal pain, left lower quadrant 2012  . Acute parametritis and pelvic cellulitis   . Breast screening, unspecified   . H/O cystitis 2011  . Nausea with vomiting   . Obesity, unspecified   . Personal history of malignant neoplasm of large intestine 2013  . Personal history of tobacco use, presenting hazards to health   . 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   Past Surgical History:  Procedure Laterality Date  . APPENDECTOMY  December 2011  . BILATERAL OOPHORECTOMY  December 2011   Completed at time of low anterior resection.  . COLON SURGERY  03/14/10   lap assisted low anterior resection-adenocarcinoma of the rectum and a villous adenoma  . COLONOSCOPY  2011,2012,2015   Dr. Vira Agar, Dr. Addison Lank)   Family History  Problem Relation Age of Onset  . Hypertension Mother        deceased, age 73  . Cancer Father        gastric cancer, deceased, mid 60's   Social History   Socioeconomic History  . Marital status: Married    Spouse name: None  . Number of children: None  . Years of education: None  . Highest education level: None  Social Needs  . Financial resource strain: Not hard at all  . Food insecurity - worry: Never true  . Food insecurity - inability: Never true  . Transportation needs - medical: No  . Transportation needs - non-medical: No  Occupational History  . None  Tobacco Use  . Smoking status: Former Smoker    Packs/day: 1.00    Years: 30.00    Pack years: 30.00    Types: Cigarettes    Last attempt to quit: 05/20/1997    Years since quitting: 20.1  .  Smokeless tobacco: Former Systems developer  . Tobacco comment: quit in 1999  Substance and Sexual Activity  . Alcohol use: No  . Drug use: No  . Sexual activity: None  Other Topics Concern  . None  Social History Narrative  . None    Outpatient Encounter Medications as of 07/01/2017  Medication Sig  . amLODipine (NORVASC) 2.5 MG tablet Take 1 tablet (2.5 mg total) by mouth daily.  Marland Kitchen lisinopril (PRINIVIL,ZESTRIL) 40 MG tablet Take 1 tablet (40 mg total) by mouth daily.  Marland Kitchen loratadine (CLARITIN) 10 MG tablet Take 1 tablet (10 mg total) by mouth daily. Use for 4-6 weeks then stop, and use as needed or seasonally  . lovastatin (MEVACOR) 20 MG tablet Take 1 tablet (20 mg total) by mouth at bedtime.  . Probiotic  Product (PROBIOTIC DAILY PO) Take 1 tablet by mouth daily.  . vitamin B-12 (CYANOCOBALAMIN) 1000 MCG tablet Take 1,000 mcg by mouth daily.   No facility-administered encounter medications on file as of 07/01/2017.     Activities of Daily Living In your present state of health, do you have any difficulty performing the following activities: 07/01/2017 06/23/2017  Hearing? Y N  Comment right ear  -  Vision? N N  Difficulty concentrating or making decisions? N N  Walking or climbing stairs? N N  Dressing or bathing? N N  Doing errands, shopping? N N  Preparing Food and eating ? N -  Using the Toilet? N -  In the past six months, have you accidently leaked urine? N -  Do you have problems with loss of bowel control? N -  Managing your Medications? N -  Managing your Finances? N -  Housekeeping or managing your Housekeeping? N -  Some recent data might be hidden    Patient Care Team: Olin Hauser, DO as PCP - General (Family Medicine) Bary Castilla Forest Gleason, MD (General Surgery)    Assessment:   This is a routine wellness examination for Vanessa Reynolds.  Exercise Activities and Dietary recommendations Current Exercise Habits: The patient does not participate in regular exercise at present, Exercise limited by: None identified  Goals    . DIET - INCREASE WATER INTAKE     Recommend drinking at least 6-8 glasses of water a day        Fall Risk Fall Risk  07/01/2017 06/23/2017 06/03/2016 03/04/2016 07/31/2015  Falls in the past year? No No No No No   Is the patient's home free of loose throw rugs in walkways, pet beds, electrical cords, etc?   yes      Grab bars in the bathroom? yes      Handrails on the stairs?   yes      Adequate lighting?   yes  Timed Get Up and Go performed: Completed in8 seconds with no use of assistive devices, steady gait. No intervention needed at this time.   Depression Screen PHQ 2/9 Scores 07/01/2017 06/23/2017 06/03/2016 03/04/2016  PHQ - 2 Score 0 0 0 0       Cognitive Function     6CIT Screen 07/01/2017  What Year? 0 points  What month? 0 points  What time? 0 points  Count back from 20 0 points  Months in reverse 0 points  Repeat phrase 0 points  Total Score 0    Immunization History  Administered Date(s) Administered  . Influenza, High Dose Seasonal PF 02/20/2015, 03/04/2016, 02/24/2017  . Pneumococcal Conjugate-13 12/27/2013  . Pneumococcal Polysaccharide-23 05/20/2009  Qualifies for Shingles Vaccine? Discussed shingrix vaccine   Screening Tests Health Maintenance  Topic Date Due  . MAMMOGRAM  08/06/2018  . COLONOSCOPY  04/20/2024  . TETANUS/TDAP  03/26/2025  . INFLUENZA VACCINE  Completed  . DEXA SCAN  Completed  . Hepatitis C Screening  Completed  . PNA vac Low Risk Adult  Completed    Cancer Screenings: Lung: Low Dose CT Chest recommended if Age 59-80 years, 30 pack-year currently smoking OR have quit w/in 15years. Patient does not qualify. Breast:  Up to date on Mammogram? Yes   Up to date of Bone Density/Dexa? Yes Colorectal: completed 04/20/2014  Additional Screenings:  Hepatitis B/HIV/Syphillis: not indicated  Hepatitis C Screening: completed 06/23/2017     Plan:    I have personally reviewed and addressed the Medicare Annual Wellness questionnaire and have noted the following in the patient's chart:  A. Medical and social history B. Use of alcohol, tobacco or illicit drugs  C. Current medications and supplements D. Functional ability and status E.  Nutritional status F.  Physical activity G. Advance directives H. List of other physicians I.  Hospitalizations, surgeries, and ER visits in previous 12 months J.  [facility] such as hearing and vision if needed, cognitive and depression L. Referrals and appointments   In addition, I have reviewed and discussed with patient certain preventive protocols, quality metrics, and best practice recommendations. A written personalized care plan for  preventive services as well as general preventive health recommendations were provided to patient.   Signed,  Tyler Aas, LPN Nurse Health Advisor   Nurse Notes:none

## 2017-10-22 ENCOUNTER — Other Ambulatory Visit: Payer: Self-pay | Admitting: Family Medicine

## 2017-10-22 DIAGNOSIS — Z1231 Encounter for screening mammogram for malignant neoplasm of breast: Secondary | ICD-10-CM

## 2017-11-07 ENCOUNTER — Other Ambulatory Visit: Payer: Self-pay | Admitting: Family Medicine

## 2017-11-07 DIAGNOSIS — I10 Essential (primary) hypertension: Secondary | ICD-10-CM

## 2017-12-01 ENCOUNTER — Ambulatory Visit
Admission: RE | Admit: 2017-12-01 | Discharge: 2017-12-01 | Disposition: A | Discharge status: Home / Self Care | Payer: Medicare HMO | Source: Ambulatory Visit | Attending: Family Medicine | Admitting: Family Medicine

## 2017-12-01 ENCOUNTER — Encounter: Payer: Self-pay | Admitting: Radiology

## 2017-12-01 DIAGNOSIS — Z1231 Encounter for screening mammogram for malignant neoplasm of breast: Secondary | ICD-10-CM | POA: Insufficient documentation

## 2017-12-01 IMAGING — MG MM DIGITAL SCREENING BILAT W/ TOMO W/ CAD
8 series · 8 of 24 positions shown · non-contrast
Comparison: Previous exam(s).

CLINICAL DATA: Screening.

EXAM:
DIGITAL SCREENING BILATERAL MAMMOGRAM WITH TOMO AND CAD

[L MLO synth-2D]
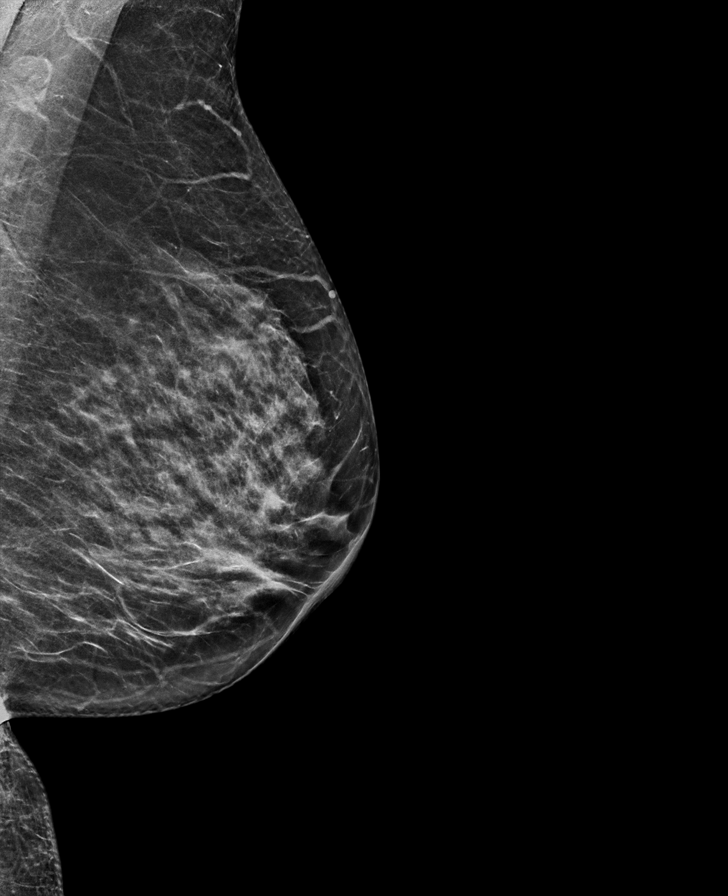

[L CC synth-2D]
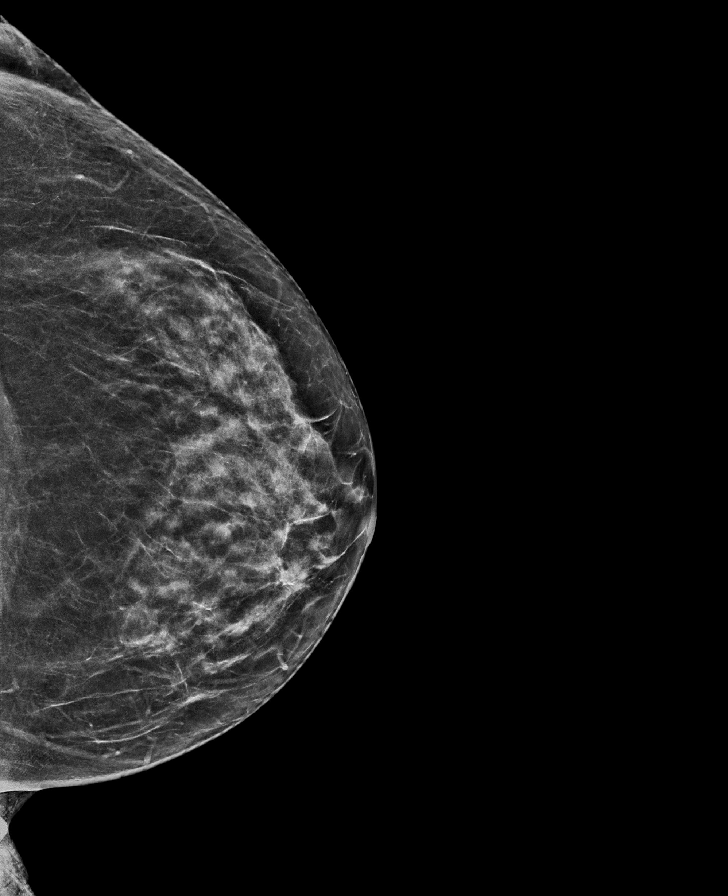

[R MLO synth-2D]
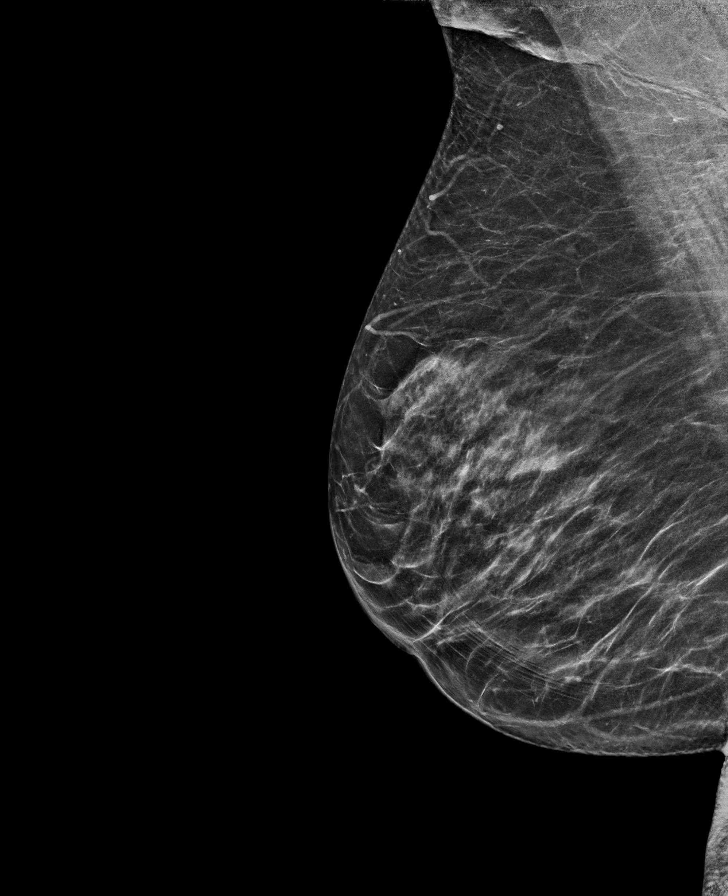

[R CC synth-2D]
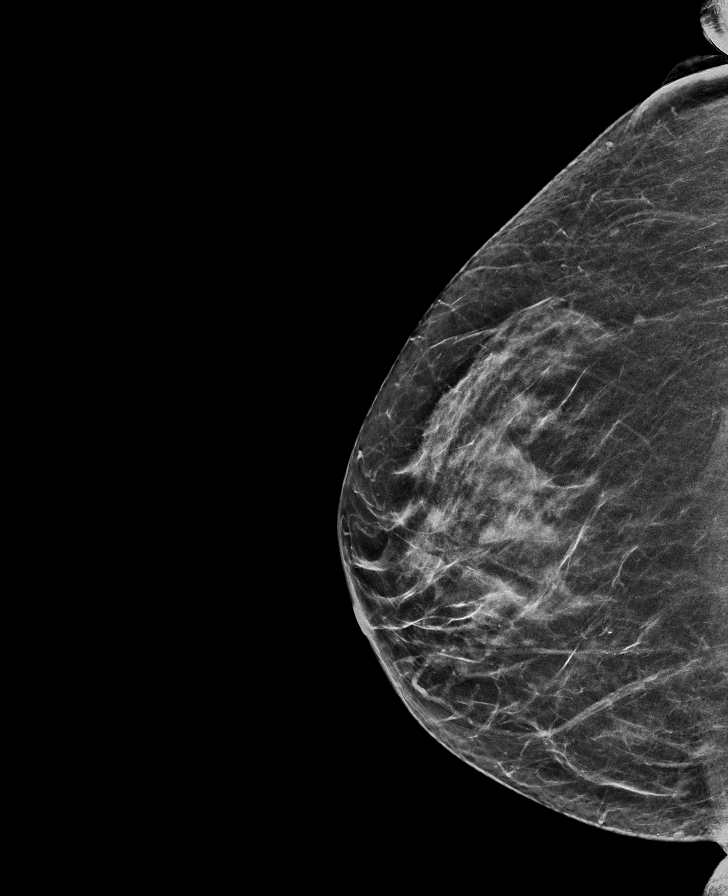

[L CC tomo · tomo slice 31/61.0]
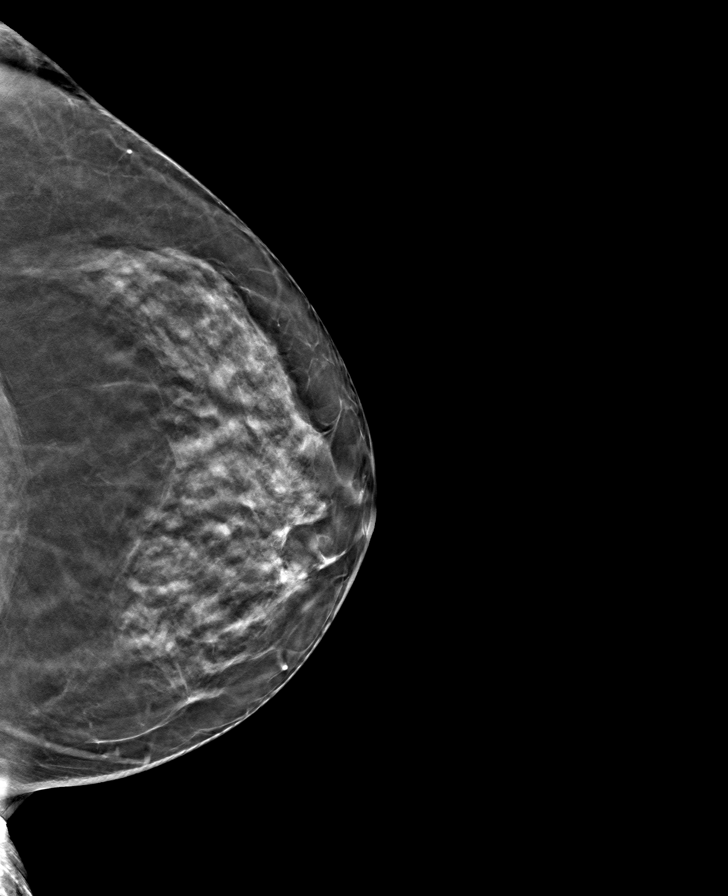

[L MLO tomo · tomo slice 31/60.0]
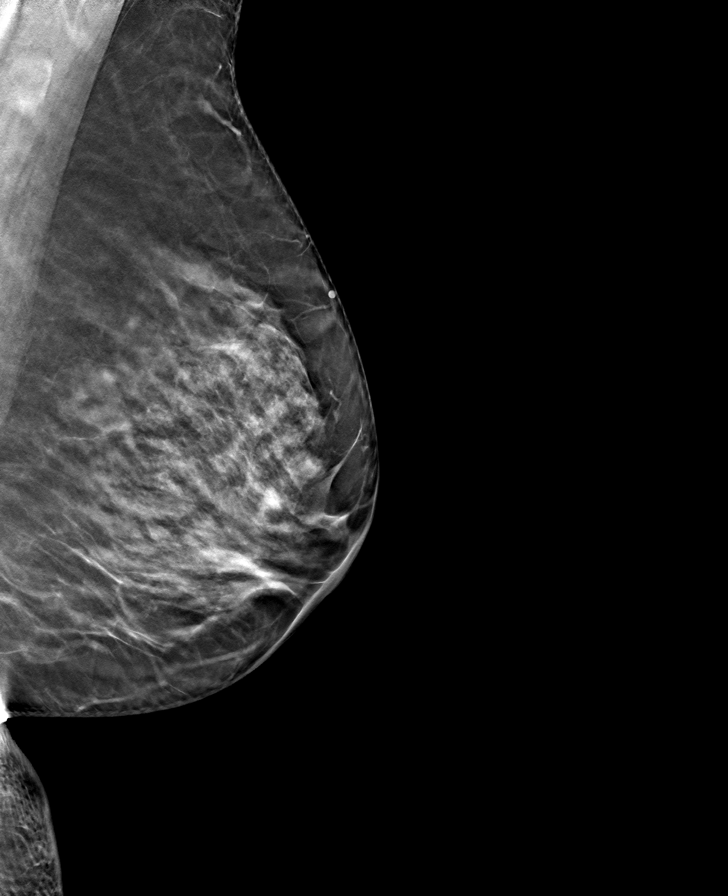

[R MLO tomo · tomo slice 29/58.0]
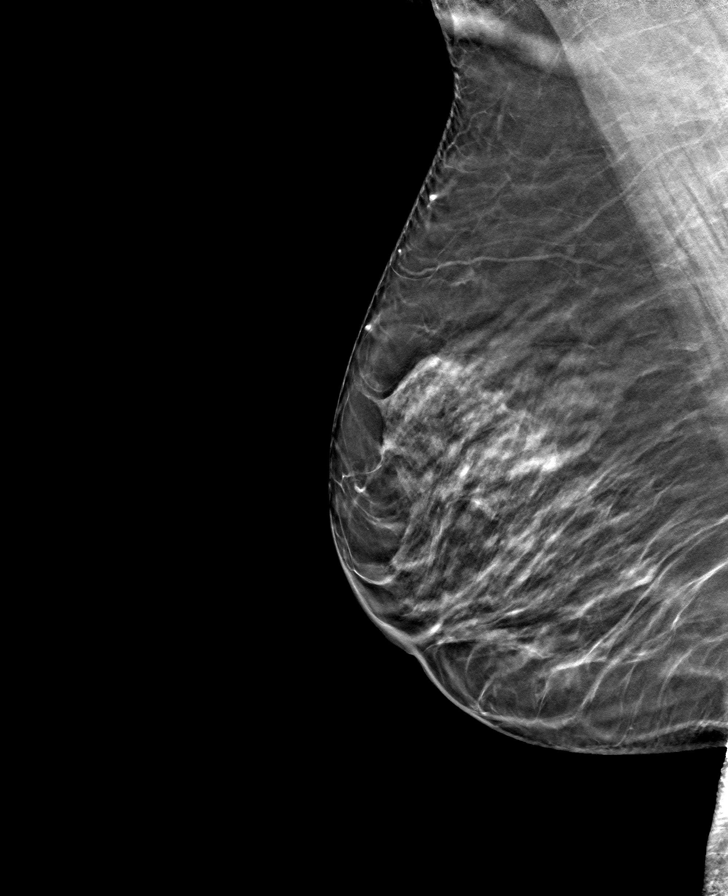

[R CC tomo · tomo slice 31/60.0]
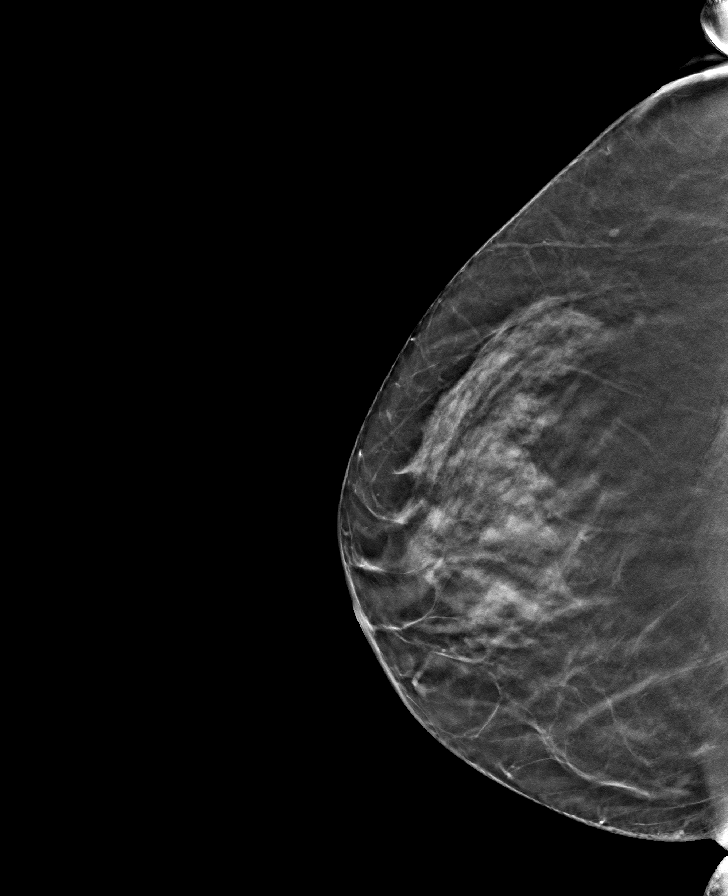

[8 of 24 positions shown; findings below may reference images not displayed]

ACR Breast Density Category c: The breast tissue is heterogeneously
dense, which may obscure small masses.
FINDINGS: There are no findings suspicious for malignancy. Images were
processed with CAD.
IMPRESSION: No mammographic evidence of malignancy. A result letter of this
screening mammogram will be mailed directly to the patient.

RECOMMENDATION:
Screening mammogram in one year. (Code:[5V])

BI-RADS CATEGORY  1: Negative.

## 2017-12-29 ENCOUNTER — Encounter: Payer: Self-pay | Admitting: Family Medicine

## 2017-12-29 ENCOUNTER — Other Ambulatory Visit: Payer: Self-pay | Admitting: Family Medicine

## 2017-12-29 ENCOUNTER — Ambulatory Visit (INDEPENDENT_AMBULATORY_CARE_PROVIDER_SITE_OTHER): Payer: Medicare HMO | Admitting: Family Medicine

## 2017-12-29 VITALS — BP 139/64 | HR 58 | Temp 98.1°F | Resp 16 | Ht 64.0 in | Wt 170.0 lb

## 2017-12-29 DIAGNOSIS — E663 Overweight: Secondary | ICD-10-CM | POA: Diagnosis not present

## 2017-12-29 DIAGNOSIS — E782 Mixed hyperlipidemia: Secondary | ICD-10-CM | POA: Diagnosis not present

## 2017-12-29 DIAGNOSIS — I10 Essential (primary) hypertension: Secondary | ICD-10-CM

## 2017-12-29 DIAGNOSIS — Z0000 Encounter for general adult medical examination without abnormal findings: Secondary | ICD-10-CM

## 2017-12-29 DIAGNOSIS — R799 Abnormal finding of blood chemistry, unspecified: Secondary | ICD-10-CM

## 2017-12-29 NOTE — Progress Notes (Signed)
Subjective:    Patient ID: Vanessa Reynolds, female    DOB: 09/12/1945, 72 y.o.   MRN: [id number]  Vanessa Reynolds is a 72 y.o. female presenting on 12/29/2017 for Hypertension   HPI   CHRONIC HTN: - Last visit with me 06/2017, for same problemw/o med change at that visit, doing better on low dose Amlodipine.see prior notes for background information. - Today patient reportsno new concerns since last visit. Has not checked BP regularly now. Feels good, she can usually tell if BP elevated. Current Meds - Lisinopril 40mg  daily, Amlodipine 2.5mg  nightly Reports good compliance, took meds today. Tolerating well, w/o complaints. Denies CP, dyspnea, HA, edema, dizziness / lightheadedness  HYPERLIPIDEMIA / Overweight BMI >29 - Reports no concerns. Last lipid panel 05/2017, controlled with normal TC, TG, improved LDL 120 to 100 - Currently taking Lovastatin 20mg  daily at bedtime, tolerating well without side effects or myalgias Lifestyle: - Down 6 lbs in 6 months. She had top teeth pulled in April 2019, will wait 6-12 months for denture top. - Diet: Still improving diet. Drinks mostly water. - Exercise: Improved regular exercise  Health Maintenance:  Due for Flu Shot - will return in October 2019  Depression screen [facility] 2/9 12/29/2017 07/01/2017 06/23/2017  Decreased Interest 0 0 0  Down, Depressed, Hopeless 0 0 0  PHQ - 2 Score 0 0 0    Social History   Tobacco Use  . Smoking status: Former Smoker    Packs/day: 1.00    Years: 30.00    Pack years: 30.00    Types: Cigarettes    Last attempt to quit: 05/20/1997    Years since quitting: 20.6  . Smokeless tobacco: Former Systems developer  . Tobacco comment: quit in 1999  Substance Use Topics  . Alcohol use: No  . Drug use: No    Review of Systems Per HPI unless specifically indicated above     Objective:    BP 139/64   Pulse (!) 58   Temp 98.1 F (36.7 C) (Oral)   Resp 16   Ht 5\' 4"  (1.626 m)   Wt 170 lb (77.1 kg)   BMI 29.18  kg/m   Wt Readings from Last 3 Encounters:  12/29/17 170 lb (77.1 kg)  07/01/17 175 lb 6.4 oz (79.6 kg)  06/23/17 177 lb (80.3 kg)    Physical Exam  Constitutional: She is oriented to person, place, and time. She appears well-developed and well-nourished. No distress.  Well-appearing, comfortable, cooperative  HENT:  Head: Normocephalic and atraumatic.  Mouth/Throat: Oropharynx is clear and moist.  Eyes: Conjunctivae are normal. Right eye exhibits no discharge. Left eye exhibits no discharge.  Neck: Normal range of motion. Neck supple. No thyromegaly present.  Cardiovascular: Normal rate, regular rhythm, normal heart sounds and intact distal pulses.  No murmur heard. Pulmonary/Chest: Effort normal and breath sounds normal. No respiratory distress. She has no wheezes. She has no rales.  Musculoskeletal: Normal range of motion. She exhibits no edema.  Lymphadenopathy:    She has no cervical adenopathy.  Neurological: She is alert and oriented to person, place, and time.  Skin: Skin is warm and dry. No rash noted. She is not diaphoretic. No erythema.  Psychiatric: She has a normal mood and affect. Her behavior is normal.  Well groomed, good eye contact, normal speech and thoughts  Nursing note and vitals reviewed.    Assessment & Plan:   Problem List Items Addressed This Visit    Essential hypertension -  Primary    Controlled HTN No complications Failed Amlodipine 5mg  intolerance, off HCTZ    Plan:  1. Continue current BP meds - Amlodipine 2.5, Lisinopril 40mg  daily 2. Lifestyle Mods - continue try to improve regular exercise, low sodium diet 3. Contiue Monitor BP at home 4. Follow-up 6 months Annual Physical + labs      Hyperlipidemia    Weight loss in past 6 months Controlled cholesterol on statin and improving lifestyle Last lipid panel 05/2017 Calculated ASCVD 10 yr risk score, elevated based on HTN, age  Plan: 1. Continue current meds - Lovastatin 20mg  daily 2.  Future consider ASA 81mg  for primary ASCVD risk reduction 3. Encourage improved lifestyle - low carb/cholesterol, reduce portion size, continue improving regular exercise 4. Yearly lipids next 06/2018      Overweight (BMI 25.0-29.9)    Improved weight loss 6 lbs in 6 months, now BMI < 30 Diet and exercise improvements Encouraged to continue Follow-up         No orders of the defined types were placed in this encounter.   Follow up plan: Return in about 6 months (around 07/01/2018) for Annual Physical.  Future labs ordered for 06/29/18  Vanessa Reynolds, [facility] 12/29/2017, 10:20 AM

## 2017-12-29 NOTE — Assessment & Plan Note (Addendum)
Weight loss in past 6 months Controlled cholesterol on statin and improving lifestyle Last lipid panel 05/2017 Calculated ASCVD 10 yr risk score, elevated based on HTN, age  Plan: 1. Continue current meds - Lovastatin 20mg  daily 2. Future consider ASA 81mg  for primary ASCVD risk reduction 3. Encourage improved lifestyle - low carb/cholesterol, reduce portion size, continue improving regular exercise 4. Yearly lipids next 06/2018

## 2017-12-29 NOTE — Patient Instructions (Addendum)
Thank you for coming to the office today.  Keep up the good work!  Excellent weight loss down 6 lbs in 6 months.  Continue current medications. - You have refills to continue.  Call us to check status of High Dose Flu Vaccine in October 2019 - call to schedule a NURSE ONLY VISIT  DUE for FASTING BLOOD WORK (no food or drink after midnight before the lab appointment, only water or coffee without cream/sugar on the morning of)  SCHEDULE "Lab Only" visit in the morning at the clinic for lab draw in 6 MONTHS   - Make sure Lab Only appointment is at about 1 week before your next appointment, so that results will be available  For Lab Results, once available within 2-3 days of blood draw, you can can log in to MyChart online to view your results and a brief explanation. Also, we can discuss results at next follow-up visit.   Please schedule a Follow-up Appointment to: Return in about 6 months (around 07/01/2018) for Annual Physical.  If you have any other questions or concerns, please feel free to call the office or send a message through [place]. You may also schedule an earlier appointment if necessary.  Additionally, you may be receiving a survey about your experience at our office within a few days to 1 week by e-mail or mail. We value your feedback.  Nobie Putnam, DO [facility]

## 2017-12-29 NOTE — Assessment & Plan Note (Signed)
Controlled HTN No complications Failed Amlodipine 5mg  intolerance, off HCTZ    Plan:  1. Continue current BP meds - Amlodipine 2.5, Lisinopril 40mg  daily 2. Lifestyle Mods - continue try to improve regular exercise, low sodium diet 3. Contiue Monitor BP at home 4. Follow-up 6 months Annual Physical + labs

## 2017-12-29 NOTE — Assessment & Plan Note (Signed)
Improved weight loss 6 lbs in 6 months, now BMI < 30 Diet and exercise improvements Encouraged to continue Follow-up

## 2018-01-26 ENCOUNTER — Other Ambulatory Visit: Payer: Self-pay

## 2018-01-26 DIAGNOSIS — C20 Malignant neoplasm of rectum: Secondary | ICD-10-CM

## 2018-02-23 ENCOUNTER — Ambulatory Visit (INDEPENDENT_AMBULATORY_CARE_PROVIDER_SITE_OTHER): Payer: Medicare HMO

## 2018-02-23 ENCOUNTER — Other Ambulatory Visit
Admission: RE | Admit: 2018-02-23 | Discharge: 2018-02-23 | Disposition: A | Discharge status: Home / Self Care | Payer: Medicare HMO | Source: Ambulatory Visit | Attending: General Surgery | Admitting: General Surgery

## 2018-02-23 ENCOUNTER — Other Ambulatory Visit: Payer: Self-pay

## 2018-02-23 DIAGNOSIS — Z23 Encounter for immunization: Secondary | ICD-10-CM

## 2018-02-23 DIAGNOSIS — C20 Malignant neoplasm of rectum: Secondary | ICD-10-CM

## 2018-02-24 LAB — CEA: CEA: 2.5 ng/mL (ref 0.0–4.7)

## 2018-03-03 ENCOUNTER — Ambulatory Visit: Payer: Medicare HMO | Admitting: General Surgery

## 2018-03-03 ENCOUNTER — Ambulatory Visit: Payer: Self-pay | Admitting: General Surgery

## 2018-03-03 ENCOUNTER — Encounter: Payer: Self-pay | Admitting: General Surgery

## 2018-03-03 VITALS — BP 170/84 | HR 66 | Resp 14 | Ht 65.0 in | Wt 168.0 lb

## 2018-03-03 DIAGNOSIS — C20 Malignant neoplasm of rectum: Secondary | ICD-10-CM

## 2018-03-03 NOTE — Patient Instructions (Addendum)
Vanessa Reynolds will be calling her to make an appointment for lung scan .  Return in one year CEA pior to visit. The patient is aware to call back for any questions or concerns.

## 2018-03-03 NOTE — Progress Notes (Signed)
Patient ID: Vanessa Reynolds, female   DOB: 08/09/1945, 72 y.o.   MRN: [id number]  Chief Complaint  Patient presents with  . Follow-up    HPI Vanessa Reynolds is a 72 y.o. female here today for her follow up rectal cancer. Patient states she isw doing well. CEA done.  HPI  Past Medical History:  Diagnosis Date  . Abdominal pain, left lower quadrant 2012  . Breast screening, unspecified   . H/O cystitis 2011  . Nausea with vomiting   . Personal history of malignant neoplasm of large intestine 2013  . Personal history of tobacco use, presenting hazards to health   . 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    Past Surgical History:  Procedure Laterality Date  . APPENDECTOMY  December 2011  . BILATERAL OOPHORECTOMY  December 2011   Completed at time of low anterior resection.  . COLON SURGERY  03/14/10   lap assisted low anterior resection-adenocarcinoma of the rectum and a villous adenoma  . COLONOSCOPY  2011,2012,2015   Dr. Vira Agar, Dr. Addison Lank)    Family History  Problem Relation Age of Onset  . Hypertension Mother        deceased, age 73  . Cancer Father        gastric cancer, deceased, mid 55's  . Breast cancer Neg Hx     Social History Social History   Tobacco Use  . Smoking status: Former Smoker    Packs/day: 1.00    Years: 30.00    Pack years: 30.00    Types: Cigarettes    Last attempt to quit: 05/20/1997    Years since quitting: 20.8  . Smokeless tobacco: Former Systems developer  . Tobacco comment: quit in 1999  Substance Use Topics  . Alcohol use: No  . Drug use: No    Allergies  Allergen Reactions  . Fish Oil Other (See Comments)     Balance was off    Current Outpatient Medications  Medication Sig Dispense Refill  . amLODipine (NORVASC) 2.5 MG tablet TAKE 1 TABLET BY MOUTH ONCE DAILY 90 tablet 3  . lisinopril (PRINIVIL,ZESTRIL) 40 MG tablet TAKE 1 TABLET BY MOUTH ONCE DAILY 90 tablet 3  . loratadine  (CLARITIN) 10 MG tablet Take 1 tablet (10 mg total) by mouth daily. Use for 4-6 weeks then stop, and use as needed or seasonally 30 tablet 11  . lovastatin (MEVACOR) 20 MG tablet Take 1 tablet (20 mg total) by mouth at bedtime. 90 tablet 3  . Probiotic Product (PROBIOTIC DAILY PO) Take 1 tablet by mouth daily.    . vitamin B-12 (CYANOCOBALAMIN) 1000 MCG tablet Take 1,000 mcg by mouth daily.     No current facility-administered medications for this visit.     Review of Systems Review of Systems  Constitutional: Negative.   Respiratory: Negative.   Cardiovascular: Negative.     Blood pressure (!) 170/84, pulse 66, resp. rate 14, height 5\' 5"  (1.651 m), weight 168 lb (76.2 kg).  Physical Exam Physical Exam  Constitutional: She is oriented to person, place, and time. She appears well-developed and well-nourished.  Eyes: No scleral icterus.  Cardiovascular: Normal rate, regular rhythm and normal heart sounds.  Pulmonary/Chest: Effort normal and breath sounds normal.  Abdominal: Soft. Bowel sounds are normal. There is no hepatosplenomegaly.    Lymphadenopathy:    She has no cervical adenopathy.       Right: No inguinal adenopathy present.  Left: No inguinal and no supraclavicular adenopathy present.  Neurological: She is alert and oriented to person, place, and time.  Skin: Skin is warm and dry.     Data Reviewed CEA obtained on February 23, 2018 was 2.5.  Year prior 1.8.  Assessment    Doing well now 8 years post low anterior resection.  Potential candidate for screening chest CT.    Plan  Zack Seal, RN will be calling her to make an appointment for lung scan .  Return in one year CEA pior to visit. The patient is aware to call back for any questions or concerns.   HPI, Physical Exam, Assessment and Plan have been scribed under the direction and in the presence of Hervey Ard, MD.  Gaspar Cola, CMA  I have completed the exam and reviewed the above  documentation for accuracy and completeness.  I agree with the above.  Haematologist has been used and any errors in dictation or transcription are unintentional.  Hervey Ard, M.D., F.A.C.S.  Vanessa Reynolds 03/03/2018, 8:18 PM

## 2018-03-06 ENCOUNTER — Telehealth: Payer: Self-pay | Admitting: *Deleted

## 2018-03-06 NOTE — Telephone Encounter (Signed)
Received referral for low dose lung cancer screening CT scan.  Message left at phone number listed in EMR for patient to call either myself or Shawn Perkins back at [phone] to facilitate scheduling the scan.    

## 2018-03-09 ENCOUNTER — Telehealth: Payer: Self-pay | Admitting: *Deleted

## 2018-03-09 NOTE — Telephone Encounter (Signed)
Called patient r/t referral to ldct screening program.  Patient reports stopping smoking in 1999 or 2000.  Patient is ineligible for the scan r/t being quit >15 years.  Informed patient, voiced understanding.  MD notified.

## 2018-06-08 ENCOUNTER — Telehealth: Payer: Self-pay | Admitting: Family Medicine

## 2018-06-08 NOTE — Telephone Encounter (Signed)
Copied from [place]. Topic: Medicare AWV >> Jun 08, 2018 10:08 AM Sherren Kerns wrote: Called patient to reschedule her Annual Wellness Visit with the Nurse Health advisor on 07/07/2018 at 12:00 PM.  The Nurse Health Advisor's schedule has been adjusted and we need to RESCHEDULE after 02/182020 in another slot.  If any questions please contact: Janace Hoard at [phone] or Skype [email or web address]

## 2018-06-29 ENCOUNTER — Other Ambulatory Visit: Payer: Medicare Other

## 2018-06-29 DIAGNOSIS — I10 Essential (primary) hypertension: Secondary | ICD-10-CM

## 2018-06-29 DIAGNOSIS — E782 Mixed hyperlipidemia: Secondary | ICD-10-CM | POA: Diagnosis not present

## 2018-06-29 DIAGNOSIS — Z0000 Encounter for general adult medical examination without abnormal findings: Secondary | ICD-10-CM

## 2018-06-29 DIAGNOSIS — R799 Abnormal finding of blood chemistry, unspecified: Secondary | ICD-10-CM | POA: Diagnosis not present

## 2018-06-30 LAB — CBC WITH DIFFERENTIAL/PLATELET
Absolute Monocytes: 413 cells/uL (ref 200–950)
Basophils Absolute: 71 cells/uL (ref 0–200)
Basophils Relative: 1.4 %
Eosinophils Absolute: 281 cells/uL (ref 15–500)
Eosinophils Relative: 5.5 %
HCT: 38.9 % (ref 35.0–45.0)
Hemoglobin: 12.9 g/dL (ref 11.7–15.5)
Lymphs Abs: 1392 cells/uL (ref 850–3900)
MCH: 29.8 pg (ref 27.0–33.0)
MCHC: 33.2 g/dL (ref 32.0–36.0)
MCV: 89.8 fL (ref 80.0–100.0)
MPV: 13.6 fL — ABNORMAL HIGH (ref 7.5–12.5)
Monocytes Relative: 8.1 %
Neutro Abs: 2943 cells/uL (ref 1500–7800)
Neutrophils Relative %: 57.7 %
Platelets: 170 10*3/uL (ref 140–400)
RBC: 4.33 10*6/uL (ref 3.80–5.10)
RDW: 12 % (ref 11.0–15.0)
Total Lymphocyte: 27.3 %
WBC: 5.1 10*3/uL (ref 3.8–10.8)

## 2018-06-30 LAB — COMPLETE METABOLIC PANEL WITH GFR
AG Ratio: 1.1 (calc) (ref 1.0–2.5)
ALT: 24 U/L (ref 6–29)
AST: 32 U/L (ref 10–35)
Albumin: 3.9 g/dL (ref 3.6–5.1)
Alkaline phosphatase (APISO): 195 U/L — ABNORMAL HIGH (ref 37–153)
BUN: 13 mg/dL (ref 7–25)
CO2: 27 mmol/L (ref 20–32)
Calcium: 9.4 mg/dL (ref 8.6–10.4)
Chloride: 106 mmol/L (ref 98–110)
Creat: 0.72 mg/dL (ref 0.60–0.93)
GFR, Est African American: 96 mL/min/{1.73_m2} (ref >=60)
GFR, Est Non African American: 83 mL/min/{1.73_m2} (ref >=60)
Globulin: 3.4 g/dL (calc) (ref 1.9–3.7)
Glucose, Bld: 87 mg/dL (ref 65–99)
Potassium: 4.4 mmol/L (ref 3.5–5.3)
Sodium: 140 mmol/L (ref 135–146)
Total Bilirubin: 0.5 mg/dL (ref 0.2–1.2)
Total Protein: 7.3 g/dL (ref 6.1–8.1)

## 2018-06-30 LAB — HEMOGLOBIN A1C
Hgb A1c MFr Bld: 5.4 % of total Hgb (ref <5.7)
Mean Plasma Glucose: 108 (calc)
eAG (mmol/L): 6 (calc)

## 2018-06-30 LAB — LIPID PANEL
Cholesterol: 179 mg/dL (ref <200)
HDL: 66 mg/dL (ref >=50)
LDL Cholesterol (Calc): 99 mg/dL (calc)
Non-HDL Cholesterol (Calc): 113 mg/dL (calc) (ref <130)
Total CHOL/HDL Ratio: 2.7 (calc) (ref <5.0)
Triglycerides: 55 mg/dL (ref <150)

## 2018-07-06 ENCOUNTER — Ambulatory Visit (INDEPENDENT_AMBULATORY_CARE_PROVIDER_SITE_OTHER): Payer: Medicare Other | Admitting: Family Medicine

## 2018-07-06 ENCOUNTER — Encounter: Payer: Self-pay | Admitting: Family Medicine

## 2018-07-06 ENCOUNTER — Other Ambulatory Visit: Payer: Self-pay

## 2018-07-06 ENCOUNTER — Other Ambulatory Visit: Payer: Self-pay | Admitting: Family Medicine

## 2018-07-06 VITALS — BP 133/66 | HR 63 | Temp 98.4°F | Resp 16 | Ht 65.0 in | Wt 167.0 lb

## 2018-07-06 DIAGNOSIS — E782 Mixed hyperlipidemia: Secondary | ICD-10-CM

## 2018-07-06 DIAGNOSIS — Z85048 Personal history of other malignant neoplasm of rectum, rectosigmoid junction, and anus: Secondary | ICD-10-CM | POA: Diagnosis not present

## 2018-07-06 DIAGNOSIS — I10 Essential (primary) hypertension: Secondary | ICD-10-CM

## 2018-07-06 DIAGNOSIS — R7309 Other abnormal glucose: Secondary | ICD-10-CM

## 2018-07-06 DIAGNOSIS — Z0000 Encounter for general adult medical examination without abnormal findings: Secondary | ICD-10-CM

## 2018-07-06 DIAGNOSIS — E663 Overweight: Secondary | ICD-10-CM

## 2018-07-06 DIAGNOSIS — M85832 Other specified disorders of bone density and structure, left forearm: Secondary | ICD-10-CM

## 2018-07-06 DIAGNOSIS — Z1239 Encounter for other screening for malignant neoplasm of breast: Secondary | ICD-10-CM

## 2018-07-06 NOTE — Assessment & Plan Note (Signed)
Controlled HTN No complications Failed Amlodipine 5mg  intolerance, off HCTZ    Plan:  1. Continue current BP meds - Amlodipine 2.5, Lisinopril 40mg  daily 2. Lifestyle Mods - continue try to improve regular exercise, low sodium diet 3. Contiue Monitor BP at home 4. Follow-up 12 months Annual Physical + labs

## 2018-07-06 NOTE — Assessment & Plan Note (Signed)
Weight down 1-2 lbs Encourage keep improving diet / exercise lifestyle

## 2018-07-06 NOTE — Patient Instructions (Addendum)
Thank you for coming to the office today.  For DEXA Scan (Bone mineral density) screening for osteoporosis  You do have some thin bones, mild problem.   Recommend taking Vitamin D3 1,000 to 2,000 a day, and possibly combined with a calcium up to 800 to 1000  Call the [facility] below anytime to schedule your own appointment now that order has been placed.  [facility] [place], [place] [address] Phone: [phone]  Ordered Mammogram as well - you can call and schedule BOTH when ready - Summer 2020  1. Chemistry - Mildly elevated Alk phos similar to prior >150+. Otherwise normal kidney, liver, electrolytes, fasting sugar.  2. Hemoglobin A1c (Diabetes screening) - 5.4, normal not in range of Pre-Diabetes (>5.7 to 6.4)   3. Cholesterol - Normal cholesterol results. Controlled on Lovastatin.  4. CBC Blood Counts - Normal, no anemia, other abnormality   Please schedule a Follow-up Appointment to: Return in about 1 year (around 07/07/2019) for Annual Physical.  If you have any other questions or concerns, please feel free to call the office or send a message through [place]. You may also schedule an earlier appointment if necessary.  Additionally, you may be receiving a survey about your experience at our office within a few days to 1 week by e-mail or mail. We value your feedback.  Nobie Putnam, DO [facility]

## 2018-07-06 NOTE — Assessment & Plan Note (Signed)
Stable without fracture or complication Prior DEXA 4237 showed osteopenia multiple sites worst L forearm Not on therapy or supplement vit d or Calcium  Encourage Ca rich diet, start supplement vitamin D3 1-2k daily ORDER DEXA repeat screen for Summer 2020 - with mammo

## 2018-07-06 NOTE — Assessment & Plan Note (Signed)
Controlled cholesterol on statin and improving lifestyle Last lipid panel 2/20 Calculated ASCVD 10 yr risk score, elevated based on HTN, age  Plan: 1. Continue current meds - Lovastatin 20mg  daily 2. Future consider ASA 81mg  for primary ASCVD risk reduction 3. Encourage improved lifestyle - low carb/cholesterol, reduce portion size, continue improving regular exercise 4. Yearly lipids next 06/2019

## 2018-07-06 NOTE — Progress Notes (Signed)
Subjective:    Patient ID: Vanessa Reynolds, female    DOB: 1946/04/18, 73 y.o.   MRN: [id number]  Vanessa Reynolds is a 73 y.o. female presenting on 07/06/2018 for Annual Exam   HPI   Here for Annual Physical and Lab Review.  CHRONIC HTN: - Today patient reportsno new concerns since last visit. Checking blood pressure at home without concerns. Normal readings Current Meds - Lisinopril 40mg  daily, Amlodipine 2.5mg  nightly Reports good compliance, took meds today. Tolerating well, w/o complaints.  HYPERLIPIDEMIA / Overweight BMI >27 - Reports no concerns. Last lipid panel 2/20, controlled with mostly normal results - Currently taking Lovastatin 20mg  daily at bedtime, tolerating well without side effects or myalgias Lifestyle - Weight down 1-2 lbs  - Diet: Improved now, has new dentures, not eating as much meat, eating more veggies, drinking mostly water. - Exercise: No regular exercise yet, she works at [employer], on her feet a lot  History of Rectal Cancer, s/p surgery Following Gen Surgery Dr Bary Castilla, prior colon surgery in 02/2010, resection of adenocarcinoma of rectum. She did not receive chemotherapy. Has not had concerns about recurrence, monitored CEA in past. She has scheduled upcoming colonoscopy per Gen Surg  Additionally early sinusitis symptoms, she took OTC and benadryl with improvement, now seems improved.  OSTEOPENIA Last DEXA 2013, T score -1.5 to -1.8,  Left forearm, no prior fracture. She never took medicine for it, and she was unaware of this past diagnosis. Ready to get another DEXA. She does not take in much calcium or vitamin D supplement.  Health Maintenance:  Ordered DEXA  UTD Mammo 11/2017 - she is requesting order for next - due in 11/2018 - at [facility].  UTD Colonoscopy - 04/2014, she follows with Dr Bary Castilla for history of s/p rectal cancer - she will be due for repeat colonoscopy within 1-2 year.  UTD Vaccinations PNA, Flu, TDap   Depression  screen [facility] 2/9 07/06/2018 12/29/2017 07/01/2017  Decreased Interest 0 0 0  Down, Depressed, Hopeless 0 0 0  PHQ - 2 Score 0 0 0    Past Medical History:  Diagnosis Date  . Abdominal pain, left lower quadrant 2012  . Breast screening, unspecified   . H/O cystitis 2011  . Nausea with vomiting   . Personal history of malignant neoplasm of large intestine 2013  . Personal history of tobacco use, presenting hazards to health   . Rectal cancer ([place]) 04/2010   Adenocarcinoma arising in a villous adenoma, PT 1, N0.   Past Surgical History:  Procedure Laterality Date  . APPENDECTOMY  December 2011  . BILATERAL OOPHORECTOMY  December 2011   Completed at time of low anterior resection.  . COLON SURGERY  03/14/10   lap assisted low anterior resection-adenocarcinoma of the rectum and a villous adenoma  . COLONOSCOPY  2011,2012,2015   Dr. Vira Agar, Dr. Addison Lank)   Social History   Socioeconomic History  . Marital status: Married    Spouse name: Not on file  . Number of children: Not on file  . Years of education: Not on file  . Highest education level: Not on file  Occupational History  . Not on file  Social Needs  . Financial resource strain: Not hard at all  . Food insecurity:    Worry: Never true    Inability: Never true  . Transportation needs:    Medical: No    Non-medical: No  Tobacco Use  . Smoking status: Former Smoker  Packs/day: 1.00    Years: 30.00    Pack years: 30.00    Types: Cigarettes    Last attempt to quit: 05/20/1997    Years since quitting: 21.1  . Smokeless tobacco: Former Systems developer  . Tobacco comment: quit in 1999  Substance and Sexual Activity  . Alcohol use: No  . Drug use: No  . Sexual activity: Not on file  Lifestyle  . Physical activity:    Days per week: 0 days    Minutes per session: 0 min  . Stress: Not at all  Relationships  . Social connections:    Talks on phone: More than three times a week    Gets together: More than three times a  week    Attends religious service: Never    Active member of club or organization: No    Attends meetings of clubs or organizations: Never    Relationship status: Married  . Intimate partner violence:    Fear of current or ex partner: No    Emotionally abused: No    Physically abused: No    Forced sexual activity: No  Other Topics Concern  . Not on file  Social History Narrative  . Not on file   Family History  Problem Relation Age of Onset  . Hypertension Mother        deceased, age 12  . Cancer Father        gastric cancer, deceased, mid 57's  . Breast cancer Neg Hx    Current Outpatient Medications on File Prior to Visit  Medication Sig  . amLODipine (NORVASC) 2.5 MG tablet TAKE 1 TABLET BY MOUTH ONCE DAILY  . lisinopril (PRINIVIL,ZESTRIL) 40 MG tablet TAKE 1 TABLET BY MOUTH ONCE DAILY  . loratadine (CLARITIN) 10 MG tablet Take 1 tablet (10 mg total) by mouth daily. Use for 4-6 weeks then stop, and use as needed or seasonally  . lovastatin (MEVACOR) 20 MG tablet Take 1 tablet (20 mg total) by mouth at bedtime.  . Probiotic Product (PROBIOTIC DAILY PO) Take 1 tablet by mouth daily.  . vitamin B-12 (CYANOCOBALAMIN) 1000 MCG tablet Take 1,000 mcg by mouth daily.   No current facility-administered medications on file prior to visit.     Review of Systems  Constitutional: Negative for activity change, appetite change, chills, diaphoresis, fatigue and fever.  HENT: Positive for postnasal drip. Negative for congestion and hearing loss.   Eyes: Negative for visual disturbance.  Respiratory: Negative for apnea, cough, choking, chest tightness, shortness of breath and wheezing.   Cardiovascular: Negative for chest pain, palpitations and leg swelling.  Gastrointestinal: Negative for abdominal pain, anal bleeding, blood in stool, constipation, diarrhea, nausea and vomiting.  Endocrine: Negative for cold intolerance.  Genitourinary: Negative for difficulty urinating, dysuria,  frequency and hematuria.  Musculoskeletal: Negative for arthralgias, back pain and neck pain.  Skin: Negative for rash.  Allergic/Immunologic: Negative for environmental allergies.  Neurological: Negative for dizziness, weakness, light-headedness, numbness and headaches.  Hematological: Negative for adenopathy.  Psychiatric/Behavioral: Negative for behavioral problems, dysphoric mood and sleep disturbance. The patient is not nervous/anxious.    Per HPI unless specifically indicated above     Objective:    BP 133/66   Pulse 63   Temp 98.4 F (36.9 C) (Oral)   Resp 16   Ht 5\' 5"  (1.651 m)   Wt 167 lb (75.8 kg)   BMI 27.79 kg/m   Wt Readings from Last 3 Encounters:  07/06/18 167 lb (75.8 kg)  03/03/18 168 lb (76.2 kg)  12/29/17 170 lb (77.1 kg)    Physical Exam Vitals signs and nursing note reviewed.  Constitutional:      General: She is not in acute distress.    Appearance: She is well-developed. She is not diaphoretic.     Comments: Well-appearing, comfortable, cooperative  HENT:     Head: Normocephalic and atraumatic.     Comments: Frontal / maxillary sinuses non-tender. Nares patent without purulence or edema. Bilateral TMs clear without erythema, effusion or bulging. Oropharynx clear without erythema, exudates, edema or asymmetry.  Top denture in place. Eyes:     General:        Right eye: No discharge.        Left eye: No discharge.     Conjunctiva/sclera: Conjunctivae normal.     Pupils: Pupils are equal, round, and reactive to light.  Neck:     Musculoskeletal: Normal range of motion and neck supple.     Thyroid: No thyromegaly.  Cardiovascular:     Rate and Rhythm: Normal rate and regular rhythm.     Heart sounds: Normal heart sounds. No murmur.  Pulmonary:     Effort: Pulmonary effort is normal. No respiratory distress.     Breath sounds: Normal breath sounds. No wheezing or rales.  Abdominal:     General: Bowel sounds are normal. There is no distension.       Palpations: Abdomen is soft. There is no mass.     Tenderness: There is no abdominal tenderness.  Musculoskeletal: Normal range of motion.        General: No tenderness.     Comments: Upper / Lower Extremities: - Normal muscle tone, strength bilateral upper extremities 5/5, lower extremities 5/5  Lymphadenopathy:     Cervical: No cervical adenopathy.  Skin:    General: Skin is warm and dry.     Findings: No erythema or rash.  Neurological:     Mental Status: She is alert and oriented to person, place, and time.     Comments: Distal sensation intact to light touch all extremities  Psychiatric:        Behavior: Behavior normal.     Comments: Well groomed, good eye contact, normal speech and thoughts    Results for orders placed or performed in visit on 06/29/18  Lipid panel  Result Value Ref Range   Cholesterol 179 <200 mg/dL   HDL 66 > OR = 50 mg/dL   Triglycerides 55 <150 mg/dL   LDL Cholesterol (Calc) 99 mg/dL (calc)   Total CHOL/HDL Ratio 2.7 <5.0 (calc)   Non-HDL Cholesterol (Calc) 113 <130 mg/dL (calc)  COMPLETE METABOLIC PANEL WITH GFR  Result Value Ref Range   Glucose, Bld 87 65 - 99 mg/dL   BUN 13 7 - 25 mg/dL   Creat 0.72 0.60 - 0.93 mg/dL   GFR, Est Non African American 83 > OR = 60 mL/min/1.7m2   GFR, Est African American 96 > OR = 60 mL/min/1.43m2   BUN/Creatinine Ratio NOT APPLICABLE 6 - 22 (calc)   Sodium 140 135 - 146 mmol/L   Potassium 4.4 3.5 - 5.3 mmol/L   Chloride 106 98 - 110 mmol/L   CO2 27 20 - 32 mmol/L   Calcium 9.4 8.6 - 10.4 mg/dL   Total Protein 7.3 6.1 - 8.1 g/dL   Albumin 3.9 3.6 - 5.1 g/dL   Globulin 3.4 1.9 - 3.7 g/dL (calc)   AG Ratio 1.1 1.0 - 2.5 (calc)  Total Bilirubin 0.5 0.2 - 1.2 mg/dL   Alkaline phosphatase (APISO) 195 (H) 37 - 153 U/L   AST 32 10 - 35 U/L   ALT 24 6 - 29 U/L  CBC with Differential/Platelet  Result Value Ref Range   WBC 5.1 3.8 - 10.8 Thousand/uL   RBC 4.33 3.80 - 5.10 Million/uL   Hemoglobin 12.9 11.7  - 15.5 g/dL   HCT 38.9 35.0 - 45.0 %   MCV 89.8 80.0 - 100.0 fL   MCH 29.8 27.0 - 33.0 pg   MCHC 33.2 32.0 - 36.0 g/dL   RDW 12.0 11.0 - 15.0 %   Platelets 170 140 - 400 Thousand/uL   MPV 13.6 (H) 7.5 - 12.5 fL   Neutro Abs 2,943 1,500 - 7,800 cells/uL   Lymphs Abs 1,392 850 - 3,900 cells/uL   Absolute Monocytes 413 200 - 950 cells/uL   Eosinophils Absolute 281 15 - 500 cells/uL   Basophils Absolute 71 0 - 200 cells/uL   Neutrophils Relative % 57.7 %   Total Lymphocyte 27.3 %   Monocytes Relative 8.1 %   Eosinophils Relative 5.5 %   Basophils Relative 1.4 %  Hemoglobin A1c  Result Value Ref Range   Hgb A1c MFr Bld 5.4 <5.7 % of total Hgb   Mean Plasma Glucose 108 (calc)   eAG (mmol/L) 6.0 (calc)      Assessment & Plan:   Problem List Items Addressed This Visit    Essential hypertension    Controlled HTN No complications Failed Amlodipine 5mg  intolerance, off HCTZ    Plan:  1. Continue current BP meds - Amlodipine 2.5, Lisinopril 40mg  daily 2. Lifestyle Mods - continue try to improve regular exercise, low sodium diet 3. Contiue Monitor BP at home 4. Follow-up 12 months Annual Physical + labs      History of rectal cancer    Stable without evidence or history of recurrence Followed by Gen Surgery - Dr Bary Castilla - CEA monitoring Anticipate further colonoscopy screening 1-2 years, last 2015      Hyperlipidemia    Controlled cholesterol on statin and improving lifestyle Last lipid panel 2/20 Calculated ASCVD 10 yr risk score, elevated based on HTN, age  Plan: 1. Continue current meds - Lovastatin 20mg  daily 2. Future consider ASA 81mg  for primary ASCVD risk reduction 3. Encourage improved lifestyle - low carb/cholesterol, reduce portion size, continue improving regular exercise 4. Yearly lipids next 06/2019      Osteopenia of left forearm    Stable without fracture or complication Prior DEXA 6789 showed osteopenia multiple sites worst L forearm Not on therapy or  supplement vit d or Calcium  Encourage Ca rich diet, start supplement vitamin D3 1-2k daily ORDER DEXA repeat screen for Summer 2020 - with mammo      Relevant Orders   DG Bone Density   Overweight (BMI 25.0-29.9)    Weight down 1-2 lbs Encourage keep improving diet / exercise lifestyle       Other Visit Diagnoses    Annual physical exam    -  Primary   Screening for breast cancer       Relevant Orders   MM DIGITAL SCREENING BILATERAL      Updated Health Maintenance information - Ordered DEXA / Mammogram, patient to schedule Summer 2020 Reviewed recent lab results with patient Encouraged improvement to lifestyle with diet and exercise Maintain weight  No orders of the defined types were placed in this encounter.   Follow up plan:  Return in about 1 year (around 07/07/2019) for Annual Physical.  Future labs ordered for 07/05/19  Nobie Putnam, [facility] 07/06/2018, 9:12 AM

## 2018-07-06 NOTE — Assessment & Plan Note (Signed)
Stable without evidence or history of recurrence Followed by Gen Surgery - Dr Bary Castilla - CEA monitoring Anticipate further colonoscopy screening 1-2 years, last 2015

## 2018-07-07 ENCOUNTER — Ambulatory Visit: Payer: Self-pay

## 2018-07-14 ENCOUNTER — Ambulatory Visit (INDEPENDENT_AMBULATORY_CARE_PROVIDER_SITE_OTHER): Payer: Medicare Other

## 2018-07-14 VITALS — BP 134/76 | HR 64 | Temp 98.6°F | Resp 16 | Ht 65.0 in | Wt 167.2 lb

## 2018-07-14 DIAGNOSIS — Z0000 Encounter for general adult medical examination without abnormal findings: Secondary | ICD-10-CM | POA: Diagnosis not present

## 2018-07-14 NOTE — Progress Notes (Signed)
Subjective:   Vanessa Reynolds is a 73 y.o. female who presents for Medicare Annual (Subsequent) preventive examination.  Review of Systems:   Cardiac Risk Factors include: advanced age (>3men, >49 women);hypertension     Objective:     Vitals: BP 134/76 (BP Location: Right Arm, Patient Position: Sitting, Cuff Size: Normal)   Pulse 64   Temp 98.6 F (37 C) (Oral)   Resp 16   Ht 5\' 5"  (1.651 m)   Wt 167 lb 3.2 oz (75.8 kg)   BMI 27.82 kg/m   Body mass index is 27.82 kg/m.  Advanced Directives 07/14/2018 07/01/2017  Does Patient Have a Medical Advance Directive? No No  Would patient like information on creating a medical advance directive? Yes (MAU/Ambulatory/Procedural Areas - Information given) Yes (MAU/Ambulatory/Procedural Areas - Information given)    Tobacco Social History   Tobacco Use  Smoking Status Former Smoker  . Packs/day: 1.00  . Years: 30.00  . Pack years: 30.00  . Types: Cigarettes  . Last attempt to quit: 05/20/1997  . Years since quitting: 21.1  Smokeless Tobacco Never Used  Tobacco Comment   quit in 1999     Counseling given: Not Answered Comment: quit in 1999   Clinical Intake:  Pre-visit preparation completed: Yes  Pain : No/denies pain     Nutritional Status: BMI 25 -29 Overweight Nutritional Risks: Nausea/ vomitting/ diarrhea(nausea today ) Diabetes: No  How often do you need to have someone help you when you read instructions, pamphlets, or other written materials from your doctor or pharmacy?: 1 - Never What is the last grade level you completed in school?: 10th grade  Interpreter Needed?: No  Information entered by :: Maylani Embree,LPN   Past Medical History:  Diagnosis Date  . Abdominal pain, left lower quadrant 2012  . Breast screening, unspecified   . H/O cystitis 2011  . Hypertension   . Nausea with vomiting   . Personal history of malignant neoplasm of large intestine 2013  . Personal history of tobacco use,  presenting hazards to health   . Rectal cancer ([place]) 04/2010   Adenocarcinoma arising in a villous adenoma, PT 1, N0.   Past Surgical History:  Procedure Laterality Date  . APPENDECTOMY  December 2011  . BILATERAL OOPHORECTOMY  December 2011   Completed at time of low anterior resection.  . COLON SURGERY  03/14/10   lap assisted low anterior resection-adenocarcinoma of the rectum and a villous adenoma  . COLONOSCOPY  2011,2012,2015   Dr. Vira Agar, Dr. Addison Lank)   Family History  Problem Relation Age of Onset  . Hypertension Mother        deceased, age 55  . Cancer Father        gastric cancer, deceased, mid 58's  . Breast cancer Neg Hx    Social History   Socioeconomic History  . Marital status: Married    Spouse name: Not on file  . Number of children: Not on file  . Years of education: Not on file  . Highest education level: 10th grade  Occupational History    Comment: fulltime   Social Needs  . Financial resource strain: Not hard at all  . Food insecurity:    Worry: Never true    Inability: Never true  . Transportation needs:    Medical: No    Non-medical: No  Tobacco Use  . Smoking status: Former Smoker    Packs/day: 1.00    Years: 30.00    Pack years:  30.00    Types: Cigarettes    Last attempt to quit: 05/20/1997    Years since quitting: 21.1  . Smokeless tobacco: Never Used  . Tobacco comment: quit in 1999  Substance and Sexual Activity  . Alcohol use: No  . Drug use: No  . Sexual activity: Not on file  Lifestyle  . Physical activity:    Days per week: 0 days    Minutes per session: 0 min  . Stress: Not at all  Relationships  . Social connections:    Talks on phone: More than three times a week    Gets together: More than three times a week    Attends religious service: Never    Active member of club or organization: No    Attends meetings of clubs or organizations: Never    Relationship status: Married  Other Topics Concern  . Not on file    Social History Narrative  . Not on file    Outpatient Encounter Medications as of 07/14/2018  Medication Sig  . amLODipine (NORVASC) 2.5 MG tablet TAKE 1 TABLET BY MOUTH ONCE DAILY  . cholecalciferol (VITAMIN D3) 25 MCG (1000 UT) tablet Take 1,000 Units by mouth daily.  Marland Kitchen lisinopril (PRINIVIL,ZESTRIL) 40 MG tablet TAKE 1 TABLET BY MOUTH ONCE DAILY  . loratadine (CLARITIN) 10 MG tablet Take 1 tablet (10 mg total) by mouth daily. Use for 4-6 weeks then stop, and use as needed or seasonally  . lovastatin (MEVACOR) 20 MG tablet Take 1 tablet (20 mg total) by mouth at bedtime.  . Probiotic Product (PROBIOTIC DAILY PO) Take 1 tablet by mouth daily.  . vitamin B-12 (CYANOCOBALAMIN) 1000 MCG tablet Take 1,000 mcg by mouth daily.   No facility-administered encounter medications on file as of 07/14/2018.     Activities of Daily Living In your present state of health, do you have any difficulty performing the following activities: 07/14/2018  Hearing? N  Comment right ear - no hearing aids   Vision? N  Difficulty concentrating or making decisions? N  Walking or climbing stairs? N  Dressing or bathing? N  Doing errands, shopping? N  Preparing Food and eating ? N  Using the Toilet? N  In the past six months, have you accidently leaked urine? Y  Comment pads for protection  Do you have problems with loss of bowel control? N  Managing your Medications? N  Managing your Finances? N  Housekeeping or managing your Housekeeping? N  Some recent data might be hidden    Patient Care Team: Olin Hauser, DO as PCP - General (Family Medicine) Bary Castilla Forest Gleason, MD (General Surgery)    Assessment:   This is a routine wellness examination for Keah.  Exercise Activities and Dietary recommendations Current Exercise Habits: The patient does not participate in regular exercise at present, Exercise limited by: None identified  Goals    . DIET - INCREASE WATER INTAKE     Recommend  drinking at least 6-8 glasses of water a day        Fall Risk Fall Risk  07/14/2018 07/06/2018 12/29/2017 07/01/2017 06/23/2017  Falls in the past year? 0 0 No No No  Follow up - Falls evaluation completed - - -   FALL RISK PREVENTION PERTAINING TO THE HOME:  Any stairs in or around the home? No  If so, are there any without handrails? No   Home free of loose throw rugs in walkways, pet beds, electrical cords, etc? Yes  Adequate  lighting in your home to reduce risk of falls? Yes   ASSISTIVE DEVICES UTILIZED TO PREVENT FALLS:  Life alert? No  Use of a cane, walker or w/c? No  Grab bars in the bathroom? Yes  Shower chair or bench in shower? No  Elevated toilet seat or a handicapped toilet? No   DME ORDERS:  DME order needed?  No   TIMED UP AND GO:  Was the test performed? Yes .  Length of time to ambulate 10 feet: 11 sec.   GAIT:  Appearance of gait: Gait stead-fast without the use of an assistive device.  Education: Fall risk prevention has been discussed.  Intervention(s) required? No    Depression Screen PHQ 2/9 Scores 07/14/2018 07/06/2018 12/29/2017 07/01/2017  PHQ - 2 Score 0 0 0 0     Cognitive Function     6CIT Screen 07/14/2018 07/01/2017  What Year? 0 points 0 points  What month? 0 points 0 points  What time? 0 points 0 points  Count back from 20 0 points 0 points  Months in reverse 0 points 0 points  Repeat phrase 0 points 0 points  Total Score 0 0    Immunization History  Administered Date(s) Administered  . Influenza, High Dose Seasonal PF 02/20/2015, 03/04/2016, 02/24/2017, 02/23/2018  . Pneumococcal Conjugate-13 12/27/2013  . Pneumococcal Polysaccharide-23 05/20/2009    Qualifies for Shingles Vaccine? Yes  Due for Shingrix. Education has been provided regarding the importance of this vaccine. Pt has been advised to call insurance company to determine out of pocket expense. Advised may also receive vaccine at local pharmacy or Health Dept. Verbalized  acceptance and understanding.  Tdap: up to date   Flu Vaccine: up to date   Pneumococcal Vaccine: up to date   Screening Tests Health Maintenance  Topic Date Due  . MAMMOGRAM  12/02/2019  . COLONOSCOPY  04/20/2024  . TETANUS/TDAP  03/26/2025  . INFLUENZA VACCINE  Completed  . DEXA SCAN  Completed  . Hepatitis C Screening  Completed  . PNA vac Low Risk Adult  Completed   Cancer Screenings:  Colorectal Screening: Completed 04/20/2014. Repeat every 10 years  Mammogram: Completed 12/01/2017. Repeat every year, ordered 07/06/2018 for summer 2020  Bone Density: Completed 04/20/2014. Ordered 07/06/2018 for summer 2020  Lung Cancer Screening: (Low Dose CT Chest recommended if Age 59-80 years, 30 pack-year currently smoking OR have quit w/in 15years.) does not qualify.    Additional Screening:  Hepatitis C Screening: does qualify; Comp 06/23/17  Vision Screening: Recommended annual ophthalmology exams for early detection of glaucoma and other disorders of the eye. Is the patient up to date with their annual eye exam?  Yes  Who is the provider or what is the name of the office in which the pt attends annual eye exams? [facility]    Dental Screening: Recommended annual dental exams for proper oral hygiene  Community Resource Referral:  CRR required this visit?  No      Plan:    I have personally reviewed and addressed the Medicare Annual Wellness questionnaire and have noted the following in the patient's chart:  A. Medical and social history B. Use of alcohol, tobacco or illicit drugs  C. Current medications and supplements D. Functional ability and status E.  Nutritional status F.  Physical activity G. Advance directives H. List of other physicians I.  Hospitalizations, surgeries, and ER visits in previous 12 months J.  [place] such as hearing and vision if needed, cognitive  and depression L. Referrals and appointments   In addition, I have reviewed and  discussed with patient certain preventive protocols, quality metrics, and best practice recommendations. A written personalized care plan for preventive services as well as general preventive health recommendations were provided to patient.   Signed,  Tyler Aas, LPN Nurse Health Advisor   Nurse Notes:none

## 2018-07-14 NOTE — Patient Instructions (Addendum)
[name] , Thank you for taking time to come for your Medicare Wellness Visit. I appreciate your ongoing commitment to your health goals. Please review the following plan we discussed and let me know if I can assist you in the future.   Screening recommendations/referrals: Colonoscopy: completed 04/20/2014 Mammogram: completed 12/01/2017, call to schedule for after July Please call [phone] to schedule your mammogram.   Bone Density: completed 04/20/2014, call to schedule for after July  Recommended yearly ophthalmology/optometry visit for glaucoma screening and checkup Recommended yearly dental visit for hygiene and checkup  Vaccinations: Influenza vaccine: up to date Pneumococcal vaccine: up to date Tdap vaccine: up to date Shingles vaccine: shingrix eligible check with your insurance company for coverage     Advanced directives: Advance directive discussed with you today. I have provided a copy for you to complete at home and have notarized. Once this is complete please bring a copy in to our office so we can scan it into your chart.  Conditions/risks identified: continue the good work with water intake.   Next appointment: Follow up in one year for your annual wellness exam.    Preventive Care 65 Years and Older, Female Preventive care refers to lifestyle choices and visits with your health care provider that can promote health and wellness. What does preventive care include?  A yearly physical exam. This is also called an annual well check.  Dental exams once or twice a year.  Routine eye exams. Ask your health care provider how often you should have your eyes checked.  Personal lifestyle choices, including:  Daily care of your teeth and gums.  Regular physical activity.  Eating a healthy diet.  Avoiding tobacco and drug use.  Limiting alcohol use.  Practicing safe sex.  Taking low-dose aspirin every day.  Taking vitamin and mineral supplements as  recommended by your health care provider. What happens during an annual well check? The services and screenings done by your health care provider during your annual well check will depend on your age, overall health, lifestyle risk factors, and family history of disease. Counseling  Your health care provider may ask you questions about your:  Alcohol use.  Tobacco use.  Drug use.  Emotional well-being.  Home and relationship well-being.  Sexual activity.  Eating habits.  History of falls.  Memory and ability to understand (cognition).  Work and work Statistician.  Reproductive health. Screening  You may have the following tests or measurements:  Height, weight, and BMI.  Blood pressure.  Lipid and cholesterol levels. These may be checked every 5 years, or more frequently if you are over 20 years old.  Skin check.  Lung cancer screening. You may have this screening every year starting at age 64 if you have a 30-pack-year history of smoking and currently smoke or have quit within the past 15 years.  Fecal occult blood test (FOBT) of the stool. You may have this test every year starting at age 46.  Flexible sigmoidoscopy or colonoscopy. You may have a sigmoidoscopy every 5 years or a colonoscopy every 10 years starting at age 70.  Hepatitis C blood test.  Hepatitis B blood test.  Sexually transmitted disease (STD) testing.  Diabetes screening. This is done by checking your blood sugar (glucose) after you have not eaten for a while (fasting). You may have this done every 1-3 years.  Bone density scan. This is done to screen for osteoporosis. You may have this done starting at age 62.  Mammogram.  This may be done every 1-2 years. Talk to your health care provider about how often you should have regular mammograms. Talk with your health care provider about your test results, treatment options, and if necessary, the need for more tests. Vaccines  Your health care  provider may recommend certain vaccines, such as:  Influenza vaccine. This is recommended every year.  Tetanus, diphtheria, and acellular pertussis (Tdap, Td) vaccine. You may need a Td booster every 10 years.  Zoster vaccine. You may need this after age 70.  Pneumococcal 13-valent conjugate (PCV13) vaccine. One dose is recommended after age 52.  Pneumococcal polysaccharide (PPSV23) vaccine. One dose is recommended after age 17. Talk to your health care provider about which screenings and vaccines you need and how often you need them. This information is not intended to replace advice given to you by your health care provider. Make sure you discuss any questions you have with your health care provider. Document Released: 06/02/2015 Document Revised: 01/24/2016 Document Reviewed: 03/07/2015 Elsevier Interactive Patient Education  2017 [place] Prevention in the Home Falls can cause injuries. They can happen to people of all ages. There are many things you can do to make your home safe and to help prevent falls. What can I do on the outside of my home?  Regularly fix the edges of walkways and driveways and fix any cracks.  Remove anything that might make you trip as you walk through a door, such as a raised step or threshold.  Trim any bushes or trees on the path to your home.  Use bright outdoor lighting.  Clear any walking paths of anything that might make someone trip, such as rocks or tools.  Regularly check to see if handrails are loose or broken. Make sure that both sides of any steps have handrails.  Any raised decks and porches should have guardrails on the edges.  Have any leaves, snow, or ice cleared regularly.  Use sand or salt on walking paths during winter.  Clean up any spills in your garage right away. This includes oil or grease spills. What can I do in the bathroom?  Use night lights.  Install grab bars by the toilet and in the tub and shower. Do  not use towel bars as grab bars.  Use non-skid mats or decals in the tub or shower.  If you need to sit down in the shower, use a plastic, non-slip stool.  Keep the floor dry. Clean up any water that spills on the floor as soon as it happens.  Remove soap buildup in the tub or shower regularly.  Attach bath mats securely with double-sided non-slip rug tape.  Do not have throw rugs and other things on the floor that can make you trip. What can I do in the bedroom?  Use night lights.  Make sure that you have a light by your bed that is easy to reach.  Do not use any sheets or blankets that are too big for your bed. They should not hang down onto the floor.  Have a firm chair that has side arms. You can use this for support while you get dressed.  Do not have throw rugs and other things on the floor that can make you trip. What can I do in the kitchen?  Clean up any spills right away.  Avoid walking on wet floors.  Keep items that you use a lot in easy-to-reach places.  If you need to reach something  above you, use a strong step stool that has a grab bar.  Keep electrical cords out of the way.  Do not use floor polish or wax that makes floors slippery. If you must use wax, use non-skid floor wax.  Do not have throw rugs and other things on the floor that can make you trip. What can I do with my stairs?  Do not leave any items on the stairs.  Make sure that there are handrails on both sides of the stairs and use them. Fix handrails that are broken or loose. Make sure that handrails are as long as the stairways.  Check any carpeting to make sure that it is firmly attached to the stairs. Fix any carpet that is loose or worn.  Avoid having throw rugs at the top or bottom of the stairs. If you do have throw rugs, attach them to the floor with carpet tape.  Make sure that you have a light switch at the top of the stairs and the bottom of the stairs. If you do not have them,  ask someone to add them for you. What else can I do to help prevent falls?  Wear shoes that:  Do not have high heels.  Have rubber bottoms.  Are comfortable and fit you well.  Are closed at the toe. Do not wear sandals.  If you use a stepladder:  Make sure that it is fully opened. Do not climb a closed stepladder.  Make sure that both sides of the stepladder are locked into place.  Ask someone to hold it for you, if possible.  Clearly mark and make sure that you can see:  Any grab bars or handrails.  First and last steps.  Where the edge of each step is.  Use tools that help you move around (mobility aids) if they are needed. These include:  Canes.  Walkers.  Scooters.  Crutches.  Turn on the lights when you go into a dark area. Replace any light bulbs as soon as they burn out.  Set up your furniture so you have a clear path. Avoid moving your furniture around.  If any of your floors are uneven, fix them.  If there are any pets around you, be aware of where they are.  Review your medicines with your doctor. Some medicines can make you feel dizzy. This can increase your chance of falling. Ask your doctor what other things that you can do to help prevent falls. This information is not intended to replace advice given to you by your health care provider. Make sure you discuss any questions you have with your health care provider. Document Released: 03/02/2009 Document Revised: 10/12/2015 Document Reviewed: 06/10/2014 Elsevier Interactive Patient Education  2017 [employer].

## 2018-08-03 ENCOUNTER — Other Ambulatory Visit: Payer: Self-pay | Admitting: Family Medicine

## 2018-08-03 DIAGNOSIS — E7849 Other hyperlipidemia: Secondary | ICD-10-CM

## 2018-08-06 ENCOUNTER — Other Ambulatory Visit: Payer: Self-pay | Admitting: Family Medicine

## 2018-08-06 DIAGNOSIS — I10 Essential (primary) hypertension: Secondary | ICD-10-CM

## 2018-09-23 ENCOUNTER — Other Ambulatory Visit: Payer: Self-pay

## 2018-09-23 ENCOUNTER — Encounter: Payer: Self-pay | Admitting: Family Medicine

## 2018-09-23 ENCOUNTER — Ambulatory Visit (INDEPENDENT_AMBULATORY_CARE_PROVIDER_SITE_OTHER): Payer: Medicare Other | Admitting: Family Medicine

## 2018-09-23 DIAGNOSIS — H6983 Other specified disorders of Eustachian tube, bilateral: Secondary | ICD-10-CM | POA: Diagnosis not present

## 2018-09-23 DIAGNOSIS — J3089 Other allergic rhinitis: Secondary | ICD-10-CM

## 2018-09-23 MED ORDER — LORATADINE 10 MG PO TABS: 10.0000 mg | ORAL_TABLET | Freq: Every day | ORAL | 5 refills | Status: DC

## 2018-09-23 MED ORDER — FLUTICASONE PROPIONATE 50 MCG/ACT NA SUSP: 2.0000 | Freq: Every day | NASAL | 3 refills | Status: DC

## 2018-09-23 NOTE — Progress Notes (Signed)
Virtual Visit via Telephone The purpose of this virtual visit is to provide medical care while limiting exposure to the novel coronavirus (COVID19) for both patient and office staff.  Consent was obtained for phone visit:  Yes.   Answered questions that patient had about telehealth interaction:  Yes.   I discussed the limitations, risks, security and privacy concerns of performing an evaluation and management service by telephone. I also discussed with the patient that there may be a patient responsible charge related to this service. The patient expressed understanding and agreed to proceed.  Patient Location: Home Provider Location: [facility])  ---------------------------------------------------------------------- Chief Complaint  Patient presents with  . Ear Fullness    pt state difficulty hearing out of both ears w/ fullness and popping sounds. The pt also complains of head congestion, and nasal drainage x 2 weeks. She denies pain in the ear.     S: Reviewed CMA documentation. I have called patient and gathered additional HPI as follows:  Bilateral Ear Fullness / Reduced Hearing / Sinusitis Reports that symptoms started 2 weeks ago with some allergy trigger outdoors, and started with sinus congestion and drainage, then gradually improved sinus congestion and now has persistent ear fullness with pressure and muffled or reduced hearing sounds, some popping. - Tried OTC Benadryl anti histamine as needed. Not currently using nose spray. - She has had prior similar history of allergy sinusitis and eustachian tube dysfunction, last time was on the R side.   Denies any high risk travel to areas of current concern for COVID19. Denies any known or suspected exposure to person with or possibly with COVID19.  Denies any fevers, chills, sweats, body ache, cough, shortness of breath, sinus pain, headache, abdominal pain,  diarrhea  -------------------------------------------------------------------------- O: No physical exam performed due to remote telephone encounter.  -------------------------------------------------------------------------- A&P:  Acute bilateral eustachian tube dysfunction with reduced hearing but no clinical history of sinusitis. 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 - Inadequate conservative therapy   Plan: 1. Start Flonase 2 sprays each nare daily for up to 4-6 weeks or longer 2. Start OTC oral anti-histamine daily - can stop benadryl OTC 3. Recommend may consider trial OTC oral decongestant for up to 1 week or less 4. AVOID Afrin nasal decongestant, counseled if uses, max dose up to 3 days or less, avoid rebound rhinitis 5. Additionally discussed potential benefit of oral steroid, prednisone taper for short course up to 20mg  daily x 3 days, only if refractory symptoms, counseled on potential side effects 6. Follow-up if not improved or worsening, return criteria  - Currently patient is LOW RISK for COVID19 based on current symptoms and no known travel/exposure - however at this time, now [place] is currently within phase of community spread, and therefore patient can still be potentially exposed. Cannot rule out case with mild symptoms. Testing is not recommended at this time due to mild symptoms.   Meds ordered this encounter  Medications  . loratadine (CLARITIN) 10 MG tablet    Sig: Take 1 tablet (10 mg total) by mouth daily. Use for 4-6 weeks then stop, and use as needed or seasonally    Dispense:  30 tablet    Refill:  5  . fluticasone (FLONASE) 50 MCG/ACT nasal spray    Sig: Place 2 sprays into both nostrils daily. Use for 4-6 weeks then stop and use seasonally or as needed.    Dispense:  16 g    Refill:  3  OPTIONAL RECOMMENDED self quarantine for patient safety for PREVENTION ONLY. It is not required based on current clinical symptoms. If they were  to develop fever or worsening shortness of breath, then emphasis on REQUIRED quarantine for up to 7-14 days that could be resolved if fever free >3 days AND if symptoms improving after 7 days.   If symptoms do not resolve or significantly improve OR if WORSENING - fever / cough - or worsening shortness of breath - then should contact us and seek advice on next steps in treatment at home vs where/when to seek care at Urgent Care or Hospital ED for further intervention and possible testing if indicated.  Patient verbalizes understanding with the above medical recommendations including the limitation of remote medical advice.  Specific follow-up / call-back criteria were given for patient to follow-up or seek medical care more urgently if needed.   - Time spent in direct consultation with patient on phone: 9 minutes   Nobie Putnam, [facility] 09/23/2018, 9:01 AM

## 2018-09-23 NOTE — Patient Instructions (Addendum)
AVS given by phone. No MyChart access.

## 2018-11-10 ENCOUNTER — Other Ambulatory Visit: Payer: Self-pay | Admitting: Family Medicine

## 2018-11-10 DIAGNOSIS — I10 Essential (primary) hypertension: Secondary | ICD-10-CM

## 2018-11-10 NOTE — Telephone Encounter (Signed)
Pt called requesting refill on   Amlodipine, lisinopril  Called into  [facility]

## 2018-11-11 MED ORDER — AMLODIPINE BESYLATE 2.5 MG PO TABS: 2.5000 mg | ORAL_TABLET | Freq: Every day | ORAL | 3 refills | Status: DC

## 2018-11-11 MED ORDER — LISINOPRIL 40 MG PO TABS: 40.0000 mg | ORAL_TABLET | Freq: Every day | ORAL | 3 refills | Status: DC

## 2018-12-08 ENCOUNTER — Encounter: Payer: Self-pay | Admitting: General Surgery

## 2018-12-22 ENCOUNTER — Ambulatory Visit: Payer: Medicare HMO

## 2019-01-14 ENCOUNTER — Other Ambulatory Visit: Payer: Self-pay

## 2019-01-14 DIAGNOSIS — C20 Malignant neoplasm of rectum: Secondary | ICD-10-CM

## 2019-02-25 ENCOUNTER — Ambulatory Visit: Payer: Self-pay | Admitting: General Surgery

## 2019-03-09 ENCOUNTER — Ambulatory Visit: Payer: Self-pay | Admitting: General Surgery

## 2019-05-17 ENCOUNTER — Ambulatory Visit
Admission: RE | Admit: 2019-05-17 | Discharge: 2019-05-17 | Disposition: A | Discharge status: Home / Self Care | Payer: Medicare Other | Source: Ambulatory Visit | Attending: Family Medicine | Admitting: Family Medicine

## 2019-05-17 DIAGNOSIS — Z1231 Encounter for screening mammogram for malignant neoplasm of breast: Secondary | ICD-10-CM | POA: Insufficient documentation

## 2019-05-17 DIAGNOSIS — Z1239 Encounter for other screening for malignant neoplasm of breast: Secondary | ICD-10-CM

## 2019-05-17 IMAGING — MG DIGITAL SCREENING BILAT W/ TOMO W/ CAD
8 series · 8 of 24 positions shown · non-contrast
Comparison: Previous exam(s).

CLINICAL DATA: Screening.

EXAM:
DIGITAL SCREENING BILATERAL MAMMOGRAM WITH TOMO AND CAD

[R CC synth-2D]
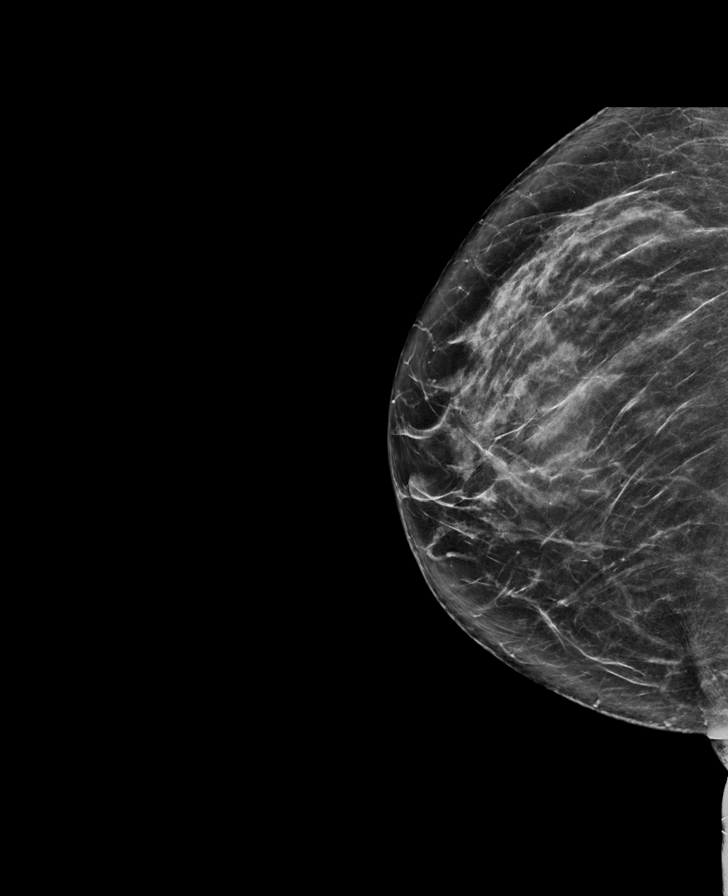

[L CC synth-2D]
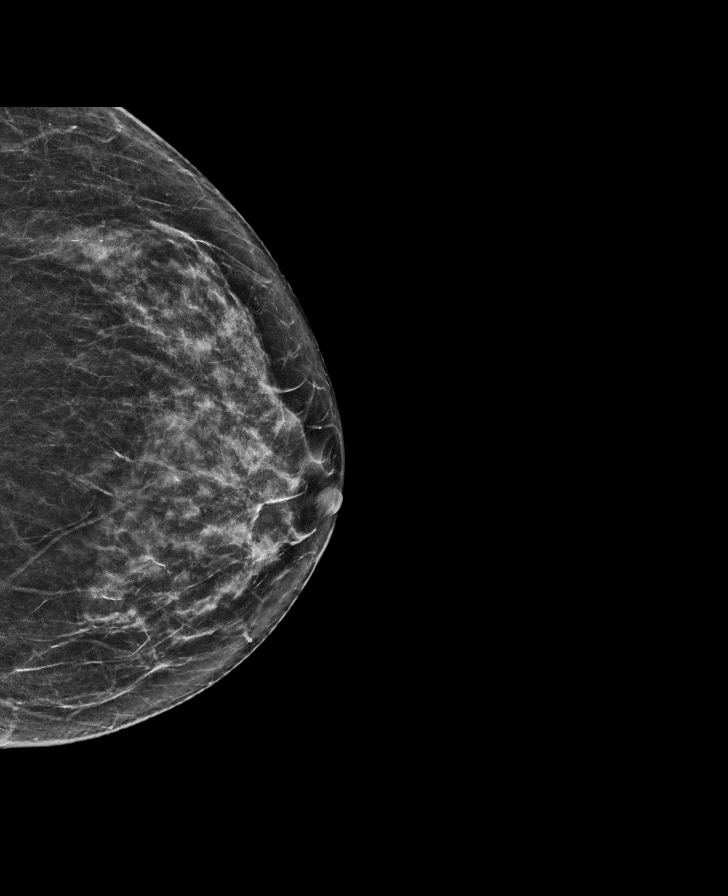

[L MLO synth-2D]
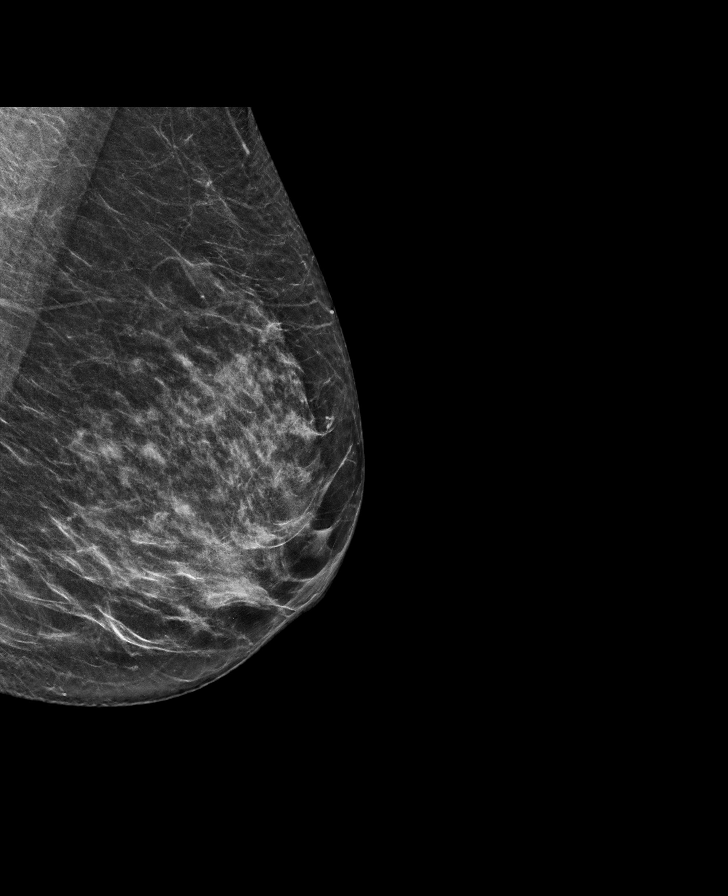

[R MLO synth-2D]
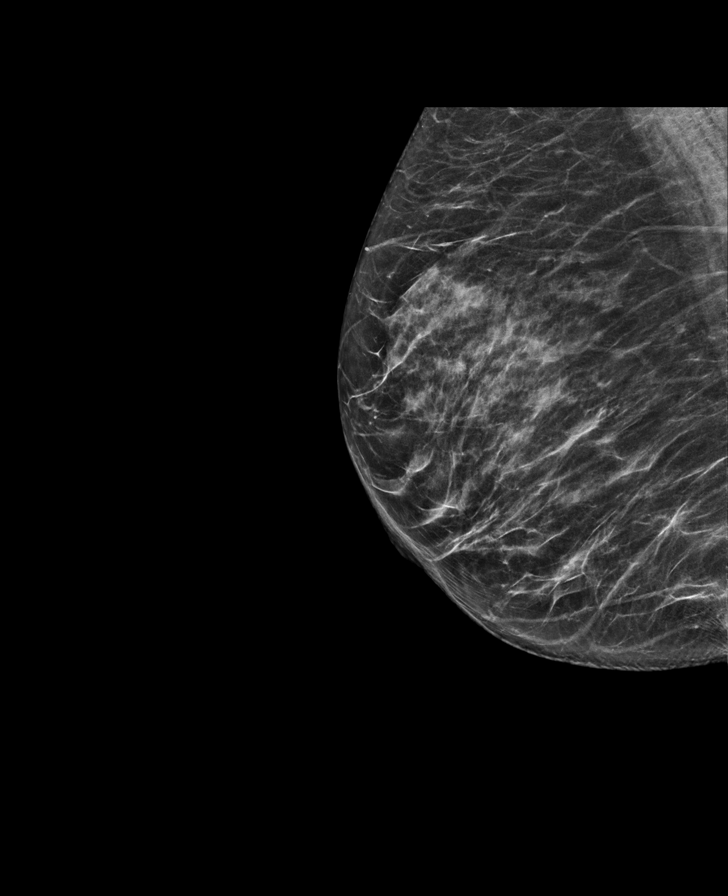

[L CC tomo · tomo slice 33/64.0]
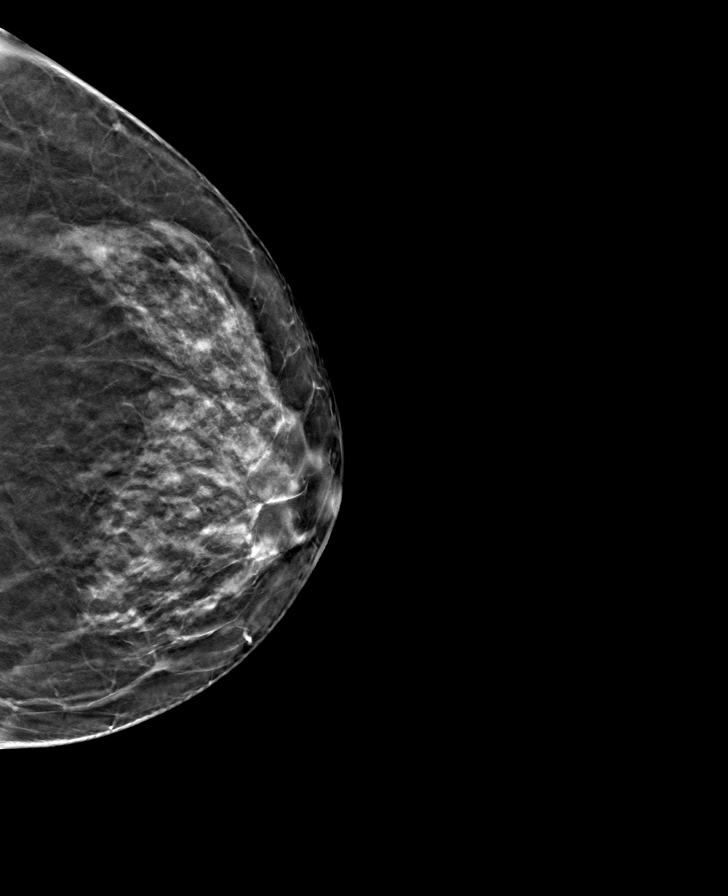

[R CC tomo · tomo slice 37/72.0]
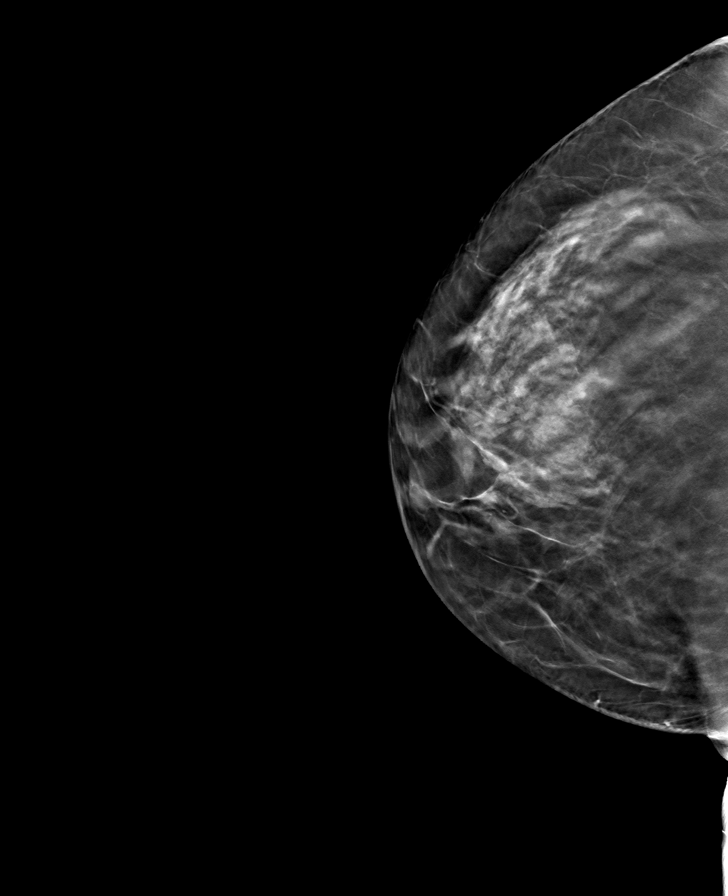

[R MLO tomo · tomo slice 33/66.0]
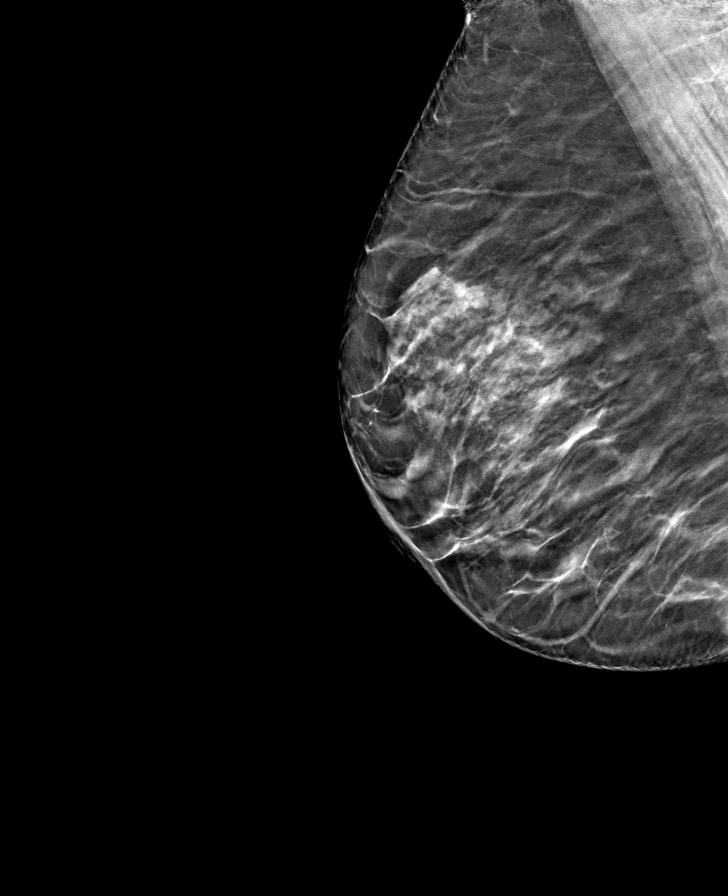

[L MLO tomo · tomo slice 34/67.0]
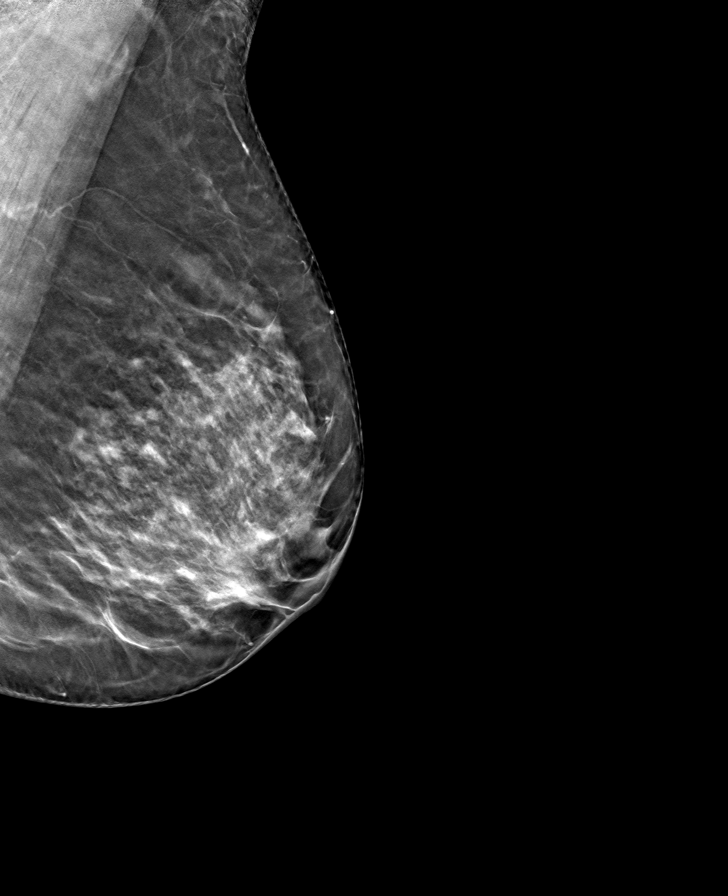

[8 of 24 positions shown; findings below may reference images not displayed]

ACR Breast Density Category c: The breast tissue is heterogeneously
dense, which may obscure small masses.
FINDINGS: There are no findings suspicious for malignancy. Images were
processed with CAD.
IMPRESSION: No mammographic evidence of malignancy. A result letter of this
screening mammogram will be mailed directly to the patient.

RECOMMENDATION:
Screening mammogram in one year. (Code:[5V])

BI-RADS CATEGORY  1: Negative.

## 2019-05-29 DIAGNOSIS — Z20822 Contact with and (suspected) exposure to covid-19: Secondary | ICD-10-CM | POA: Diagnosis not present

## 2019-05-29 DIAGNOSIS — R519 Headache, unspecified: Secondary | ICD-10-CM | POA: Diagnosis not present

## 2019-05-31 ENCOUNTER — Encounter: Payer: Self-pay | Admitting: Family Medicine

## 2019-05-31 ENCOUNTER — Other Ambulatory Visit: Payer: Self-pay

## 2019-05-31 ENCOUNTER — Ambulatory Visit (INDEPENDENT_AMBULATORY_CARE_PROVIDER_SITE_OTHER): Payer: Medicare Other | Admitting: Family Medicine

## 2019-05-31 DIAGNOSIS — R509 Fever, unspecified: Secondary | ICD-10-CM | POA: Diagnosis not present

## 2019-05-31 DIAGNOSIS — Z20822 Contact with and (suspected) exposure to covid-19: Secondary | ICD-10-CM

## 2019-05-31 DIAGNOSIS — R519 Headache, unspecified: Secondary | ICD-10-CM | POA: Diagnosis not present

## 2019-05-31 NOTE — Progress Notes (Signed)
Virtual Visit via Telephone The purpose of this virtual visit is to provide medical care while limiting exposure to the novel coronavirus (COVID19) for both patient and office staff.  Consent was obtained for phone visit:  Yes.   Answered questions that patient had about telehealth interaction:  Yes.   I discussed the limitations, risks, security and privacy concerns of performing an evaluation and management service by telephone. I also discussed with the patient that there may be a patient responsible charge related to this service. The patient expressed understanding and agreed to proceed.  Patient Location: Home Provider Location: [facility])   ---------------------------------------------------------------------- Chief Complaint  Patient presents with  . Headache    low grade fever 99.2, lack of appetite, joint pain 3 days    S: Reviewed CMA documentation. I have called patient and gathered additional HPI as follows:  SUSPECTED COVID19 / Low Grade Fever / headache Reports that symptoms started about 3 days ago with low grade fever, headache, joint pain episodic, and reduced appetite. - Tried OTC Tylenol PRN not taking often  Patient currently out of work - will. - She was advised to repeat COVID19 test prior to returning to work. Known exposure with husband at home who is [place]  Denies any sweats, cough, shortness of breath, sinus pain or pressure, abdominal pain, diarrhea  -------------------------------------------------------------------------- O: No physical exam performed due to remote telephone encounter.  -------------------------------------------------------------------------- A&P:   Suspected COVID19 Very close exposure at home, Husband COVID19 Positive same household Onset symptoms 3 days ago, consistent with COVID / viral syndrome Low grade temp No comorbid pulmonary conditions (asthma, COPD) or  immunocompromise  1. Reassurance, presume COVID19 - home quarantine 2. Tylenol 1g TID PRN for fever / aches 3. Continue improved hydration, gradual improve PO intake as tolerated 4. Monitor fever and symptoms 5. Repeat test per work protocol this weekend Saturday prior to return to work  No orders of the defined types were placed in this encounter.   REQUIRED self quarantine to [place] - advised to avoid all exposure with others while during treatment. Should continue to quarantine for up to 7-14 days - pending resolution of symptoms and UPCOMING REPEAT TEST, if TEST IS NEGATIVE and symptoms resolve by 7 days and is afebrile >3 days - may STOP self quarantine at that time. IF test is POSITIVE then will require 10 additional day quarantine after date of positive test result.   If symptoms do not resolve or significantly improve OR if WORSENING - fever / cough - or worsening shortness of breath - then should contact us and seek advice on next steps in treatment at home vs where/when to seek care at Urgent Care or Hospital ED for further intervention and possible testing if indicated.  Patient verbalizes understanding with the above medical recommendations including the limitation of remote medical advice.  Specific follow-up / call-back criteria were given for patient to follow-up or seek medical care more urgently if needed.   - Time spent in direct consultation with patient on phone: 7 minutes   Nobie Putnam, [facility] 05/31/2019, 2:57 PM

## 2019-06-02 ENCOUNTER — Telehealth: Payer: Self-pay

## 2019-06-02 NOTE — Telephone Encounter (Signed)
Open in error, pt called for her husband.

## 2019-06-07 ENCOUNTER — Other Ambulatory Visit: Payer: Self-pay

## 2019-06-07 ENCOUNTER — Encounter: Payer: Self-pay | Admitting: Emergency Medicine

## 2019-06-07 ENCOUNTER — Emergency Department
Admission: EM | Admit: 2019-06-07 | Discharge: 2019-06-07 | Disposition: A | Discharge status: Home / Self Care | Payer: Medicare Other | Attending: Student | Admitting: Student

## 2019-06-07 ENCOUNTER — Emergency Department: Payer: Medicare Other

## 2019-06-07 DIAGNOSIS — R0789 Other chest pain: Secondary | ICD-10-CM | POA: Diagnosis present

## 2019-06-07 DIAGNOSIS — Z85048 Personal history of other malignant neoplasm of rectum, rectosigmoid junction, and anus: Secondary | ICD-10-CM | POA: Insufficient documentation

## 2019-06-07 DIAGNOSIS — I10 Essential (primary) hypertension: Secondary | ICD-10-CM | POA: Insufficient documentation

## 2019-06-07 DIAGNOSIS — Z87891 Personal history of nicotine dependence: Secondary | ICD-10-CM | POA: Diagnosis not present

## 2019-06-07 DIAGNOSIS — R5381 Other malaise: Secondary | ICD-10-CM

## 2019-06-07 DIAGNOSIS — Z79899 Other long term (current) drug therapy: Secondary | ICD-10-CM | POA: Diagnosis not present

## 2019-06-07 DIAGNOSIS — U071 COVID-19: Secondary | ICD-10-CM | POA: Diagnosis not present

## 2019-06-07 DIAGNOSIS — R079 Chest pain, unspecified: Secondary | ICD-10-CM | POA: Diagnosis not present

## 2019-06-07 LAB — BASIC METABOLIC PANEL
Anion gap: 9 (ref 5–15)
BUN: 18 mg/dL (ref 8–23)
CO2: 25 mmol/L (ref 22–32)
Calcium: 8.5 mg/dL — ABNORMAL LOW (ref 8.9–10.3)
Chloride: 102 mmol/L (ref 98–111)
Creatinine, Ser: 0.9 mg/dL (ref 0.44–1.00)
GFR calc Af Amer: >60 mL/min (ref >60)
GFR calc non Af Amer: >60 mL/min (ref >60)
Glucose, Bld: 106 mg/dL — ABNORMAL HIGH (ref 70–99)
Potassium: 4.2 mmol/L (ref 3.5–5.1)
Sodium: 136 mmol/L (ref 135–145)

## 2019-06-07 LAB — CBC
HCT: 41.3 % (ref 36.0–46.0)
Hemoglobin: 13.6 g/dL (ref 12.0–15.0)
MCH: 29.8 pg (ref 26.0–34.0)
MCHC: 32.9 g/dL (ref 30.0–36.0)
MCV: 90.4 fL (ref 80.0–100.0)
Platelets: 129 10*3/uL — ABNORMAL LOW (ref 150–400)
RBC: 4.57 MIL/uL (ref 3.87–5.11)
RDW: 12.2 % (ref 11.5–15.5)
WBC: 3.9 10*3/uL — ABNORMAL LOW (ref 4.0–10.5)
nRBC: 0 % (ref 0.0–0.2)

## 2019-06-07 LAB — TROPONIN I (HIGH SENSITIVITY)
Troponin I (High Sensitivity): 6 ng/L (ref <18)
Troponin I (High Sensitivity): 6 ng/L (ref <18)

## 2019-06-07 IMAGING — CR DG CHEST 2V
1 series · 2 of 2 positions shown · non-contrast
Comparison: [DATE]

CLINICAL DATA: Chest pain for 2 days. Diagnosed with [5Y] 1
week ago.

EXAM:
CHEST - 2 VIEW

[Series 1: w chest pa · 0.14mm/px · 2 of 2 slices shown]
[im 1/2]
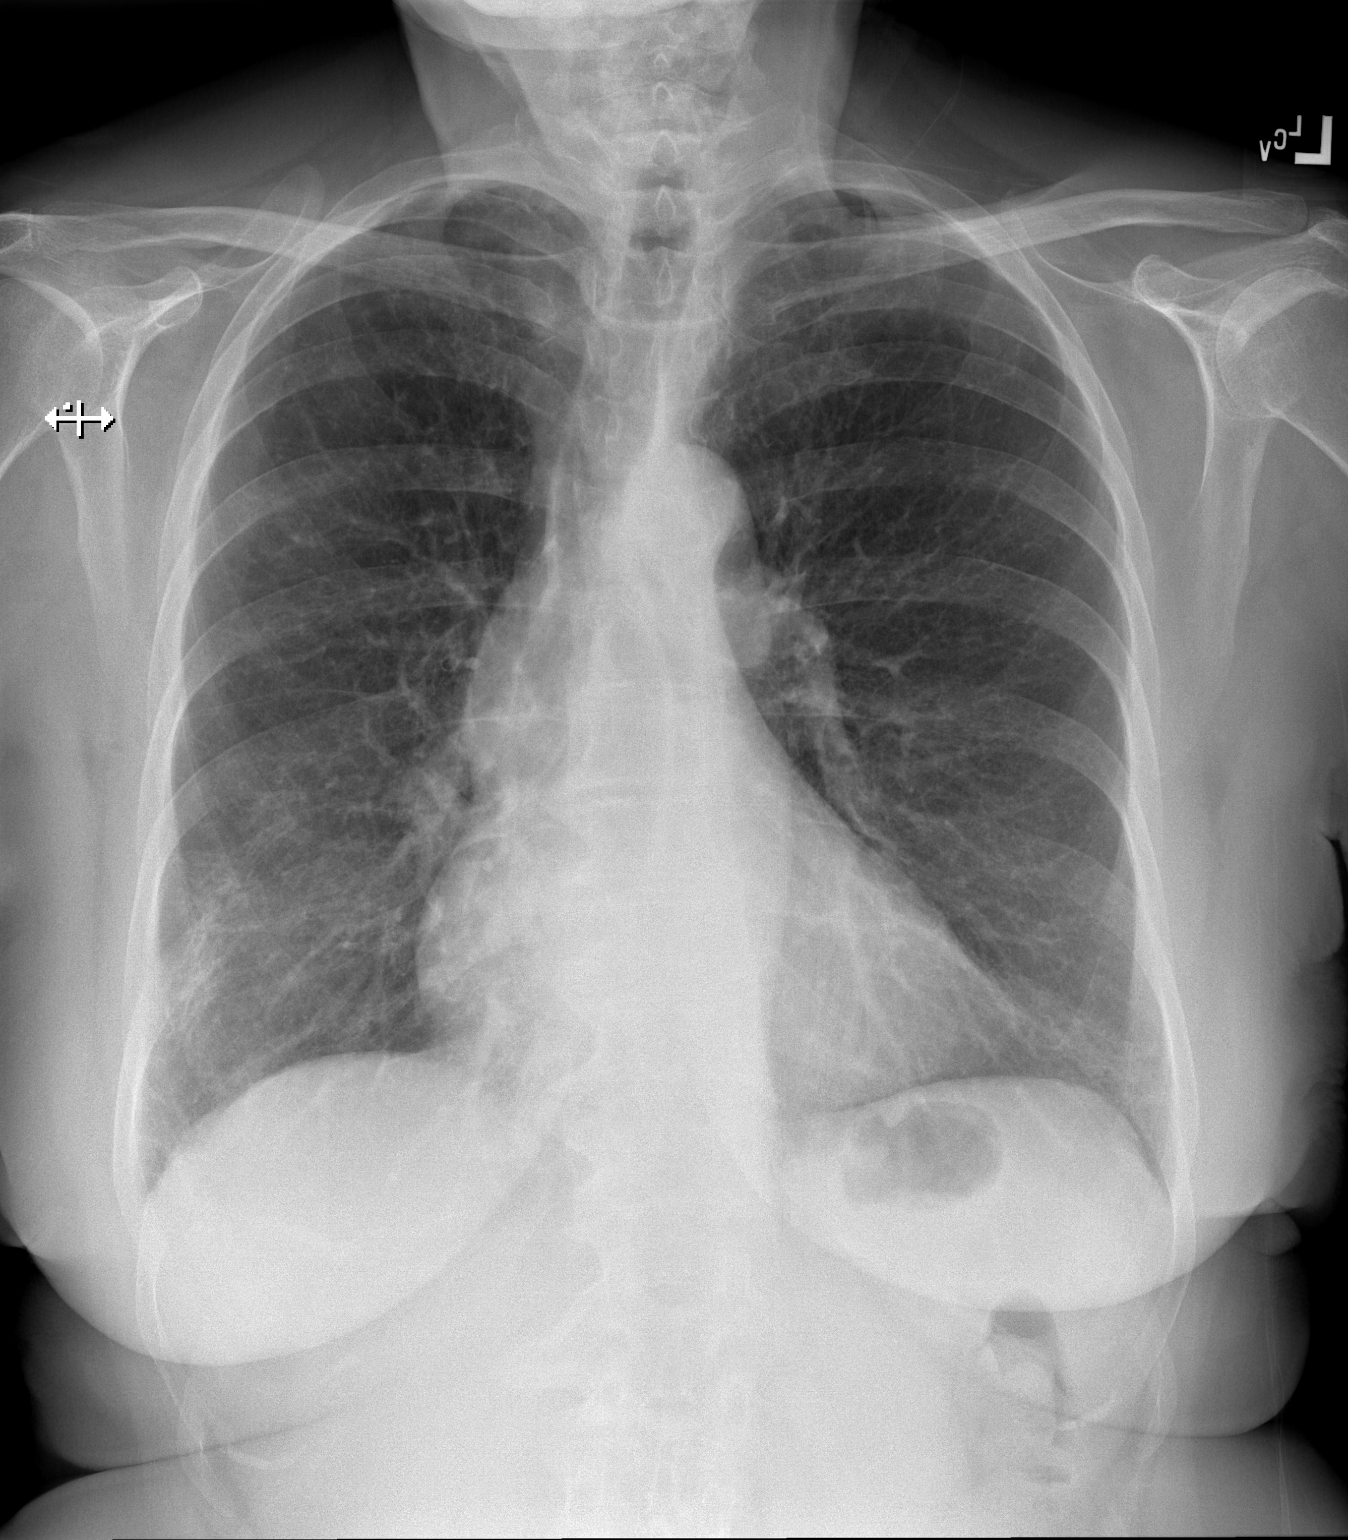
[im 2/2]
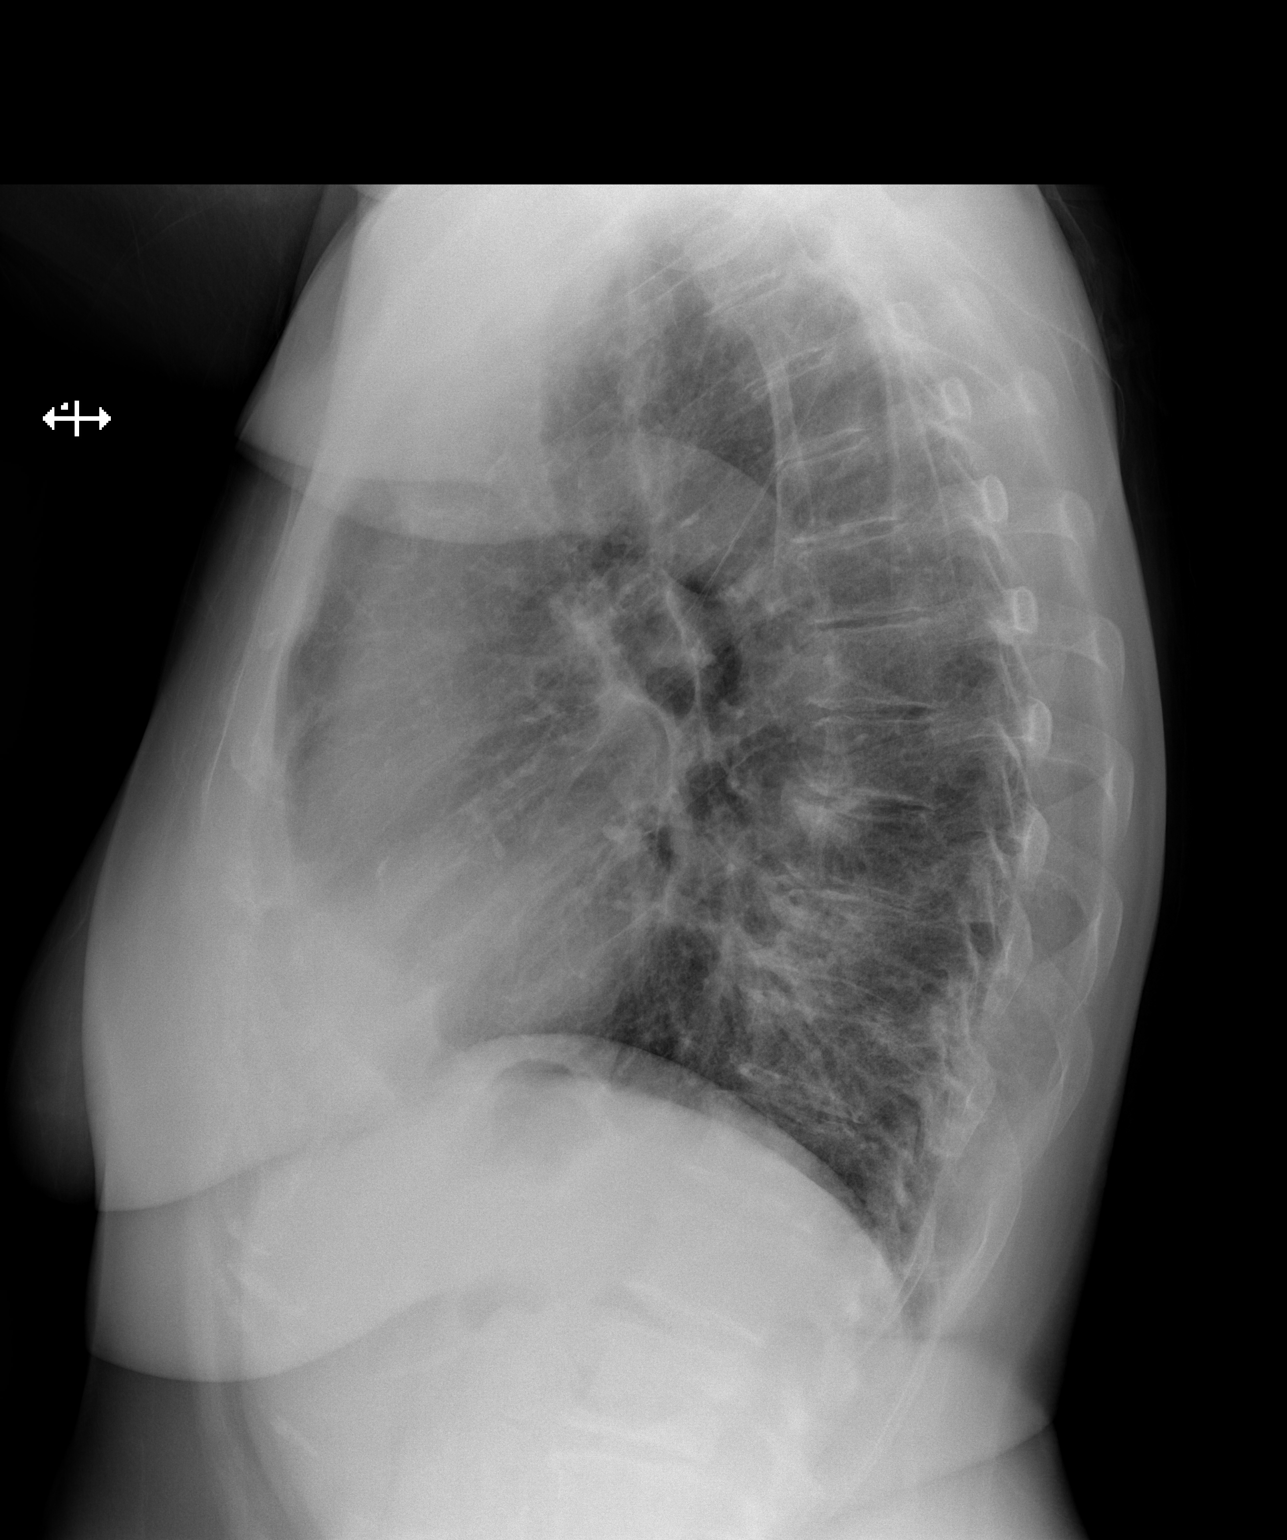

[2 of 2 positions shown; findings below may reference images not displayed]

FINDINGS: There is infiltrate in the periphery of the right lung base, not
present on the previous study. No pneumothorax. No nodules or
masses. The heart size remains borderline. The hila and mediastinum
are unchanged.
IMPRESSION: New infiltrate in the right base worrisome for pneumonia given
history of [5Y] diagnosed 1 week ago. Recommend short-term
follow-up imaging to ensure resolution.

## 2019-06-07 MED ORDER — SODIUM CHLORIDE 0.9% FLUSH: 3.0000 mL | Freq: Once | INTRAVENOUS | Status: DC

## 2019-06-07 MED ORDER — ASPIRIN 81 MG PO CHEW
324.0000 mg | CHEWABLE_TABLET | Freq: Once | ORAL | Status: AC
  Administered 2019-06-07: 15:00:00 324 mg via ORAL
  Filled 2019-06-07: qty 4

## 2019-06-07 MED ORDER — PREDNISONE 20 MG PO TABS: 40.0000 mg | ORAL_TABLET | Freq: Every day | ORAL | 0 refills | Status: AC

## 2019-06-07 MED ORDER — PREDNISONE 20 MG PO TABS
40.0000 mg | ORAL_TABLET | Freq: Once | ORAL | Status: AC
  Administered 2019-06-07: 15:00:00 40 mg via ORAL
  Filled 2019-06-07: qty 2

## 2019-06-07 NOTE — ED Provider Notes (Signed)
[facility] Emergency Department Provider Note  ____________________________________________   First MD Initiated Contact with Patient 06/07/19 1112     (approximate)  I have reviewed the triage vital signs and the nursing notes.  History  Chief Complaint Chest Pain and COVID positive    HPI Vanessa Reynolds is a 74 y.o. female known COVID positive (06/01/18), rectal cancer who presents to the emergency department for worsening COVID symptoms.  Patient states she is primarily bothered by low-grade fever, generalized fatigue, body aches/myalgia, lack of energy, lack of appetite.  She denies any vomiting or diarrhea.  Also reports a generalized, mild chest discomfort for the last 2 days, constant.  No radiation.  No alleviating/aggravating factors.  No history of pulmonary disease or CAD.  No significant cough or shortness of breath.   Past Medical Hx Past Medical History:  Diagnosis Date  . Abdominal pain, left lower quadrant 2012  . Breast screening, unspecified   . H/O cystitis 2011  . Hypertension   . Nausea with vomiting   . Personal history of malignant neoplasm of large intestine 2013  . Personal history of tobacco use, presenting hazards to health   . 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    Problem List Patient Active Problem List   Diagnosis Date Noted  . Osteopenia of left forearm 07/06/2018  . Overweight (BMI 25.0-29.9) 12/29/2017  . Allergic rhinitis due to allergen 09/16/2016  . Environmental and seasonal allergies 09/16/2016  . Essential hypertension 02/20/2015  . Hyperlipidemia 02/20/2015  . History of rectal cancer 03/14/2014  . Personal history of malignant neoplasm of large intestine     Past Surgical Hx Past Surgical History:  Procedure Laterality Date  . APPENDECTOMY  December 2011  . BILATERAL OOPHORECTOMY  December 2011   Completed at time of low anterior resection.  . COLON  SURGERY  03/14/10   lap assisted low anterior resection-adenocarcinoma of the rectum and a villous adenoma  . COLONOSCOPY  2011,2012,2015   Dr. Vira Agar, Dr. Addison Lank)    Medications Prior to Admission medications   Medication Sig Start Date End Date Taking? Authorizing Provider  amLODipine (NORVASC) 2.5 MG tablet Take 1 tablet (2.5 mg total) by mouth daily. 11/11/18   Karamalegos, Devonne Doughty, DO  cholecalciferol (VITAMIN D3) 25 MCG (1000 UT) tablet Take 1,000 Units by mouth daily.    [provider]  fluticasone (FLONASE) 50 MCG/ACT nasal spray Place 2 sprays into both nostrils daily. Use for 4-6 weeks then stop and use seasonally or as needed. Patient not taking: Reported on 05/31/2019 09/23/18   Olin Hauser, DO  lisinopril (ZESTRIL) 40 MG tablet Take 1 tablet (40 mg total) by mouth daily. 11/11/18   Karamalegos, Devonne Doughty, DO  loratadine (CLARITIN) 10 MG tablet Take 1 tablet (10 mg total) by mouth daily. Use for 4-6 weeks then stop, and use as needed or seasonally 09/23/18   Parks Ranger, Devonne Doughty, DO  lovastatin (MEVACOR) 20 MG tablet TAKE 1 TABLET BY MOUTH AT BEDTIME. 08/03/18   Karamalegos, Devonne Doughty, DO  Melatonin 5 MG CAPS Take by mouth.    [provider]  Probiotic Product (PROBIOTIC DAILY PO) Take 1 tablet by mouth daily.    [provider]  vitamin B-12 (CYANOCOBALAMIN) 1000 MCG tablet Take 1,000 mcg by mouth daily.    [provider]    Allergies Fish oil  Family Hx Family History  Problem Relation Age of  Onset  . Hypertension Mother        deceased, age 72  . Cancer Father        gastric cancer, deceased, mid 57's  . Breast cancer Neg Hx     Social Hx Social History   Tobacco Use  . Smoking status: Former Smoker    Packs/day: 1.00    Years: 30.00    Pack years: 30.00    Types: Cigarettes    Quit date: 05/20/1997    Years since quitting: 22.0  . Smokeless tobacco: Never Used  . Tobacco comment: quit in 1999    Substance Use Topics  . Alcohol use: No  . Drug use: No     Review of Systems  Constitutional: + for fever, malaise, generalized body aches, fatigue, lack of appetite. Eyes: Negative for visual changes. ENT: Negative for sore throat. Cardiovascular: Negative for chest pain. Respiratory: Negative for shortness of breath. Gastrointestinal: Negative for nausea, vomiting.  Genitourinary: Negative for dysuria. Musculoskeletal: Negative for leg swelling. Skin: Negative for rash. Neurological: Negative for for headaches.   Physical Exam  Vital Signs: ED Triage Vitals  Enc Vitals Group     BP 06/07/19 0906 108/64     Pulse Rate 06/07/19 0906 71     Resp 06/07/19 0906 20     Temp 06/07/19 0906 99.7 F (37.6 C)     Temp Source 06/07/19 0906 Oral     SpO2 06/07/19 0906 94 %     Weight 06/07/19 0907 162 lb (73.5 kg)     Height 06/07/19 0907 5\' 4"  (1.626 m)     Head Circumference --      Peak Flow --      Pain Score 06/07/19 0906 7     Pain Loc --      Pain Edu? --      Excl. in [place]? --     Constitutional: Alert and oriented.  Appears fatigued. Head: Normocephalic. Atraumatic. Eyes: Conjunctivae clear. Sclera anicteric. Nose: No congestion. No rhinorrhea. Mouth/Throat: Wearing mask.  Neck: No stridor.   Cardiovascular: Normal rate, regular rhythm. Extremities well perfused. Respiratory: Normal respiratory effort.  Lungs CTAB.  Oxygen remains above 93% on ambulatory challenge.  No hypoxia. Gastrointestinal: Soft. Non-tender. Non-distended.  Musculoskeletal: No lower extremity edema. No deformities. Neurologic:  Normal speech and language. No gross focal neurologic deficits are appreciated.  Skin: Skin is warm, dry and intact. No rash noted. Psychiatric: Mood and affect are appropriate for situation.  EKG  Personally reviewed.   Rate: 69 Rhythm: sinus Axis: normal Intervals: WNL No acute ischemic changes No STEMI    Radiology  CXR: IMPRESSION:  New infiltrate  in the right base worrisome for pneumonia given  history of COVID-19 diagnosed 1 week ago. Recommend short-term  follow-up imaging to ensure resolution.    Procedures  Procedure(s) performed (including critical care):  Procedures   Initial Impression / Assessment and Plan / ED Course  74 y.o. female known COVID positive who presents to the ED for worsening symptoms, primarily body aches, fatigue, lack of appetite.  Presentation consistent with COVID. Does have some infiltrates on her CXR, but fortunately she has no hypoxia at rest or with ambulation.    Will opt to treat with course of steroids given her XR findings, but does not require admission at this time with regards to her respiratory status.  EKG without acute ischemic changes.  Troponin x 2 negative.  We will plan for discharge with course of steroids given  her CXR findings.  Also advised obtaining over-the-counter multivitamin, zinc.  Discussed strict return precautions.  Patient voices understanding and is comfortable with the plan and discharge.   Final Clinical Impression(s) / ED Diagnosis  Final diagnoses:  COVID-19  Malaise       Note:  This document was prepared using Dragon voice recognition software and may include unintentional dictation errors.   Lilia Pro., MD 06/07/19 1524

## 2019-06-07 NOTE — Discharge Instructions (Addendum)
Thank you for letting us take care of you in the emergency department today.   Please continue to take any regular, prescribed medications.   New medications we have prescribed:  - Prednisone - steroid medication, please take as directed with food in the morning  You can also obtain and start taking over the counter multivitamin and zinc.   Please follow up with: - Your primary care doctor to review your ER visit and follow up on your symptoms.    Please return to the ER for any new or worsening symptoms such as difficulty breathing, vomiting, chest pain.

## 2019-06-07 NOTE — ED Notes (Signed)
Pt ambulated without assistance. Pt ambulated on RA, O2 sat maintained between 93-95% on RA.

## 2019-06-07 NOTE — ED Triage Notes (Signed)
States chest pain x 2 days. States was diagnosed with COVID over one week ago.

## 2019-06-08 ENCOUNTER — Encounter: Payer: Self-pay | Admitting: *Deleted

## 2019-07-09 ENCOUNTER — Telehealth: Payer: Self-pay | Admitting: Family Medicine

## 2019-07-09 NOTE — Chronic Care Management (AMB) (Signed)
  Chronic Care Management   Outreach Note  07/09/2019 Name: MARCIE VANDRUNEN MRN: [id number] DOB: Jul 12, 1945  LARYN WOLVERTON is a 74 y.o. year old female who is a primary care patient of Olin Hauser, DO. I reached out to Alonna Buckler by phone today in response to a referral sent by Ms. Michell Heinrich [place]'s health plan.     An unsuccessful telephone outreach was attempted today. The patient was referred to the case management team for assistance with care management and care coordination.   Follow Up Plan: The care management team will reach out to the patient again over the next 7 days.  If patient returns call to provider office, please advise to call Ila at [place], [place], [place], [place] [address] Direct Dial: [phone] [email or web address] Website: [email or web address]

## 2019-07-12 ENCOUNTER — Encounter: Payer: Medicare Other | Admitting: Family Medicine

## 2019-07-12 NOTE — Chronic Care Management (AMB) (Signed)
  Chronic Care Management   Note  07/12/2019 Name: Vanessa Reynolds MRN: [id number] DOB: [birth year]  Vanessa Reynolds is a 74 y.o. year old female who is a primary care patient of Olin Hauser, DO. I reached out to Alonna Buckler by phone today in response to a referral sent by [name]'s health plan.     [name] was given information about Chronic Care Management services today including:  1. CCM service includes personalized support from designated clinical staff supervised by her physician, including individualized plan of care and coordination with other care providers 2. 24/7 contact phone numbers for assistance for urgent and routine care needs. 3. Service will only be billed when office clinical staff spend 20 minutes or more in a month to coordinate care. 4. Only one practitioner may furnish and bill the service in a calendar month. 5. The patient may stop CCM services at any time (effective at the end of the month) by phone call to the office staff. 6. The patient will be responsible for cost sharing (co-pay) of up to 20% of the service fee (after annual deductible is met).  Patient did not agree to enrollment in care management services and does not wish to consider at this time.  Follow up plan: The patient has been provided with contact information for the care management team and has been advised to call with any health related questions or concerns.   Noreene Larsson, [place], [place], [place] [address] Direct Dial: [phone] [email or web address] Website: [email or web address]

## 2019-07-29 ENCOUNTER — Telehealth: Payer: Self-pay | Admitting: Family Medicine

## 2019-07-29 DIAGNOSIS — Z0000 Encounter for general adult medical examination without abnormal findings: Secondary | ICD-10-CM

## 2019-07-29 DIAGNOSIS — R7309 Other abnormal glucose: Secondary | ICD-10-CM

## 2019-07-29 DIAGNOSIS — E782 Mixed hyperlipidemia: Secondary | ICD-10-CM

## 2019-07-29 DIAGNOSIS — I10 Essential (primary) hypertension: Secondary | ICD-10-CM

## 2019-07-29 DIAGNOSIS — Z85048 Personal history of other malignant neoplasm of rectum, rectosigmoid junction, and anus: Secondary | ICD-10-CM

## 2019-07-29 NOTE — Telephone Encounter (Signed)
New orders signed  Nobie Putnam, DO [facility] 07/29/2019, 1:50 PM

## 2019-08-02 ENCOUNTER — Other Ambulatory Visit: Payer: Medicare Other

## 2019-08-03 ENCOUNTER — Other Ambulatory Visit: Payer: Medicare Other

## 2019-08-03 ENCOUNTER — Other Ambulatory Visit: Payer: Self-pay

## 2019-08-03 DIAGNOSIS — Z0000 Encounter for general adult medical examination without abnormal findings: Secondary | ICD-10-CM | POA: Diagnosis not present

## 2019-08-03 DIAGNOSIS — E782 Mixed hyperlipidemia: Secondary | ICD-10-CM | POA: Diagnosis not present

## 2019-08-03 DIAGNOSIS — R7309 Other abnormal glucose: Secondary | ICD-10-CM | POA: Diagnosis not present

## 2019-08-03 DIAGNOSIS — I10 Essential (primary) hypertension: Secondary | ICD-10-CM | POA: Diagnosis not present

## 2019-08-04 LAB — CBC WITH DIFFERENTIAL/PLATELET
Absolute Monocytes: 1075 cells/uL — ABNORMAL HIGH (ref 200–950)
Basophils Absolute: 34 cells/uL (ref 0–200)
Basophils Relative: 0.4 %
Eosinophils Absolute: 50 cells/uL (ref 15–500)
Eosinophils Relative: 0.6 %
HCT: 36.5 % (ref 35.0–45.0)
Hemoglobin: 12.1 g/dL (ref 11.7–15.5)
Lymphs Abs: 1588 cells/uL (ref 850–3900)
MCH: 29.6 pg (ref 27.0–33.0)
MCHC: 33.2 g/dL (ref 32.0–36.0)
MCV: 89.2 fL (ref 80.0–100.0)
MPV: 13.8 fL — ABNORMAL HIGH (ref 7.5–12.5)
Monocytes Relative: 12.8 %
Neutro Abs: 5653 cells/uL (ref 1500–7800)
Neutrophils Relative %: 67.3 %
Platelets: 151 10*3/uL (ref 140–400)
RBC: 4.09 10*6/uL (ref 3.80–5.10)
RDW: 13.4 % (ref 11.0–15.0)
Total Lymphocyte: 18.9 %
WBC: 8.4 10*3/uL (ref 3.8–10.8)

## 2019-08-04 LAB — COMPLETE METABOLIC PANEL WITH GFR
AG Ratio: 1.1 (calc) (ref 1.0–2.5)
ALT: 32 U/L — ABNORMAL HIGH (ref 6–29)
AST: 41 U/L — ABNORMAL HIGH (ref 10–35)
Albumin: 3.3 g/dL — ABNORMAL LOW (ref 3.6–5.1)
Alkaline phosphatase (APISO): 184 U/L — ABNORMAL HIGH (ref 37–153)
BUN/Creatinine Ratio: 24 (calc) — ABNORMAL HIGH (ref 6–22)
BUN: 35 mg/dL — ABNORMAL HIGH (ref 7–25)
CO2: 25 mmol/L (ref 20–32)
Calcium: 9 mg/dL (ref 8.6–10.4)
Chloride: 100 mmol/L (ref 98–110)
Creat: 1.48 mg/dL — ABNORMAL HIGH (ref 0.60–0.93)
GFR, Est African American: 40 mL/min/{1.73_m2} — ABNORMAL LOW (ref >=60)
GFR, Est Non African American: 35 mL/min/{1.73_m2} — ABNORMAL LOW (ref >=60)
Globulin: 3.1 g/dL (calc) (ref 1.9–3.7)
Glucose, Bld: 107 mg/dL — ABNORMAL HIGH (ref 65–99)
Potassium: 4.1 mmol/L (ref 3.5–5.3)
Sodium: 134 mmol/L — ABNORMAL LOW (ref 135–146)
Total Bilirubin: 0.6 mg/dL (ref 0.2–1.2)
Total Protein: 6.4 g/dL (ref 6.1–8.1)

## 2019-08-04 LAB — TSH: TSH: 2.09 mIU/L (ref 0.40–4.50)

## 2019-08-04 LAB — HEMOGLOBIN A1C
Hgb A1c MFr Bld: 5.3 % of total Hgb (ref <5.7)
Mean Plasma Glucose: 105 (calc)
eAG (mmol/L): 5.8 (calc)

## 2019-08-04 LAB — LIPID PANEL
Cholesterol: 166 mg/dL (ref <200)
HDL: 22 mg/dL — ABNORMAL LOW (ref >=50)
LDL Cholesterol (Calc): 111 mg/dL (calc) — ABNORMAL HIGH
Non-HDL Cholesterol (Calc): 144 mg/dL (calc) — ABNORMAL HIGH (ref <130)
Total CHOL/HDL Ratio: 7.5 (calc) — ABNORMAL HIGH (ref <5.0)
Triglycerides: 212 mg/dL — ABNORMAL HIGH (ref <150)

## 2019-08-05 ENCOUNTER — Ambulatory Visit (INDEPENDENT_AMBULATORY_CARE_PROVIDER_SITE_OTHER): Payer: Medicare Other | Admitting: Family Medicine

## 2019-08-05 ENCOUNTER — Other Ambulatory Visit: Payer: Self-pay

## 2019-08-05 ENCOUNTER — Encounter: Payer: Self-pay | Admitting: Family Medicine

## 2019-08-05 VITALS — BP 115/55 | HR 63 | Temp 97.5°F | Resp 16 | Ht 64.0 in | Wt 165.0 lb

## 2019-08-05 DIAGNOSIS — J3089 Other allergic rhinitis: Secondary | ICD-10-CM | POA: Diagnosis not present

## 2019-08-05 DIAGNOSIS — H6983 Other specified disorders of Eustachian tube, bilateral: Secondary | ICD-10-CM | POA: Diagnosis not present

## 2019-08-05 DIAGNOSIS — N179 Acute kidney failure, unspecified: Secondary | ICD-10-CM | POA: Diagnosis not present

## 2019-08-05 DIAGNOSIS — I10 Essential (primary) hypertension: Secondary | ICD-10-CM | POA: Diagnosis not present

## 2019-08-05 DIAGNOSIS — E7849 Other hyperlipidemia: Secondary | ICD-10-CM

## 2019-08-05 DIAGNOSIS — E663 Overweight: Secondary | ICD-10-CM | POA: Diagnosis not present

## 2019-08-05 DIAGNOSIS — Z0000 Encounter for general adult medical examination without abnormal findings: Secondary | ICD-10-CM

## 2019-08-05 DIAGNOSIS — H6122 Impacted cerumen, left ear: Secondary | ICD-10-CM

## 2019-08-05 MED ORDER — LORATADINE 10 MG PO TABS: 10.0000 mg | ORAL_TABLET | Freq: Every day | ORAL | 1 refills | Status: AC

## 2019-08-05 MED ORDER — LOVASTATIN 20 MG PO TABS: 20.0000 mg | ORAL_TABLET | Freq: Every day | ORAL | 3 refills | Status: DC

## 2019-08-05 MED ORDER — FLUTICASONE PROPIONATE 50 MCG/ACT NA SUSP: 2.0000 | Freq: Every day | NASAL | 1 refills | Status: DC

## 2019-08-05 NOTE — Assessment & Plan Note (Addendum)
Controlled HTN / no CKD but now with AKI No complications Failed Amlodipine 5mg  intolerance, off HCTZ    Plan:  1. Continue current BP meds - Amlodipine 2.5 - HOLD Lisinopril 40mg  daily temporarily, BMET in 2 weeks, adjust med 2. Lifestyle Mods - continue try to improve regular exercise, low sodium diet 3. Contiue Monitor BP at home

## 2019-08-05 NOTE — Patient Instructions (Addendum)
Thank you for coming to the office today.  Recent Labs    08/03/19 0835  HGBA1C 5.3   --------------------------------  LEFT Ear wax - call to schedule a ear wax removal fl[place] appointment if you are interested  Kidney function was abnormal on the blood work  It looks like a hydration issue. But that is puzzling - so keep improving hydration  Do not take any Ibuprofen Advil Aleve Naproxen  HOLD OFF on Lisinopril 40mg  daily for 2 weeks until you get the blood and we can let you know when to restart it.  DUE for NON-FASTING BLOOD WORK   SCHEDULE "Lab Only" visit in the morning at the clinic for lab draw in 1-2 WEEKS   Please schedule a Follow-up Appointment to: Return in about 2 weeks (around 08/19/2019) for 2 weeks repeat blood test.  If you have any other questions or concerns, please feel free to call the office or send a message through [place]. You may also schedule an earlier appointment if necessary.  Additionally, you may be receiving a survey about your experience at our office within a few days to 1 week by e-mail or mail. We value your feedback.  Nobie Putnam, DO [facility]

## 2019-08-05 NOTE — Assessment & Plan Note (Signed)
Controlled cholesterol on statin and improving lifestyle Last lipid panel 07/2019 Calculated ASCVD 10 yr risk score, elevated based on HTN, age  Plan: 1. Continue current meds - Lovastatin 20mg  daily 2. Future consider ASA 81mg  for primary ASCVD risk reduction 3. Encourage improved lifestyle - low carb/cholesterol, reduce portion size, continue improving regular exercise 4. Yearly lipids next 06/2019

## 2019-08-05 NOTE — Progress Notes (Signed)
Subjective:    Patient ID: Vanessa Reynolds, female    DOB: 1946/01/13, 74 y.o.   MRN: [id number]  Vanessa Reynolds is a 74 y.o. female presenting on 08/05/2019 for Annual Exam   HPI   Here for Annual Physical and Lab Review.  AKI, recent elevated creatinine Last lab 08/03/19 showed Creatinine up to 1.48 and BUN 35. She has been hydrating. She takes Lisinopril 40 and not on NSAID Prior baseline Cr 0.9 to 1  CHRONIC HTN: Reports normal BP readings Current Meds - Lisinopril 40mg  daily, Amlodipine 2.5mg  daily   Reports good compliance, took meds today. Tolerating well, w/o complaints. Denies CP, dyspnea, HA, edema, dizziness / lightheadedness  HYPERLIPIDEMIA: - Reports no concerns. Last lipid panel 07/2019, controlled mostly has had some inc TG - Currently taking Lovastatin 20mg  daily, tolerating well without side effects or myalgias  Sinuses / Allergies Chronic rhinitis symptoms as described with sinus congestion ear fluid popping and drainage. Helped w/ Loratadine and Flonase in past, request re order   Health Maintenance: She is due for COVID19 vaccine still, she had COVID in 05/2019 and did not get IV antibody infusion, so she is actually eligible for vaccine but she declines, will wait for her husband to get at same time  She is awaiting re-schedule Colonoscopy  Depression screen [facility] 2/9 08/05/2019 07/14/2018 07/06/2018  Decreased Interest 0 0 0  Down, Depressed, Hopeless 0 0 0  PHQ - 2 Score 0 0 0    Past Medical History:  Diagnosis Date  . Abdominal pain, left lower quadrant 2012  . Breast screening, unspecified   . H/O cystitis 2011  . Hypertension   . Nausea with vomiting   . Personal history of malignant neoplasm of large intestine 2013  . Personal history of tobacco use, presenting hazards to health   . 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   Past Surgical History:  Procedure Laterality Date  . APPENDECTOMY   December 2011  . BILATERAL OOPHORECTOMY  December 2011   Completed at time of low anterior resection.  . COLON SURGERY  03/14/10   lap assisted low anterior resection-adenocarcinoma of the rectum and a villous adenoma  . COLONOSCOPY  2011,2012,2015   Dr. Vira Agar, Dr. Addison Lank)   Social History   Socioeconomic History  . Marital status: Married    Spouse name: Not on file  . Number of children: Not on file  . Years of education: Not on file  . Highest education level: 10th grade  Occupational History    Comment: fulltime   Tobacco Use  . Smoking status: Former Smoker    Packs/day: 1.00    Years: 30.00    Pack years: 30.00    Types: Cigarettes    Quit date: 05/20/1997    Years since quitting: 22.2  . Smokeless tobacco: Never Used  . Tobacco comment: quit in 1999  Substance and Sexual Activity  . Alcohol use: No  . Drug use: No  . Sexual activity: Not on file  Other Topics Concern  . Not on file  Social History Narrative  . Not on file   Social Determinants of Health   Financial Resource Strain:   . Difficulty of Paying Living Expenses:   Food Insecurity:   . Worried About Charity fundraiser in the Last Year:   . [place] in the Last Year:   Transportation Needs:   . Lack of  Transportation (Medical):   Marland Kitchen Lack of Transportation (Non-Medical):   Physical Activity:   . Days of Exercise per Week:   . Minutes of Exercise per Session:   Stress:   . Feeling of Stress :   Social Connections:   . Frequency of Communication with Friends and Family:   . Frequency of Social Gatherings with Friends and Family:   . Attends Religious Services:   . Active Member of Clubs or Organizations:   . Attends Archivist Meetings:   Marland Kitchen Marital Status:   Intimate Partner Violence:   . Fear of Current or Ex-Partner:   . Emotionally Abused:   Marland Kitchen Physically Abused:   . Sexually Abused:    Family History  Problem Relation Age of Onset  . Hypertension Mother         deceased, age 60  . Cancer Father        gastric cancer, deceased, mid 29's  . Breast cancer Neg Hx    Current Outpatient Medications on File Prior to Visit  Medication Sig  . amLODipine (NORVASC) 2.5 MG tablet Take 1 tablet (2.5 mg total) by mouth daily. (Patient taking differently: Take 2.5 mg by mouth every evening. )  . cholecalciferol (VITAMIN D3) 25 MCG (1000 UT) tablet Take 1,000 Units by mouth daily.  Marland Kitchen lisinopril (ZESTRIL) 40 MG tablet Take 1 tablet (40 mg total) by mouth daily. (Patient taking differently: Take 40 mg by mouth every evening. )  . Multiple Vitamins-Minerals (ZINC PO) Take by mouth daily.  . Probiotic Product (PROBIOTIC DAILY PO) Take 1 tablet by mouth daily.  . vitamin B-12 (CYANOCOBALAMIN) 1000 MCG tablet Take 1,000 mcg by mouth daily.   No current facility-administered medications on file prior to visit.    Review of Systems  Constitutional: Negative for activity change, appetite change, chills, diaphoresis, fatigue and fever.  HENT: Positive for congestion, rhinorrhea and sinus pressure. Negative for hearing loss.   Eyes: Negative for visual disturbance.  Respiratory: Negative for apnea, cough, chest tightness, shortness of breath and wheezing.   Cardiovascular: Negative for chest pain, palpitations and leg swelling.  Gastrointestinal: Negative for abdominal pain, anal bleeding, blood in stool, constipation, diarrhea, nausea and vomiting.  Endocrine: Negative for cold intolerance.  Genitourinary: Negative for difficulty urinating, dysuria, frequency and hematuria.  Musculoskeletal: Negative for arthralgias, back pain and neck pain.  Skin: Negative for rash.  Allergic/Immunologic: Negative for environmental allergies.  Neurological: Negative for dizziness, weakness, light-headedness, numbness and headaches.  Hematological: Negative for adenopathy.  Psychiatric/Behavioral: Negative for behavioral problems, dysphoric mood and sleep disturbance.   Per HPI  unless specifically indicated above      Objective:    BP (!) 115/55   Pulse 63   Temp (!) 97.5 F (36.4 C) (Temporal)   Resp 16   Ht 5\' 4"  (1.626 m)   Wt 165 lb (74.8 kg)   SpO2 100%   BMI 28.32 kg/m   Wt Readings from Last 3 Encounters:  08/05/19 165 lb (74.8 kg)  06/07/19 162 lb (73.5 kg)  07/14/18 167 lb 3.2 oz (75.8 kg)    Physical Exam Vitals and nursing note reviewed.  Constitutional:      General: She is not in acute distress.    Appearance: She is well-developed. She is not diaphoretic.     Comments: Well-appearing, comfortable, cooperative  HENT:     Head: Normocephalic and atraumatic.     Right Ear: Tympanic membrane, ear canal and external ear normal.  Left Ear: Ear canal and external ear normal.     Ears:     Comments: L TM obscured by impacted cerumen Eyes:     General:        Right eye: No discharge.        Left eye: No discharge.     Conjunctiva/sclera: Conjunctivae normal.     Pupils: Pupils are equal, round, and reactive to light.  Neck:     Thyroid: No thyromegaly.  Cardiovascular:     Rate and Rhythm: Normal rate and regular rhythm.     Heart sounds: Normal heart sounds. No murmur.  Pulmonary:     Effort: Pulmonary effort is normal. No respiratory distress.     Breath sounds: Normal breath sounds. No wheezing or rales.  Abdominal:     General: Bowel sounds are normal. There is no distension.     Palpations: Abdomen is soft. There is no mass.     Tenderness: There is no abdominal tenderness.  Musculoskeletal:        General: No tenderness. Normal range of motion.     Cervical back: Normal range of motion and neck supple.     Comments: Upper / Lower Extremities: - Normal muscle tone, strength bilateral upper extremities 5/5, lower extremities 5/5  Lymphadenopathy:     Cervical: No cervical adenopathy.  Skin:    General: Skin is warm and dry.     Findings: No erythema or rash.  Neurological:     Mental Status: She is alert and oriented  to person, place, and time.     Comments: Distal sensation intact to light touch all extremities  Psychiatric:        Behavior: Behavior normal.     Comments: Well groomed, good eye contact, normal speech and thoughts       Results for orders placed or performed in visit on 08/02/19  CBC with Differential/Platelet  Result Value Ref Range   WBC 8.4 3.8 - 10.8 Thousand/uL   RBC 4.09 3.80 - 5.10 Million/uL   Hemoglobin 12.1 11.7 - 15.5 g/dL   HCT 36.5 35.0 - 45.0 %   MCV 89.2 80.0 - 100.0 fL   MCH 29.6 27.0 - 33.0 pg   MCHC 33.2 32.0 - 36.0 g/dL   RDW 13.4 11.0 - 15.0 %   Platelets 151 140 - 400 Thousand/uL   MPV 13.8 (H) 7.5 - 12.5 fL   Neutro Abs 5,653 1,500 - 7,800 cells/uL   Lymphs Abs 1,588 850 - 3,900 cells/uL   Absolute Monocytes 1,075 (H) 200 - 950 cells/uL   Eosinophils Absolute 50 15 - 500 cells/uL   Basophils Absolute 34 0 - 200 cells/uL   Neutrophils Relative % 67.3 %   Total Lymphocyte 18.9 %   Monocytes Relative 12.8 %   Eosinophils Relative 0.6 %   Basophils Relative 0.4 %  Hemoglobin A1c  Result Value Ref Range   Hgb A1c MFr Bld 5.3 <5.7 % of total Hgb   Mean Plasma Glucose 105 (calc)   eAG (mmol/L) 5.8 (calc)  Lipid panel  Result Value Ref Range   Cholesterol 166 <200 mg/dL   HDL 22 (L) > OR = 50 mg/dL   Triglycerides 212 (H) <150 mg/dL   LDL Cholesterol (Calc) 111 (H) mg/dL (calc)   Total CHOL/HDL Ratio 7.5 (H) <5.0 (calc)   Non-HDL Cholesterol (Calc) 144 (H) <130 mg/dL (calc)  COMPLETE METABOLIC PANEL WITH GFR  Result Value Ref Range   Glucose, Bld 107 (H)  65 - 99 mg/dL   BUN 35 (H) 7 - 25 mg/dL   Creat 1.48 (H) 0.60 - 0.93 mg/dL   GFR, Est Non African American 35 (L) > OR = 60 mL/min/1.49m2   GFR, Est African American 40 (L) > OR = 60 mL/min/1.71m2   BUN/Creatinine Ratio 24 (H) 6 - 22 (calc)   Sodium 134 (L) 135 - 146 mmol/L   Potassium 4.1 3.5 - 5.3 mmol/L   Chloride 100 98 - 110 mmol/L   CO2 25 20 - 32 mmol/L   Calcium 9.0 8.6 - 10.4 mg/dL    Total Protein 6.4 6.1 - 8.1 g/dL   Albumin 3.3 (L) 3.6 - 5.1 g/dL   Globulin 3.1 1.9 - 3.7 g/dL (calc)   AG Ratio 1.1 1.0 - 2.5 (calc)   Total Bilirubin 0.6 0.2 - 1.2 mg/dL   Alkaline phosphatase (APISO) 184 (H) 37 - 153 U/L   AST 41 (H) 10 - 35 U/L   ALT 32 (H) 6 - 29 U/L  TSH  Result Value Ref Range   TSH 2.09 0.40 - 4.50 mIU/L      Assessment & Plan:   Problem List Items Addressed This Visit    Overweight (BMI 25.0-29.9)   Hyperlipidemia    Controlled cholesterol on statin and improving lifestyle Last lipid panel 07/2019 Calculated ASCVD 10 yr risk score, elevated based on HTN, age  Plan: 1. Continue current meds - Lovastatin 20mg  daily 2. Future consider ASA 81mg  for primary ASCVD risk reduction 3. Encourage improved lifestyle - low carb/cholesterol, reduce portion size, continue improving regular exercise 4. Yearly lipids next 06/2019      Relevant Medications   lovastatin (MEVACOR) 20 MG tablet   Essential hypertension    Controlled HTN / no CKD but now with AKI No complications Failed Amlodipine 5mg  intolerance, off HCTZ    Plan:  1. Continue current BP meds - Amlodipine 2.5 - HOLD Lisinopril 40mg  daily temporarily, BMET in 2 weeks, adjust med 2. Lifestyle Mods - continue try to improve regular exercise, low sodium diet 3. Contiue Monitor BP at home      Relevant Medications   lovastatin (MEVACOR) 20 MG tablet   Other Relevant Orders   BASIC METABOLIC PANEL WITH GFR   Environmental and seasonal allergies   Relevant Medications   loratadine (CLARITIN) 10 MG tablet   fluticasone (FLONASE) 50 MCG/ACT nasal spray   Allergic rhinitis due to allergen   Relevant Medications   loratadine (CLARITIN) 10 MG tablet   fluticasone (FLONASE) 50 MCG/ACT nasal spray    Other Visit Diagnoses    Annual physical exam    -  Primary   AKI (acute kidney injury) ([place])       Relevant Orders   BASIC METABOLIC PANEL WITH GFR   Dysfunction of both eustachian tubes        Relevant Medications   loratadine (CLARITIN) 10 MG tablet   fluticasone (FLONASE) 50 MCG/ACT nasal spray   Impacted cerumen of left ear         #AKI elevated Cr Concern with acute kidney injury despite hydration Cr to 1.48 and BUN 35 Improve hydration AVOID NSAID HOLD Lisinopril 40mg  daily for now temporarily 1-2 weeks Re-Check BMET in 1-2 weeks, lab only non fasting, orders in Follow up result. - may have to adjust amlodipine if ACEi indicated  #L Cerumen impacted Return for cerumen lavage if interested, asymptomatic.  Updated Health Maintenance information Reviewed recent lab results with patient Encouraged improvement  to lifestyle with diet and exercise - Goal of weight loss    Meds ordered this encounter  Medications  . loratadine (CLARITIN) 10 MG tablet    Sig: Take 1 tablet (10 mg total) by mouth daily. Use for 4-6 weeks then stop, and use as needed or seasonally    Dispense:  90 tablet    Refill:  1  . fluticasone (FLONASE) 50 MCG/ACT nasal spray    Sig: Place 2 sprays into both nostrils daily. Use for 4-6 weeks then stop and use seasonally or as needed.    Dispense:  48 g    Refill:  1  . lovastatin (MEVACOR) 20 MG tablet    Sig: Take 1 tablet (20 mg total) by mouth at bedtime.    Dispense:  90 tablet    Refill:  3    Follow up plan: Return in about 2 weeks (around 08/19/2019) for 2 weeks repeat blood test.  Future BMET ordered   Nobie Putnam, [facility] 08/05/2019, 11:02 AM

## 2019-08-17 ENCOUNTER — Encounter: Payer: Self-pay | Admitting: Family Medicine

## 2019-08-17 ENCOUNTER — Other Ambulatory Visit: Payer: Self-pay

## 2019-08-17 ENCOUNTER — Ambulatory Visit (INDEPENDENT_AMBULATORY_CARE_PROVIDER_SITE_OTHER): Payer: Medicare Other | Admitting: Family Medicine

## 2019-08-17 ENCOUNTER — Other Ambulatory Visit: Payer: Medicare Other

## 2019-08-17 VITALS — BP 124/49 | HR 70 | Temp 97.7°F | Ht 64.0 in | Wt 164.2 lb

## 2019-08-17 DIAGNOSIS — N179 Acute kidney failure, unspecified: Secondary | ICD-10-CM

## 2019-08-17 DIAGNOSIS — I10 Essential (primary) hypertension: Secondary | ICD-10-CM

## 2019-08-17 DIAGNOSIS — H6122 Impacted cerumen, left ear: Secondary | ICD-10-CM | POA: Diagnosis not present

## 2019-08-17 NOTE — Progress Notes (Signed)
Subjective:    Patient ID: Vanessa Reynolds, female    DOB: [birth year], 74 y.o.   MRN: [id number]  Vanessa Reynolds is a 74 y.o. female presenting on 08/17/2019 for Cerumen Impaction (left ear)   HPI   Left Ear Cerumen Impaction Last apt 3/18 identified L cerumen wax impaction. She has reduced hearing and some fullness in L ear. Requesting ear flushing today. Denies any ear pain, drainage, ringing or tinnitus, injury.   Additionally Here today for lab test for BMET to follow up on reduced kidney function and BP med adjustment. She just gave blood, waiting on result now. Still holding off of Lisinopril 40mg  and her BP is normal range.   Depression screen [facility] 2/9 08/17/2019 08/05/2019 07/14/2018  Decreased Interest 0 0 0  Down, Depressed, Hopeless 0 0 0  PHQ - 2 Score 0 0 0    Social History   Tobacco Use  . Smoking status: Former Smoker    Packs/day: 1.00    Years: 30.00    Pack years: 30.00    Types: Cigarettes    Quit date: 05/20/1997    Years since quitting: 22.2  . Smokeless tobacco: Never Used  . Tobacco comment: quit in 1999  Substance Use Topics  . Alcohol use: No  . Drug use: No    Review of Systems Per HPI unless specifically indicated above     Objective:    BP (!) 124/49   Pulse 70   Temp 97.7 F (36.5 C) (Temporal)   Ht 5\' 4"  (1.626 m)   Wt 164 lb 3.2 oz (74.5 kg)   SpO2 99%   BMI 28.18 kg/m   Wt Readings from Last 3 Encounters:  08/17/19 164 lb 3.2 oz (74.5 kg)  08/05/19 165 lb (74.8 kg)  06/07/19 162 lb (73.5 kg)    Physical Exam Vitals and nursing note reviewed.  Constitutional:      General: She is not in acute distress.    Appearance: She is well-developed. She is not diaphoretic.     Comments: Well-appearing, comfortable, cooperative  HENT:     Head: Normocephalic and atraumatic.     Ears:     Comments: L TM obscured by cerumen. Eyes:     General:        Right eye: No discharge.        Left eye: No discharge.     Conjunctiva/sclera:  Conjunctivae normal.  Cardiovascular:     Rate and Rhythm: Normal rate.  Pulmonary:     Effort: Pulmonary effort is normal.  Skin:    General: Skin is warm and dry.     Findings: No erythema or rash.  Neurological:     Mental Status: She is alert and oriented to person, place, and time.  Psychiatric:        Behavior: Behavior normal.     Comments: Well groomed, good eye contact, normal speech and thoughts     ________________________________________________________ PROCEDURE NOTE Date: 08/17/19 Left Ear Lavage / Cerumen Removal Discussed benefits and risks (including pain / discomforts, dizziness, minor abrasion of ear canal). Verbal consent given by patient. Medication:  carbamide peroxide ear drops, Ear Lavage Solution (warm water + hydrogen peroxide) Performed by Dr Parks Ranger / Frederich Cha CMA Several drops of carbamide peroxide placed in L ear, allowed to sit for few minutes. Ear lavage solution flushed into L ear in attempt to dislodge and remove ear wax. Results were eventually successful.  Repeat Ear Exam: - Completely  removed cerumen now, with clear ear canals and visible TMs clear and normal appearance.   Results for orders placed or performed in visit on 08/02/19  CBC with Differential/Platelet  Result Value Ref Range   WBC 8.4 3.8 - 10.8 Thousand/uL   RBC 4.09 3.80 - 5.10 Million/uL   Hemoglobin 12.1 11.7 - 15.5 g/dL   HCT 36.5 35.0 - 45.0 %   MCV 89.2 80.0 - 100.0 fL   MCH 29.6 27.0 - 33.0 pg   MCHC 33.2 32.0 - 36.0 g/dL   RDW 13.4 11.0 - 15.0 %   Platelets 151 140 - 400 Thousand/uL   MPV 13.8 (H) 7.5 - 12.5 fL   Neutro Abs 5,653 1,500 - 7,800 cells/uL   Lymphs Abs 1,588 850 - 3,900 cells/uL   Absolute Monocytes 1,075 (H) 200 - 950 cells/uL   Eosinophils Absolute 50 15 - 500 cells/uL   Basophils Absolute 34 0 - 200 cells/uL   Neutrophils Relative % 67.3 %   Total Lymphocyte 18.9 %   Monocytes Relative 12.8 %   Eosinophils Relative 0.6 %   Basophils  Relative 0.4 %  Hemoglobin A1c  Result Value Ref Range   Hgb A1c MFr Bld 5.3 <5.7 % of total Hgb   Mean Plasma Glucose 105 (calc)   eAG (mmol/L) 5.8 (calc)  Lipid panel  Result Value Ref Range   Cholesterol 166 <200 mg/dL   HDL 22 (L) > OR = 50 mg/dL   Triglycerides 212 (H) <150 mg/dL   LDL Cholesterol (Calc) 111 (H) mg/dL (calc)   Total CHOL/HDL Ratio 7.5 (H) <5.0 (calc)   Non-HDL Cholesterol (Calc) 144 (H) <130 mg/dL (calc)  COMPLETE METABOLIC PANEL WITH GFR  Result Value Ref Range   Glucose, Bld 107 (H) 65 - 99 mg/dL   BUN 35 (H) 7 - 25 mg/dL   Creat 1.48 (H) 0.60 - 0.93 mg/dL   GFR, Est Non African American 35 (L) > OR = 60 mL/min/1.4m2   GFR, Est African American 40 (L) > OR = 60 mL/min/1.1m2   BUN/Creatinine Ratio 24 (H) 6 - 22 (calc)   Sodium 134 (L) 135 - 146 mmol/L   Potassium 4.1 3.5 - 5.3 mmol/L   Chloride 100 98 - 110 mmol/L   CO2 25 20 - 32 mmol/L   Calcium 9.0 8.6 - 10.4 mg/dL   Total Protein 6.4 6.1 - 8.1 g/dL   Albumin 3.3 (L) 3.6 - 5.1 g/dL   Globulin 3.1 1.9 - 3.7 g/dL (calc)   AG Ratio 1.1 1.0 - 2.5 (calc)   Total Bilirubin 0.6 0.2 - 1.2 mg/dL   Alkaline phosphatase (APISO) 184 (H) 37 - 153 U/L   AST 41 (H) 10 - 35 U/L   ALT 32 (H) 6 - 29 U/L  TSH  Result Value Ref Range   TSH 2.09 0.40 - 4.50 mIU/L      Assessment & Plan:   Problem List Items Addressed This Visit    None    Visit Diagnoses    Impacted cerumen of left ear    -  Primary   Hearing loss due to cerumen impaction, left          Significant amount of large thick impacted cerumen Left ear, suspected primary cause of current reduced bilateral hearing - Consider presbycusis in 63 yr patient, but seems less likely given history of more recent change and not gradual loss.  Plan: 1. Successful office ear lavage cerumen removal today, re-evaluated with clear  ear canals and normal TMs 2. Counseled on avoiding Q-tips and may use Kleenex as wick, use OTC Debrox as needed 3. Follow-up as  needed   No orders of the defined types were placed in this encounter.     Follow up plan: No follow-ups on file.  F/u lab result from today BMET to determine if should resume Lisinopril 40mg  or reduce to 20mg  or discontinue  Nobie Putnam, DO [facility] 08/17/2019, 10:57 AM

## 2019-08-18 ENCOUNTER — Other Ambulatory Visit: Payer: Self-pay | Admitting: Family Medicine

## 2019-08-18 DIAGNOSIS — I10 Essential (primary) hypertension: Secondary | ICD-10-CM

## 2019-08-18 LAB — BASIC METABOLIC PANEL WITH GFR
BUN/Creatinine Ratio: 19 (calc) (ref 6–22)
BUN: 18 mg/dL (ref 7–25)
CO2: 27 mmol/L (ref 20–32)
Calcium: 9.4 mg/dL (ref 8.6–10.4)
Chloride: 106 mmol/L (ref 98–110)
Creat: 0.96 mg/dL — ABNORMAL HIGH (ref 0.60–0.93)
GFR, Est African American: 68 mL/min/{1.73_m2} (ref >=60)
GFR, Est Non African American: 58 mL/min/{1.73_m2} — ABNORMAL LOW (ref >=60)
Glucose, Bld: 81 mg/dL (ref 65–139)
Potassium: 4.4 mmol/L (ref 3.5–5.3)
Sodium: 140 mmol/L (ref 135–146)

## 2019-09-20 ENCOUNTER — Other Ambulatory Visit: Payer: Self-pay | Admitting: Family Medicine

## 2019-09-20 DIAGNOSIS — I10 Essential (primary) hypertension: Secondary | ICD-10-CM

## 2019-10-11 ENCOUNTER — Other Ambulatory Visit: Payer: Self-pay

## 2019-10-11 ENCOUNTER — Emergency Department
Admission: EM | Admit: 2019-10-11 | Discharge: 2019-10-11 | Disposition: A | Discharge status: Home / Self Care | Payer: Medicare Other | Attending: Emergency Medicine | Admitting: Emergency Medicine

## 2019-10-11 ENCOUNTER — Emergency Department: Payer: Medicare Other

## 2019-10-11 DIAGNOSIS — Z79899 Other long term (current) drug therapy: Secondary | ICD-10-CM | POA: Diagnosis not present

## 2019-10-11 DIAGNOSIS — Z85048 Personal history of other malignant neoplasm of rectum, rectosigmoid junction, and anus: Secondary | ICD-10-CM | POA: Diagnosis not present

## 2019-10-11 DIAGNOSIS — R55 Syncope and collapse: Secondary | ICD-10-CM | POA: Insufficient documentation

## 2019-10-11 DIAGNOSIS — R1031 Right lower quadrant pain: Secondary | ICD-10-CM | POA: Diagnosis not present

## 2019-10-11 DIAGNOSIS — R0602 Shortness of breath: Secondary | ICD-10-CM | POA: Diagnosis not present

## 2019-10-11 DIAGNOSIS — Z87891 Personal history of nicotine dependence: Secondary | ICD-10-CM | POA: Insufficient documentation

## 2019-10-11 DIAGNOSIS — Z85038 Personal history of other malignant neoplasm of large intestine: Secondary | ICD-10-CM | POA: Diagnosis not present

## 2019-10-11 DIAGNOSIS — I10 Essential (primary) hypertension: Secondary | ICD-10-CM | POA: Diagnosis not present

## 2019-10-11 DIAGNOSIS — N390 Urinary tract infection, site not specified: Secondary | ICD-10-CM | POA: Insufficient documentation

## 2019-10-11 DIAGNOSIS — R1111 Vomiting without nausea: Secondary | ICD-10-CM | POA: Diagnosis not present

## 2019-10-11 DIAGNOSIS — R109 Unspecified abdominal pain: Secondary | ICD-10-CM | POA: Diagnosis not present

## 2019-10-11 DIAGNOSIS — R0902 Hypoxemia: Secondary | ICD-10-CM | POA: Diagnosis not present

## 2019-10-11 DIAGNOSIS — R52 Pain, unspecified: Secondary | ICD-10-CM | POA: Diagnosis not present

## 2019-10-11 LAB — COMPREHENSIVE METABOLIC PANEL
ALT: 25 U/L (ref 0–44)
AST: 29 U/L (ref 15–41)
Albumin: 3.6 g/dL (ref 3.5–5.0)
Alkaline Phosphatase: 239 U/L — ABNORMAL HIGH (ref 38–126)
Anion gap: 9 (ref 5–15)
BUN: 18 mg/dL (ref 8–23)
CO2: 23 mmol/L (ref 22–32)
Calcium: 9 mg/dL (ref 8.9–10.3)
Chloride: 103 mmol/L (ref 98–111)
Creatinine, Ser: 0.95 mg/dL (ref 0.44–1.00)
GFR calc Af Amer: >60 mL/min (ref >60)
GFR calc non Af Amer: 59 mL/min — ABNORMAL LOW (ref >60)
Glucose, Bld: 110 mg/dL — ABNORMAL HIGH (ref 70–99)
Potassium: 3.9 mmol/L (ref 3.5–5.1)
Sodium: 135 mmol/L (ref 135–145)
Total Bilirubin: 1 mg/dL (ref 0.3–1.2)
Total Protein: 7.8 g/dL (ref 6.5–8.1)

## 2019-10-11 LAB — URINALYSIS, COMPLETE (UACMP) WITH MICROSCOPIC
Bilirubin Urine: NEGATIVE
Glucose, UA: NEGATIVE mg/dL
Ketones, ur: NEGATIVE mg/dL
Nitrite: POSITIVE — AB
Protein, ur: 30 mg/dL — AB
Specific Gravity, Urine: 1.015 (ref 1.005–1.030)
WBC, UA: >50 WBC/hpf — ABNORMAL HIGH (ref 0–5)
pH: 5 (ref 5.0–8.0)

## 2019-10-11 LAB — CBC
HCT: 37.1 % (ref 36.0–46.0)
Hemoglobin: 12.5 g/dL (ref 12.0–15.0)
MCH: 29.8 pg (ref 26.0–34.0)
MCHC: 33.7 g/dL (ref 30.0–36.0)
MCV: 88.5 fL (ref 80.0–100.0)
Platelets: 169 10*3/uL (ref 150–400)
RBC: 4.19 MIL/uL (ref 3.87–5.11)
RDW: 12.7 % (ref 11.5–15.5)
WBC: 13.1 10*3/uL — ABNORMAL HIGH (ref 4.0–10.5)
nRBC: 0 % (ref 0.0–0.2)

## 2019-10-11 LAB — LIPASE, BLOOD: Lipase: 34 U/L (ref 11–51)

## 2019-10-11 IMAGING — CR DG CHEST 2V
1 series · 2 of 2 positions shown · non-contrast
Comparison: [DATE]

CLINICAL DATA: Shortness of breath.  History of rectal carcinoma

EXAM:
CHEST - 2 VIEW

[Series 1: dg chest 2 view · 0.14mm/px · 2 of 2 slices shown]
[im 1/2]
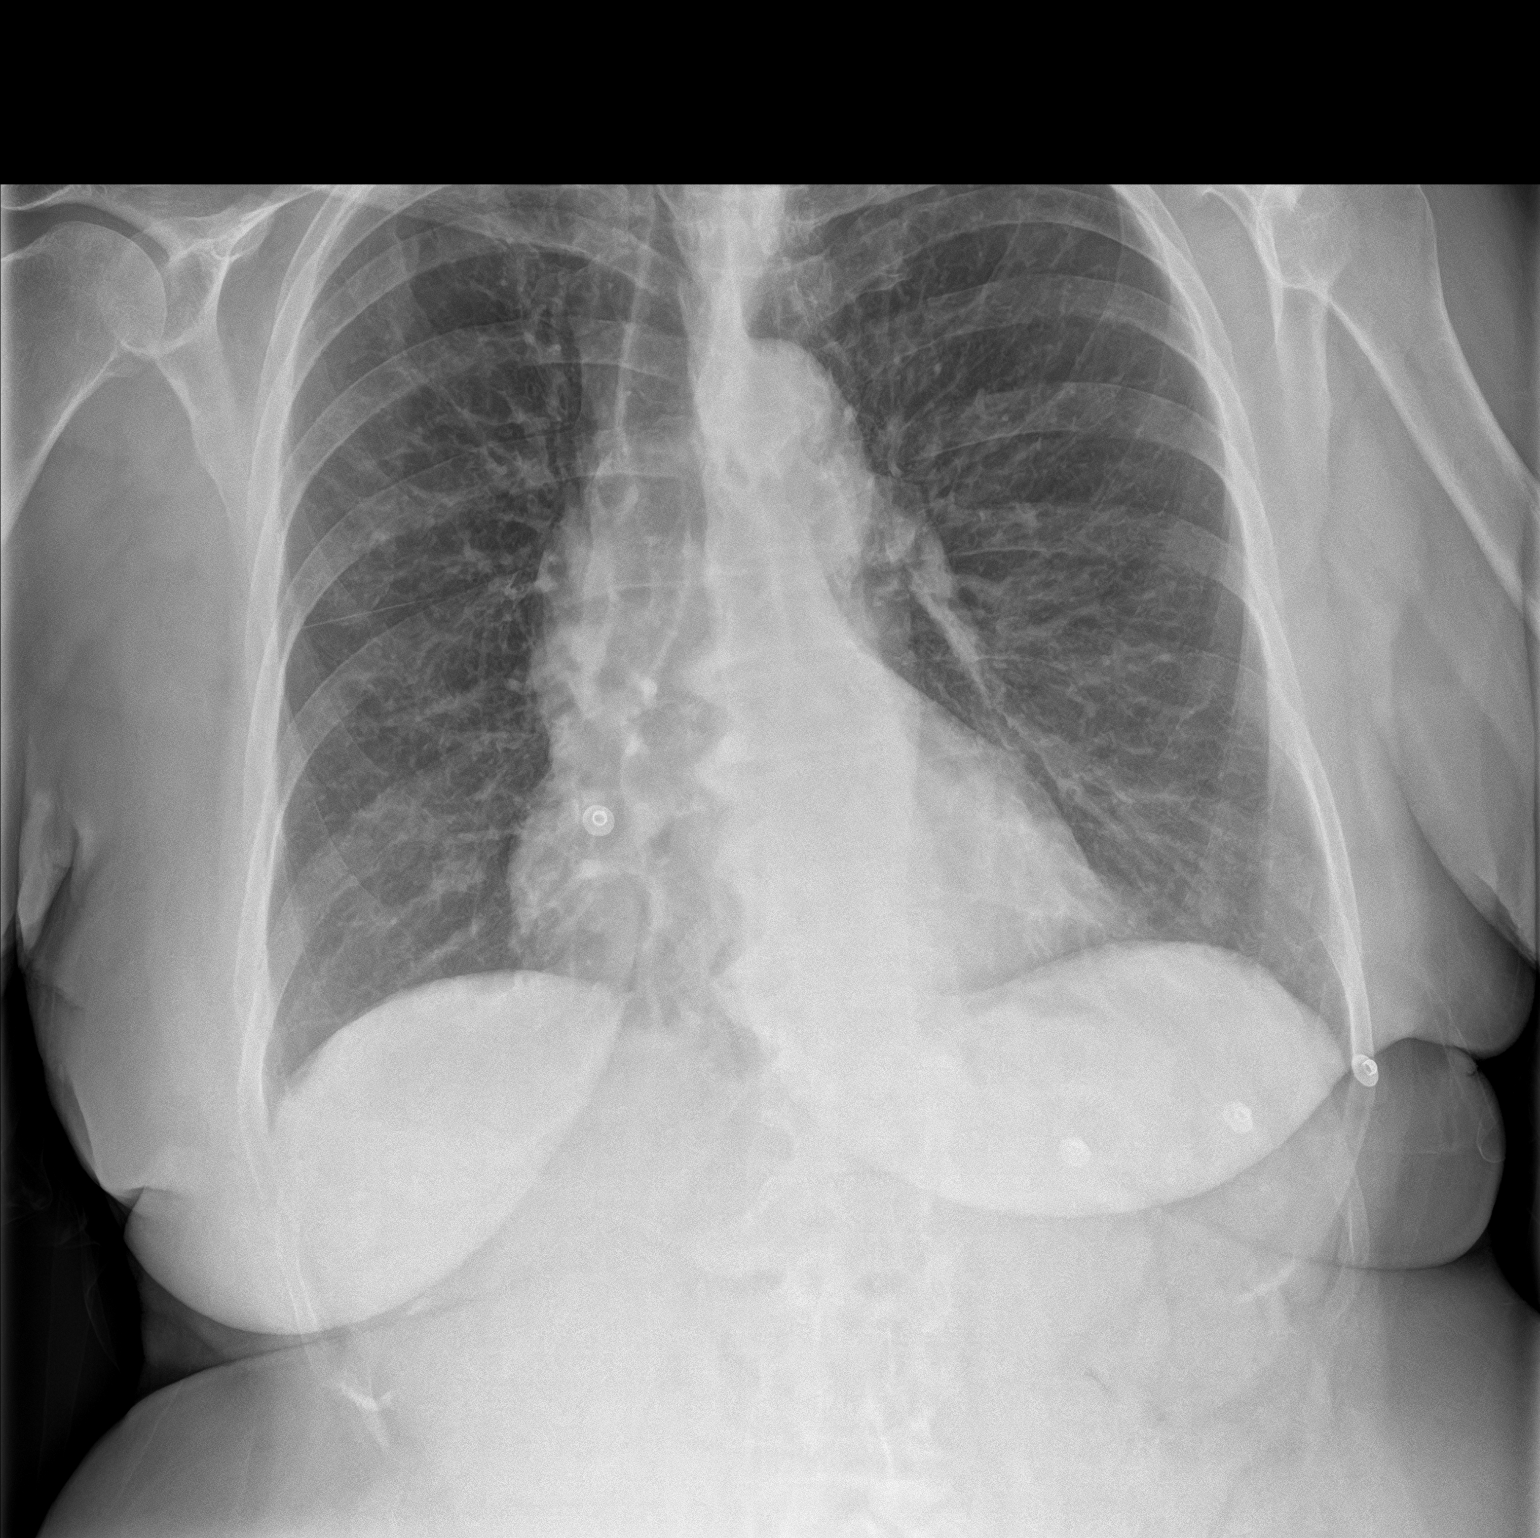
[im 2/2]
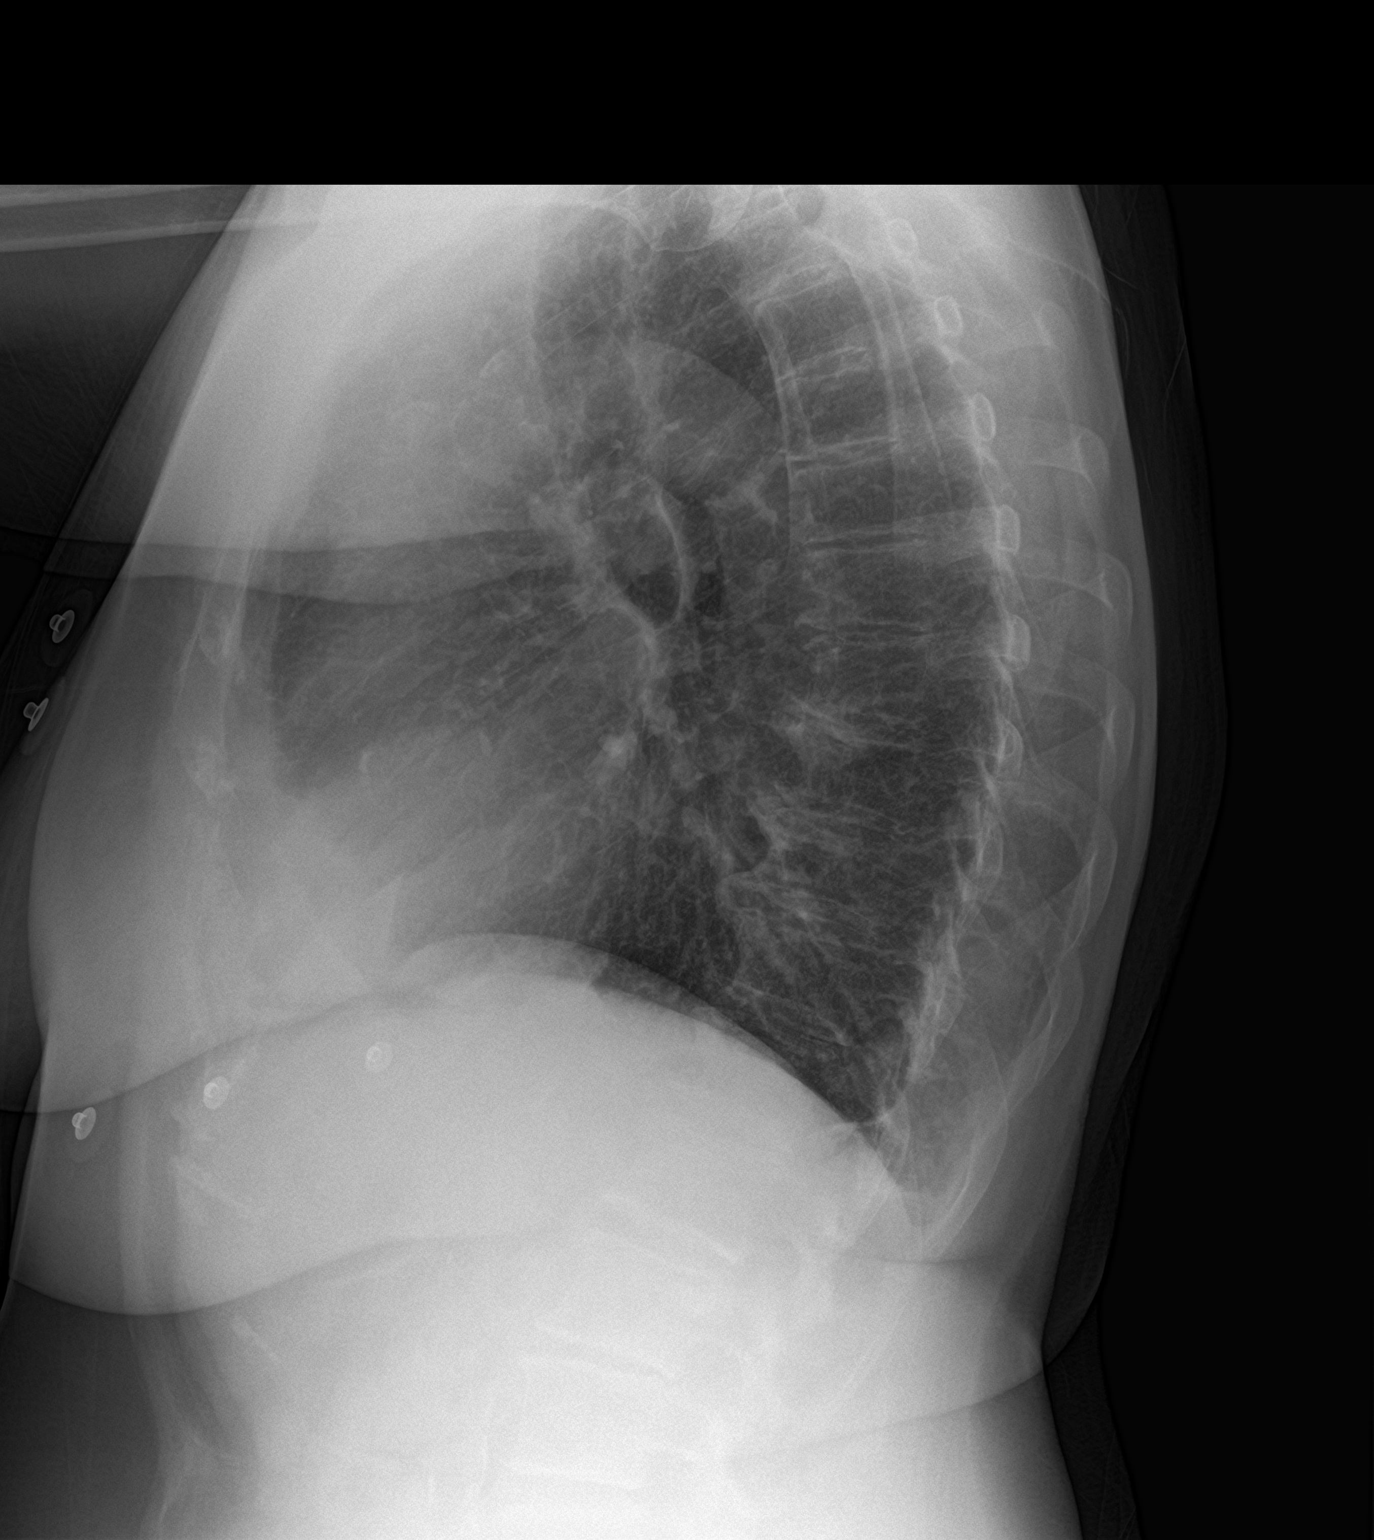

[2 of 2 positions shown; findings below may reference images not displayed]

FINDINGS: Lungs are clear. Heart size and pulmonary vascularity are normal. No
adenopathy. There is degenerative change in the thoracic spine.
IMPRESSION: No active cardiopulmonary disease. Lungs clear. Cardiac silhouette
within normal limits. No evident adenopathy.

## 2019-10-11 IMAGING — CT CT ABD-PELV W/ CM
2 of 5 series · 16 of 46 positions shown, 18 images · IV contrast (APPLIED)
Comparison: [DATE].

CLINICAL DATA: Acute right lower quadrant abdominal pain.

EXAM:
CT ABDOMEN AND PELVIS WITH CONTRAST
TECHNIQUE: Multidetector CT imaging of the abdomen and pelvis was performed
using the standard protocol following bolus administration of
intravenous contrast.
CONTRAST:  100mL OMNIPAQUE IOHEXOL 300 MG/ML  SOLN

[Series 2: axial st · axial · 0.89mm/px · z∈[-1034,-609]mm · 13 of 95 slices shown, 15 images]
[im 5/95  soft-tissue]
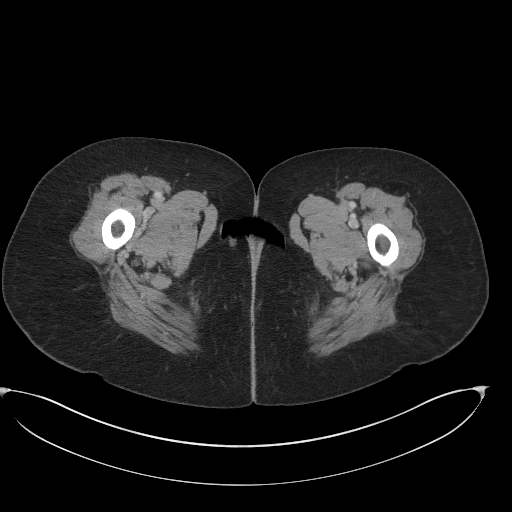
[im 5/95  bone]
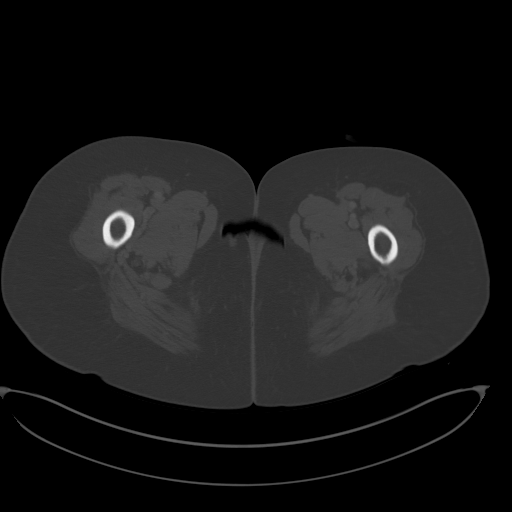
[im 15/95  soft-tissue]
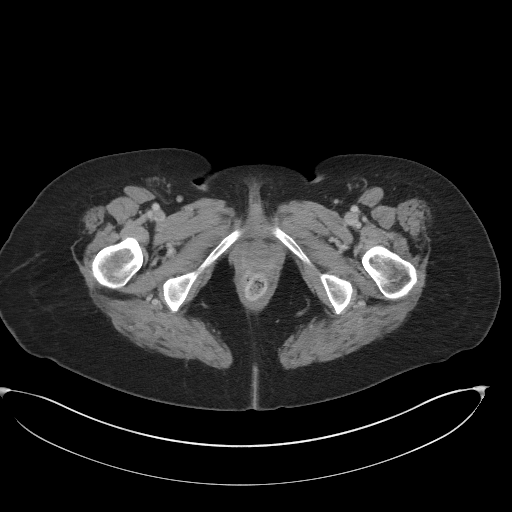
[im 20/95  soft-tissue]
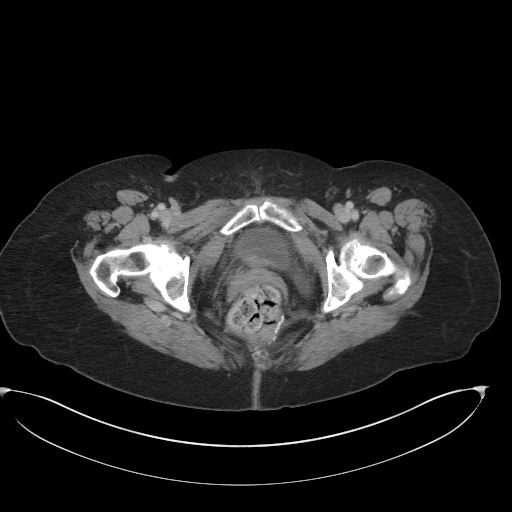
[im 25/95  soft-tissue]
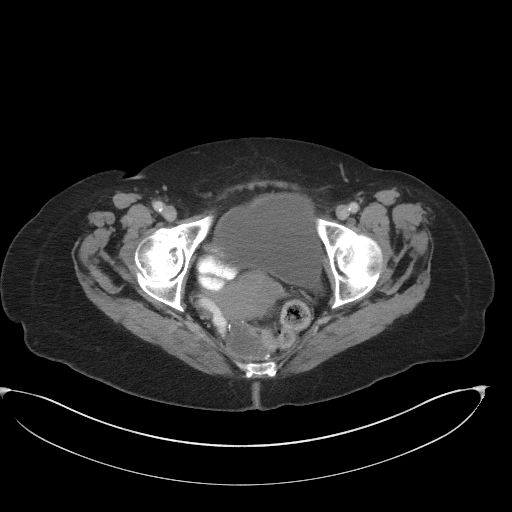
[im 35/95  soft-tissue]
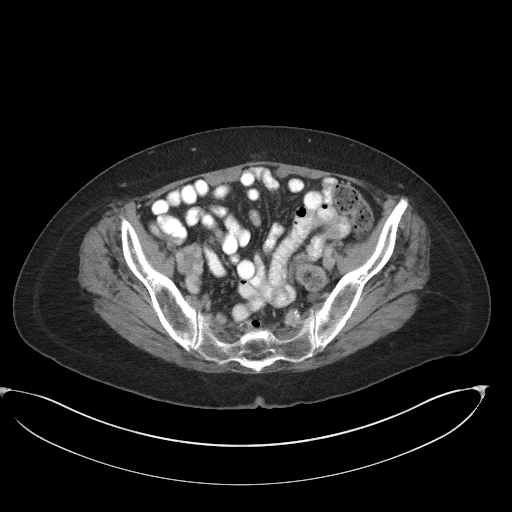
[im 40/95  soft-tissue]
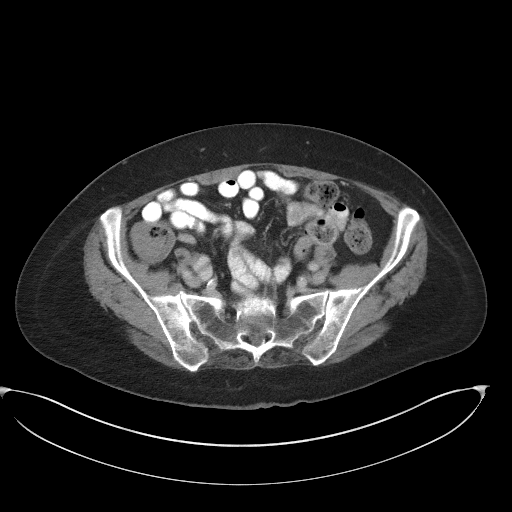
[im 50/95  soft-tissue]
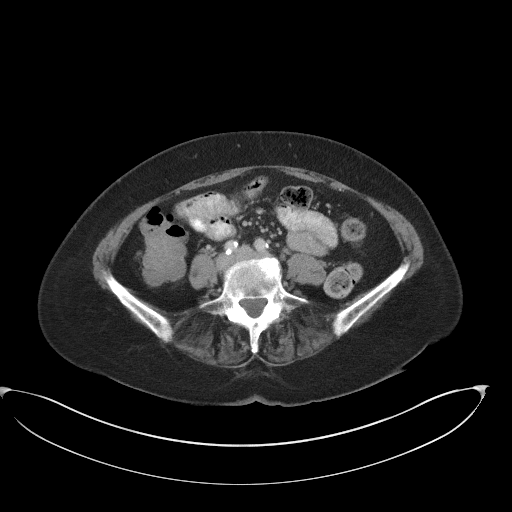
[im 55/95  soft-tissue]
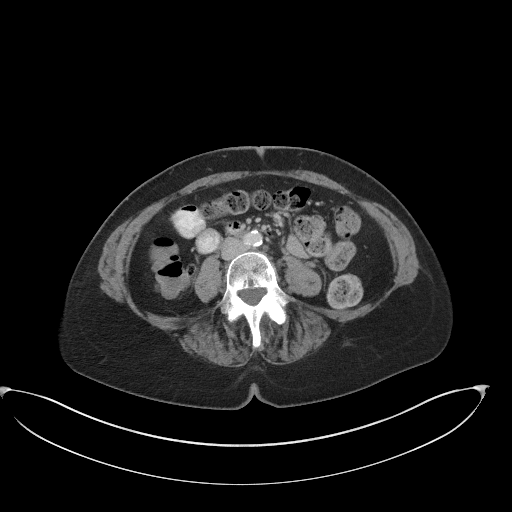
[im 60/95  soft-tissue]
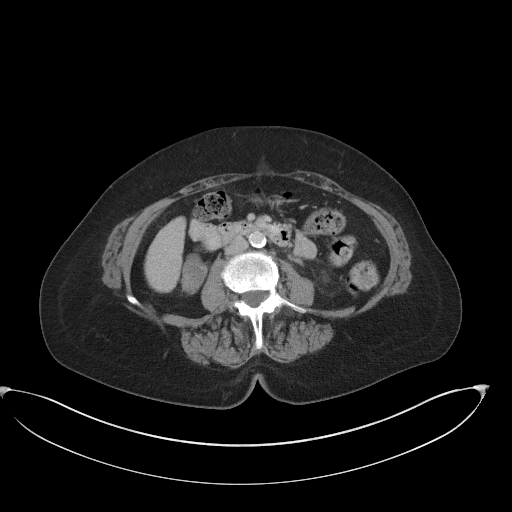
[im 60/95  bone]
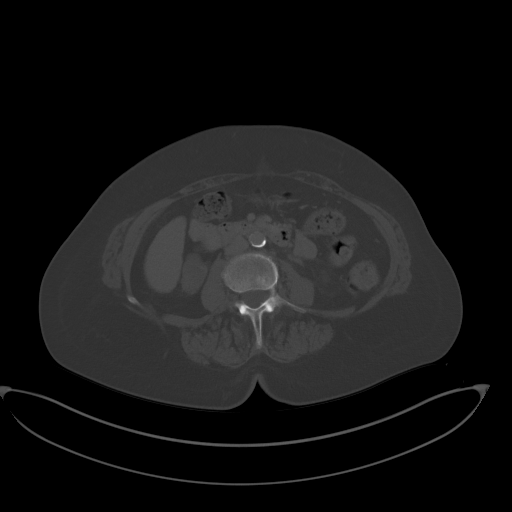
[im 70/95  soft-tissue]
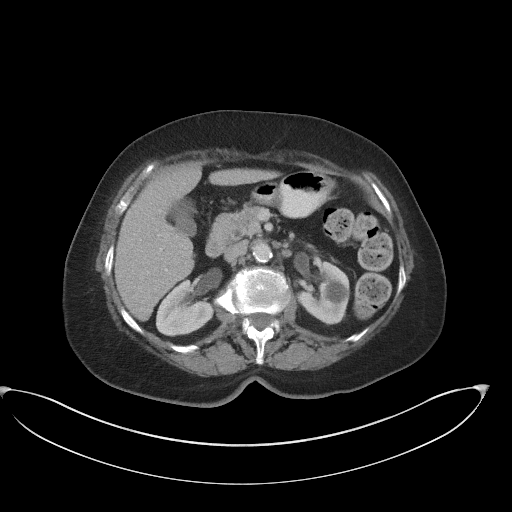
[im 75/95  soft-tissue]
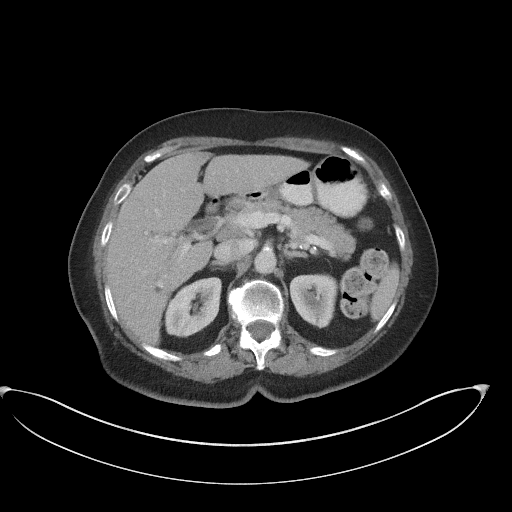
[im 80/95  soft-tissue]
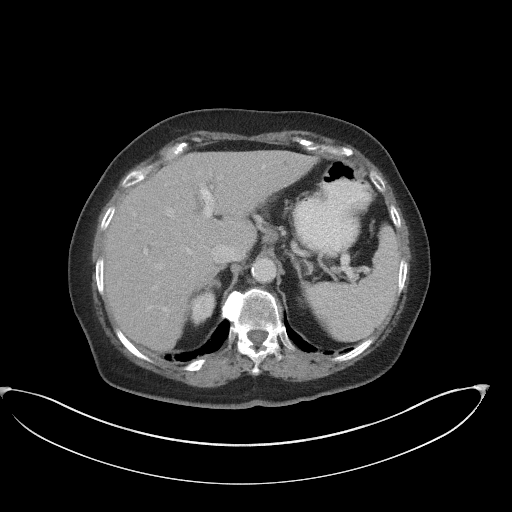
[im 90/95  soft-tissue]
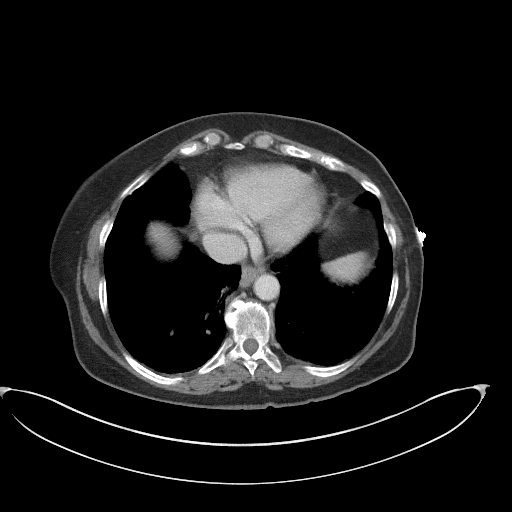

[Series 5: coronal st · coronal · 0.75mm/px · 3 of 82 slices shown]
[im 28/82  soft-tissue]
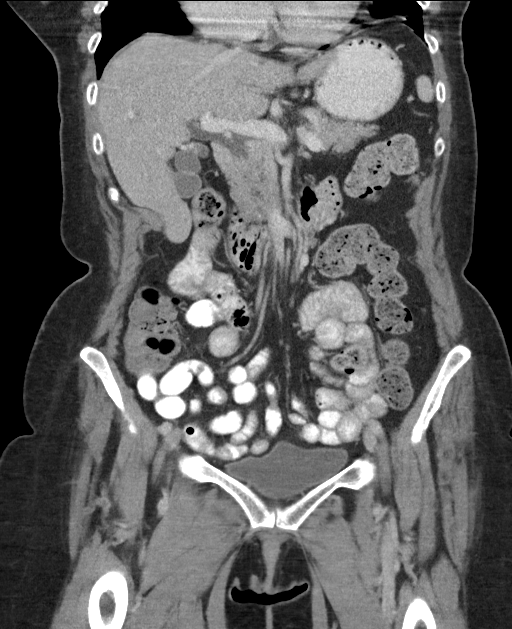
[im 37/82  soft-tissue]
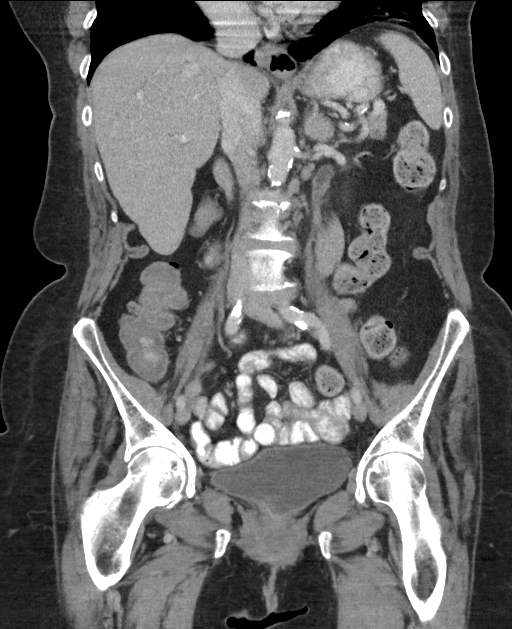
[im 46/82  soft-tissue]
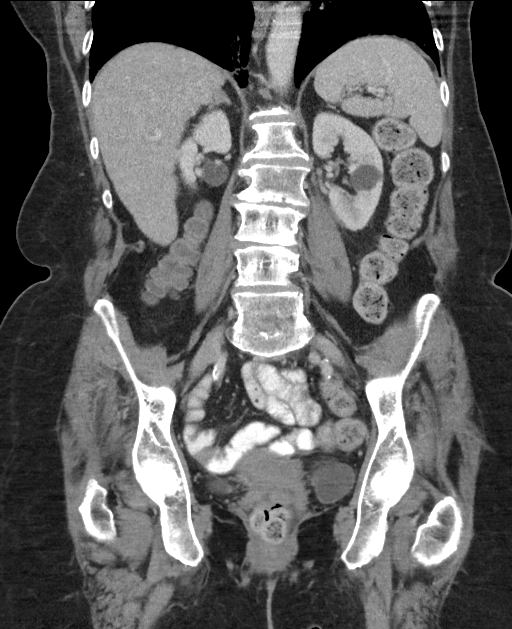

[16 of 46 positions shown; findings below may reference images not displayed]

FINDINGS: Lower chest: No acute abnormality.

Hepatobiliary: No gallstones or biliary dilatation is noted. Hepatic
cysts are noted.

Pancreas: Unremarkable. No pancreatic ductal dilatation or
surrounding inflammatory changes.

Spleen: Normal in size without focal abnormality.

Adrenals/Urinary Tract: Adrenal glands appear normal. Small
nonobstructive calculus is seen in the lower pole collecting system
of the right kidney. Left renal cyst is noted. No definite
hydronephrosis or renal obstruction is noted. Urinary bladder is
unremarkable.

Stomach/Bowel: Status post appendectomy. The stomach appears normal.
Postsurgical changes are seen involving the rectum. No definite
evidence of bowel obstruction or inflammation.

Vascular/Lymphatic: Aortic atherosclerosis. No enlarged abdominal or
pelvic lymph nodes.

Reproductive: Uterus and bilateral adnexa are unremarkable.

Other: 4.5 x 3.2 cm low density is noted in the presacral area
adjacent to the surgical anastomotic site of the rectum, which
demonstrates peripheral calcifications and may represent
postoperative seroma.

Musculoskeletal: No acute or significant osseous findings.
IMPRESSION: 1. Small nonobstructive right renal calculus. No definite
hydronephrosis or renal obstruction is noted.
2. 4.5 x 3.2 cm low density is noted in the presacral area adjacent
to the surgical anastomotic site of the rectum, which demonstrates
peripheral calcifications and may represent postoperative seroma.

Aortic Atherosclerosis ([J3]-[J3]).

## 2019-10-11 MED ORDER — SODIUM CHLORIDE 0.9 % IV SOLN
1.0000 g | Freq: Once | INTRAVENOUS | Status: AC
  Administered 2019-10-11: 1 g via INTRAVENOUS
  Filled 2019-10-11: qty 10

## 2019-10-11 MED ORDER — IOHEXOL 300 MG/ML  SOLN
100.0000 mL | Freq: Once | INTRAMUSCULAR | Status: AC | PRN
  Administered 2019-10-11: 100 mL via INTRAVENOUS
  Filled 2019-10-11: qty 100

## 2019-10-11 MED ORDER — IOHEXOL 9 MG/ML PO SOLN
500.0000 mL | Freq: Two times a day (BID) | ORAL | Status: DC | PRN
  Filled 2019-10-11: qty 500

## 2019-10-11 MED ORDER — SODIUM CHLORIDE 0.9% FLUSH: 3.0000 mL | Freq: Once | INTRAVENOUS | Status: DC

## 2019-10-11 MED ORDER — CEPHALEXIN 500 MG PO CAPS
500.0000 mg | ORAL_CAPSULE | Freq: Two times a day (BID) | ORAL | 0 refills | Status: AC
Start: 2019-10-11 — End: 2019-10-21

## 2019-10-11 NOTE — ED Notes (Addendum)
Pt refused EKG stating "I just had one before I came in this room and they said it was fine. I don't see a need for another one." Stephiane, RN made aware.

## 2019-10-11 NOTE — ED Triage Notes (Signed)
Pt c/o of RLQ pain for the past several weeks and thought it was just a pulled muscle, pt c/o having vomiting today,denies diarrhea. States she has a hx of colon CA. States she had a syncopal episode while vomiting today. Having some SOB and is currently on 2L [place] that EMS placed her on. Pt is a/ox3 on arrival. States she had the [place] and [place] covid vaccine on Friday,

## 2019-10-11 NOTE — ED Provider Notes (Signed)
[facility] Emergency Department Provider Note  Time seen: 7:05 PM  I have reviewed the triage vital signs and the nursing notes.   HISTORY  Chief Complaint Abdominal Pain   HPI Vanessa Reynolds is a 74 y.o. female with a past medical history of hypertension, past history of colon cancer, presents to the emergency department for right lower quadrant abdominal pain.  According to the patient for the past 2 months she has been experiencing right lower quadrant abdominal pain.  States the pain was somewhat worse today causing her to feel nauseated and vomit.  While vomiting patient had a brief syncopal event.  Patient denies any chest pain or shortness of breath.  Currently states she is feeling much better but continues have right lower quadrant abdominal pain.  Denies any fever.  Denies dysuria or hematuria.  No history of kidney stones.  Past Medical History:  Diagnosis Date  . Abdominal pain, left lower quadrant 2012  . Breast screening, unspecified   . H/O cystitis 2011  . Hypertension   . Nausea with vomiting   . Personal history of malignant neoplasm of large intestine 2013  . Personal history of tobacco use, presenting hazards to health   . 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    Patient Active Problem List   Diagnosis Date Noted  . Osteopenia of left forearm 07/06/2018  . Overweight (BMI 25.0-29.9) 12/29/2017  . Allergic rhinitis due to allergen 09/16/2016  . Environmental and seasonal allergies 09/16/2016  . Essential hypertension 02/20/2015  . Hyperlipidemia 02/20/2015  . History of rectal cancer 03/14/2014  . Personal history of malignant neoplasm of large intestine     Past Surgical History:  Procedure Laterality Date  . APPENDECTOMY  December 2011  . BILATERAL OOPHORECTOMY  December 2011   Completed at time of low anterior resection.  . COLON SURGERY  03/14/10   lap assisted low anterior  resection-adenocarcinoma of the rectum and a villous adenoma  . COLONOSCOPY  2011,2012,2015   Dr. Vira Agar, Dr. Addison Lank)    Prior to Admission medications   Medication Sig Start Date End Date Taking? Authorizing Provider  amLODipine (NORVASC) 2.5 MG tablet TAKE 1 TABLET BY MOUTH ONCE DAILY 09/20/19   Parks Ranger, Devonne Doughty, DO  cholecalciferol (VITAMIN D3) 25 MCG (1000 UT) tablet Take 1,000 Units by mouth daily.    [provider]  fluticasone (FLONASE) 50 MCG/ACT nasal spray Place 2 sprays into both nostrils daily. Use for 4-6 weeks then stop and use seasonally or as needed. 08/05/19   Karamalegos, Devonne Doughty, DO  lisinopril (ZESTRIL) 40 MG tablet Take 0.5 tablets (20 mg total) by mouth daily. 08/18/19   Karamalegos, Devonne Doughty, DO  loratadine (CLARITIN) 10 MG tablet Take 1 tablet (10 mg total) by mouth daily. Use for 4-6 weeks then stop, and use as needed or seasonally 08/05/19   Parks Ranger, Devonne Doughty, DO  lovastatin (MEVACOR) 20 MG tablet Take 1 tablet (20 mg total) by mouth at bedtime. 08/05/19   Karamalegos, Devonne Doughty, DO  Multiple Vitamins-Minerals (ZINC PO) Take by mouth daily.    [provider]  Probiotic Product (PROBIOTIC DAILY PO) Take 1 tablet by mouth daily.    [provider]  vitamin B-12 (CYANOCOBALAMIN) 1000 MCG tablet Take 1,000 mcg by mouth daily.    [provider]    Allergies  Allergen Reactions  . Fish Oil Other (See Comments)     Balance  was off    Family History  Problem Relation Age of Onset  . Hypertension Mother        deceased, age 77  . Cancer Father        gastric cancer, deceased, mid 80's  . Breast cancer Neg Hx     Social History Social History   Tobacco Use  . Smoking status: Former Smoker    Packs/day: 1.00    Years: 30.00    Pack years: 30.00    Types: Cigarettes    Quit date: 05/20/1997    Years since quitting: 22.4  . Smokeless tobacco: Never Used  . Tobacco comment: quit in 1999  Substance  Use Topics  . Alcohol use: No  . Drug use: No    Review of Systems Constitutional: Negative for fever. Cardiovascular: Negative for chest pain. Respiratory: Negative for shortness of breath. Gastrointestinal: Right lower quadrant abdominal pain.  Positive for nausea vomiting. Genitourinary: Negative for urinary compaints Musculoskeletal: Negative for musculoskeletal complaints Neurological: Negative for headache All other ROS negative  ____________________________________________   PHYSICAL EXAM:  VITAL SIGNS: ED Triage Vitals  Enc Vitals Group     BP 10/11/19 1452 125/67     Pulse Rate 10/11/19 1452 72     Resp 10/11/19 1452 18     Temp 10/11/19 1452 99.1 F (37.3 C)     Temp Source 10/11/19 1452 Oral     SpO2 10/11/19 1452 95 %     Weight 10/11/19 1454 165 lb (74.8 kg)     Height 10/11/19 1454 5\' 4"  (1.626 m)     Head Circumference --      Peak Flow --      Pain Score 10/11/19 1454 0     Pain Loc --      Pain Edu? --      Excl. in [place]? --     Constitutional: Alert and oriented. Well appearing and in no distress. Eyes: Normal exam ENT      Head: Normocephalic and atraumatic.      Mouth/Throat: Mucous membranes are moist. Cardiovascular: Normal rate, regular rhythm.  Respiratory: Normal respiratory effort without tachypnea nor retractions. Breath sounds are clear  Gastrointestinal: Soft, mild right lower quadrant abdominal tenderness palpation.  No rebound guarding or distention.  Otherwise benign abdomen. Musculoskeletal: Nontender with normal range of motion in all extremities.  Neurologic:  Normal speech and language. No gross focal neurologic deficits  Skin:  Skin is warm, dry and intact.  Psychiatric: Mood and affect are normal.  ____________________________________________    EKG  Refused  ____________________________________________    RADIOLOGY  X-ray shows no acute findings.  CT scan negative for acute  abnormality.  ____________________________________________   INITIAL IMPRESSION / ASSESSMENT AND PLAN / ED COURSE  Pertinent labs & imaging results that were available during my care of the patient were reviewed by me and considered in my medical decision making (see chart for details).   Patient presents emergency department for right lower quadrant abdominal pain and a brief syncopal event while vomiting.  Overall the patient appears well, no distress.  Does have mild right lower quadrant abdominal tenderness palpation.  Patient is status post appendectomy.  Differential would still include ureterolithiasis, pyelonephritis, stump appendicitis, colitis or diverticulitis or oncologic process.  We will proceed with a CT scan of the abdomen/pelvis to further evaluate.  Patient agreeable to plan.  Patient's labs overall reassuring besides a mild leukocytosis.  CT scan is negative for acute abnormality.  Patient's  urinalysis appears consistent with a urinary tract infection.  Patient received IV Rocephin in the emergency department.  We will discharge with Keflex and have the patient follow-up with her doctor.  I discussed return precautions for any lightheadedness, syncope, fever or worsening pain.  Vanessa Reynolds was evaluated in Emergency Department on 10/11/2019 for the symptoms described in the history of present illness. She was evaluated in the context of the global COVID-19 pandemic, which necessitated consideration that the patient might be at risk for infection with the SARS-CoV-2 virus that causes COVID-19. Institutional protocols and algorithms that pertain to the evaluation of patients at risk for COVID-19 are in a state of rapid change based on information released by regulatory bodies including the CDC and federal and state organizations. These policies and algorithms were followed during the patient's care in the ED.  ____________________________________________   FINAL CLINICAL  IMPRESSION(S) / ED DIAGNOSES  Right lower quadrant abdominal pain Urinary tract infection   Harvest Dark, MD 10/11/19 2107

## 2019-10-11 NOTE — ED Triage Notes (Addendum)
Pt comes into the ED via EMS from home with c/o abd pain, vomiting today. 89-90% on RA, placed  On 2L [place] and now 95%

## 2019-10-11 NOTE — ED Notes (Signed)
During triage pt was tested without O2, sats maintained between 93-95% on RA.Marland Kitchen

## 2019-10-13 ENCOUNTER — Other Ambulatory Visit: Payer: Self-pay

## 2019-10-13 ENCOUNTER — Ambulatory Visit (INDEPENDENT_AMBULATORY_CARE_PROVIDER_SITE_OTHER): Payer: Medicare Other | Admitting: Family Medicine

## 2019-10-13 ENCOUNTER — Encounter: Payer: Self-pay | Admitting: Family Medicine

## 2019-10-13 VITALS — BP 97/50 | HR 65 | Temp 97.5°F | Resp 16 | Ht 64.0 in | Wt 166.5 lb

## 2019-10-13 DIAGNOSIS — K5900 Constipation, unspecified: Secondary | ICD-10-CM

## 2019-10-13 DIAGNOSIS — B9620 Unspecified Escherichia coli [E. coli] as the cause of diseases classified elsewhere: Secondary | ICD-10-CM | POA: Diagnosis not present

## 2019-10-13 DIAGNOSIS — N390 Urinary tract infection, site not specified: Secondary | ICD-10-CM

## 2019-10-13 DIAGNOSIS — R55 Syncope and collapse: Secondary | ICD-10-CM | POA: Diagnosis not present

## 2019-10-13 NOTE — Progress Notes (Signed)
Subjective:    Patient ID: Vanessa Reynolds, female    DOB: 12/21/1945, 74 y.o.   MRN: [id number]  Vanessa Reynolds is a 74 y.o. female presenting on 10/13/2019 for Hospitalization Follow-up (syncope )   HPI  ED FOLLOW-UP VISIT  Hospital/Location: [facility] Date of ED Visit: 10/11/19  Reason for Presenting to ED: Syncope vs Pre-syncopal episode RLQ, nausea vomiting   FOLLOW-UP  - ED provider note and record have been reviewed - Patient presents today about 2 days after recent ED visit. Brief summary of recent course, patient had symptoms of abdominal pain RLQ x 2 months nausea vomiting, presented to ED on 5/24 she had near syncopal vs syncopal episode after nausea vomiting episode and her husband helped her to the ground and helped with a modified heimlich maneuver to help any remaining vomit come out, she started to come to and, and then went to ED testing in ED with CXR labs WBC elevated, glucose elevated, CT abdomen with no acute issues, urinalysis and urine culture showed E Coli treated with Ceftriaxone.  - Today reports overall has done well after discharge from ED. Symptoms are improved on Keflex antibiotic now. No urinary symptoms. No further syncopal or presyncopal events - Admits constipation. Not taking any stool softener or fiber. She plans to get metamucil.  PMH rectal cancer, has not returned to her Gen Surgery. She does not have GI specialist.  - New medications on discharge: Keflex   I have reviewed the discharge medication list, and have reconciled the current and discharge medications today.   I have reviewed the discharge medication list, and have reconciled the current and discharge medications today.   Current Outpatient Medications:  .  amLODipine (NORVASC) 2.5 MG tablet, TAKE 1 TABLET BY MOUTH ONCE DAILY, Disp: 60 tablet, Rfl: 0 .  cephALEXin (KEFLEX) 500 MG capsule, Take 1 capsule (500 mg total) by mouth 2 (two) times daily for 10 days., Disp: 20 capsule, Rfl: 0 .   cholecalciferol (VITAMIN D3) 25 MCG (1000 UT) tablet, Take 1,000 Units by mouth daily., Disp: , Rfl:  .  fluticasone (FLONASE) 50 MCG/ACT nasal spray, Place 2 sprays into both nostrils daily. Use for 4-6 weeks then stop and use seasonally or as needed., Disp: 48 g, Rfl: 1 .  lisinopril (ZESTRIL) 40 MG tablet, Take 0.5 tablets (20 mg total) by mouth daily., Disp: 45 tablet, Rfl: 1 .  loratadine (CLARITIN) 10 MG tablet, Take 1 tablet (10 mg total) by mouth daily. Use for 4-6 weeks then stop, and use as needed or seasonally, Disp: 90 tablet, Rfl: 1 .  lovastatin (MEVACOR) 20 MG tablet, Take 1 tablet (20 mg total) by mouth at bedtime., Disp: 90 tablet, Rfl: 3 .  Multiple Vitamins-Minerals (ZINC PO), Take by mouth daily., Disp: , Rfl:  .  Probiotic Product (PROBIOTIC DAILY PO), Take 1 tablet by mouth daily., Disp: , Rfl:  .  vitamin B-12 (CYANOCOBALAMIN) 1000 MCG tablet, Take 1,000 mcg by mouth daily., Disp: , Rfl:   ------------------------------------------------------------------------- Social History   Tobacco Use  . Smoking status: Former Smoker    Packs/day: 1.00    Years: 30.00    Pack years: 30.00    Types: Cigarettes    Quit date: 05/20/1997    Years since quitting: 22.4  . Smokeless tobacco: Former Systems developer  . Tobacco comment: quit in 1999  Substance Use Topics  . Alcohol use: No  . Drug use: No    Review of Systems Per HPI unless  specifically indicated above     Objective:    BP (!) 97/50   Pulse 65   Temp (!) 97.5 F (36.4 C) (Temporal)   Resp 16   Ht 5\' 4"  (1.626 m)   Wt 166 lb 8 oz (75.5 kg)   SpO2 96%   BMI 28.58 kg/m   Wt Readings from Last 3 Encounters:  10/13/19 166 lb 8 oz (75.5 kg)  10/11/19 165 lb (74.8 kg)  08/17/19 164 lb 3.2 oz (74.5 kg)    Physical Exam Vitals and nursing note reviewed.  Constitutional:      General: She is not in acute distress.    Appearance: She is well-developed. She is not diaphoretic.     Comments: Well-appearing, comfortable,  cooperative  HENT:     Head: Normocephalic and atraumatic.  Eyes:     General:        Right eye: No discharge.        Left eye: No discharge.     Conjunctiva/sclera: Conjunctivae normal.  Neck:     Thyroid: No thyromegaly.  Cardiovascular:     Rate and Rhythm: Normal rate and regular rhythm.     Heart sounds: Normal heart sounds. No murmur.  Pulmonary:     Effort: Pulmonary effort is normal. No respiratory distress.     Breath sounds: Normal breath sounds. No wheezing or rales.  Abdominal:     General: There is no distension.     Palpations: Abdomen is soft.     Tenderness: There is no abdominal tenderness.     Comments: Reduced bowel sounds  Musculoskeletal:        General: Normal range of motion.     Cervical back: Normal range of motion and neck supple.  Lymphadenopathy:     Cervical: No cervical adenopathy.  Skin:    General: Skin is warm and dry.     Findings: No erythema or rash.  Neurological:     General: No focal deficit present.     Mental Status: She is alert and oriented to person, place, and time. Mental status is at baseline.     Cranial Nerves: No cranial nerve deficit.     Sensory: No sensory deficit.     Motor: No weakness.  Psychiatric:        Behavior: Behavior normal.     Comments: Well groomed, good eye contact, normal speech and thoughts      DG Chest 2 ViewPerformed 10/11/2019 Final result  Study Result CLINICAL DATA: Shortness of breath. History of rectal carcinoma  EXAM: CHEST - 2 VIEW  COMPARISON: June 07, 2019  FINDINGS: Lungs are clear. Heart size and pulmonary vascularity are normal. No adenopathy. There is degenerative change in the thoracic spine.  IMPRESSION: No active cardiopulmonary disease. Lungs clear. Cardiac silhouette within normal limits. No evident adenopathy.   Electronically Signed By: Lowella Grip III M.D. On: 10/11/2019 15:31 --------------------------------------  CT ABDOMEN PELVIS W CONTRASTPerformed  10/11/2019 Final result  Study Result CLINICAL DATA: Acute right lower quadrant abdominal pain.  EXAM: CT ABDOMEN AND PELVIS WITH CONTRAST  TECHNIQUE: Multidetector CT imaging of the abdomen and pelvis was performed using the standard protocol following bolus administration of intravenous contrast.  CONTRAST: 168mL OMNIPAQUE IOHEXOL 300 MG/ML SOLN  COMPARISON: April 03, 2010.  FINDINGS: Lower chest: No acute abnormality.  Hepatobiliary: No gallstones or biliary dilatation is noted. Hepatic cysts are noted.  Pancreas: Unremarkable. No pancreatic ductal dilatation or surrounding inflammatory changes.  Spleen: Normal in size  without focal abnormality.  Adrenals/Urinary Tract: Adrenal glands appear normal. Small nonobstructive calculus is seen in the lower pole collecting system of the right kidney. Left renal cyst is noted. No definite hydronephrosis or renal obstruction is noted. Urinary bladder is unremarkable.  Stomach/Bowel: Status post appendectomy. The stomach appears normal. Postsurgical changes are seen involving the rectum. No definite evidence of bowel obstruction or inflammation.  Vascular/Lymphatic: Aortic atherosclerosis. No enlarged abdominal or pelvic lymph nodes.  Reproductive: Uterus and bilateral adnexa are unremarkable.  Other: 4.5 x 3.2 cm low density is noted in the presacral area adjacent to the surgical anastomotic site of the rectum, which demonstrates peripheral calcifications and may represent postoperative seroma.  Musculoskeletal: No acute or significant osseous findings.  IMPRESSION: 1. Small nonobstructive right renal calculus. No definite hydronephrosis or renal obstruction is noted. 2. 4.5 x 3.2 cm low density is noted in the presacral area adjacent to the surgical anastomotic site of the rectum, which demonstrates peripheral calcifications and may represent postoperative seroma.  Aortic Atherosclerosis  (ICD10-I70.0).   Electronically Signed By: Marijo Conception M.D. On: 10/11/2019 20:31     Results for orders placed or performed during the hospital encounter of 10/11/19  Urine Culture   Specimen: Urine, Random  Result Value Ref Range   Specimen Description      URINE, RANDOM Performed at [facility], [address] [place], [place] [address]    Special Requests      NONE Performed at [facility], [place], [place] [address]    Culture (A)     >=100,000 COLONIES/mL ESCHERICHIA COLI SUSCEPTIBILITIES TO FOLLOW Performed at [facility] [address] [place], [place] [address]    Report Status PENDING   Lipase, blood  Result Value Ref Range   Lipase 34 11 - 51 U/L  Comprehensive metabolic panel  Result Value Ref Range   Sodium 135 135 - 145 mmol/L   Potassium 3.9 3.5 - 5.1 mmol/L   Chloride 103 98 - 111 mmol/L   CO2 23 22 - 32 mmol/L   Glucose, Bld 110 (H) 70 - 99 mg/dL   BUN 18 8 - 23 mg/dL   Creatinine, Ser 0.95 0.44 - 1.00 mg/dL   Calcium 9.0 8.9 - 10.3 mg/dL   Total Protein 7.8 6.5 - 8.1 g/dL   Albumin 3.6 3.5 - 5.0 g/dL   AST 29 15 - 41 U/L   ALT 25 0 - 44 U/L   Alkaline Phosphatase 239 (H) 38 - 126 U/L   Total Bilirubin 1.0 0.3 - 1.2 mg/dL   GFR calc non Af Amer 59 (L) >60 mL/min   GFR calc Af Amer >60 >60 mL/min   Anion gap 9 5 - 15  CBC  Result Value Ref Range   WBC 13.1 (H) 4.0 - 10.5 K/uL   RBC 4.19 3.87 - 5.11 MIL/uL   Hemoglobin 12.5 12.0 - 15.0 g/dL   HCT 37.1 36.0 - 46.0 %   MCV 88.5 80.0 - 100.0 fL   MCH 29.8 26.0 - 34.0 pg   MCHC 33.7 30.0 - 36.0 g/dL   RDW 12.7 11.5 - 15.5 %   Platelets 169 150 - 400 K/uL   nRBC 0.0 0.0 - 0.2 %  Urinalysis, Complete w Microscopic  Result Value Ref Range   Color, Urine YELLOW (A) YELLOW   APPearance CLOUDY (A) CLEAR   Specific Gravity, Urine 1.015 1.005 - 1.030   pH 5.0 5.0 - 8.0  Glucose, UA NEGATIVE NEGATIVE mg/dL   Hgb urine dipstick SMALL (A) NEGATIVE    Bilirubin Urine NEGATIVE NEGATIVE   Ketones, ur NEGATIVE NEGATIVE mg/dL   Protein, ur 30 (A) NEGATIVE mg/dL   Nitrite POSITIVE (A) NEGATIVE   Leukocytes,Ua MODERATE (A) NEGATIVE   RBC / HPF 0-5 0 - 5 RBC/hpf   WBC, UA >50 (H) 0 - 5 WBC/hpf   Bacteria, UA RARE (A) NONE SEEN   Squamous Epithelial / LPF 0-5 0 - 5   Mucus PRESENT    Hyaline Casts, UA PRESENT       Assessment & Plan:   Problem List Items Addressed This Visit    None    Visit Diagnoses    E. coli UTI    -  Primary   Pre-syncope       Constipation, unspecified constipation type          E Coli UTI - resolved, on oral antibiotic now May have been cause for her abdominal pain and syncopal episode  Syncope vs Pre-Syncope Reassurance, seems provoked by constellation of factors with nausea vomiting likely with illness UTI  Constipation She is having BM and gas, no evidence on CT imaging or history to suggest known partial or actual bowel obstruction  Trial on metamucil Inc fiber Add Miralax PRN only to resolve acute constipation - may use more often, vs can try Docusate colace PRN  Advised she can return to her Gen Surgery if abdominal pain persists or we can refer to GI . History of rectal cancer.  No orders of the defined types were placed in this encounter.   Follow up plan: Return if symptoms worsen or fail to improve.   Nobie Putnam, [facility] 10/13/2019, 11:05 AM

## 2019-10-13 NOTE — Patient Instructions (Addendum)
Thank you for coming to the office today.  Finish the antibiotic.  Call if need nausea medicine.  For Constipation (less frequent bowel movement that can be hard dry or involve straining).  Other more natural remedies or preventative treatment: - Increase hydration with water - Increase fiber in diet (high fiber foods = vegetables, leafy greens, oats/grains) - May take OTC Fiber supplement (metamucil powder or pill/gummy) - May try OTC Probiotic  May take an OTC Docusate (Colace) stool softener only - can be taken regularly if you prefer.  Combo treatment - with both softener and help move it along - Recommend trying OTC Miralax 17g = 1 capful in large glass water once daily for now, try several days to see if working, goal is soft stool or BM 1-2 times daily, if too loose then reduce dose or try every other day. If not effective may need to increase it to 2 doses at once in AM or may do 1 in morning and 1 in afternoon/evening  - This medicine is very safe and can be used often without any problem and will not make you dehydrated. It is good for use on AS NEEDED BASIS or even MAINTENANCE therapy for longer term for several days to weeks at a time to help regulate bowel movements  If BP stays lower - can skip Amlodipine 2.5mg  dose.   Please schedule a Follow-up Appointment to: Return if symptoms worsen or fail to improve.  If you have any other questions or concerns, please feel free to call the office or send a message through [place]. You may also schedule an earlier appointment if necessary.  Additionally, you may be receiving a survey about your experience at our office within a few days to 1 week by e-mail or mail. We value your feedback.  Nobie Putnam, DO [facility]

## 2019-10-14 LAB — URINE CULTURE: Culture: >=100000 — AB

## 2019-10-28 ENCOUNTER — Encounter: Payer: Self-pay | Admitting: Family Medicine

## 2019-10-28 ENCOUNTER — Ambulatory Visit (INDEPENDENT_AMBULATORY_CARE_PROVIDER_SITE_OTHER): Payer: Medicare Other | Admitting: Family Medicine

## 2019-10-28 ENCOUNTER — Other Ambulatory Visit: Payer: Self-pay

## 2019-10-28 VITALS — BP 103/53 | HR 65 | Temp 97.7°F | Resp 16 | Ht 64.0 in | Wt 168.0 lb

## 2019-10-28 DIAGNOSIS — R55 Syncope and collapse: Secondary | ICD-10-CM | POA: Diagnosis not present

## 2019-10-28 DIAGNOSIS — I10 Essential (primary) hypertension: Secondary | ICD-10-CM | POA: Diagnosis not present

## 2019-10-28 DIAGNOSIS — N200 Calculus of kidney: Secondary | ICD-10-CM

## 2019-10-28 DIAGNOSIS — K219 Gastro-esophageal reflux disease without esophagitis: Secondary | ICD-10-CM

## 2019-10-28 DIAGNOSIS — E782 Mixed hyperlipidemia: Secondary | ICD-10-CM

## 2019-10-28 DIAGNOSIS — Z8679 Personal history of other diseases of the circulatory system: Secondary | ICD-10-CM | POA: Diagnosis not present

## 2019-10-28 MED ORDER — LISINOPRIL 20 MG PO TABS: 20.0000 mg | ORAL_TABLET | Freq: Every day | ORAL | 1 refills | Status: DC

## 2019-10-28 MED ORDER — OMEPRAZOLE 20 MG PO CPDR: 20.0000 mg | DELAYED_RELEASE_CAPSULE | Freq: Every day | ORAL | 1 refills | Status: DC

## 2019-10-28 NOTE — Patient Instructions (Signed)
Thank you for coming to the office today.  For blood pressure.  STOP Amlodipine 2.5mg . no longer on this med.  Continue Lisinopril once daily in evening we will order 20mg  tablet now instead of 40mg . If you have elevated BP >180/100 you can take an extra 20mg  tablet if needed.  Referrals to specialist - they will call to schedule. If not heard back you can call them.  [facility]) [facility]Care at [place], [place] [address] Main: [phone]    ------------------------------------  [place] -1st floor [place],  [place]  [address] Phone: [phone]    Please schedule a Follow-up Appointment to: No follow-ups on file.  If you have any other questions or concerns, please feel free to call the office or send a message through [place]. You may also schedule an earlier appointment if necessary.  Additionally, you may be receiving a survey about your experience at our office within a few days to 1 week by e-mail or mail. We value your feedback.  Nobie Putnam, DO [facility]

## 2019-10-28 NOTE — Progress Notes (Signed)
Subjective:    Patient ID: Vanessa Reynolds, female    DOB: 03/29/1946, 74 y.o.   MRN: [id number]  Vanessa Reynolds is a 74 y.o. female presenting on 10/28/2019 for Nephrolithiasis (needs referral also as per patient wants to be seen by cardiologist for syncope episode in past) and Gastroesophageal Reflux  Accompanied by husband, Leighton Parody.  HPI   AKI, recent elevated creatinine - resolved Last lab trend 08/03/19 showed Creatinine up to 1.48 and BUN 35 Now lab updated 08/17/19 and 10/11/19 with Creatinine 0.95 improved has since resumed ACEi She has been hydrating. Not on NSAID Prior baseline Cr 0.9 to 1  CHRONIC HTN: Reports normal BP readings now, some lower readings. No further episodes of high reading has not had to take Amlodipine often. She was taking Lisinopril half tab for 20mg  dose but couldn't cut in half well so she resumed full dose 40mg  Current Meds - Lisinopril 40mg  daily, Amlodipine 2.5mg  daily PRN if > Reports good compliance, took meds today. Tolerating well, w/o complaints. Denies CP, dyspnea, HA, edema, dizziness / lightheadedness  History of Rheumatic Fever Syncopal Episode With prior hospital ED visit and HTN issues, she has concerns about requesting heart evaluation. She has remote history of Rheumatic fever as child. fam history of heart disease  GERD Often provoked by diet. Request rx PPI therapy omeprazole has tried before with good results   Depression screen [facility] 2/9 08/17/2019 08/05/2019 07/14/2018  Decreased Interest 0 0 0  Down, Depressed, Hopeless 0 0 0  PHQ - 2 Score 0 0 0    Social History   Tobacco Use  . Smoking status: Former Smoker    Packs/day: 1.00    Years: 30.00    Pack years: 30.00    Types: Cigarettes    Quit date: 05/20/1997    Years since quitting: 22.4  . Smokeless tobacco: Former Systems developer  . Tobacco comment: quit in 1999  Substance Use Topics  . Alcohol use: No  . Drug use: No    Review of Systems Per HPI unless specifically  indicated above     Objective:    BP (!) 103/53   Pulse 65   Temp 97.7 F (36.5 C) (Temporal)   Resp 16   Ht 5\' 4"  (1.626 m)   Wt 168 lb (76.2 kg)   SpO2 99%   BMI 28.84 kg/m   Wt Readings from Last 3 Encounters:  10/28/19 168 lb (76.2 kg)  10/13/19 166 lb 8 oz (75.5 kg)  10/11/19 165 lb (74.8 kg)    Physical Exam Vitals and nursing note reviewed.  Constitutional:      General: She is not in acute distress.    Appearance: She is well-developed. She is not diaphoretic.     Comments: Well-appearing, comfortable, cooperative  HENT:     Head: Normocephalic and atraumatic.  Eyes:     General:        Right eye: No discharge.        Left eye: No discharge.     Conjunctiva/sclera: Conjunctivae normal.  Cardiovascular:     Rate and Rhythm: Normal rate.  Pulmonary:     Effort: Pulmonary effort is normal.  Musculoskeletal:     Right lower leg: No edema.     Left lower leg: No edema.  Skin:    General: Skin is warm and dry.     Findings: No erythema or rash.  Neurological:     Mental Status: She is alert and oriented  to person, place, and time.  Psychiatric:        Behavior: Behavior normal.     Comments: Well groomed, good eye contact, normal speech and thoughts        Results for orders placed or performed during the hospital encounter of 10/11/19  Urine Culture   Specimen: Urine, Random  Result Value Ref Range   Specimen Description      URINE, RANDOM Performed at [facility], [address] [place], [place] [address]    Special Requests      NONE Performed at [facility], [address] [place], [place] [address]    Culture >=100,000 COLONIES/mL ESCHERICHIA COLI (A)    Report Status 10/14/2019 FINAL    Organism ID, Bacteria ESCHERICHIA COLI (A)       Susceptibility   Escherichia coli - MIC*    AMPICILLIN 8 SENSITIVE Sensitive     CEFAZOLIN <=4 SENSITIVE Sensitive     CEFTRIAXONE <=1 SENSITIVE Sensitive     CIPROFLOXACIN <=0.25  SENSITIVE Sensitive     GENTAMICIN <=1 SENSITIVE Sensitive     IMIPENEM <=0.25 SENSITIVE Sensitive     NITROFURANTOIN <=16 SENSITIVE Sensitive     TRIMETH/SULFA <=20 SENSITIVE Sensitive     AMPICILLIN/SULBACTAM 4 SENSITIVE Sensitive     PIP/TAZO <=4 SENSITIVE Sensitive     * >=100,000 COLONIES/mL ESCHERICHIA COLI  Lipase, blood  Result Value Ref Range   Lipase 34 11 - 51 U/L  Comprehensive metabolic panel  Result Value Ref Range   Sodium 135 135 - 145 mmol/L   Potassium 3.9 3.5 - 5.1 mmol/L   Chloride 103 98 - 111 mmol/L   CO2 23 22 - 32 mmol/L   Glucose, Bld 110 (H) 70 - 99 mg/dL   BUN 18 8 - 23 mg/dL   Creatinine, Ser 0.95 0.44 - 1.00 mg/dL   Calcium 9.0 8.9 - 10.3 mg/dL   Total Protein 7.8 6.5 - 8.1 g/dL   Albumin 3.6 3.5 - 5.0 g/dL   AST 29 15 - 41 U/L   ALT 25 0 - 44 U/L   Alkaline Phosphatase 239 (H) 38 - 126 U/L   Total Bilirubin 1.0 0.3 - 1.2 mg/dL   GFR calc non Af Amer 59 (L) >60 mL/min   GFR calc Af Amer >60 >60 mL/min   Anion gap 9 5 - 15  CBC  Result Value Ref Range   WBC 13.1 (H) 4.0 - 10.5 K/uL   RBC 4.19 3.87 - 5.11 MIL/uL   Hemoglobin 12.5 12.0 - 15.0 g/dL   HCT 37.1 36 - 46 %   MCV 88.5 80.0 - 100.0 fL   MCH 29.8 26.0 - 34.0 pg   MCHC 33.7 30.0 - 36.0 g/dL   RDW 12.7 11.5 - 15.5 %   Platelets 169 150 - 400 K/uL   nRBC 0.0 0.0 - 0.2 %  Urinalysis, Complete w Microscopic  Result Value Ref Range   Color, Urine YELLOW (A) YELLOW   APPearance CLOUDY (A) CLEAR   Specific Gravity, Urine 1.015 1.005 - 1.030   pH 5.0 5.0 - 8.0   Glucose, UA NEGATIVE NEGATIVE mg/dL   Hgb urine dipstick SMALL (A) NEGATIVE   Bilirubin Urine NEGATIVE NEGATIVE   Ketones, ur NEGATIVE NEGATIVE mg/dL   Protein, ur 30 (A) NEGATIVE mg/dL   Nitrite POSITIVE (A) NEGATIVE   Leukocytes,Ua MODERATE (A) NEGATIVE   RBC / HPF 0-5 0 - 5 RBC/hpf   WBC, UA >50 (H) 0 -  5 WBC/hpf   Bacteria, UA RARE (A) NONE SEEN   Squamous Epithelial / LPF 0-5 0 - 5   Mucus PRESENT    Hyaline Casts, UA  PRESENT       Assessment & Plan:   Problem List Items Addressed This Visit    Hyperlipidemia   Relevant Medications   lisinopril (ZESTRIL) 20 MG tablet   Other Relevant Orders   Ambulatory referral to Cardiology   History of rheumatic fever   Relevant Orders   Ambulatory referral to Cardiology   Gastroesophageal reflux disease without esophagitis   Relevant Medications   omeprazole (PRILOSEC) 20 MG capsule   Essential hypertension - Primary   Relevant Medications   lisinopril (ZESTRIL) 20 MG tablet   Other Relevant Orders   Ambulatory referral to Cardiology    Other Visit Diagnoses    Syncope, unspecified syncope type       Relevant Medications   lisinopril (ZESTRIL) 20 MG tablet   Other Relevant Orders   Ambulatory referral to Cardiology   Right nephrolithiasis       Relevant Orders   Ambulatory referral to Urology      #R Nephrolithiasis Recent hospital ED visit for several issues, had R kidney stone on CT imaging, UTI Overall symptoms improved on therapy She did request Urologist follow up, unsure if passed stone, was told had chronic kidney stones in past Referral sent to [facility] - requested female provider  #Syncopal episode /  HTN / HLD / Hx Rheumatic Fever Currently low normal BP. No further syncope  DC Amlodipine 2.5mg  - She was using Amlodipine 2.5mg  as PRN infrequent - now DISCONTINUE this medicine REDUCE Lisinopril from 40mg  down to 20mg  - new rx 20mg  tabs take one daily, no longer cut tab in half Prior history of elevated Creatinine since resolved on repeat lab.  Referral to Cardiology [facility] History of syncopal episode 09/2019 with hospitalization nausea vomiting and UTI abdominal pain thought to have been provoked, otherwise has history of HTN with low normal BP on minimal meds, concerning history with Rheumatic Fever in childhood age 66, mother fam history with HTN and syncope in past. Requesting cardiovascular work up, would likely benefit from  ECHOcardiogram and other evaluation given history of rheumatic fever.   Meds ordered this encounter  Medications  . omeprazole (PRILOSEC) 20 MG capsule    Sig: Take 1 capsule (20 mg total) by mouth daily before breakfast.    Dispense:  90 capsule    Refill:  1  . lisinopril (ZESTRIL) 20 MG tablet    Sig: Take 1 tablet (20 mg total) by mouth at bedtime.    Dispense:  90 tablet    Refill:  1    Dose reduced to 20mg  daily    Orders Placed This Encounter  Procedures  . Ambulatory referral to Cardiology    Referral Priority:   Routine    Referral Type:   Consultation    Referral Reason:   Specialty Services Required    Requested Specialty:   Cardiology    Number of Visits Requested:   1  . Ambulatory referral to Urology    Referral Priority:   Routine    Referral Type:   Consultation    Referral Reason:   Specialty Services Required    Requested Specialty:   Urology    Number of Visits Requested:   1      Follow up plan: Return if symptoms worsen or fail to improve.   Sheppard Coil  Parks Ranger, [facility] 10/28/2019, 11:33 AM

## 2019-11-04 ENCOUNTER — Other Ambulatory Visit: Payer: Self-pay

## 2019-11-04 ENCOUNTER — Encounter: Payer: Self-pay | Admitting: Cardiology

## 2019-11-04 ENCOUNTER — Ambulatory Visit: Payer: Medicare Other | Admitting: Cardiology

## 2019-11-04 VITALS — BP 146/80 | HR 70 | Ht 64.0 in | Wt 168.0 lb

## 2019-11-04 DIAGNOSIS — R42 Dizziness and giddiness: Secondary | ICD-10-CM

## 2019-11-04 DIAGNOSIS — I10 Essential (primary) hypertension: Secondary | ICD-10-CM | POA: Diagnosis not present

## 2019-11-04 DIAGNOSIS — R011 Cardiac murmur, unspecified: Secondary | ICD-10-CM | POA: Diagnosis not present

## 2019-11-04 NOTE — Patient Instructions (Signed)
Medication Instructions:   Your physician recommends that you continue on your current medications as directed. Please refer to the Current Medication list given to you today.   *If you need a refill on your cardiac medications before your next appointment, please call your pharmacy*   Lab Work: None Ordered If you have labs (blood work) drawn today and your tests are completely normal, you will receive your results only by: . MyChart Message (if you have MyChart) OR . A paper copy in the mail If you have any lab test that is abnormal or we need to change your treatment, we will call you to review the results.   Testing/Procedures:  Your physician has requested that you have an echocardiogram. Echocardiography is a painless test that uses sound waves to create images of your heart. It provides your doctor with information about the size and shape of your heart and how well your heart's chambers and valves are working. This procedure takes approximately one hour. There are no restrictions for this procedure.    Follow-Up: At [facility], you and your health needs are our priority.  As part of our continuing mission to provide you with exceptional heart care, we have created designated Provider Care Teams.  These Care Teams include your primary Cardiologist (physician) and Advanced Practice Providers (APPs -  Physician Assistants and Nurse Practitioners) who all work together to provide you with the care you need, when you need it.  We recommend signing up for the patient portal called "MyChart".  Sign up information is provided on this After Visit Summary.  MyChart is used to connect with patients for Virtual Visits (Telemedicine).  Patients are able to view lab/test results, encounter notes, upcoming appointments, etc.  Non-urgent messages can be sent to your provider as well.   To learn more about what you can do with MyChart, go to [email or web address].    Your next appointment:     Follow up after echo   The format for your next appointment:   In Person  Provider:   Brian Agbor-Etang, MD   Other Instructions   Echocardiogram An echocardiogram is a procedure that uses painless sound waves (ultrasound) to produce an image of the heart. Images from an echocardiogram can provide important information about:  Signs of coronary artery disease (CAD).  Aneurysm detection. An aneurysm is a weak or damaged part of an artery wall that bulges out from the normal force of blood pumping through the body.  Heart size and shape. Changes in the size or shape of the heart can be associated with certain conditions, including heart failure, aneurysm, and CAD.  Heart muscle function.  Heart valve function.  Signs of a past heart attack.  Fluid buildup around the heart.  Thickening of the heart muscle.  A tumor or infectious growth around the heart valves. Tell a health care provider about:  Any allergies you have.  All medicines you are taking, including vitamins, herbs, eye drops, creams, and over-the-counter medicines.  Any blood disorders you have.  Any surgeries you have had.  Any medical conditions you have.  Whether you are pregnant or may be pregnant. What are the risks? Generally, this is a safe procedure. However, problems may occur, including:  Allergic reaction to dye (contrast) that may be used during the procedure. What happens before the procedure? No specific preparation is needed. You may eat and drink normally. What happens during the procedure?   An IV tube may be inserted into one   of your veins.  You may receive contrast through this tube. A contrast is an injection that improves the quality of the pictures from your heart.  A gel will be applied to your chest.  A wand-like tool (transducer) will be moved over your chest. The gel will help to transmit the sound waves from the transducer.  The sound waves will harmlessly bounce off  of your heart to allow the heart images to be captured in real-time motion. The images will be recorded on a computer. The procedure may vary among health care providers and hospitals. What happens after the procedure?  You may return to your normal, everyday life, including diet, activities, and medicines, unless your health care provider tells you not to do that. Summary  An echocardiogram is a procedure that uses painless sound waves (ultrasound) to produce an image of the heart.  Images from an echocardiogram can provide important information about the size and shape of your heart, heart muscle function, heart valve function, and fluid buildup around your heart.  You do not need to do anything to prepare before this procedure. You may eat and drink normally.  After the echocardiogram is completed, you may return to your normal, everyday life, unless your health care provider tells you not to do that. This information is not intended to replace advice given to you by your health care provider. Make sure you discuss any questions you have with your health care provider. Document Revised: 08/27/2018 Document Reviewed: 06/08/2016 Elsevier Patient Education  2020 [employer]   

## 2019-11-04 NOTE — Progress Notes (Signed)
Cardiology Office Note:    Date:  11/04/2019   ID:  Vanessa Reynolds, DOB [birth year], MRN [id number]  PCP:  Olin Hauser, DO  [place] Cardiologist:  Kate Sable, MD  [place] Electrophysiologist:  None   Referring MD: Nobie Putnam *   Chief Complaint  Patient presents with  . New Patient (Initial Visit)    Ref by Dr. Parks Ranger for syncope, HTN & hyperlipidemia. Meds reviewed by the pt. verbally. Pt. c/o one spell of syncope, irreg. heart beats and shortness of breath.     History of Present Illness:    Vanessa Reynolds is a 73 y.o. female with a hx of hypertension, hyperlipidemia who presents due to syncope, dizziness.  Patient was seen in the hospital about 2 to 3 weeks ago after apparently passing out.  As per patient's husband, she was sitting in the couch when he suddenly noticed patient not responding.  Family tried to tap patient on her back and after several efforts, patient vomited.  Patient also endorses being dizzy.  Patient was taken to the emergency room and also complained of abdominal pain.  Work-up with abdominal CT revealed a renal stone for which patient is scheduled to see urology.  She was managed with IV antibiotics in the emergency room and prescribed p.o. which she took.  She states having some occasional abdominal discomfort but not as severe as prior.  She has had prior episodes of dizziness typically when she is standing or when she bends down to tie her shoelace and then raises up too quickly.  She denies ever falling.  She denies any history of heart disease.  States having a history of rheumatic fever years ago.  Her primary care provider has been adjusting her blood pressure medications due to her symptoms of dizziness.  Lisinopril was decreased from 40 to 20 mg daily.  Amlodipine was also stopped.  Past Medical History:  Diagnosis Date  . Abdominal pain, left lower quadrant 2012  . Breast screening, unspecified   . H/O  cystitis 2011  . Hypertension   . Nausea with vomiting   . Personal history of malignant neoplasm of large intestine 2013  . Personal history of tobacco use, presenting hazards to health   . 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    Past Surgical History:  Procedure Laterality Date  . APPENDECTOMY  December 2011  . BILATERAL OOPHORECTOMY  December 2011   Completed at time of low anterior resection.  . COLON SURGERY  03/14/10   lap assisted low anterior resection-adenocarcinoma of the rectum and a villous adenoma  . COLONOSCOPY  2011,2012,2015   Dr. Vira Agar, Dr. Addison Lank)    Current Medications: No outpatient medications have been marked as taking for the 11/04/19 encounter (Office Visit) with Kate Sable, MD.     Allergies:   Fish oil   Social History   Socioeconomic History  . Marital status: Married    Spouse name: Not on file  . Number of children: Not on file  . Years of education: Not on file  . Highest education level: 10th grade  Occupational History    Comment: fulltime   Tobacco Use  . Smoking status: Former Smoker    Packs/day: 1.00    Years: 30.00    Pack years: 30.00    Types: Cigarettes    Quit date: 05/20/1997    Years since quitting: 22.4  . Smokeless tobacco: Former Systems developer  .  Tobacco comment: quit in 1999  Vaping Use  . Vaping Use: Never used  Substance and Sexual Activity  . Alcohol use: No  . Drug use: No  . Sexual activity: Not on file  Other Topics Concern  . Not on file  Social History Narrative  . Not on file   Social Determinants of Health   Financial Resource Strain:   . Difficulty of Paying Living Expenses:   Food Insecurity:   . Worried About Charity fundraiser in the Last Year:   . Arboriculturist in the Last Year:   Transportation Needs:   . Film/video editor (Medical):   Marland Kitchen Lack of Transportation (Non-Medical):   Physical Activity:   . Days of Exercise per Week:     . Minutes of Exercise per Session:   Stress:   . Feeling of Stress :   Social Connections:   . Frequency of Communication with Friends and Family:   . Frequency of Social Gatherings with Friends and Family:   . Attends Religious Services:   . Active Member of Clubs or Organizations:   . Attends Archivist Meetings:   Marland Kitchen Marital Status:      Family History: The patient's family history includes Cancer in her father; Hypertension in her mother. There is no history of Breast cancer.  ROS:   Please see the history of present illness.     All other systems reviewed and are negative.  EKGs/Labs/Other Studies Reviewed:    The following studies were reviewed today:   EKG:  EKG is  ordered today.  The ekg ordered today demonstrates normal sinus rhythm, nonspecific ST changes.  Recent Labs: 08/03/2019: TSH 2.09 10/11/2019: ALT 25; BUN 18; Creatinine, Ser 0.95; Hemoglobin 12.5; Platelets 169; Potassium 3.9; Sodium 135  Recent Lipid Panel    Component Value Date/Time   CHOL 166 08/03/2019 0835   CHOL 198 02/20/2015 1138   TRIG 212 (H) 08/03/2019 0835   HDL 22 (L) 08/03/2019 0835   HDL 62 02/20/2015 1138   CHOLHDL 7.5 (H) 08/03/2019 0835   VLDL 16 06/17/2016 0001   LDLCALC 111 (H) 08/03/2019 0835    Physical Exam:    VS:  BP (!) 146/80 (BP Location: Right Arm, Patient Position: Sitting, Cuff Size: Normal)   Pulse 70   Ht 5\' 4"  (1.626 m)   Wt 168 lb (76.2 kg)   SpO2 98%   BMI 28.84 kg/m     Wt Readings from Last 3 Encounters:  11/04/19 168 lb (76.2 kg)  10/28/19 168 lb (76.2 kg)  10/13/19 166 lb 8 oz (75.5 kg)     GEN:  Well nourished, well developed in no acute distress HEENT: Normal NECK: No JVD; No carotid bruits LYMPHATICS: No lymphadenopathy CARDIAC: RRR, 2/6 systolic murmur, RESPIRATORY:  Clear to auscultation without rales, wheezing or rhonchi  ABDOMEN: Soft, non-tender, non-distended MUSCULOSKELETAL:  No edema; No deformity  SKIN: Warm and  dry NEUROLOGIC:  Alert and oriented x 3 PSYCHIATRIC:  Normal affect   ASSESSMENT:    1. Systolic murmur   2. Essential hypertension   3. Dizziness    PLAN:    In order of problems listed above:  1. Patient with systolic murmur noted on cardiac exam.  She has a history of rheumatic fever.  Will evaluate for any significant valvular abnormalities with an echocardiogram. 2. History of hypertension, blood pressure is reasonably controlled.  Medication adjustments currently being made by primary care provider.  Patient  will follow up with primary care provider regarding blood pressure management.  Continue current BP meds as prescribed. 3. History of dizziness.  Orthostatic vitals in the office today with no evidence for orthostasis.  Symptoms are consistent with positional vertigo.  Management as per primary care physician.  No further cardiac testing indicated at this time as etiology does not appear to be cardiac.  Follow-up after echocardiogram.   Medication Adjustments/Labs and Tests Ordered: Current medicines are reviewed at length with the patient today.  Concerns regarding medicines are outlined above.  Orders Placed This Encounter  Procedures  . EKG 12-Lead  . ECHOCARDIOGRAM COMPLETE   No orders of the defined types were placed in this encounter.   Patient Instructions  Medication Instructions:   Your physician recommends that you continue on your current medications as directed. Please refer to the Current Medication list given to you today.  *If you need a refill on your cardiac medications before your next appointment, please call your pharmacy*   Lab Work: None Ordered. If you have labs (blood work) drawn today and your tests are completely normal, you will receive your results only by: Marland Kitchen MyChart Message (if you have MyChart) OR . A paper copy in the mail If you have any lab test that is abnormal or we need to change your treatment, we will call you to review the  results.   Testing/Procedures:  Your physician has requested that you have an echocardiogram. Echocardiography is a painless test that uses sound waves to create images of your heart. It provides your doctor with information about the size and shape of your heart and how well your heart's chambers and valves are working. This procedure takes approximately one hour. There are no restrictions for this procedure.    Follow-Up: At [facility], you and your health needs are our priority.  As part of our continuing mission to provide you with exceptional heart care, we have created designated Provider Care Teams.  These Care Teams include your primary Cardiologist (physician) and Advanced Practice Providers (APPs -  Physician Assistants and Nurse Practitioners) who all work together to provide you with the care you need, when you need it.  We recommend signing up for the patient portal called "MyChart".  Sign up information is provided on this After Visit Summary.  MyChart is used to connect with patients for Virtual Visits (Telemedicine).  Patients are able to view lab/test results, encounter notes, upcoming appointments, etc.  Non-urgent messages can be sent to your provider as well.   To learn more about what you can do with MyChart, go to [email or web address].    Your next appointment:   Follow up after echo   The format for your next appointment:   In Person  Provider:   Kate Sable, MD   Other Instructions   Echocardiogram An echocardiogram is a procedure that uses painless sound waves (ultrasound) to produce an image of the heart. Images from an echocardiogram can provide important information about:  Signs of coronary artery disease (CAD).  Aneurysm detection. An aneurysm is a weak or damaged part of an artery wall that bulges out from the normal force of blood pumping through the body.  Heart size and shape. Changes in the size or shape of the heart can be associated  with certain conditions, including heart failure, aneurysm, and CAD.  Heart muscle function.  Heart valve function.  Signs of a past heart attack.  Fluid buildup around the heart.  Thickening of  the heart muscle.  A tumor or infectious growth around the heart valves. Tell a health care provider about:  Any allergies you have.  All medicines you are taking, including vitamins, herbs, eye drops, creams, and over-the-counter medicines.  Any blood disorders you have.  Any surgeries you have had.  Any medical conditions you have.  Whether you are pregnant or may be pregnant. What are the risks? Generally, this is a safe procedure. However, problems may occur, including:  Allergic reaction to dye (contrast) that may be used during the procedure. What happens before the procedure? No specific preparation is needed. You may eat and drink normally. What happens during the procedure?   An IV tube may be inserted into one of your veins.  You may receive contrast through this tube. A contrast is an injection that improves the quality of the pictures from your heart.  A gel will be applied to your chest.  A wand-like tool (transducer) will be moved over your chest. The gel will help to transmit the sound waves from the transducer.  The sound waves will harmlessly bounce off of your heart to allow the heart images to be captured in real-time motion. The images will be recorded on a computer. The procedure may vary among health care providers and hospitals. What happens after the procedure?  You may return to your normal, everyday life, including diet, activities, and medicines, unless your health care provider tells you not to do that. Summary  An echocardiogram is a procedure that uses painless sound waves (ultrasound) to produce an image of the heart.  Images from an echocardiogram can provide important information about the size and shape of your heart, heart muscle function,  heart valve function, and fluid buildup around your heart.  You do not need to do anything to prepare before this procedure. You may eat and drink normally.  After the echocardiogram is completed, you may return to your normal, everyday life, unless your health care provider tells you not to do that. This information is not intended to replace advice given to you by your health care provider. Make sure you discuss any questions you have with your health care provider. Document Revised: 08/27/2018 Document Reviewed: 06/08/2016 Elsevier Patient Education  2020 [place], Kate Sable, MD  11/04/2019 4:39 PM    [place]

## 2019-11-08 ENCOUNTER — Encounter: Payer: Self-pay | Admitting: Urology

## 2019-11-08 ENCOUNTER — Other Ambulatory Visit: Payer: Self-pay

## 2019-11-08 ENCOUNTER — Ambulatory Visit: Payer: Medicare Other | Admitting: Urology

## 2019-11-08 VITALS — BP 170/78 | HR 69 | Wt 168.0 lb

## 2019-11-08 DIAGNOSIS — N3000 Acute cystitis without hematuria: Secondary | ICD-10-CM | POA: Diagnosis not present

## 2019-11-08 DIAGNOSIS — N200 Calculus of kidney: Secondary | ICD-10-CM

## 2019-11-08 NOTE — Progress Notes (Signed)
11/08/2019 8:53 AM   Vanessa Reynolds [birth year] [id number]  Referring provider: Olin Hauser, DO [address] [place],  [place] [address]  No chief complaint on file.   HPI: The patient went to the emergency room 2 to 3 weeks ago with right lower quadrant pain.  She did have a positive urine culture.  Symptoms normalized.  She had approximately 3 to 4 mm stone nonobstructing in the right kidney.  She still has diffuse low back pain that is nonspecific  At baseline she leaks with coughing sneezing sometimes bending lifting.  She sometimes has urge incontinence.  No bedwetting.  She wears 3 pads a day and I think they usually damp unless she sneezes.  She normally voids every 2-3 hours and gets up 2-3 times a night  She is not had previous bladder surgery.  No history of kidney stones or bladder infections.  No neurologic issues.  Takes Metamucil for bowel.  No hysterectomy     PMH: Past Medical History:  Diagnosis Date  . Abdominal pain, left lower quadrant 2012  . Breast screening, unspecified   . H/O cystitis 2011  . Hypertension   . Nausea with vomiting   . Personal history of malignant neoplasm of large intestine 2013  . Personal history of tobacco use, presenting hazards to health   . 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    Surgical History: Past Surgical History:  Procedure Laterality Date  . APPENDECTOMY  December 2011  . BILATERAL OOPHORECTOMY  December 2011   Completed at time of low anterior resection.  . COLON SURGERY  03/14/10   lap assisted low anterior resection-adenocarcinoma of the rectum and a villous adenoma  . COLONOSCOPY  2011,2012,2015   Dr. Vira Agar, Dr. Addison Lank)    Home Medications:  Allergies as of 11/08/2019      Reactions   Fish Oil Other (See Comments)   Balance was off      Medication List       Accurate as of November 08, 2019  8:53 AM. If you have any questions, ask your nurse  or doctor.        cholecalciferol 25 MCG (1000 UNIT) tablet Commonly known as: VITAMIN D3 Take 1,000 Units by mouth daily.   fluticasone 50 MCG/ACT nasal spray Commonly known as: FLONASE Place 2 sprays into both nostrils daily. Use for 4-6 weeks then stop and use seasonally or as needed.   lisinopril 20 MG tablet Commonly known as: ZESTRIL Take 1 tablet (20 mg total) by mouth at bedtime.   loratadine 10 MG tablet Commonly known as: CLARITIN Take 1 tablet (10 mg total) by mouth daily. Use for 4-6 weeks then stop, and use as needed or seasonally   lovastatin 20 MG tablet Commonly known as: MEVACOR Take 1 tablet (20 mg total) by mouth at bedtime.   omeprazole 20 MG capsule Commonly known as: PRILOSEC Take 1 capsule (20 mg total) by mouth daily before breakfast.   PROBIOTIC DAILY PO Take 1 tablet by mouth daily.   vitamin B-12 1000 MCG tablet Commonly known as: CYANOCOBALAMIN Take 1,000 mcg by mouth daily.   ZINC PO Take by mouth daily.       Allergies:  Allergies  Allergen Reactions  . Fish Oil Other (See Comments)     Balance was off    Family History: Family History  Problem Relation Age of Onset  . Hypertension Mother  deceased, age 61  . Cancer Father        gastric cancer, deceased, mid 43's  . Breast cancer Neg Hx     Social History:  reports that she quit smoking about 22 years ago. Her smoking use included cigarettes. She has a 30.00 pack-year smoking history. She has quit using smokeless tobacco. She reports that she does not drink alcohol and does not use drugs.  ROS:                                        Physical Exam: There were no vitals taken for this visit.  Constitutional:  Alert and oriented, No acute distress. HEENT: [place] AT, moist mucus membranes.  Trachea midline, no masses. Cardiovascular: No clubbing, cyanosis, or edema. Respiratory: Normal respiratory effort, no increased work of breathing. GI:  Abdomen is soft, nontender, nondistended, no abdominal masses GU: No CVA tenderness.  No bladder tenderness Skin: No rashes, bruises or suspicious lesions. Lymph: No cervical or inguinal adenopathy. Neurologic: Grossly intact, no focal deficits, moving all 4 extremities. Psychiatric: Normal mood and affect.  Laboratory Data: Lab Results  Component Value Date   WBC 13.1 (H) 10/11/2019   HGB 12.5 10/11/2019   HCT 37.1 10/11/2019   MCV 88.5 10/11/2019   PLT 169 10/11/2019    Lab Results  Component Value Date   CREATININE 0.95 10/11/2019    No results found for: PSA  No results found for: TESTOSTERONE  Lab Results  Component Value Date   HGBA1C 5.3 08/03/2019    Urinalysis    Component Value Date/Time   COLORURINE YELLOW (A) 10/11/2019 1525   APPEARANCEUR CLOUDY (A) 10/11/2019 1525   LABSPEC 1.015 10/11/2019 1525   PHURINE 5.0 10/11/2019 1525   GLUCOSEU NEGATIVE 10/11/2019 1525   HGBUR SMALL (A) 10/11/2019 1525   BILIRUBINUR NEGATIVE 10/11/2019 [place] 10/11/2019 1525   PROTEINUR 30 (A) 10/11/2019 1525   NITRITE POSITIVE (A) 10/11/2019 1525   LEUKOCYTESUR MODERATE (A) 10/11/2019 1525    Pertinent Imaging: Chart reviewed.  Residual normal.  Urine mildly abnormal and sent for culture  Assessment & Plan: Patient had a bladder infection.  Picture drawn.  No treatment for small kidney stone.  Back pain not related.  Patient chose watchful waiting for incontinence.  Call if urine culture is positive.  See as needed  There are no diagnoses linked to this encounter.  No follow-ups on file.  Reece Packer, MD  [facility] [address], [place],  [address] [phone]

## 2019-11-09 LAB — URINALYSIS, COMPLETE
Bilirubin, UA: NEGATIVE
Glucose, UA: NEGATIVE
Ketones, UA: NEGATIVE
Nitrite, UA: NEGATIVE
Protein,UA: NEGATIVE
RBC, UA: NEGATIVE
Specific Gravity, UA: 1.02 (ref 1.005–1.030)
Urobilinogen, Ur: 0.2 mg/dL (ref 0.2–1.0)
pH, UA: 5 (ref 5.0–7.5)

## 2019-11-09 LAB — MICROSCOPIC EXAMINATION
Epithelial Cells (non renal): >10 /hpf — AB (ref 0–10)
RBC, Urine: NONE SEEN /hpf (ref 0–2)

## 2019-11-11 LAB — CULTURE, URINE COMPREHENSIVE

## 2019-11-30 ENCOUNTER — Other Ambulatory Visit: Payer: Self-pay | Admitting: General Surgery

## 2019-11-30 DIAGNOSIS — Z85048 Personal history of other malignant neoplasm of rectum, rectosigmoid junction, and anus: Secondary | ICD-10-CM | POA: Diagnosis not present

## 2019-12-01 ENCOUNTER — Other Ambulatory Visit: Payer: Self-pay | Admitting: Cardiology

## 2019-12-01 DIAGNOSIS — R011 Cardiac murmur, unspecified: Secondary | ICD-10-CM

## 2019-12-01 DIAGNOSIS — R42 Dizziness and giddiness: Secondary | ICD-10-CM

## 2019-12-09 ENCOUNTER — Other Ambulatory Visit: Payer: Self-pay

## 2019-12-09 ENCOUNTER — Ambulatory Visit (INDEPENDENT_AMBULATORY_CARE_PROVIDER_SITE_OTHER): Payer: Medicare Other

## 2019-12-09 DIAGNOSIS — R42 Dizziness and giddiness: Secondary | ICD-10-CM | POA: Diagnosis not present

## 2019-12-09 DIAGNOSIS — R011 Cardiac murmur, unspecified: Secondary | ICD-10-CM

## 2019-12-09 LAB — ECHOCARDIOGRAM COMPLETE
AR max vel: 3.19 cm2
AV Area VTI: 2.95 cm2
AV Area mean vel: 3.02 cm2
AV Mean grad: 3 mmHg
AV Peak grad: 5.3 mmHg
Ao pk vel: 1.15 m/s
Area-P 1/2: 2.28 cm2
Calc EF: 55.2 %
S' Lateral: 1.7 cm
Single Plane A2C EF: 54.9 %
Single Plane A4C EF: 55.9 %

## 2019-12-13 ENCOUNTER — Ambulatory Visit: Payer: Medicare Other | Admitting: Cardiology

## 2019-12-17 ENCOUNTER — Encounter: Payer: Self-pay | Admitting: Cardiology

## 2019-12-17 ENCOUNTER — Other Ambulatory Visit: Payer: Self-pay

## 2019-12-17 ENCOUNTER — Ambulatory Visit (INDEPENDENT_AMBULATORY_CARE_PROVIDER_SITE_OTHER): Payer: Medicare Other | Admitting: Cardiology

## 2019-12-17 VITALS — BP 130/90 | HR 64 | Ht 64.0 in | Wt 170.5 lb

## 2019-12-17 DIAGNOSIS — R011 Cardiac murmur, unspecified: Secondary | ICD-10-CM

## 2019-12-17 DIAGNOSIS — I10 Essential (primary) hypertension: Secondary | ICD-10-CM | POA: Diagnosis not present

## 2019-12-17 DIAGNOSIS — E7800 Pure hypercholesterolemia, unspecified: Secondary | ICD-10-CM

## 2019-12-17 NOTE — Patient Instructions (Signed)
Medication Instructions:  No Changes *If you need a refill on your cardiac medications before your next appointment, please call your pharmacy*   Lab Work: None Ordered. If you have labs (blood work) drawn today and your tests are completely normal, you will receive your results only by:  [place] (if you have MyChart) OR  A paper copy in the mail If you have any lab test that is abnormal or we need to change your treatment, we will call you to review the results.   Testing/Procedures: None Ordered   Follow-Up: At [facility], you and your health needs are our priority.  As part of our continuing mission to provide you with exceptional heart care, we have created designated Provider Care Teams.  These Care Teams include your primary Cardiologist (physician) and Advanced Practice Providers (APPs -  Physician Assistants and Nurse Practitioners) who all work together to provide you with the care you need, when you need it.  We recommend signing up for the patient portal called "MyChart".  Sign up information is provided on this After Visit Summary.  MyChart is used to connect with patients for Virtual Visits (Telemedicine).  Patients are able to view lab/test results, encounter notes, upcoming appointments, etc.  Non-urgent messages can be sent to your provider as well.   To learn more about what you can do with MyChart, go to [email or web address].    Your next appointment:   1 year(s)  The format for your next appointment:   In Person  Provider:   Kate Sable, MD   Other Instructions

## 2019-12-17 NOTE — Progress Notes (Signed)
Cardiology Office Note:    Date:  12/17/2019   ID:  Vanessa Reynolds, DOB 1946-03-01, MRN [id number]  PCP:  Olin Hauser, DO  [place] Cardiologist:  Kate Sable, MD  [place] Electrophysiologist:  None   Referring MD: Nobie Putnam *   Chief Complaint  Patient presents with  . office visit    F/U after echo; Meds verbally reviewed with patient.    History of Present Illness:    Vanessa Reynolds is a 74 y.o. female with a hx of hypertension, hyperlipidemia, systolic murmur who presents for follow-up.  His systolic murmur was noted after from last clinic visit.  She has a history of rheumatic fever.  Echocardiogram was ordered to evaluate any valvular or structural pathology.  She feels well, denies any symptoms of chest pain, shortness of breath.  She is taking her medications as prescribed.  She feels like her blood pressure has improved. .  Past Medical History:  Diagnosis Date  . Abdominal pain, left lower quadrant 2012  . Breast screening, unspecified   . H/O cystitis 2011  . Hypertension   . Nausea with vomiting   . Personal history of malignant neoplasm of large intestine 2013  . Personal history of tobacco use, presenting hazards to health   . 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    Past Surgical History:  Procedure Laterality Date  . APPENDECTOMY  December 2011  . BILATERAL OOPHORECTOMY  December 2011   Completed at time of low anterior resection.  . COLON SURGERY  03/14/10   lap assisted low anterior resection-adenocarcinoma of the rectum and a villous adenoma  . COLONOSCOPY  2011,2012,2015   Dr. Vira Agar, Dr. Addison Lank)    Current Medications: Current Meds  Medication Sig  . cholecalciferol (VITAMIN D3) 25 MCG (1000 UT) tablet Take 1,000 Units by mouth daily.  . fluticasone (FLONASE) 50 MCG/ACT nasal spray Place 2 sprays into both nostrils daily. Use for 4-6 weeks then stop  and use seasonally or as needed.  Marland Kitchen lisinopril (ZESTRIL) 20 MG tablet Take 1 tablet (20 mg total) by mouth at bedtime.  Marland Kitchen loratadine (CLARITIN) 10 MG tablet Take 1 tablet (10 mg total) by mouth daily. Use for 4-6 weeks then stop, and use as needed or seasonally  . lovastatin (MEVACOR) 20 MG tablet Take 1 tablet (20 mg total) by mouth at bedtime.  . Multiple Vitamins-Minerals (ZINC PO) Take by mouth daily.  Marland Kitchen omeprazole (PRILOSEC) 20 MG capsule Take 1 capsule (20 mg total) by mouth daily before breakfast.  . Probiotic Product (PROBIOTIC DAILY PO) Take 1 tablet by mouth daily.  . vitamin B-12 (CYANOCOBALAMIN) 1000 MCG tablet Take 1,000 mcg by mouth daily.     Allergies:   Fish oil   Social History   Socioeconomic History  . Marital status: Married    Spouse name: Not on file  . Number of children: Not on file  . Years of education: Not on file  . Highest education level: 10th grade  Occupational History    Comment: fulltime   Tobacco Use  . Smoking status: Former Smoker    Packs/day: 1.00    Years: 30.00    Pack years: 30.00    Types: Cigarettes    Quit date: 05/20/1997    Years since quitting: 22.5  . Smokeless tobacco: Former Systems developer  . Tobacco comment: quit in 1999  Vaping Use  . Vaping Use: Never used  Substance and Sexual Activity  . Alcohol use: No  . Drug use: No  . Sexual activity: Not on file  Other Topics Concern  . Not on file  Social History Narrative  . Not on file   Social Determinants of Health   Financial Resource Strain:   . Difficulty of Paying Living Expenses:   Food Insecurity:   . Worried About Charity fundraiser in the Last Year:   . Arboriculturist in the Last Year:   Transportation Needs:   . Film/video editor (Medical):   Marland Kitchen Lack of Transportation (Non-Medical):   Physical Activity:   . Days of Exercise per Week:   . Minutes of Exercise per Session:   Stress:   . Feeling of Stress :   Social Connections:   . Frequency of Communication  with Friends and Family:   . Frequency of Social Gatherings with Friends and Family:   . Attends Religious Services:   . Active Member of Clubs or Organizations:   . Attends Archivist Meetings:   Marland Kitchen Marital Status:      Family History: The patient's family history includes Cancer in her father; Hypertension in her mother. There is no history of Breast cancer.  ROS:   Please see the history of present illness.     All other systems reviewed and are negative.  EKGs/Labs/Other Studies Reviewed:    The following studies were reviewed today:   EKG:  EKG not ordered today.  changes.  Recent Labs: 08/03/2019: TSH 2.09 10/11/2019: ALT 25; BUN 18; Creatinine, Ser 0.95; Hemoglobin 12.5; Platelets 169; Potassium 3.9; Sodium 135  Recent Lipid Panel    Component Value Date/Time   CHOL 166 08/03/2019 0835   CHOL 198 02/20/2015 1138   TRIG 212 (H) 08/03/2019 0835   HDL 22 (L) 08/03/2019 0835   HDL 62 02/20/2015 1138   CHOLHDL 7.5 (H) 08/03/2019 0835   VLDL 16 06/17/2016 0001   LDLCALC 111 (H) 08/03/2019 0835    Physical Exam:    VS:  BP (!) 130/90 (BP Location: Left Arm, Patient Position: Sitting, Cuff Size: Normal)   Pulse 64   Ht 5\' 4"  (1.626 m)   Wt 170 lb 8 oz (77.3 kg)   SpO2 97%   BMI 29.27 kg/m     Wt Readings from Last 3 Encounters:  12/17/19 170 lb 8 oz (77.3 kg)  11/08/19 168 lb (76.2 kg)  11/04/19 168 lb (76.2 kg)     GEN:  Well nourished, well developed in no acute distress HEENT: Normal NECK: No JVD; No carotid bruits LYMPHATICS: No lymphadenopathy CARDIAC: RRR, 2/6 systolic murmur, RESPIRATORY:  Clear to auscultation without rales, wheezing or rhonchi  ABDOMEN: Soft, non-tender, non-distended MUSCULOSKELETAL:  No edema; No deformity  SKIN: Warm and dry NEUROLOGIC:  Alert and oriented x 3 PSYCHIATRIC:  Normal affect   ASSESSMENT:    1. Systolic murmur   2. Essential hypertension   3. Pure hypercholesterolemia    PLAN:    In order of  problems listed above:  1. Patient with systolic murmur noted on cardiac exam.  She has a history of rheumatic fever.  Echocardiogram showed normal systolic function, EF 60 to 65%, impaired relaxation, mild LVH.  Mild aortic valve regurgitation.  Overall okay echocardiogram, LVH likely due to hypertension.  Repeat ultrasound can be considered in 3 to 5 years as per [facility] AHA guidelines for mild AI. 2. History of hypertension, blood pressure is much improved  from prior.  Hypertension being managed by primary care provider.  Continue lisinopril 3. Patient with history of hyperlipidemia, continue statin.  Follow-up in 1 year  Total encounter time 35 minutes  Greater than 50% was spent in counseling and coordination of care with the patient    Medication Adjustments/Labs and Tests Ordered: Current medicines are reviewed at length with the patient today.  Concerns regarding medicines are outlined above.  No orders of the defined types were placed in this encounter.  No orders of the defined types were placed in this encounter.   Patient Instructions  Medication Instructions:  No Changes *If you need a refill on your cardiac medications before your next appointment, please call your pharmacy*   Lab Work: None Ordered. If you have labs (blood work) drawn today and your tests are completely normal, you will receive your results only by: Marland Kitchen MyChart Message (if you have MyChart) OR . A paper copy in the mail If you have any lab test that is abnormal or we need to change your treatment, we will call you to review the results.   Testing/Procedures: None Ordered   Follow-Up: At [facility], you and your health needs are our priority.  As part of our continuing mission to provide you with exceptional heart care, we have created designated Provider Care Teams.  These Care Teams include your primary Cardiologist (physician) and Advanced Practice Providers (APPs -  Physician Assistants and  Nurse Practitioners) who all work together to provide you with the care you need, when you need it.  We recommend signing up for the patient portal called "MyChart".  Sign up information is provided on this After Visit Summary.  MyChart is used to connect with patients for Virtual Visits (Telemedicine).  Patients are able to view lab/test results, encounter notes, upcoming appointments, etc.  Non-urgent messages can be sent to your provider as well.   To learn more about what you can do with MyChart, go to [email or web address].    Your next appointment:   1 year(s)  The format for your next appointment:   In Person  Provider:   Kate Sable, MD   Other Instructions      Signed, Kate Sable, MD  12/17/2019 5:05 PM    [place]

## 2020-01-03 ENCOUNTER — Other Ambulatory Visit
Admission: RE | Admit: 2020-01-03 | Discharge: 2020-01-03 | Disposition: A | Discharge status: Home / Self Care | Payer: Medicare Other | Source: Ambulatory Visit | Attending: General Surgery | Admitting: General Surgery

## 2020-01-03 ENCOUNTER — Other Ambulatory Visit: Payer: Self-pay

## 2020-01-03 ENCOUNTER — Encounter: Payer: Self-pay | Admitting: Emergency Medicine

## 2020-01-03 ENCOUNTER — Emergency Department: Payer: Medicare Other

## 2020-01-03 ENCOUNTER — Emergency Department
Admission: EM | Admit: 2020-01-03 | Discharge: 2020-01-03 | Disposition: A | Discharge status: Home / Self Care | Payer: Medicare Other | Attending: Emergency Medicine | Admitting: Emergency Medicine

## 2020-01-03 ENCOUNTER — Ambulatory Visit: Payer: Self-pay

## 2020-01-03 DIAGNOSIS — Z01812 Encounter for preprocedural laboratory examination: Secondary | ICD-10-CM | POA: Insufficient documentation

## 2020-01-03 DIAGNOSIS — R519 Headache, unspecified: Secondary | ICD-10-CM | POA: Insufficient documentation

## 2020-01-03 DIAGNOSIS — H538 Other visual disturbances: Secondary | ICD-10-CM | POA: Diagnosis not present

## 2020-01-03 DIAGNOSIS — R4781 Slurred speech: Secondary | ICD-10-CM | POA: Diagnosis not present

## 2020-01-03 DIAGNOSIS — G459 Transient cerebral ischemic attack, unspecified: Secondary | ICD-10-CM | POA: Diagnosis not present

## 2020-01-03 DIAGNOSIS — Z5321 Procedure and treatment not carried out due to patient leaving prior to being seen by health care provider: Secondary | ICD-10-CM | POA: Diagnosis not present

## 2020-01-03 DIAGNOSIS — Z20822 Contact with and (suspected) exposure to covid-19: Secondary | ICD-10-CM | POA: Insufficient documentation

## 2020-01-03 DIAGNOSIS — D329 Benign neoplasm of meninges, unspecified: Secondary | ICD-10-CM | POA: Diagnosis not present

## 2020-01-03 DIAGNOSIS — I6782 Cerebral ischemia: Secondary | ICD-10-CM | POA: Diagnosis not present

## 2020-01-03 LAB — CBC
HCT: 37.5 % (ref 36.0–46.0)
Hemoglobin: 12.5 g/dL (ref 12.0–15.0)
MCH: 29.8 pg (ref 26.0–34.0)
MCHC: 33.3 g/dL (ref 30.0–36.0)
MCV: 89.3 fL (ref 80.0–100.0)
Platelets: 159 10*3/uL (ref 150–400)
RBC: 4.2 MIL/uL (ref 3.87–5.11)
RDW: 13.1 % (ref 11.5–15.5)
WBC: 5.8 10*3/uL (ref 4.0–10.5)
nRBC: 0 % (ref 0.0–0.2)

## 2020-01-03 LAB — COMPREHENSIVE METABOLIC PANEL
ALT: 30 U/L (ref 0–44)
AST: 36 U/L (ref 15–41)
Albumin: 3.9 g/dL (ref 3.5–5.0)
Alkaline Phosphatase: 236 U/L — ABNORMAL HIGH (ref 38–126)
Anion gap: 8 (ref 5–15)
BUN: 20 mg/dL (ref 8–23)
CO2: 25 mmol/L (ref 22–32)
Calcium: 9.3 mg/dL (ref 8.9–10.3)
Chloride: 105 mmol/L (ref 98–111)
Creatinine, Ser: 0.82 mg/dL (ref 0.44–1.00)
GFR calc Af Amer: >60 mL/min (ref >60)
GFR calc non Af Amer: >60 mL/min (ref >60)
Glucose, Bld: 97 mg/dL (ref 70–99)
Potassium: 4.4 mmol/L (ref 3.5–5.1)
Sodium: 138 mmol/L (ref 135–145)
Total Bilirubin: 0.9 mg/dL (ref 0.3–1.2)
Total Protein: 8 g/dL (ref 6.5–8.1)

## 2020-01-03 LAB — DIFFERENTIAL
Abs Immature Granulocytes: 0.03 10*3/uL (ref 0.00–0.07)
Basophils Absolute: 0.1 10*3/uL (ref 0.0–0.1)
Basophils Relative: 1 %
Eosinophils Absolute: 0.2 10*3/uL (ref 0.0–0.5)
Eosinophils Relative: 4 %
Immature Granulocytes: 1 %
Lymphocytes Relative: 44 %
Lymphs Abs: 2.6 10*3/uL (ref 0.7–4.0)
Monocytes Absolute: 0.3 10*3/uL (ref 0.1–1.0)
Monocytes Relative: 5 %
Neutro Abs: 2.7 10*3/uL (ref 1.7–7.7)
Neutrophils Relative %: 45 %

## 2020-01-03 LAB — PROTIME-INR
INR: 1 (ref 0.8–1.2)
Prothrombin Time: 12.3 seconds (ref 11.4–15.2)

## 2020-01-03 LAB — APTT: aPTT: 30 seconds (ref 24–36)

## 2020-01-03 LAB — SARS CORONAVIRUS 2 (TAT 6-24 HRS): SARS Coronavirus 2: NEGATIVE

## 2020-01-03 IMAGING — CT CT HEAD W/O CM
3 series · 15 of 46 positions shown, 18 images · non-contrast
Comparison: None.

CLINICAL DATA: Episode of difficulty speaking and blurry vision
yesterday at 2 p.m.

EXAM:
CT HEAD WITHOUT CONTRAST
TECHNIQUE: Contiguous axial images were obtained from the base of the skull
through the vertex without intravenous contrast.

[Series 2: head wo · axial · 0.45mm/px · z∈[-102,+18]mm · 9 of 29 slices shown, 12 images]
[im 3/29  brain]
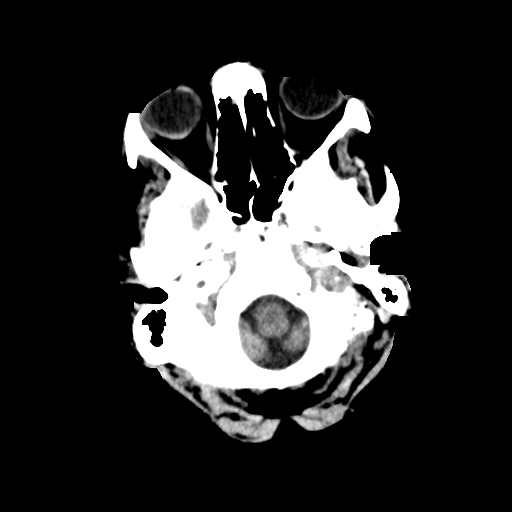
[im 3/29  bone]
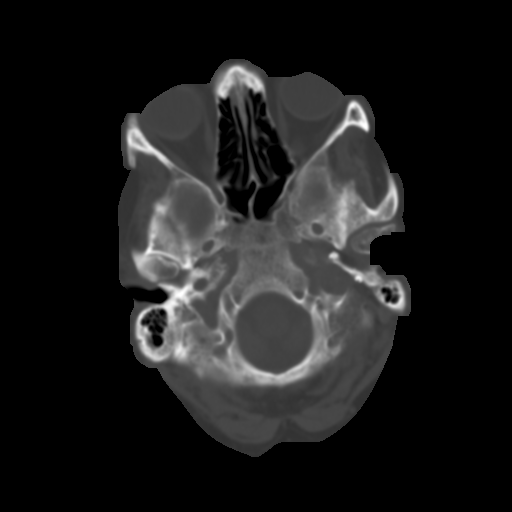
[im 6/29  brain]
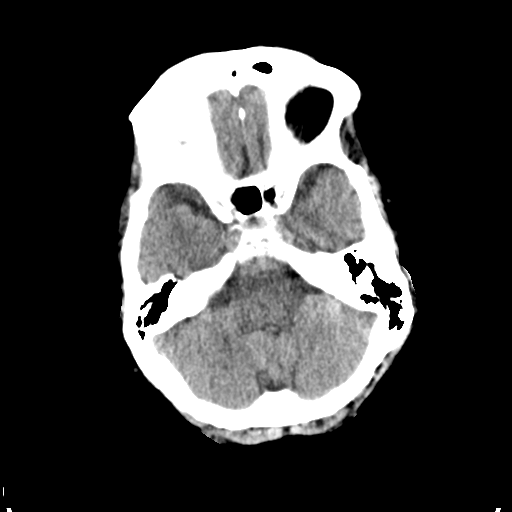
[im 9/29  brain]
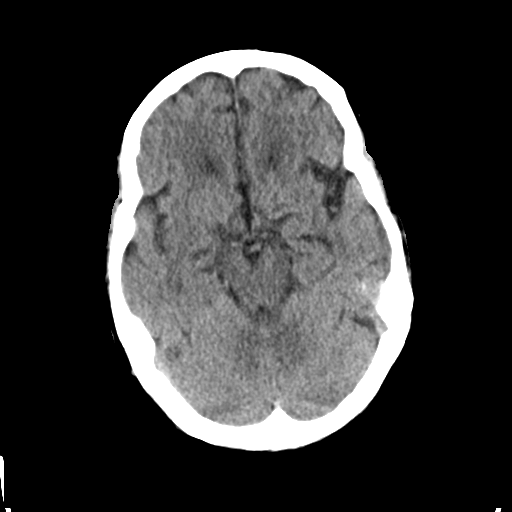
[im 12/29  brain]
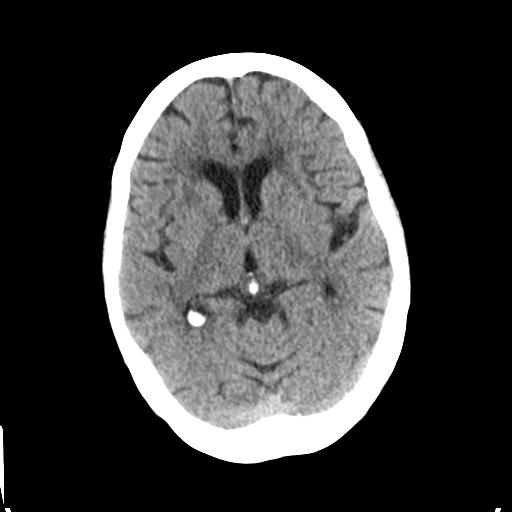
[im 15/29  brain]
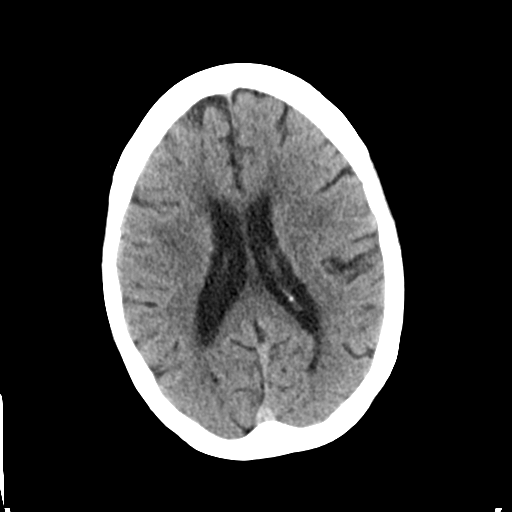
[im 15/29  bone]
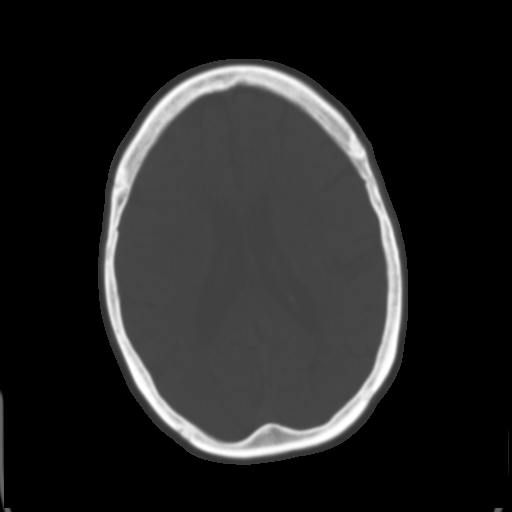
[im 18/29  brain]
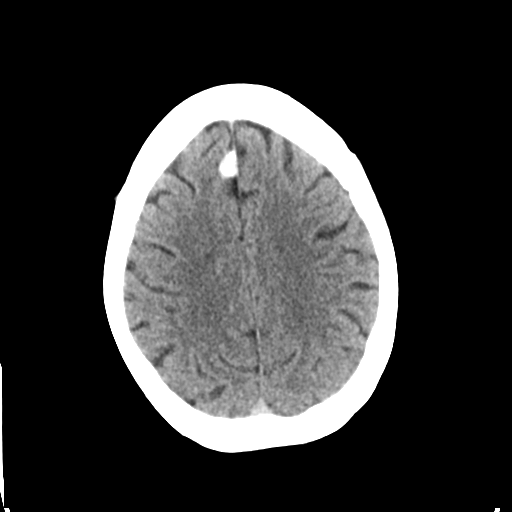
[im 21/29  brain]
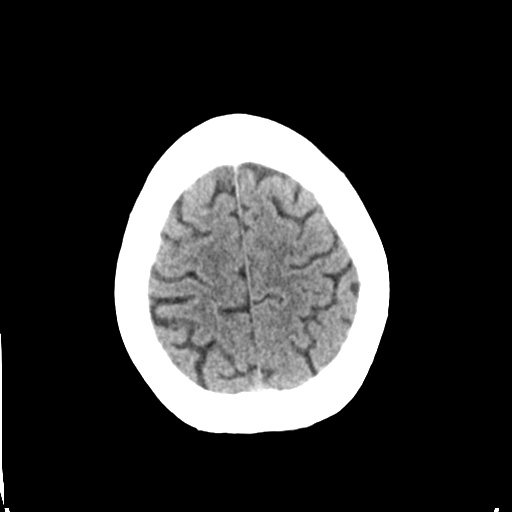
[im 24/29  brain]
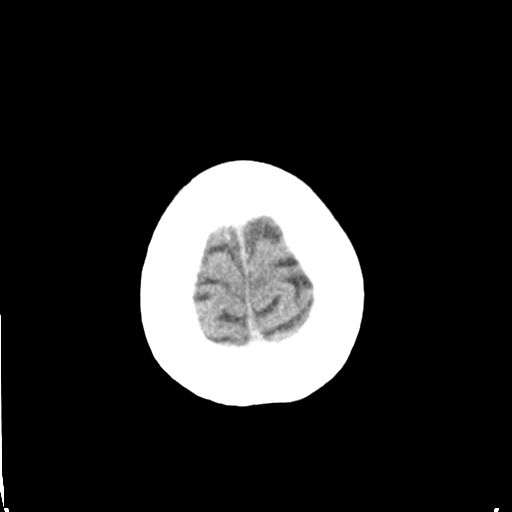
[im 27/29  brain]
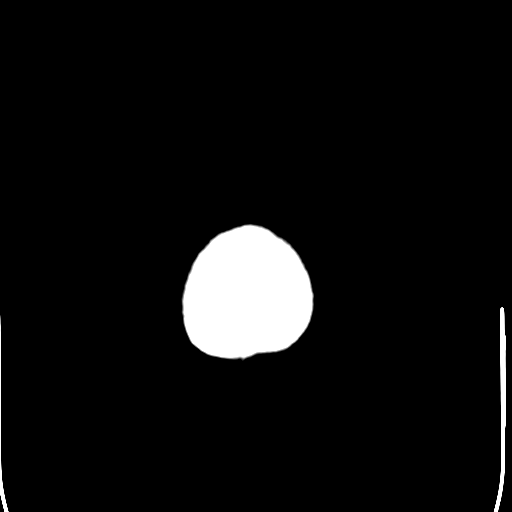
[im 27/29  bone]
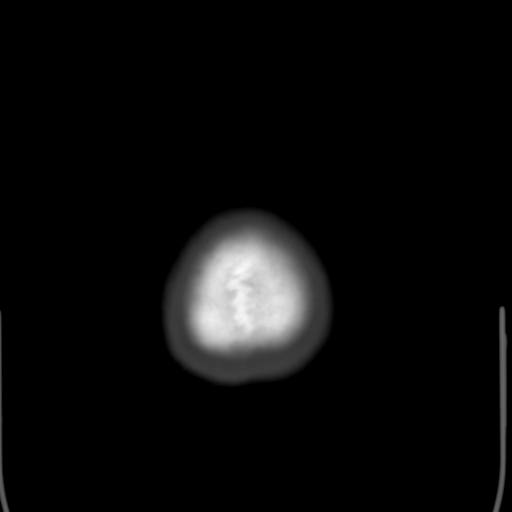

[Series 4: coronal soft tissue · coronal · 0.30mm/px · 3 of 60 slices shown]
[im 20/60  brain]
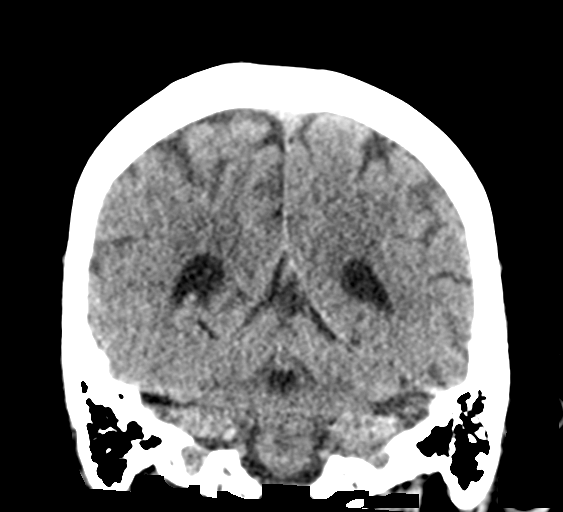
[im 27/60  brain]
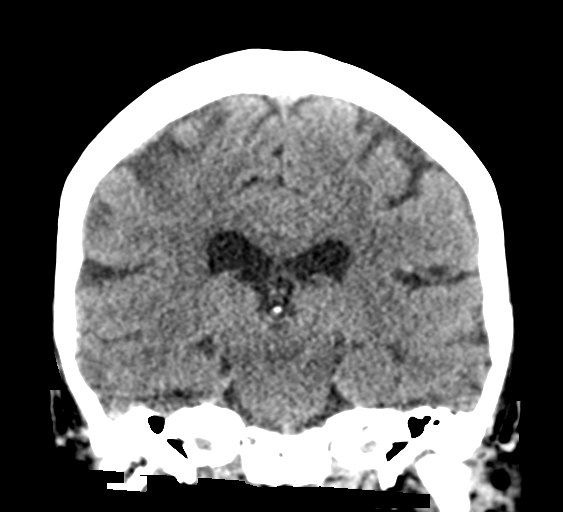
[im 33/60  brain]
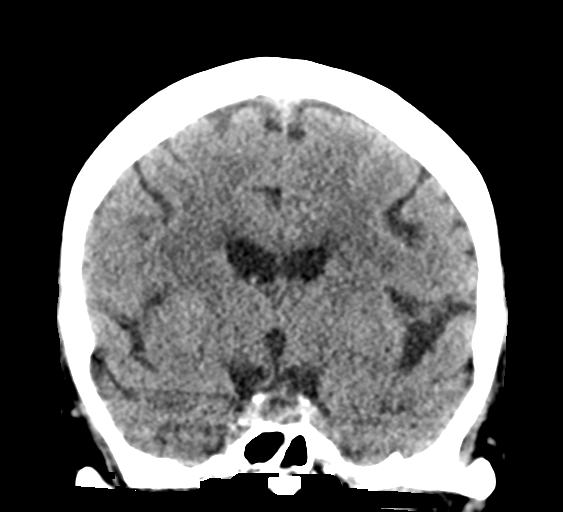

[Series 5: sagittal soft tissue · sagittal · 0.28mm/px · 3 of 49 slices shown]
[im 17/49  brain]
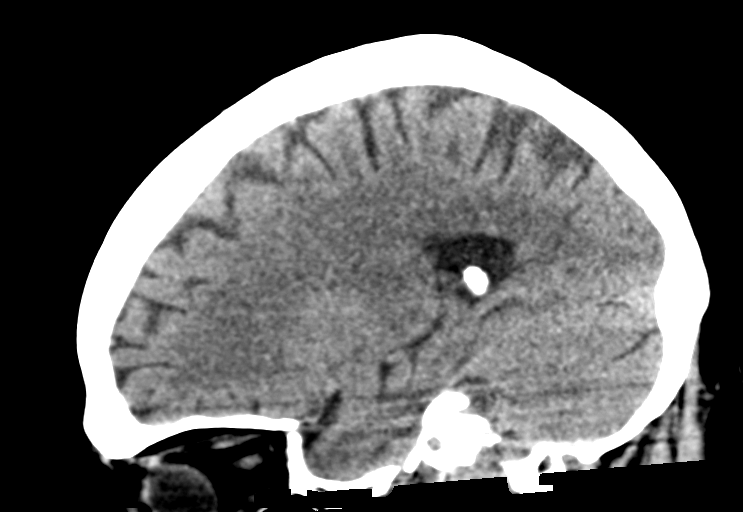
[im 25/49  brain]
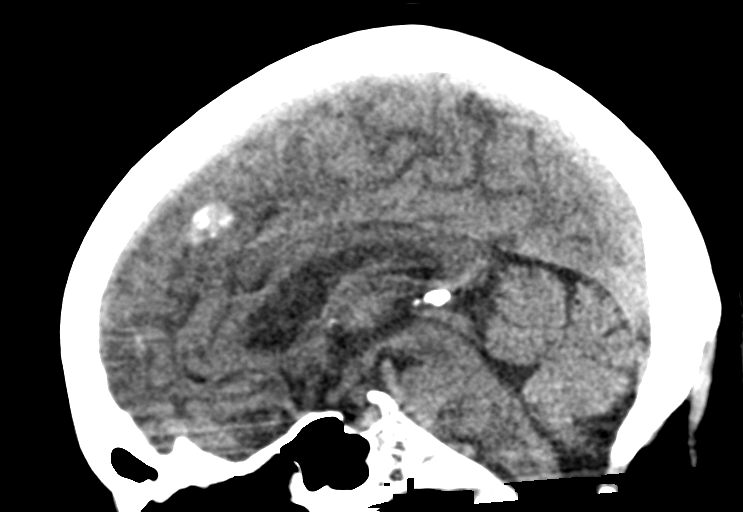
[im 33/49  brain]
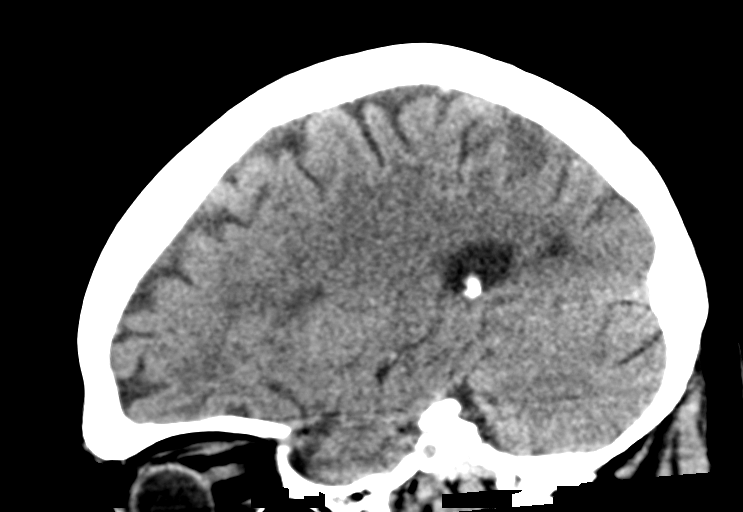

[15 of 46 positions shown; findings below may reference images not displayed]

FINDINGS: Brain: No acute abnormality including hemorrhage, infarct, midline
shift or abnormal extra-axial fluid collection is identified.
Chronic microvascular ischemic change is seen. There is a calcified
lesion along the right side of the anterior falx measuring 0.7 cm
transverse x 1 cm AP x 0.8 cm craniocaudal most consistent with a
meningioma. No surrounding edema.

Vascular: No hyperdense vessel or unexpected calcification.

Skull: Intact.  No focal lesion.

Sinuses/Orbits: Status post cataract surgery.  Otherwise negative.

Other: None.
IMPRESSION: No acute abnormality.

Chronic microvascular ischemic change.

Small right parafalcine meningioma.

## 2020-01-03 NOTE — Telephone Encounter (Addendum)
Husband reports pt. Had an episode last night where her speech was garbled. Lasted 15 minutes. Speech is clear this morning. Pt. Has a headache now and left arm pain/soreness. States "I think she had a stroke." Instructed to take pt. To ED now. Verbalizes understanding. Reason for Disposition . Headache  (and neurologic deficit)  Answer Assessment - Initial Assessment Questions 1. SYMPTOM: "What is the main symptom you are concerned about?" (e.g., weakness, numbness)     Garbled speech 2. ONSET: "When did this start?" (minutes, hours, days; while sleeping)     Last night 3. LAST NORMAL: "When was the last time you were normal (no symptoms)?"     Yesterday 4. PATTERN "Does this come and go, or has it been constant since it started?"  "Is it present now?"     Comes and goes 5. CARDIAC SYMPTOMS: "Have you had any of the following symptoms: chest pain, difficulty breathing, palpitations?"     No 6. NEUROLOGIC SYMPTOMS: "Have you had any of the following symptoms: headache, dizziness, vision loss, double vision, changes in speech, unsteady on your feet?"     Headache, left arm sore 7. OTHER SYMPTOMS: "Do you have any other symptoms?"     No 8. PREGNANCY: "Is there any chance you are pregnant?" "When was your last menstrual period?"     No  Protocols used: NEUROLOGIC DEFICIT-A-AH

## 2020-01-03 NOTE — ED Triage Notes (Signed)
Pt in via POV, reports yesterday at approximately 1400, episode of blurred vision and word salad; states, "My words were all scrambled."  Symptoms have resolved at this time.  Denies any hx of same.  Denies blood thinners.  A/Ox4, neurologically intact at this time.

## 2020-01-04 ENCOUNTER — Telehealth: Payer: Self-pay | Admitting: Family Medicine

## 2020-01-04 ENCOUNTER — Encounter: Payer: Self-pay | Admitting: General Surgery

## 2020-01-04 NOTE — Telephone Encounter (Signed)
As per patient she had all these test done at ED wait period was four hours since there were not too many beds available and she told nurse to print paper and ask PCP for the results--advised her to schedule appointment today and tomorrow but she does not want to schedule this week so appointment is scheduled for next week with her convenient time for hospital follow up.

## 2020-01-04 NOTE — Telephone Encounter (Signed)
Thank you for talking to her and scheduling the HFU apt.  Yes unfortunately due to issues with patient's leaving ED after preliminary testing, we usually don't just provide ED results over the phone. I would like to evaluate her in office and discuss in more detail in person we can review her CT result and lab results further.  On my review, there was no urgent or significant lab abnormality or test result on imaging that we need to do any further intervention sooner.  She can keep apt next week and we can discuss  Nobie Putnam, [facility] 01/04/2020, 12:17 PM

## 2020-01-04 NOTE — Telephone Encounter (Signed)
Patient calling to inquire if Dr. Parks Ranger has reviewed patients CT scan from ED visit yesterday. She is requesting call back.

## 2020-01-05 ENCOUNTER — Encounter: Payer: Self-pay | Admitting: General Surgery

## 2020-01-05 ENCOUNTER — Encounter
Admission: RE | Disposition: A | Discharge status: Home / Self Care | Payer: Self-pay | Source: Home / Self Care | Attending: General Surgery

## 2020-01-05 ENCOUNTER — Ambulatory Visit: Payer: Medicare Other | Admitting: Anesthesiology

## 2020-01-05 ENCOUNTER — Ambulatory Visit
Admission: RE | Admit: 2020-01-05 | Discharge: 2020-01-05 | Disposition: A | Discharge status: Home / Self Care | Payer: Medicare Other | Attending: General Surgery | Admitting: General Surgery

## 2020-01-05 DIAGNOSIS — Z85038 Personal history of other malignant neoplasm of large intestine: Secondary | ICD-10-CM | POA: Insufficient documentation

## 2020-01-05 DIAGNOSIS — K5730 Diverticulosis of large intestine without perforation or abscess without bleeding: Secondary | ICD-10-CM | POA: Diagnosis not present

## 2020-01-05 DIAGNOSIS — I10 Essential (primary) hypertension: Secondary | ICD-10-CM | POA: Insufficient documentation

## 2020-01-05 DIAGNOSIS — E785 Hyperlipidemia, unspecified: Secondary | ICD-10-CM | POA: Insufficient documentation

## 2020-01-05 DIAGNOSIS — D125 Benign neoplasm of sigmoid colon: Secondary | ICD-10-CM | POA: Diagnosis not present

## 2020-01-05 DIAGNOSIS — D123 Benign neoplasm of transverse colon: Secondary | ICD-10-CM | POA: Insufficient documentation

## 2020-01-05 DIAGNOSIS — D329 Benign neoplasm of meninges, unspecified: Secondary | ICD-10-CM | POA: Insufficient documentation

## 2020-01-05 DIAGNOSIS — K5790 Diverticulosis of intestine, part unspecified, without perforation or abscess without bleeding: Secondary | ICD-10-CM | POA: Diagnosis not present

## 2020-01-05 DIAGNOSIS — Z87891 Personal history of nicotine dependence: Secondary | ICD-10-CM | POA: Insufficient documentation

## 2020-01-05 DIAGNOSIS — K219 Gastro-esophageal reflux disease without esophagitis: Secondary | ICD-10-CM | POA: Diagnosis not present

## 2020-01-05 DIAGNOSIS — Z79899 Other long term (current) drug therapy: Secondary | ICD-10-CM | POA: Insufficient documentation

## 2020-01-05 DIAGNOSIS — Z1211 Encounter for screening for malignant neoplasm of colon: Secondary | ICD-10-CM | POA: Diagnosis not present

## 2020-01-05 DIAGNOSIS — K635 Polyp of colon: Secondary | ICD-10-CM | POA: Diagnosis not present

## 2020-01-05 DIAGNOSIS — D126 Benign neoplasm of colon, unspecified: Secondary | ICD-10-CM | POA: Diagnosis not present

## 2020-01-05 DIAGNOSIS — D121 Benign neoplasm of appendix: Secondary | ICD-10-CM | POA: Diagnosis not present

## 2020-01-05 HISTORY — PX: COLONOSCOPY WITH PROPOFOL: SHX5780

## 2020-01-05 HISTORY — DX: Allergy, unspecified, initial encounter: T78.40XA

## 2020-01-05 HISTORY — DX: Hyperlipidemia, unspecified: E78.5

## 2020-01-05 HISTORY — DX: Personal history of other malignant neoplasm of large intestine: Z85.038

## 2020-01-05 SURGERY — COLONOSCOPY WITH PROPOFOL
Anesthesia: General

## 2020-01-05 MED ORDER — LIDOCAINE HCL (CARDIAC) PF 100 MG/5ML IV SOSY
PREFILLED_SYRINGE | INTRAVENOUS | Status: DC | PRN
  Administered 2020-01-05: 100 mg via INTRAVENOUS

## 2020-01-05 MED ORDER — PROPOFOL 10 MG/ML IV BOLUS
INTRAVENOUS | Status: AC
  Filled 2020-01-05: qty 80

## 2020-01-05 MED ORDER — SODIUM CHLORIDE 0.9 % IV SOLN
INTRAVENOUS | Status: DC
  Administered 2020-01-05: 1000 mL via INTRAVENOUS

## 2020-01-05 MED ORDER — PROPOFOL 10 MG/ML IV BOLUS
INTRAVENOUS | Status: DC | PRN
  Administered 2020-01-05: 50 mg via INTRAVENOUS

## 2020-01-05 MED ORDER — PROPOFOL 500 MG/50ML IV EMUL
INTRAVENOUS | Status: DC | PRN
  Administered 2020-01-05: 100 ug/kg/min via INTRAVENOUS

## 2020-01-05 NOTE — Transfer of Care (Signed)
Immediate Anesthesia Transfer of Care Note  Patient: Vanessa Reynolds  Procedure(s) Performed: COLONOSCOPY WITH PROPOFOL (N/A )  Patient Location: PACU and Endoscopy Unit  Anesthesia Type:General  Level of Consciousness: drowsy and patient cooperative  Airway & Oxygen Therapy: Patient Spontanous Breathing  Post-op Assessment: Report given to RN and Post -op Vital signs reviewed and stable  Post vital signs: Reviewed and stable  Last Vitals:  Vitals Value Taken Time  BP 104/57 01/05/20 0858  Temp    Pulse 56 01/05/20 0858  Resp 16 01/05/20 0858  SpO2 100 % 01/05/20 0858  Vitals shown include unvalidated device data.  Last Pain:  Vitals:   01/05/20 0800  PainSc: 0-No pain         Complications: No complications documented.

## 2020-01-05 NOTE — Anesthesia Preprocedure Evaluation (Signed)
Anesthesia Evaluation  Patient identified by MRN, date of birth, ID band Patient awake    Reviewed: Allergy & Precautions, H&P , NPO status , Patient's Chart, lab work & pertinent test results  Airway Mallampati: III  TM Distance: <3 FB Neck ROM: limited    Dental  (+) Chipped, Poor Dentition, Missing   Pulmonary neg pulmonary ROS, neg shortness of breath, former smoker,    Pulmonary exam normal        Cardiovascular hypertension, Normal cardiovascular exam     Neuro/Psych negative neurological ROS  negative psych ROS   GI/Hepatic Neg liver ROS, GERD  ,  Endo/Other  negative endocrine ROS  Renal/GU negative Renal ROS  negative genitourinary   Musculoskeletal   Abdominal   Peds  Hematology negative hematology ROS (+)   Anesthesia Other Findings New meningioma diagnosis  Past Medical History: 2012: Abdominal pain, left lower quadrant No date: Allergy No date: Breast screening, unspecified 2011: H/O cystitis No date: History of colon cancer No date: Hyperlipidemia No date: Hypertension No date: Nausea with vomiting 2013: Personal history of malignant neoplasm of large intestine No date: Personal history of tobacco use, presenting hazards to health 04/2010: Rectal cancer ([place])     Comment:  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 No date: Rectal cancer [facility])  Past Surgical History: December 2011: APPENDECTOMY December 2011: BILATERAL OOPHORECTOMY     Comment:  Completed at time of low anterior resection. 03/14/10: COLON SURGERY     Comment:  lap assisted low anterior resection-adenocarcinoma of               the rectum and a villous adenoma 2011,2012,2015: COLONOSCOPY     Comment:  Dr. Vira Agar, Dr. Addison Lank)  BMI    Body Mass Index: 28.90 kg/m      Reproductive/Obstetrics negative OB ROS                             Anesthesia Physical Anesthesia  Plan  ASA: III  Anesthesia Plan: General   Post-op Pain Management:    Induction: Intravenous  PONV Risk Score and Plan: Propofol infusion and TIVA  Airway Management Planned: Natural Airway and Nasal Cannula  Additional Equipment:   Intra-op Plan:   Post-operative Plan:   Informed Consent: I have reviewed the patients History and Physical, chart, labs and discussed the procedure including the risks, benefits and alternatives for the proposed anesthesia with the patient or authorized representative who has indicated his/her understanding and acceptance.     Dental Advisory Given  Plan Discussed with: Anesthesiologist, CRNA and Surgeon  Anesthesia Plan Comments: (Patient consented for risks of anesthesia including but not limited to:  - adverse reactions to medications - risk of intubation if required - damage to eyes, teeth, lips or other oral mucosa - nerve damage due to positioning  - sore throat or hoarseness - Damage to heart, brain, nerves, lungs, other parts of body or loss of life  Patient voiced understanding.)        Anesthesia Quick Evaluation

## 2020-01-05 NOTE — H&P (Addendum)
Patient ID: Vanessa Reynolds is a 74 y.o. female.  HPI  The following portions of the patient's history were reviewed and updated as appropriate.  This an established patient is here today for: office visit. Here today to discuss having a colonoscopy, last completed 2015. Bowels move every other day with the use of daily metamucil.  The patient reports that both she and her husband had Covid earlier this year. She is here with her husband, Delfino Lovett.  Chief Complaint  Patient presents with  . Pre-op Exam  colonoscopy    BP 172/84  Pulse 65  Temp 36.7 C (98 F)  Ht 162.6 cm (5\' 4" )  Wt 76.7 kg (169 lb)  SpO2 94%  BMI 29.01 kg/m   Past Medical History:  Diagnosis Date  . Allergy  . History of cancer  colon  . History of malignant neoplasm of large intestine  . Hyperlipidemia  . Hypertension  . Rectal cancer ([facility]) 03/14/2010  Low anterior resection, 2.1 adenocarcinoma, 0/7 nodes+; pT1,N0, bilateral oophorectomy, appendix.    Past Surgical History:  Procedure Laterality Date  . APPENDECTOMY  . COLON SURGERY  . COLONOSCOPY 2015  . OOPHORECTOMY Bilateral    OB History  Gravida  3  Para  3  Term   Preterm   AB   Living    SAB   TAB   Ectopic   Molar   Multiple   Live Births     Obstetric Comments  Age at first period 67 Age at first pregnancy 65 Menopause 71     Social History   Socioeconomic History  . Marital status: Married  Spouse name: Not on file  . Number of children: Not on file  . Years of education: Not on file  . Highest education level: Not on file  Occupational History  . Not on file  Tobacco Use  . Smoking status: Former Smoker  Packs/day: 2.00  Years: 30.00  Pack years: 60.00  Types: Cigarettes  Quit date: 05/20/1997  Years since quitting: 22.5  . Smokeless tobacco: Never Used  Substance and Sexual Activity  . Alcohol use: Not on file  . Drug use: Never  . Sexual activity: Not on file  Other Topics Concern   . Not on file  Social History Narrative  . Not on file   Social Determinants of Health   Financial Resource Strain:  . Difficulty of Paying Living Expenses:  Food Insecurity:  . Worried About Charity fundraiser in the Last Year:  . Arboriculturist in the Last Year:  Transportation Needs:  . Film/video editor (Medical):  Marland Kitchen Lack of Transportation (Non-Medical):    Allergies  Allergen Reactions  . Fish Oil Other (See Comments)   Balance was off   Current Outpatient Medications  Medication Sig Dispense Refill  . cholecalciferol (VITAMIN D3) 1000 unit tablet Take by mouth  . cyanocobalamin (VITAMIN B12) 1000 MCG tablet Take by mouth once daily  . fluticasone propionate (FLONASE) 50 mcg/actuation nasal spray Place into both nostrils once daily  . lisinopriL (ZESTRIL) 40 MG tablet Take by mouth once daily  . loratadine (CLARITIN) 10 mg tablet Take by mouth once daily  . lovastatin (MEVACOR) 20 MG tablet TAKE 1 TABLET BY MOUTH AT BEDTIME.  . melatonin 5 mg Cap Take by mouth as needed  . psyllium, sugar, (METAMUCIL) 3.4 gram packet Take 1 packet by mouth once daily  . bisacodyL (DULCOLAX) 5 mg EC tablet Take two tablets morning  and two tablets afternoon day prior to Miralax prep. 4 tablet 0  . polyethylene glycol (MIRALAX) powder One bottle for Miralax prep. Use as directed. 255 g 0   No current facility-administered medications for this visit.   No family history on file.  Labs and Radiology:   The December 2015 colonoscopy was notable for diverticulosis.  CT scan dated Oct 11, 2019 was notable for stable hepatic cyst. No free fluid. Right renal stone without obstruction. 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 CT scan was independently reviewed.  Laboratory studies of Oct 11, 2019 were notable for alkaline phosphatase of 239. Kidney function showed an estimated GFR of 59,  creatinine of 0.95. Normal electrolytes.  CBC of the same date when she presented with acute renal calculus showed white blood cell count of 13,100 with a hemoglobin of 12.5, MCV of 88 (stable).  Review of Systems  Constitutional: Negative for chills and fever.  Respiratory: Negative for cough and chest tightness.  Gastrointestinal: Negative for blood in stool, constipation and diarrhea.  Neurological: Positive for syncope (noted by husband).    Objective:  Physical Exam Constitutional:  Appearance: Normal appearance.  Cardiovascular:  Rate and Rhythm: Normal rate and regular rhythm.  Pulses: Normal pulses.  Heart sounds: Normal heart sounds.  Pulmonary:  Effort: Pulmonary effort is normal.  Breath sounds: Normal breath sounds.  Musculoskeletal:  Cervical back: Neck supple.  Skin: General: Skin is warm and dry.  Neurological:  Mental Status: She is alert and oriented to person, place, and time.  Psychiatric:  Mood and Affect: Mood normal.    Assessment:   Candidate for repeat colonoscopy.  Isolated syncopal spell, cardiology evaluating. No recurrent symptoms.  Plan:   Indications for colonoscopy as well as risk and benefits reviewed.  The patient is scheduled for a cardiac echo exam in the next couple of weeks. We will postpone the procedure until after the study is complete. ( July 22,2021 echo: Normal systolic function, impaired relaxation, moderately elevated RV size. Overall okay echocardiogram. Normal EF of 60 to 65%)  Patient to follow up as scheduled and is aware to call for any new issues or concerns.     No interval change in history or exam.  Tolerated prep well.   Will proceed with colonoscopy.    Patient reported an episode of difficulty with speech. CT showed a  12 mm menigioma without associated inflammation or midline shift. No bleed.   Discussed w/ patient.   Reviewed with anesthesia.  Will proceed and arrange for outpatient w/u.   Carotids:  No bruit. Cardiac: RR. Echo: As noted above.

## 2020-01-05 NOTE — Progress Notes (Signed)
   01/05/20 0755  Clinical Encounter Type  Visited With Family  Visit Type Initial  Referral From Chaplain  Consult/Referral To Chaplain  While rounding [facility] waiting area, chaplain stopped and spoke with patient's family member. Lavada Mesi said he was doing fine. Chaplain wished him well and left.

## 2020-01-05 NOTE — Op Note (Signed)
[facility] Patient Name: Vanessa Reynolds Procedure Date: 01/05/2020 8:07 AM MRN: [id number] Account #: [id number] Date of Birth: [birth year] Admit Type: Outpatient Age: 74 Room: [facility] ENDO ROOM 1 Gender: Female Note Status: Finalized Procedure:             Colonoscopy Indications:           High risk colon cancer surveillance: Personal history                         of colon cancer Providers:             Robert Bellow, MD Medicines:             Monitored Anesthesia Care Complications:         No immediate complications. Procedure:             Pre-Anesthesia Assessment:                        - Prior to the procedure, a History and Physical was                         performed, and patient medications, allergies and                         sensitivities were reviewed. The patient's tolerance                         of previous anesthesia was reviewed.                        - The risks and benefits of the procedure and the                         sedation options and risks were discussed with the                         patient. All questions were answered and informed                         consent was obtained.                        After obtaining informed consent, the colonoscope was                         passed under direct vision. Throughout the procedure,                         the patient's blood pressure, pulse, and oxygen                         saturations were monitored continuously. The                         Colonoscope was introduced through the anus and                         advanced to the the cecum, identified by appendiceal  orifice and ileocecal valve. The colonoscopy was                         performed without difficulty. The patient tolerated                         the procedure well. The quality of the bowel                         preparation was excellent. Findings:      Three sessile  polyps were found in the sigmoid colon, transverse colon       and appendiceal orifice. The polyps were 5 to 8 mm in size. These polyps       were removed with a jumbo cold forceps. Resection and retrieval were       complete.      The retroflexed view of the distal rectum and anal verge was normal and       showed no anal or rectal abnormalities.      Multiple medium-mouthed diverticula were found in the sigmoid colon and       ascending colon. Impression:            - Three 5 to 8 mm polyps in the sigmoid colon, in the                         transverse colon and at the appendiceal orifice,                         removed with a jumbo cold forceps. Resected and                         retrieved.                        - The distal rectum and anal verge are normal on                         retroflexion view.                        - Diverticulosis in the sigmoid colon and in the                         ascending colon. Recommendation:        - Telephone endoscopist for pathology results in 1                         week. Procedure Code(s):     --- Professional ---                        [phone], Colonoscopy, flexible; with biopsy, single or                         multiple Diagnosis Code(s):     --- Professional ---                        J85.631, Personal history of other malignant neoplasm  of large intestine                        K63.5, Polyp of colon                        K57.30, Diverticulosis of large intestine without                         perforation or abscess without bleeding CPT copyright 2019 [employer]. All rights reserved. The codes documented in this report are preliminary and upon coder review may  be revised to meet current compliance requirements. Robert Bellow, MD 01/05/2020 8:55:46 AM This report has been signed electronically. Number of Addenda: 0 Note Initiated On: 01/05/2020 8:07 AM Scope Withdrawal Time: 0  hours 14 minutes 13 seconds  Total Procedure Duration: 0 hours 21 minutes 42 seconds  Estimated Blood Loss:  Estimated blood loss: none.      [facility]

## 2020-01-05 NOTE — Anesthesia Postprocedure Evaluation (Signed)
Anesthesia Post Note  Patient: TAILEY TOP  Procedure(s) Performed: COLONOSCOPY WITH PROPOFOL (N/A )  Patient location during evaluation: Endoscopy Anesthesia Type: General Level of consciousness: awake and alert Pain management: pain level controlled Vital Signs Assessment: post-procedure vital signs reviewed and stable Respiratory status: spontaneous breathing, nonlabored ventilation, respiratory function stable and patient connected to nasal cannula oxygen Cardiovascular status: blood pressure returned to baseline and stable Postop Assessment: no apparent nausea or vomiting Anesthetic complications: no   No complications documented.   Last Vitals:  Vitals:   01/05/20 0858 01/05/20 0908  BP: (!) 104/57 122/68  Pulse:    Resp:    Temp: (!) 36.2 C   SpO2:      Last Pain:  Vitals:   01/05/20 0928  TempSrc:   PainSc: 0-No pain                 Precious Haws Casimiro Lienhard

## 2020-01-06 ENCOUNTER — Other Ambulatory Visit: Payer: Self-pay | Admitting: General Surgery

## 2020-01-06 ENCOUNTER — Encounter: Payer: Self-pay | Admitting: General Surgery

## 2020-01-06 DIAGNOSIS — G459 Transient cerebral ischemic attack, unspecified: Secondary | ICD-10-CM

## 2020-01-06 LAB — SURGICAL PATHOLOGY

## 2020-01-10 ENCOUNTER — Other Ambulatory Visit: Payer: Self-pay

## 2020-01-10 ENCOUNTER — Ambulatory Visit (INDEPENDENT_AMBULATORY_CARE_PROVIDER_SITE_OTHER): Payer: Medicare Other

## 2020-01-10 ENCOUNTER — Encounter (INDEPENDENT_AMBULATORY_CARE_PROVIDER_SITE_OTHER): Payer: Self-pay | Admitting: Vascular Surgery

## 2020-01-10 ENCOUNTER — Ambulatory Visit (INDEPENDENT_AMBULATORY_CARE_PROVIDER_SITE_OTHER): Payer: Medicare Other | Admitting: Vascular Surgery

## 2020-01-10 ENCOUNTER — Other Ambulatory Visit (INDEPENDENT_AMBULATORY_CARE_PROVIDER_SITE_OTHER): Payer: Self-pay | Admitting: Vascular Surgery

## 2020-01-10 VITALS — BP 174/71 | HR 60 | Resp 16 | Wt 171.6 lb

## 2020-01-10 DIAGNOSIS — G459 Transient cerebral ischemic attack, unspecified: Secondary | ICD-10-CM | POA: Insufficient documentation

## 2020-01-10 DIAGNOSIS — E782 Mixed hyperlipidemia: Secondary | ICD-10-CM | POA: Diagnosis not present

## 2020-01-10 DIAGNOSIS — I10 Essential (primary) hypertension: Secondary | ICD-10-CM | POA: Diagnosis not present

## 2020-01-10 DIAGNOSIS — K219 Gastro-esophageal reflux disease without esophagitis: Secondary | ICD-10-CM

## 2020-01-10 NOTE — Progress Notes (Signed)
MRN : [id number]  Vanessa Reynolds is a 74 y.o. (May 25, 1945) female who presents with chief complaint of  Chief Complaint  Patient presents with  . Follow-up    ultrasound follow up  .  History of Present Illness:   The patient is seen for evaluation of carotid stenosis.  The patient experienced 2 separate episodes 1 in which she apparently passed out and then a second more recent episode where she was aphasic.  After her speech disturbance she did go to the emergency room where a CT scan of the head was performed this demonstrated a small meningioma and she has followed up with Dr. Cari Caraway regarding this.  However duplex ultrasound of the carotids or CT angiography was not performed at that time.  The patient denies amaurosis fugax. There is no interval history of TIA symptoms or focal motor deficits. There is no prior documented CVA.  There is no history of migraine headaches. There is no history of seizures.  The patient is taking enteric-coated aspirin 81 mg daily.  The patient has a history of coronary artery disease, no recent episodes of angina or shortness of breath. The patient denies PAD or claudication symptoms. There is a history of hyperlipidemia which is being treated with a statin.  Duplex ultrasound of the carotid arteries performed today demonstrates bilateral widely patent carotid artery system.  Very minimal plaque is identified essentially a normal study.  Current Meds  Medication Sig  . cholecalciferol (VITAMIN D3) 25 MCG (1000 UT) tablet Take 1,000 Units by mouth daily.  . fluticasone (FLONASE) 50 MCG/ACT nasal spray Place 2 sprays into both nostrils daily. Use for 4-6 weeks then stop and use seasonally or as needed.  Marland Kitchen lisinopril (ZESTRIL) 20 MG tablet Take 1 tablet (20 mg total) by mouth at bedtime.  Marland Kitchen loratadine (CLARITIN) 10 MG tablet Take 1 tablet (10 mg total) by mouth daily. Use for 4-6 weeks then stop, and use as needed or seasonally  . lovastatin  (MEVACOR) 20 MG tablet Take 1 tablet (20 mg total) by mouth at bedtime.  . Multiple Vitamins-Minerals (ZINC PO) Take by mouth daily.  Marland Kitchen omeprazole (PRILOSEC) 20 MG capsule Take 1 capsule (20 mg total) by mouth daily before breakfast.  . Probiotic Product (PROBIOTIC DAILY PO) Take 1 tablet by mouth daily.  . vitamin B-12 (CYANOCOBALAMIN) 1000 MCG tablet Take 1,000 mcg by mouth daily.    Past Medical History:  Diagnosis Date  . Abdominal pain, left lower quadrant 2012  . Allergy   . Breast screening, unspecified   . H/O cystitis 2011  . History of colon cancer   . Hyperlipidemia   . Hypertension   . Nausea with vomiting   . Personal history of malignant neoplasm of large intestine 2013  . Personal history of tobacco use, presenting hazards to health   . 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  Marland Kitchen Rectal cancer [facility])     Past Surgical History:  Procedure Laterality Date  . APPENDECTOMY  December 2011  . BILATERAL OOPHORECTOMY  December 2011   Completed at time of low anterior resection.  . COLON SURGERY  03/14/10   lap assisted low anterior resection-adenocarcinoma of the rectum and a villous adenoma  . COLONOSCOPY  2011,2012,2015   Dr. Vira Agar, Dr. Addison Lank)  . COLONOSCOPY WITH PROPOFOL N/A 01/05/2020   Procedure: COLONOSCOPY WITH PROPOFOL;  Surgeon: Robert Bellow, MD;  Location: [facility] ENDOSCOPY;  Service: Endoscopy;  Laterality: N/A;    Social History Social History   Tobacco Use  . Smoking status: Former Smoker    Packs/day: 2.00    Years: 30.00    Pack years: 60.00    Types: Cigarettes    Quit date: 05/20/1997    Years since quitting: 22.6  . Smokeless tobacco: Former Network engineer  . Vaping Use: Never used  Substance Use Topics  . Alcohol use: No  . Drug use: No    Family History Family History  Problem Relation Age of Onset  . Hypertension Mother        deceased, age 30  . Cancer Father        gastric  cancer, deceased, mid 72's  . Breast cancer Neg Hx   No family history of bleeding/clotting disorders, porphyria or autoimmune disease   Allergies  Allergen Reactions  . Fish Oil Other (See Comments)    Disruptions to Balance     REVIEW OF SYSTEMS (Negative unless checked)  Constitutional: [] Weight loss  [] Fever  [] Chills Cardiac: [] Chest pain   [] Chest pressure   [] Palpitations   [] Shortness of breath when laying flat   [] Shortness of breath with exertion. Vascular:  [] Pain in legs with walking   [] Pain in legs at rest  [] History of DVT   [] Phlebitis   [] Swelling in legs   [] Varicose veins   [] Non-healing ulcers Pulmonary:   [] Uses home oxygen   [] Productive cough   [] Hemoptysis   [] Wheeze  [] COPD   [] Asthma Neurologic:  [] Dizziness   [] Seizures   [] History of stroke   [x] History of TIA  [x] Aphasia   [] Vissual changes   [] Weakness or numbness in arm   [] Weakness or numbness in leg Musculoskeletal:   [] Joint swelling   [] Joint pain   [] Low back pain Hematologic:  [] Easy bruising  [] Easy bleeding   [] Hypercoagulable state   [] Anemic Gastrointestinal:  [] Diarrhea   [] Vomiting  [x] Gastroesophageal reflux/heartburn   [] Difficulty swallowing. Genitourinary:  [] Chronic kidney disease   [] Difficult urination  [] Frequent urination   [] Blood in urine Skin:  [] Rashes   [] Ulcers  Psychological:  [] History of anxiety   []  History of major depression.  Physical Examination  Vitals:   01/10/20 1107  BP: (!) 174/71  Pulse: 60  Resp: 16  Weight: 171 lb 9.6 oz (77.8 kg)   Body mass index is 29.46 kg/m. Gen: WD/WN, NAD Head: [place]/AT, No temporalis wasting.  Ear/Nose/Throat: Hearing grossly intact, nares w/o erythema or drainage, poor dentition Eyes: PER, EOMI, sclera nonicteric.  Neck: Supple, no masses.  No bruit or JVD.  Pulmonary:  Good air movement, clear to auscultation bilaterally, no use of accessory muscles.  Cardiac: RRR, normal S1, S2, no Murmurs. Vascular: no bruits noted Vessel  Right Left  Radial Palpable Palpable  Carotid Palpable Palpable  Gastrointestinal: soft, non-distended. No guarding/no peritoneal signs.  Musculoskeletal: M/S 5/5 throughout.  No deformity or atrophy.  Neurologic: CN 2-12 intact. Pain and light touch intact in extremities.  Symmetrical.  Speech is fluent. Motor exam as listed above. Psychiatric: Judgment intact, Mood & affect appropriate for pt's clinical situation. Dermatologic: No rashes or ulcers noted.  No changes consistent with cellulitis.  CBC Lab Results  Component Value Date   WBC 5.8 01/03/2020   HGB 12.5 01/03/2020   HCT 37.5 01/03/2020   MCV 89.3 01/03/2020   PLT 159 01/03/2020    BMET    Component Value Date/Time   NA 138 01/03/2020 1222   NA 140  02/20/2015 1138   K 4.4 01/03/2020 1222   CL 105 01/03/2020 1222   CO2 25 01/03/2020 1222   GLUCOSE 97 01/03/2020 1222   BUN 20 01/03/2020 1222   BUN 16 02/20/2015 1138   CREATININE 0.82 01/03/2020 1222   CREATININE 0.96 (H) 08/17/2019 1003   CALCIUM 9.3 01/03/2020 1222   GFRNONAA >60 01/03/2020 1222   GFRNONAA 58 (L) 08/17/2019 1003   GFRAA >60 01/03/2020 1222   GFRAA 68 08/17/2019 1003   Estimated Creatinine Clearance: 59.8 mL/min (by C-G formula based on SCr of 0.82 mg/dL).  COAG Lab Results  Component Value Date   INR 1.0 01/03/2020    Radiology CT HEAD WO CONTRAST  Result Date: 01/03/2020 CLINICAL DATA:  Episode of difficulty speaking and blurry vision yesterday at 2 p.m. EXAM: CT HEAD WITHOUT CONTRAST TECHNIQUE: Contiguous axial images were obtained from the base of the skull through the vertex without intravenous contrast. COMPARISON:  None. FINDINGS: Brain: No acute abnormality including hemorrhage, infarct, midline shift or abnormal extra-axial fluid collection is identified. Chronic microvascular ischemic change is seen. There is a calcified lesion along the right side of the anterior falx measuring 0.7 cm transverse x 1 cm AP x 0.8 cm craniocaudal most  consistent with a meningioma. No surrounding edema. Vascular: No hyperdense vessel or unexpected calcification. Skull: Intact.  No focal lesion. Sinuses/Orbits: Status post cataract surgery.  Otherwise negative. Other: None. IMPRESSION: No acute abnormality. Chronic microvascular ischemic change. Small right parafalcine meningioma. Electronically Signed   By: Inge Rise M.D.   On: 01/03/2020 12:43      Assessment/Plan 1. TIA (transient ischemic attack) Recommend:  Given the patient's has minimal stenosis no further invasive testing or surgery at this time.  Duplex ultrasound shows <20% stenosis bilaterally.  Continue antiplatelet therapy as prescribed Continue management of CAD, HTN and Hyperlipidemia Healthy heart diet,  encouraged exercise at least 4 times per week  Given the stable <20% bilateral carotid stenosis in association with the patient's age the patient will follow up PRN.  The patient is told that if symptoms of a TIA should occur then he should go to the ER and I should be notified, as this would change the management course.  The patient voices understanding.  Perhaps this represents a cardiac source.   2. Essential hypertension Continue antihypertensive medications as already ordered, these medications have been reviewed and there are no changes at this time.   3. Mixed hyperlipidemia Continue statin as ordered and reviewed, no changes at this time   4. Gastroesophageal reflux disease without esophagitis Continue PPI as already ordered, this medication has been reviewed and there are no changes at this time.  Avoidence of caffeine and alcohol  Moderate elevation of the head of the bed    Hortencia Pilar, MD  01/10/2020 11:36 AM

## 2020-01-11 ENCOUNTER — Telehealth (INDEPENDENT_AMBULATORY_CARE_PROVIDER_SITE_OTHER): Payer: Medicare Other | Admitting: Family Medicine

## 2020-01-11 ENCOUNTER — Encounter: Payer: Self-pay | Admitting: Family Medicine

## 2020-01-11 DIAGNOSIS — I10 Essential (primary) hypertension: Secondary | ICD-10-CM | POA: Diagnosis not present

## 2020-01-11 DIAGNOSIS — D329 Benign neoplasm of meninges, unspecified: Secondary | ICD-10-CM | POA: Diagnosis not present

## 2020-01-11 MED ORDER — LISINOPRIL 40 MG PO TABS: 40.0000 mg | ORAL_TABLET | Freq: Every day | ORAL | 1 refills | Status: DC

## 2020-01-11 NOTE — Patient Instructions (Addendum)
    Please schedule a Follow-up Appointment to: Return in about 3 months (around 04/12/2020) for 3 month HTN.  If you have any other questions or concerns, please feel free to call the office or send a message through [place]. You may also schedule an earlier appointment if necessary.  Additionally, you may be receiving a survey about your experience at our office within a few days to 1 week by e-mail or mail. We value your feedback.  Nobie Putnam, DO [place]

## 2020-01-11 NOTE — Progress Notes (Signed)
Virtual Visit via Telephone The purpose of this virtual visit is to provide medical care while limiting exposure to the novel coronavirus (COVID19) for both patient and office staff.  Consent was obtained for phone visit:  Yes.   Answered questions that patient had about telehealth interaction:  Yes.   I discussed the limitations, risks, security and privacy concerns of performing an evaluation and management service by telephone. I also discussed with the patient that there may be a patient responsible charge related to this service. The patient expressed understanding and agreed to proceed.  Patient Location: Home Provider Location: [facility])  ---------------------------------------------------------------------- Chief Complaint  Patient presents with  . Results  . Hypertension    S: Reviewed CMA documentation. I have called patient and gathered additional HPI as follows:  CHRONIC HTN Systolic Murmur, history of rheumatic fever History of possible TIA  seen Cardiology 12/17/19, reviewed ECHO results, intact systolic function, some impaired relaxation, mild LVH, mild AR, repeat in 3-5 years, considering monitor in future.  She was seen on 8/16 for acute visit in ED for concerns of blurry vision, confusion worry for stroke, had EKG labs Head CT, ultimately negative, left ED without being seen. Contacted our office and followed up now today about 1 week later.   Reports elevated BP reading was up to 170s, recently lowered dose Lisinopril from 40 to 20mg . She now says higher BP reading Current Meds - Lisinopril 20mg  daily    Reports good compliance, took meds today. Tolerating well, w/o complaints. Denies CP, dyspnea, HA, edema, dizziness / lightheadedness  Reviewed recent Colonoscopy report, 01/05/20 - from Dr Bary Castilla, 3 polyps, tubular adenoma and hyperplastic, negative, repeat 5 year if need.  Saw Dr Delana Meyer yesterday on 01/10/20 - Vascular thinks she had  mini stroke TIA, they did carotid dopplers already on 8/23 and these were reported as negative.  Denies any known or suspected exposure to person with or possibly with COVID19.  Denies any fevers, chills, sweats, body ache, cough, shortness of breath, sinus pain or pressure, headache, abdominal pain, diarrhea  Past Medical History:  Diagnosis Date  . Abdominal pain, left lower quadrant 2012  . Allergy   . Breast screening, unspecified   . H/O cystitis 2011  . History of colon cancer   . Hyperlipidemia   . Hypertension   . Nausea with vomiting   . Personal history of malignant neoplasm of large intestine 2013  . Personal history of tobacco use, presenting hazards to health   . Rectal cancer ([place]) 04/2010   Adenocarcinoma arising in a villous adenoma, PT 1, N0.  Marland Kitchen Rectal cancer [facility])    Social History   Tobacco Use  . Smoking status: Former Smoker    Packs/day: 2.00    Years: 30.00    Pack years: 60.00    Types: Cigarettes    Quit date: 05/20/1997    Years since quitting: 22.6  . Smokeless tobacco: Former Network engineer  . Vaping Use: Never used  Substance Use Topics  . Alcohol use: No  . Drug use: No    Current Outpatient Medications:  .  cholecalciferol (VITAMIN D3) 25 MCG (1000 UT) tablet, Take 1,000 Units by mouth daily., Disp: , Rfl:  .  fluticasone (FLONASE) 50 MCG/ACT nasal spray, Place 2 sprays into both nostrils daily. Use for 4-6 weeks then stop and use seasonally or as needed., Disp: 48 g, Rfl: 1 .  lisinopril (ZESTRIL) 40 MG tablet, Take 1 tablet (40  mg total) by mouth at bedtime., Disp: 90 tablet, Rfl: 1 .  loratadine (CLARITIN) 10 MG tablet, Take 1 tablet (10 mg total) by mouth daily. Use for 4-6 weeks then stop, and use as needed or seasonally, Disp: 90 tablet, Rfl: 1 .  lovastatin (MEVACOR) 20 MG tablet, Take 1 tablet (20 mg total) by mouth at bedtime., Disp: 90 tablet, Rfl: 3 .  Multiple Vitamins-Minerals (ZINC PO), Take by mouth daily., Disp: , Rfl:  .   omeprazole (PRILOSEC) 20 MG capsule, Take 1 capsule (20 mg total) by mouth daily before breakfast., Disp: 90 capsule, Rfl: 1 .  Probiotic Product (PROBIOTIC DAILY PO), Take 1 tablet by mouth daily., Disp: , Rfl:  .  vitamin B-12 (CYANOCOBALAMIN) 1000 MCG tablet, Take 1,000 mcg by mouth daily., Disp: , Rfl:   Depression screen [facility] 2/9 01/11/2020 08/17/2019 08/05/2019  Decreased Interest 0 0 0  Down, Depressed, Hopeless 0 0 0  PHQ - 2 Score 0 0 0    No flowsheet data found.  -------------------------------------------------------------------------- O: No physical exam performed due to remote telephone encounter.  Lab results reviewed.  I have personally reviewed the radiology report from [place] wo contrast on 01/03/20.  CLINICAL DATA:  Episode of difficulty speaking and blurry vision yesterday at 2 p.m.  EXAM: CT HEAD WITHOUT CONTRAST  TECHNIQUE: Contiguous axial images were obtained from the base of the skull through the vertex without intravenous contrast.  COMPARISON:  None.  FINDINGS: Brain: No acute abnormality including hemorrhage, infarct, midline shift or abnormal extra-axial fluid collection is identified. Chronic microvascular ischemic change is seen. There is a calcified lesion along the right side of the anterior falx measuring 0.7 cm transverse x 1 cm AP x 0.8 cm craniocaudal most consistent with a meningioma. No surrounding edema.  Vascular: No hyperdense vessel or unexpected calcification.  Skull: Intact.  No focal lesion.  Sinuses/Orbits: Status post cataract surgery.  Otherwise negative.  Other: None.  IMPRESSION: No acute abnormality.  Chronic microvascular ischemic change.  Small right parafalcine meningioma.   Electronically Signed   By: Inge Rise M.D.   On: 01/03/2020 12:43   Recent Results (from the past 2160 hour(s))  Urinalysis, Complete     Status: Abnormal   Collection Time: 11/08/19  8:58 AM  Result Value Ref  Range   Specific Gravity, UA 1.020 1.005 - 1.030   pH, UA 5.0 5.0 - 7.5   Color, UA Yellow Yellow   Appearance Ur Cloudy (A) Clear   Leukocytes,UA 2+ (A) Negative   Protein,UA Negative Negative/Trace   Glucose, UA Negative Negative   Ketones, UA Negative Negative   RBC, UA Negative Negative   Bilirubin, UA Negative Negative   Urobilinogen, Ur 0.2 0.2 - 1.0 mg/dL   Nitrite, UA Negative Negative   Microscopic Examination See below:   Microscopic Examination     Status: Abnormal   Collection Time: 11/08/19  8:58 AM   Urine  Result Value Ref Range   WBC, UA 6-10 (A) 0 - 5 /hpf   RBC None seen 0 - 2 /hpf   Epithelial Cells (non renal) >10 (A) 0 - 10 /hpf   Renal Epithel, UA 0-10 (A) None seen /hpf   Bacteria, UA Few None seen/Few  CULTURE, URINE COMPREHENSIVE     Status: None   Collection Time: 11/08/19  9:23 AM   Specimen: Urine   UR  Result Value Ref Range   Urine Culture, Comprehensive Final report    Organism ID, Bacteria  Comment     Comment: Mixed urogenital flora 10,000-25,000 colony forming units per mL   ECHOCARDIOGRAM COMPLETE     Status: None   Collection Time: 12/09/19 12:53 PM  Result Value Ref Range   AR max vel 3.19 cm2   AV Peak grad 5.3 mmHg   Ao pk vel 1.15 m/s   S' Lateral 1.70 cm   Area-P 1/2 2.28 cm2   AV Area VTI 2.95 cm2   AV Mean grad 3.0 mmHg   Single Plane A4C EF 55.9 %   Single Plane A2C EF 54.9 %   Calc EF 55.2 %   AV Area mean vel 3.02 cm2  SARS CORONAVIRUS 2 (TAT 6-24 HRS) Nasopharyngeal Nasopharyngeal Swab     Status: None   Collection Time: 01/03/20 11:21 AM   Specimen: Nasopharyngeal Swab  Result Value Ref Range   SARS Coronavirus 2 NEGATIVE NEGATIVE    Comment: (NOTE) SARS-CoV-2 target nucleic acids are NOT DETECTED.  The SARS-CoV-2 RNA is generally detectable in upper and lower respiratory specimens during the acute phase of infection. Negative results do not preclude SARS-CoV-2 infection, do not rule out co-infections with other  pathogens, and should not be used as the sole basis for treatment or other patient management decisions. Negative results must be combined with clinical observations, patient history, and epidemiological information. The expected result is Negative.  Fact Sheet for Patients: SugarRoll.be  Fact Sheet for Healthcare Providers: https://www.woods-mathews.com/  This test is not yet approved or cleared by the [place] FDA and  has been authorized for detection and/or diagnosis of SARS-CoV-2 by FDA under an Emergency Use Authorization (EUA). This EUA will remain  in effect (meaning this test can be used) for the duration of the COVID-19 declaration under Se ction 564(b)(1) of the Act, 21 U.S.C. section 360bbb-3(b)(1), unless the authorization is terminated or revoked sooner.  Performed at [facility], [place] [address] [place], [place] [address]   Protime-INR     Status: None   Collection Time: 01/03/20 12:22 PM  Result Value Ref Range   Prothrombin Time 12.3 11.4 - 15.2 seconds   INR 1.0 0.8 - 1.2    Comment: (NOTE) INR goal varies based on device and disease states. Performed at [facility], [address] [place], [place] [address]   APTT     Status: None   Collection Time: 01/03/20 12:22 PM  Result Value Ref Range   aPTT 30 24 - 36 seconds    Comment: Performed at [facility], [address] [place], [place] [address]  CBC     Status: None   Collection Time: 01/03/20 12:22 PM  Result Value Ref Range   WBC 5.8 4.0 - 10.5 K/uL   RBC 4.20 3.87 - 5.11 MIL/uL   Hemoglobin 12.5 12.0 - 15.0 g/dL   HCT 37.5 36 - 46 %   MCV 89.3 80.0 - 100.0 fL   MCH 29.8 26.0 - 34.0 pg   MCHC 33.3 30.0 - 36.0 g/dL   RDW 13.1 11.5 - 15.5 %   Platelets 159 150 - 400 K/uL   nRBC 0.0 0.0 - 0.2 %    Comment: Performed at [facility], [address] [place],  [address]  Differential     Status: None    Collection Time: 01/03/20 12:22 PM  Result Value Ref Range   Neutrophils Relative % 45 %   Neutro Abs 2.7 1.7 - 7.7 K/uL   Lymphocytes Relative 44 %  Lymphs Abs 2.6 0.7 - 4.0 K/uL   Monocytes Relative 5 %   Monocytes Absolute 0.3 0 - 1 K/uL   Eosinophils Relative 4 %   Eosinophils Absolute 0.2 0 - 0 K/uL   Basophils Relative 1 %   Basophils Absolute 0.1 0 - 0 K/uL   Immature Granulocytes 1 %   Abs Immature Granulocytes 0.03 0.00 - 0.07 K/uL    Comment: Performed at [facility], [address], [place], [place] [address]  Comprehensive metabolic panel     Status: Abnormal   Collection Time: 01/03/20 12:22 PM  Result Value Ref Range   Sodium 138 135 - 145 mmol/L   Potassium 4.4 3.5 - 5.1 mmol/L   Chloride 105 98 - 111 mmol/L   CO2 25 22 - 32 mmol/L   Glucose, Bld 97 70 - 99 mg/dL    Comment: Glucose reference range applies only to samples taken after fasting for at least 8 hours.   BUN 20 8 - 23 mg/dL   Creatinine, Ser 0.82 0.44 - 1.00 mg/dL   Calcium 9.3 8.9 - 10.3 mg/dL   Total Protein 8.0 6.5 - 8.1 g/dL   Albumin 3.9 3.5 - 5.0 g/dL   AST 36 15 - 41 U/L   ALT 30 0 - 44 U/L   Alkaline Phosphatase 236 (H) 38 - 126 U/L   Total Bilirubin 0.9 0.3 - 1.2 mg/dL   GFR calc non Af Amer >60 >60 mL/min   GFR calc Af Amer >60 >60 mL/min   Anion gap 8 5 - 15    Comment: Performed at [facility], [address], [place], [place] [address]  Surgical pathology     Status: None   Collection Time: 01/05/20  8:39 AM  Result Value Ref Range   SURGICAL PATHOLOGY      SURGICAL PATHOLOGY CASE: [id number] PATIENT: Lynnea Maizes Surgical Pathology Report     Specimen Submitted: A. Appendiceal orifice polyp; cbx B. Colon polyp, transverse; cbx C. Colon polyp, sigmoid; cbx  Clinical History: History of colon cancer.  Polyps, diverticulosis      DIAGNOSIS: A. APPENDICEAL ORIFICE POLYP; COLD BIOPSY: - TUBULAR ADENOMA. - NEGATIVE FOR HIGH-GRADE DYSPLASIA AND  MALIGNANCY. - DEEPER SECTIONS EXAMINED.  B.  COLON POLYP, TRANSVERSE; COLD BIOPSY: - TUBULAR ADENOMA. - NEGATIVE FOR HIGH-GRADE DYSPLASIA AND MALIGNANCY.  C.  COLON POLYP, SIGMOID; COLD BIOPSY: - HYPERPLASTIC POLYP. - NEGATIVE FOR DYSPLASIA AND MALIGNANCY. - DEEPER SECTIONS EXAMINED.  GROSS DESCRIPTION: A. Labeled: cbx polyp appendiceal orifice Received: Formalin Tissue fragment(s): Multiple Size: Aggregate, 0.9 x 0.8 x 0.2 cm Description: Tan soft tissue fragments Entirely submitted in 1 cassette.  B. Labeled: cbx transverse colon polyp Received: Formalin T issue fragment(s): 1 Size: 0.7 cm Description: Tan soft tissue fragment Entirely submitted in 1 cassette.  C. Labeled: cbx sigmoid colon polyp Received: Formalin Tissue fragment(s): Multiple Size: Aggregate, 0.9 x 0.7 x 0.2 cm Description: Tan soft tissue fragments Entirely submitted in 1 cassette.   Final Diagnosis performed by Quay Burow, MD.   Electronically signed 01/06/2020 2:53:40PM The electronic signature indicates that the named Attending Pathologist has evaluated the specimen Technical component performed at [facility], [address] [place], [place] [address] Lab: [phone] Dir: Rush Farmer, MD, MMM  Professional component performed at [facility], [facility], [place], [place], [place] [address] Lab: [phone] Dir: Dellia Nims. Reuel Derby, MD     -------------------------------------------------------------------------- A&P:  Problem List Items Addressed This Visit    Meningioma ([place])  Essential hypertension - Primary   Relevant Medications   lisinopril (ZESTRIL) 40 MG tablet     #Meningioma, small Suspected incidental finding on CT, no comparison at this time She was already referred to [facility] Dr Izora Ribas, soon for further evaluation Pending at this time.  #HTN Elevated readings up to SBP 170 Recent concern for TIA episode, since resolved Advised  her today to double dose of Lisinopril from 20 to 40mg  - may take existing 40s, she used to be on it, but it was lowered recently She may follow-up with Cardiology in future if new concerns, she had work up with ECHO for murmur already. She says may consider return to them for heart monitor if worsening or new problem.  Meds ordered this encounter  Medications  . lisinopril (ZESTRIL) 40 MG tablet    Sig: Take 1 tablet (40 mg total) by mouth at bedtime.    Dispense:  90 tablet    Refill:  1    Dose increase again back from 20 to 40    Follow-up: - Return in 3 months for HTN  Patient verbalizes understanding with the above medical recommendations including the limitation of remote medical advice.  Specific follow-up and call-back criteria were given for patient to follow-up or seek medical care more urgently if needed.   - Time spent in direct consultation with patient on phone: 15 minutes  Nobie Putnam, [facility] 01/11/2020, 2:06 PM

## 2020-01-20 DIAGNOSIS — D329 Benign neoplasm of meninges, unspecified: Secondary | ICD-10-CM | POA: Diagnosis not present

## 2020-02-22 ENCOUNTER — Other Ambulatory Visit: Payer: Self-pay | Admitting: Family Medicine

## 2020-02-22 DIAGNOSIS — K219 Gastro-esophageal reflux disease without esophagitis: Secondary | ICD-10-CM

## 2020-02-29 ENCOUNTER — Other Ambulatory Visit: Payer: Self-pay | Admitting: Neurosurgery

## 2020-02-29 DIAGNOSIS — D329 Benign neoplasm of meninges, unspecified: Secondary | ICD-10-CM

## 2020-03-15 ENCOUNTER — Other Ambulatory Visit: Payer: Self-pay | Admitting: Neurosurgery

## 2020-03-19 ENCOUNTER — Other Ambulatory Visit: Payer: Self-pay

## 2020-03-19 ENCOUNTER — Ambulatory Visit
Admission: RE | Admit: 2020-03-19 | Discharge: 2020-03-19 | Disposition: A | Discharge status: Home / Self Care | Payer: Medicare Other | Source: Ambulatory Visit | Attending: Neurosurgery | Admitting: Neurosurgery

## 2020-03-19 DIAGNOSIS — D329 Benign neoplasm of meninges, unspecified: Secondary | ICD-10-CM | POA: Insufficient documentation

## 2020-03-19 IMAGING — MR MR HEAD WO/W CM
15 series · 45 of 48 positions shown · IV contrast (gadavist)
Comparison: CT head [DATE]

CLINICAL DATA: Difficulty speaking blurry vision.

EXAM:
MRI HEAD WITHOUT AND WITH CONTRAST
TECHNIQUE: Multiplanar, multiecho pulse sequences of the brain and surrounding
structures were obtained without and with intravenous contrast.
CONTRAST:  7.5mL GADAVIST GADOBUTROL 1 MMOL/ML IV SOLN

[Series 5: ax dwi_tracew · axial · 3.0mm · 0.71mm/px · z∈[-61,+96]mm · 4 of 54 slices shown]
[im 1/54]
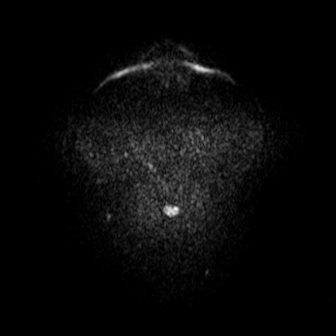
[im 18/54]
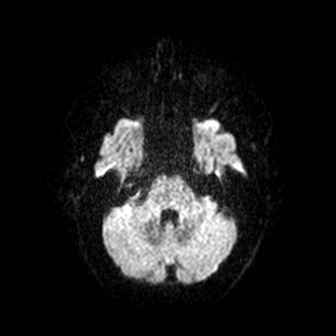
[im 36/54]
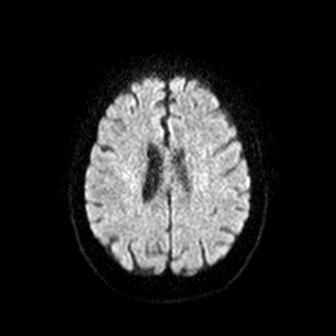
[im 54/54]
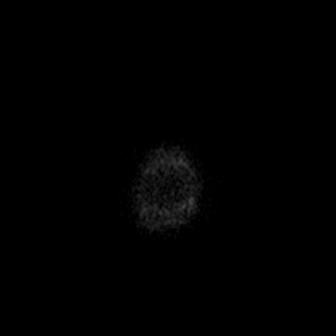

[Series 6: ax dwi_adc · axial · 3.0mm · 0.71mm/px · z∈[-61,+96]mm · 3 of 54 slices shown]
[im 1/54]
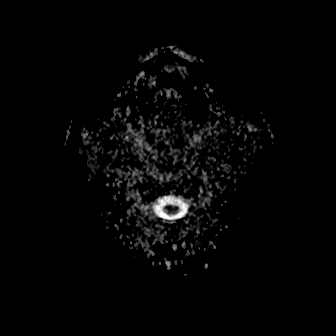
[im 27/54]
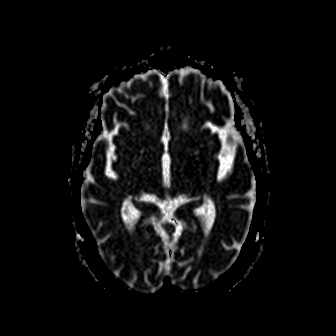
[im 54/54]
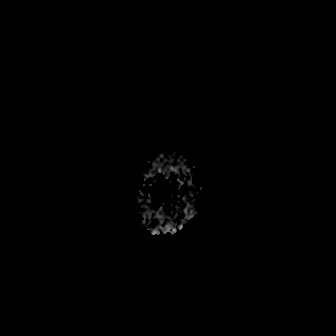

[Series 7: cor dwi_tracew · coronal · 5.0mm · 0.68mm/px · 2 of 38 slices shown]
[im 1/38]
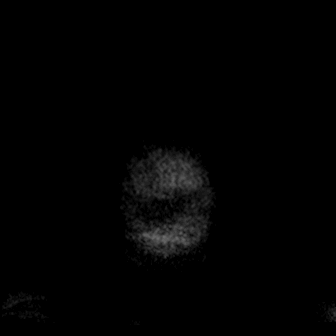
[im 38/38]
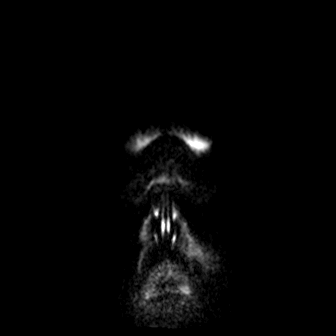

[Series 8: cor dwi_adc · coronal · 5.0mm · 0.68mm/px · 2 of 38 slices shown]
[im 1/38]
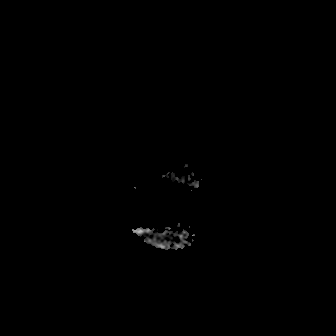
[im 38/38]
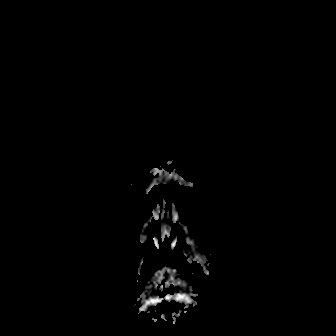

[Series 9: T1 · sagittal · 5.0mm · 0.62mm/px · 1 of 23 slices shown (1 of 2)]
[im 1/23]
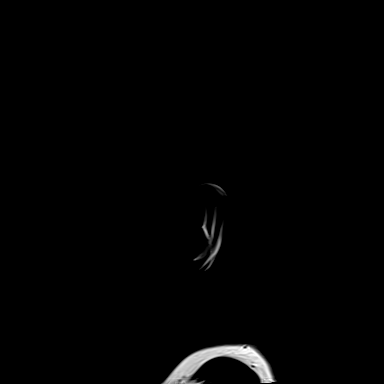

[Series 10: T2 · axial · 5.0mm · 0.53mm/px · 1 of 27 slices shown]
[im 1/27]
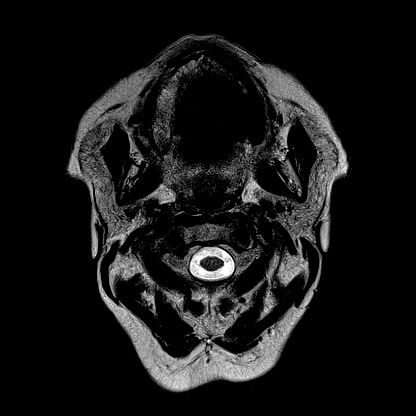

[Series 12: pha_images · axial · 3.0mm · 0.90mm/px · z∈[-63,+97]mm · 3 of 55 slices shown]
[im 1/55]
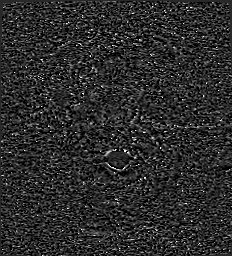
[im 28/55]
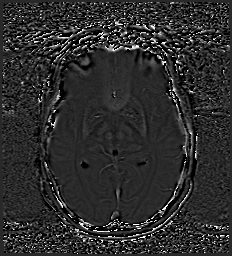
[im 55/55]
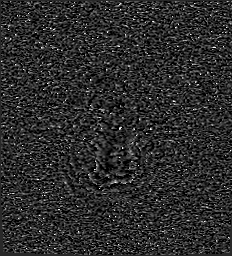

[Series 13: swi_images · axial · 3.0mm · 0.90mm/px · z∈[-63,+100]mm · 3 of 56 slices shown]
[im 1/56]
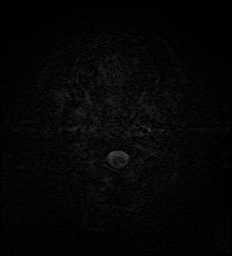
[im 28/56]
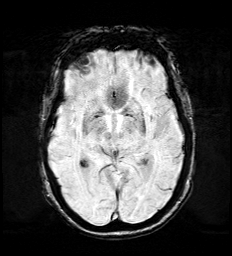
[im 56/56]
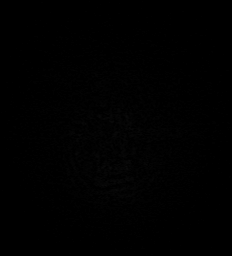

[Series 15: FLAIR · axial · 3.0mm · 0.53mm/px · z∈[-63,+97]mm · 3 of 55 slices shown]
[im 1/55]
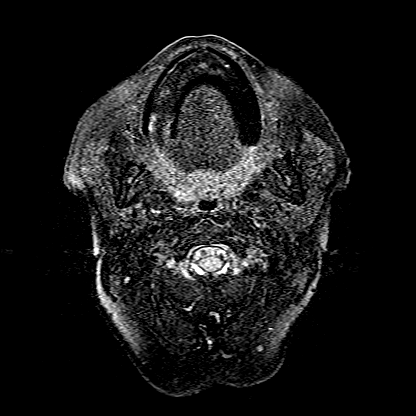
[im 28/55]
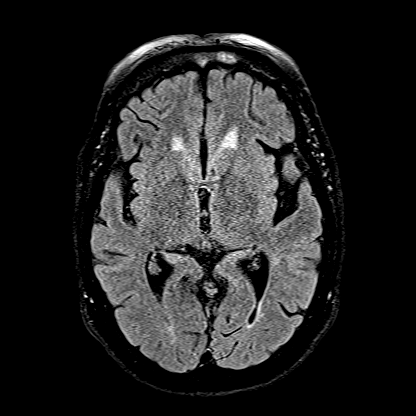
[im 55/55]
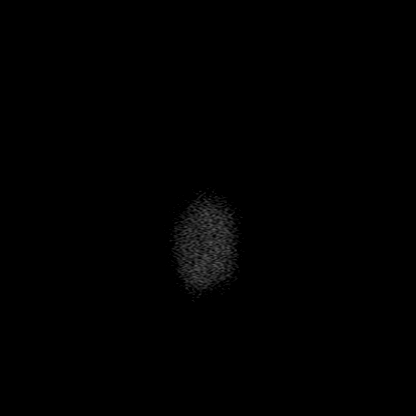

[Series 16: T1 · axial · 1.0mm · 0.98mm/px · z∈[-67,+106]mm · 8 of 176 slices shown (2 of 2)]
[im 1/176]
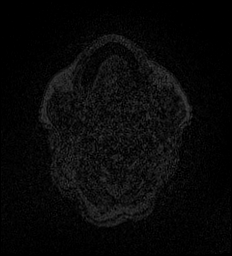
[im 22/176]
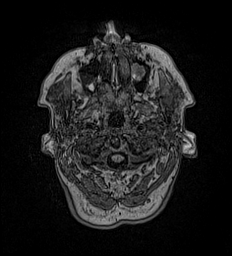
[im 44/176]
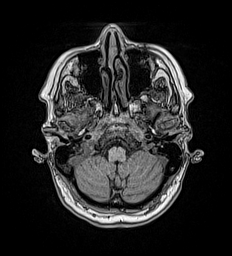
[im 66/176]
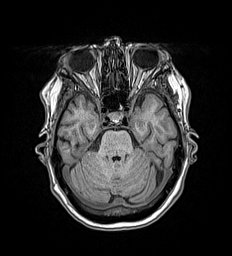
[im 110/176]
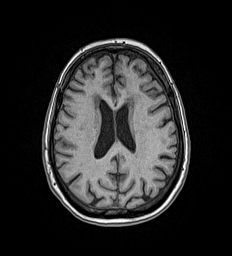
[im 132/176]
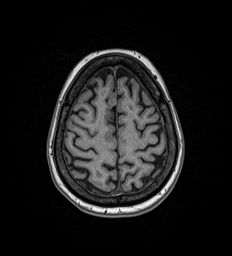
[im 154/176]
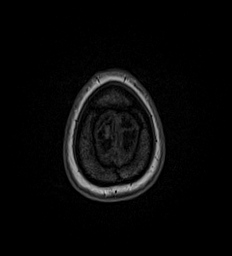
[im 176/176]
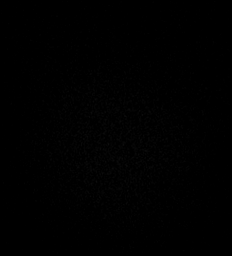

[Series 17: T2 post-contrast · coronal · 5.0mm · 0.57mm/px · 2 of 29 slices shown]
[im 1/29]
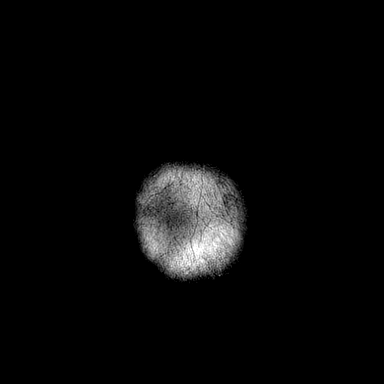
[im 29/29]
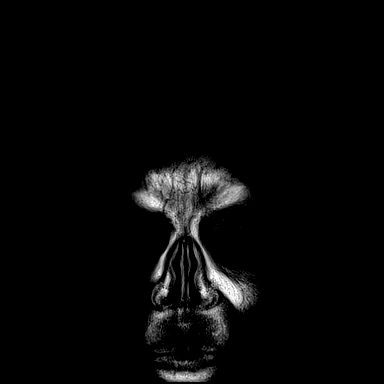

[Series 18: T1 post-contrast · axial · 1.0mm · 0.98mm/px · z∈[-67,+106]mm · 9 of 176 slices shown (1 of 3)]
[im 1/176]
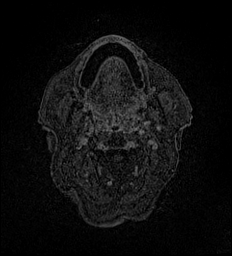
[im 22/176]
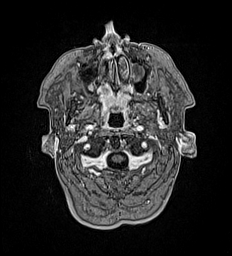
[im 44/176]
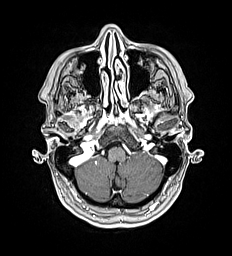
[im 66/176]
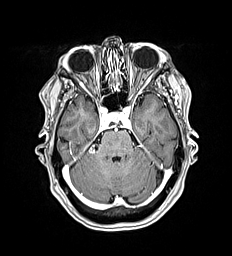
[im 88/176]
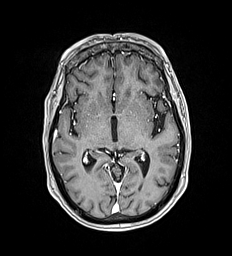
[im 110/176]
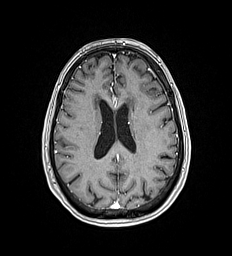
[im 132/176]
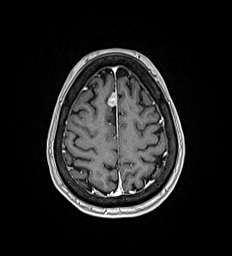
[im 154/176]
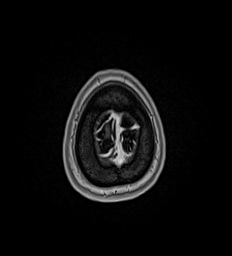
[im 176/176]
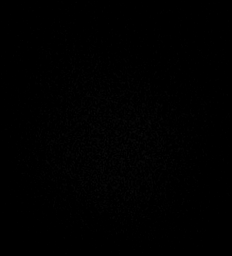

[Series 19: T1 post-contrast · coronal · 5.0mm · 0.57mm/px · 2 of 29 slices shown (2 of 3)]
[im 1/29]
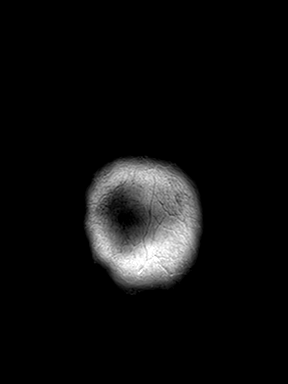
[im 29/29]
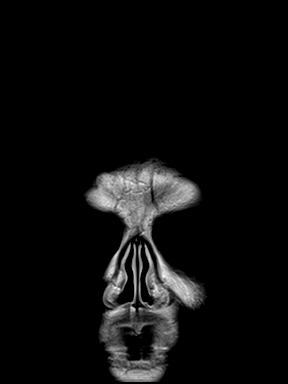

[Series 20: T1 post-contrast · sagittal · 5.0mm · 0.62mm/px · 1 of 23 slices shown (3 of 3)]
[im 1/23]
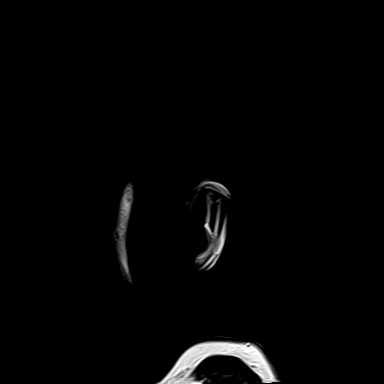

[Series 21: t2_space_tra_(id)_iso · axial · 0.6mm · 0.30mm/px · 1 of 60 slices shown]
[im 1/60]
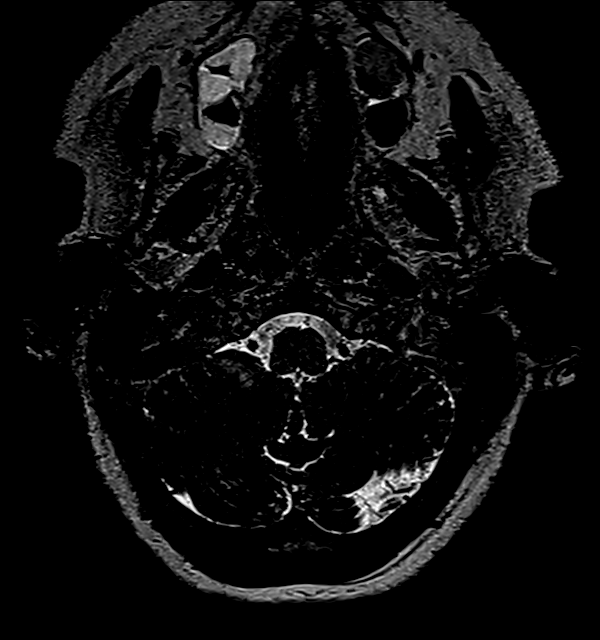

[45 of 48 positions shown; findings below may reference images not displayed]

FINDINGS: Brain: No acute infarction, hemorrhage, hydrocephalus, or
extra-axial collection. Mild to moderate scattered T2/FLAIR
hyperintensities within the white matter, compatible with chronic
microvascular ischemic disease. Approximately 1.4 x 0.9 cm
homogeneously enhancing dural-based extra-axial mass along the right
aspect of the falx (series 18, image 132). No substantial mass
effect or surrounding edema. There is an approximately 1.7 x 1.6 x
1.6 cm heterogeneously enhancing lesion at the right
cerebellopontine angle which involves the cranial [DATE] nerves and
extends into the right IAC, mildly widening the porous acousticus.
Mild mass effect on the pons.

Vascular: Major arterial flow voids are maintained at the skull
base.

Skull and upper cervical spine: Normal marrow signal.

Sinuses/Orbits: Right maxillary sinus retention cyst. Mild mucosal
thickening. No air-fluid levels. Unremarkable orbits.

Other: No mastoid effusions.
IMPRESSION: 1. No acute intracranial abnormality.  No acute infarct.
2. Approximately 1.7 cm heterogeneously enhancing right
cerebellopontine angle mass with extension into the right IAC, most
likely a vestibular schwannoma.
3. Approximately 1.4 cm right parafalcine meningioma without
substantial mass effect or surrounding edema.
4. Chronic microvascular ischemic change.

## 2020-03-19 MED ORDER — GADOBUTROL 1 MMOL/ML IV SOLN
7.5000 mL | Freq: Once | INTRAVENOUS | Status: AC | PRN
  Administered 2020-03-19: 7.5 mL via INTRAVENOUS

## 2020-04-03 ENCOUNTER — Telehealth: Payer: Self-pay | Admitting: Family Medicine

## 2020-04-03 NOTE — Telephone Encounter (Signed)
Copied from [place] [phone]. Topic: Medicare AWV >> Apr 03, 2020  2:43 PM Cher Nakai R wrote: Reason for CRM:   No answer unable to leave message for patient to call back and schedule the Medicare Annual Wellness Visit (AWV) virtually.  Last AWV 07/14/2018  Please schedule at anytime with [facility].  40 minute appointment  Any questions, please call me at [phone]

## 2020-04-17 ENCOUNTER — Other Ambulatory Visit: Payer: Self-pay

## 2020-04-17 ENCOUNTER — Ambulatory Visit (INDEPENDENT_AMBULATORY_CARE_PROVIDER_SITE_OTHER): Payer: Medicare Other | Admitting: Family Medicine

## 2020-04-17 ENCOUNTER — Other Ambulatory Visit: Payer: Self-pay | Admitting: Family Medicine

## 2020-04-17 ENCOUNTER — Encounter: Payer: Self-pay | Admitting: Family Medicine

## 2020-04-17 DIAGNOSIS — R7309 Other abnormal glucose: Secondary | ICD-10-CM

## 2020-04-17 DIAGNOSIS — E782 Mixed hyperlipidemia: Secondary | ICD-10-CM

## 2020-04-17 DIAGNOSIS — I10 Essential (primary) hypertension: Secondary | ICD-10-CM

## 2020-04-17 DIAGNOSIS — E538 Deficiency of other specified B group vitamins: Secondary | ICD-10-CM

## 2020-04-17 DIAGNOSIS — Z0000 Encounter for general adult medical examination without abnormal findings: Secondary | ICD-10-CM

## 2020-04-17 DIAGNOSIS — E559 Vitamin D deficiency, unspecified: Secondary | ICD-10-CM

## 2020-04-17 MED ORDER — LISINOPRIL 40 MG PO TABS: 40.0000 mg | ORAL_TABLET | Freq: Every day | ORAL | 1 refills | Status: DC

## 2020-04-17 NOTE — Patient Instructions (Addendum)
Thank you for coming to the office today.  Keep on Lisinopril 40mg  daily  May take Zinc for immune protection  We can check B12 next time.  DUE for FASTING BLOOD WORK (no food or drink after midnight before the lab appointment, only water or coffee without cream/sugar on the morning of)  SCHEDULE "Lab Only" visit in the morning at the clinic for lab draw in 4 MONTHS   - Make sure Lab Only appointment is at about 1 week before your next appointment, so that results will be available  For Lab Results, once available within 2-3 days of blood draw, you can can log in to MyChart online to view your results and a brief explanation. Also, we can discuss results at next follow-up visit.   Please schedule a Follow-up Appointment to: Return in about 4 months (around 08/15/2020) for 3-4 months fasting lab only then 1 week later Annual Physical.  If you have any other questions or concerns, please feel free to call the office or send a message through [place]. You may also schedule an earlier appointment if necessary.  Additionally, you may be receiving a survey about your experience at our office within a few days to 1 week by e-mail or mail. We value your feedback.  Nobie Putnam, DO [place]

## 2020-04-17 NOTE — Progress Notes (Signed)
Subjective:    Patient ID: Vanessa Reynolds, female    DOB: [birth year], 73 y.o.   MRN: [id number]  Vanessa Reynolds is a 74 y.o. 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 Suspected to have caused some symptoms of hers. Compared to prior CT.  She has had some sense of sluggishness or tired since that time 12/2019, still has a "foggy" or off balance at times, and occasionally has some difficulty with speech at times or word finding.  Followed by Dr Delana Meyer, Vascular they think may have had TIA/CVA but was not identified on imaging. Had Carotid dopplers and imaging.  She monitors BP regularly, not having any issues with it right now.  She is returning to work now 3 days a week.  She is taking Lisinopril 40mg  daily now.  Reports good compliance, took meds today. Tolerating well, w/o complaints. Denies CP, dyspnea, HA, edema / lightheadedness   Health Maintenance: UTD Flu Vaccine.  Depression screen [facility] 2/9 04/17/2020 01/11/2020 08/17/2019  Decreased Interest 0 0 0  Down, Depressed, Hopeless 0 0 0  PHQ - 2 Score 0 0 0    Social History   Tobacco Use  . Smoking status: Former Smoker    Packs/day: 2.00    Years: 30.00    Pack years: 60.00    Types: Cigarettes    Quit date: 05/20/1997    Years since quitting: 22.9  . Smokeless tobacco: Former Network engineer  . Vaping Use: Never used  Substance Use Topics  . Alcohol use: No  . Drug use: No    Review of Systems Per HPI unless specifically indicated above     Objective:    BP (!) 142/63   Pulse (!) 59   Temp 98.4 F (36.9 C) (Temporal)   Resp 16   Ht 5\' 4"  (1.626 m)   Wt 174 lb (78.9 kg)   SpO2 97%   BMI 29.87 kg/m   Wt Readings from Last 3 Encounters:  04/17/20 174 lb (78.9 kg)  01/10/20 171 lb 9.6 oz (77.8 kg)  01/05/20 168 lb 5.5 oz (76.4  kg)    Physical Exam Vitals and nursing note reviewed.  Constitutional:      General: She is not in acute distress.    Appearance: She is well-developed. She is not diaphoretic.     Comments: Well-appearing, comfortable, cooperative  HENT:     Head: Normocephalic and atraumatic.  Eyes:     General:        Right eye: No discharge.        Left eye: No discharge.     Conjunctiva/sclera: Conjunctivae normal.  Cardiovascular:     Rate and Rhythm: Normal rate.  Pulmonary:     Effort: Pulmonary effort is normal.  Skin:    General: Skin is warm and dry.     Findings: No erythema or rash.  Neurological:     Mental Status: She is alert and oriented to person, place, and time.  Psychiatric:        Behavior: Behavior normal.     Comments: Well groomed, good eye contact, normal speech and thoughts        Results for orders placed or performed during the hospital encounter of 01/05/20  Surgical pathology  Result Value Ref Range   SURGICAL PATHOLOGY  SURGICAL PATHOLOGY CASE: [id number] PATIENT: Lynnea Maizes Surgical Pathology Report     Specimen Submitted: A. Appendiceal orifice polyp; cbx B. Colon polyp, transverse; cbx C. Colon polyp, sigmoid; cbx  Clinical History: History of colon cancer.  Polyps, diverticulosis      DIAGNOSIS: A. APPENDICEAL ORIFICE POLYP; COLD BIOPSY: - TUBULAR ADENOMA. - NEGATIVE FOR HIGH-GRADE DYSPLASIA AND MALIGNANCY. - DEEPER SECTIONS EXAMINED.  B.  COLON POLYP, TRANSVERSE; COLD BIOPSY: - TUBULAR ADENOMA. - NEGATIVE FOR HIGH-GRADE DYSPLASIA AND MALIGNANCY.  C.  COLON POLYP, SIGMOID; COLD BIOPSY: - HYPERPLASTIC POLYP. - NEGATIVE FOR DYSPLASIA AND MALIGNANCY. - DEEPER SECTIONS EXAMINED.  GROSS DESCRIPTION: A. Labeled: cbx polyp appendiceal orifice Received: Formalin Tissue fragment(s): Multiple Size: Aggregate, 0.9 x 0.8 x 0.2 cm Description: Tan soft tissue fragments Entirely submitted in 1 cassette.  B. Labeled: cbx  transverse colon polyp Received: Formalin T issue fragment(s): 1 Size: 0.7 cm Description: Tan soft tissue fragment Entirely submitted in 1 cassette.  C. Labeled: cbx sigmoid colon polyp Received: Formalin Tissue fragment(s): Multiple Size: Aggregate, 0.9 x 0.7 x 0.2 cm Description: Tan soft tissue fragments Entirely submitted in 1 cassette.   Final Diagnosis performed by Quay Burow, MD.   Electronically signed 01/06/2020 2:53:40PM The electronic signature indicates that the named Attending Pathologist has evaluated the specimen Technical component performed at [facility], [address], [place], [place] [address] Lab: [phone] Dir: Rush Farmer, MD, MMM  Professional component performed at [facility], [facility], [place], [place], [place] [address] Lab: [phone] Dir: Dellia Nims. Rubinas, MD       Assessment & Plan:   Problem List Items Addressed This Visit    Essential hypertension    Controlled HTN, mild elevated initial No complications Failed Amlodipine 5mg  intolerance, off HCTZ    Plan:  1. Continue current BP meds - Amlodipine 2.5, Lisinopril 40mg  daily 2. Lifestyle Mods - continue try to improve regular exercise, low sodium diet 3. Contiue Monitor BP at home      Relevant Medications   lisinopril (ZESTRIL) 40 MG tablet      #Additional updates Following neurosurgery for Meningioma follow-up History TIA  Meds ordered this encounter  Medications  . lisinopril (ZESTRIL) 40 MG tablet    Sig: Take 1 tablet (40 mg total) by mouth at bedtime.    Dispense:  90 tablet    Refill:  1    Keep refill on file.      Follow up plan: Return in about 4 months (around 08/15/2020) for 3-4 months fasting lab only then 1 week later Annual Physical.   Future labs ordered for 07/2020 add B12, TSH Vitamin D   Nobie Putnam, DO [facility] 04/17/2020, 11:11 AM

## 2020-04-17 NOTE — Assessment & Plan Note (Signed)
Controlled HTN, mild elevated initial No complications Failed Amlodipine 5mg  intolerance, off HCTZ    Plan:  1. Continue current BP meds - Amlodipine 2.5, Lisinopril 40mg  daily 2. Lifestyle Mods - continue try to improve regular exercise, low sodium diet 3. Contiue Monitor BP at home

## 2020-05-08 ENCOUNTER — Other Ambulatory Visit: Payer: Self-pay | Admitting: Family Medicine

## 2020-05-08 DIAGNOSIS — K219 Gastro-esophageal reflux disease without esophagitis: Secondary | ICD-10-CM

## 2020-05-08 DIAGNOSIS — E7849 Other hyperlipidemia: Secondary | ICD-10-CM

## 2020-05-08 NOTE — Telephone Encounter (Signed)
Future visit in 3 months  

## 2020-06-04 ENCOUNTER — Other Ambulatory Visit: Payer: Self-pay | Admitting: Family Medicine

## 2020-06-04 DIAGNOSIS — K219 Gastro-esophageal reflux disease without esophagitis: Secondary | ICD-10-CM

## 2020-08-04 ENCOUNTER — Other Ambulatory Visit: Payer: Self-pay | Admitting: *Deleted

## 2020-08-04 DIAGNOSIS — R7309 Other abnormal glucose: Secondary | ICD-10-CM

## 2020-08-04 DIAGNOSIS — E538 Deficiency of other specified B group vitamins: Secondary | ICD-10-CM

## 2020-08-04 DIAGNOSIS — Z0000 Encounter for general adult medical examination without abnormal findings: Secondary | ICD-10-CM

## 2020-08-04 DIAGNOSIS — E782 Mixed hyperlipidemia: Secondary | ICD-10-CM

## 2020-08-04 DIAGNOSIS — I10 Essential (primary) hypertension: Secondary | ICD-10-CM

## 2020-08-04 DIAGNOSIS — E559 Vitamin D deficiency, unspecified: Secondary | ICD-10-CM

## 2020-08-07 ENCOUNTER — Other Ambulatory Visit: Payer: Self-pay

## 2020-08-07 ENCOUNTER — Other Ambulatory Visit: Payer: Medicare Other

## 2020-08-07 DIAGNOSIS — R7309 Other abnormal glucose: Secondary | ICD-10-CM | POA: Diagnosis not present

## 2020-08-07 DIAGNOSIS — E559 Vitamin D deficiency, unspecified: Secondary | ICD-10-CM | POA: Diagnosis not present

## 2020-08-07 DIAGNOSIS — E538 Deficiency of other specified B group vitamins: Secondary | ICD-10-CM | POA: Diagnosis not present

## 2020-08-07 DIAGNOSIS — I10 Essential (primary) hypertension: Secondary | ICD-10-CM | POA: Diagnosis not present

## 2020-08-07 DIAGNOSIS — E782 Mixed hyperlipidemia: Secondary | ICD-10-CM | POA: Diagnosis not present

## 2020-08-08 LAB — COMPLETE METABOLIC PANEL WITH GFR
AG Ratio: 1.1 (calc) (ref 1.0–2.5)
ALT: 28 U/L (ref 6–29)
AST: 42 U/L — ABNORMAL HIGH (ref 10–35)
Albumin: 3.9 g/dL (ref 3.6–5.1)
Alkaline phosphatase (APISO): 246 U/L — ABNORMAL HIGH (ref 37–153)
BUN: 17 mg/dL (ref 7–25)
CO2: 27 mmol/L (ref 20–32)
Calcium: 9.2 mg/dL (ref 8.6–10.4)
Chloride: 106 mmol/L (ref 98–110)
Creat: 0.81 mg/dL (ref 0.60–0.93)
GFR, Est African American: 82 mL/min/{1.73_m2} (ref >=60)
GFR, Est Non African American: 71 mL/min/{1.73_m2} (ref >=60)
Globulin: 3.7 g/dL (calc) (ref 1.9–3.7)
Glucose, Bld: 88 mg/dL (ref 65–99)
Potassium: 4.4 mmol/L (ref 3.5–5.3)
Sodium: 140 mmol/L (ref 135–146)
Total Bilirubin: 0.5 mg/dL (ref 0.2–1.2)
Total Protein: 7.6 g/dL (ref 6.1–8.1)

## 2020-08-08 LAB — HEMOGLOBIN A1C
Hgb A1c MFr Bld: 5.2 % of total Hgb (ref <5.7)
Mean Plasma Glucose: 103 mg/dL
eAG (mmol/L): 5.7 mmol/L

## 2020-08-08 LAB — CBC WITH DIFFERENTIAL/PLATELET
Absolute Monocytes: 467 cells/uL (ref 200–950)
Basophils Absolute: 32 cells/uL (ref 0–200)
Basophils Relative: 0.5 %
Eosinophils Absolute: 198 cells/uL (ref 15–500)
Eosinophils Relative: 3.1 %
HCT: 39.7 % (ref 35.0–45.0)
Hemoglobin: 13 g/dL (ref 11.7–15.5)
Lymphs Abs: 1133 cells/uL (ref 850–3900)
MCH: 29.8 pg (ref 27.0–33.0)
MCHC: 32.7 g/dL (ref 32.0–36.0)
MCV: 91.1 fL (ref 80.0–100.0)
MPV: 13.3 fL — ABNORMAL HIGH (ref 7.5–12.5)
Monocytes Relative: 7.3 %
Neutro Abs: 4570 cells/uL (ref 1500–7800)
Neutrophils Relative %: 71.4 %
Platelets: 184 10*3/uL (ref 140–400)
RBC: 4.36 10*6/uL (ref 3.80–5.10)
RDW: 12.5 % (ref 11.0–15.0)
Total Lymphocyte: 17.7 %
WBC: 6.4 10*3/uL (ref 3.8–10.8)

## 2020-08-08 LAB — LIPID PANEL
Cholesterol: 179 mg/dL (ref <200)
HDL: 67 mg/dL (ref >=50)
LDL Cholesterol (Calc): 95 mg/dL (calc)
Non-HDL Cholesterol (Calc): 112 mg/dL (calc) (ref <130)
Total CHOL/HDL Ratio: 2.7 (calc) (ref <5.0)
Triglycerides: 78 mg/dL (ref <150)

## 2020-08-08 LAB — VITAMIN B12: Vitamin B-12: 537 pg/mL (ref 200–1100)

## 2020-08-08 LAB — TSH: TSH: 1.4 mIU/L (ref 0.40–4.50)

## 2020-08-08 LAB — VITAMIN D 25 HYDROXY (VIT D DEFICIENCY, FRACTURES): Vit D, 25-Hydroxy: 42 ng/mL (ref 30–100)

## 2020-08-14 ENCOUNTER — Ambulatory Visit (INDEPENDENT_AMBULATORY_CARE_PROVIDER_SITE_OTHER): Payer: Medicare Other | Admitting: Family Medicine

## 2020-08-14 ENCOUNTER — Encounter: Payer: Self-pay | Admitting: Family Medicine

## 2020-08-14 ENCOUNTER — Other Ambulatory Visit: Payer: Self-pay

## 2020-08-14 VITALS — BP 150/72 | HR 58 | Ht 64.0 in | Wt 170.2 lb

## 2020-08-14 DIAGNOSIS — E663 Overweight: Secondary | ICD-10-CM | POA: Diagnosis not present

## 2020-08-14 DIAGNOSIS — N3000 Acute cystitis without hematuria: Secondary | ICD-10-CM | POA: Diagnosis not present

## 2020-08-14 DIAGNOSIS — D329 Benign neoplasm of meninges, unspecified: Secondary | ICD-10-CM | POA: Diagnosis not present

## 2020-08-14 DIAGNOSIS — K219 Gastro-esophageal reflux disease without esophagitis: Secondary | ICD-10-CM | POA: Diagnosis not present

## 2020-08-14 DIAGNOSIS — J3089 Other allergic rhinitis: Secondary | ICD-10-CM | POA: Diagnosis not present

## 2020-08-14 DIAGNOSIS — Z0000 Encounter for general adult medical examination without abnormal findings: Secondary | ICD-10-CM

## 2020-08-14 DIAGNOSIS — I10 Essential (primary) hypertension: Secondary | ICD-10-CM

## 2020-08-14 DIAGNOSIS — E7849 Other hyperlipidemia: Secondary | ICD-10-CM

## 2020-08-14 DIAGNOSIS — H6983 Other specified disorders of Eustachian tube, bilateral: Secondary | ICD-10-CM

## 2020-08-14 MED ORDER — FLUTICASONE PROPIONATE 50 MCG/ACT NA SUSP
2.0000 | Freq: Every day | NASAL | 1 refills | Status: DC
Start: 2020-08-14 — End: 2020-11-10

## 2020-08-14 MED ORDER — OMEPRAZOLE 20 MG PO CPDR: 20.0000 mg | DELAYED_RELEASE_CAPSULE | Freq: Every day | ORAL | 3 refills | Status: DC

## 2020-08-14 MED ORDER — LISINOPRIL 40 MG PO TABS: 40.0000 mg | ORAL_TABLET | Freq: Every day | ORAL | 3 refills | Status: DC

## 2020-08-14 MED ORDER — AMOXICILLIN-POT CLAVULANATE 875-125 MG PO TABS
1.0000 | ORAL_TABLET | Freq: Two times a day (BID) | ORAL | 0 refills | Status: DC
Start: 2020-08-14 — End: 2021-02-19

## 2020-08-14 MED ORDER — LOVASTATIN 20 MG PO TABS: 20.0000 mg | ORAL_TABLET | Freq: Every day | ORAL | 3 refills | Status: DC

## 2020-08-14 NOTE — Assessment & Plan Note (Signed)
Controlled cholesterol on statin and improving lifestyle Last lipid panel 07/2020 Calculated ASCVD 10 yr risk score, elevated based on HTN, age  Plan: 1. Continue current meds - Lovastatin 20mg  daily 2. Future consider ASA 81mg  for primary ASCVD risk reduction 3. Encourage improved lifestyle - low carb/cholesterol, reduce portion size, continue improving regular exercise 4. Yearly lipids

## 2020-08-14 NOTE — Assessment & Plan Note (Addendum)
Controlled HTN, mild elevated initial No complications Failed Amlodipine 5mg  intolerance, off HCTZ    Plan:  1. Continue current BP meds - Lisinopril 40mg  daily 2. Lifestyle Mods - continue try to improve regular exercise, low sodium diet 3. Contiue Monitor BP at home

## 2020-08-14 NOTE — Assessment & Plan Note (Signed)
Followed by [facility] Neurosurgery for surveillance

## 2020-08-14 NOTE — Assessment & Plan Note (Signed)
Controlled Refill Omeprazole 

## 2020-08-14 NOTE — Patient Instructions (Addendum)
Thank you for coming to the office today.  Mild elevated BP it did improve however. Keep track of your BP call us if elevated consistently we can add other med if need.  Try Voltaren topical on your foot as needed up to 1-4 times a day for arthritis.  Vitamins are normal. No supplement needed  Sugar and cholesterol are excellent. No other concern on labwork.  Start Augmentin for Urinary Tract Infection  Start nasal steroid Flonase 2 sprays in each nostril daily for 4-6 weeks, may repeat course seasonally or as needed  Please schedule a Follow-up Appointment to: Return in about 6 months (around 02/14/2021) for 6 month follow-up HTN.  If you have any other questions or concerns, please feel free to call the office or send a message through [place]. You may also schedule an earlier appointment if necessary.  Additionally, you may be receiving a survey about your experience at our office within a few days to 1 week by e-mail or mail. We value your feedback.  Nobie Putnam, DO [place]

## 2020-08-14 NOTE — Progress Notes (Signed)
Subjective:    Patient ID: Vanessa Reynolds, female    DOB: [birth year], 75 y.o.   MRN: [id number]  Vanessa Reynolds is a 75 y.o. female presenting on 08/14/2020 for Annual Exam and Hypertension   HPI  CHRONIC HTN History of possible TIA Meningioma  Followed by [facility] Neurosurgery Dr Izora Ribas for Meningioma surveillance.  Followed by Dr Delana Meyer, Vascular they think may have had TIA/CVA but was not identified on imaging. Had Carotid dopplers and imaging.  She monitors BP regularly, not having any issues with it right now. Occasionally elevated BP at times, now her BP was high because high salt intake yesterday.  She is taking Lisinopril 40mg  daily now.  Reports good compliance, took meds today. Tolerating well, w/o complaints. Denies CP, dyspnea, HA, edema / lightheadedness   Lab results B12 was normal. No longer on supplement Vitamin D normal level, on supplement. TSH thyroid was normal.  additionally  UTI - similar to prior uti with dysuria, declines urine test. Interested in similar antibiotic last time. Treated with keflex.  Allergic Rhinosinusitis  - needs re order Flonase.   Health Maintenance: COVID Vaccine J&J done 09/2019, no booster dose.  Depression screen [facility] 2/9 04/17/2020 01/11/2020 08/17/2019  Decreased Interest 0 0 0  Down, Depressed, Hopeless 0 0 0  PHQ - 2 Score 0 0 0    Past Medical History:  Diagnosis Date  . Abdominal pain, left lower quadrant 2012  . Allergy   . Breast screening, unspecified   . H/O cystitis 2011  . History of colon cancer   . Hyperlipidemia   . Hypertension   . Nausea with vomiting   . Personal history of malignant neoplasm of large intestine 2013  . Personal history of tobacco use, presenting hazards to health   . 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  Marland Kitchen Rectal cancer [facility])    Past Surgical History:  Procedure Laterality Date  . APPENDECTOMY  December 2011  . BILATERAL  OOPHORECTOMY  December 2011   Completed at time of low anterior resection.  . COLON SURGERY  03/14/10   lap assisted low anterior resection-adenocarcinoma of the rectum and a villous adenoma  . COLONOSCOPY  2011,2012,2015   Dr. Vira Agar, Dr. Addison Lank)  . COLONOSCOPY WITH PROPOFOL N/A 01/05/2020   Procedure: COLONOSCOPY WITH PROPOFOL;  Surgeon: Robert Bellow, MD;  Location: [facility] ENDOSCOPY;  Service: Endoscopy;  Laterality: N/A;   Social History   Socioeconomic History  . Marital status: Married    Spouse name: Not on file  . Number of children: Not on file  . Years of education: Not on file  . Highest education level: 10th grade  Occupational History    Comment: fulltime   Tobacco Use  . Smoking status: Former Smoker    Packs/day: 2.00    Years: 30.00    Pack years: 60.00    Types: Cigarettes    Quit date: 05/20/1997    Years since quitting: 23.2  . Smokeless tobacco: Former Network engineer  . Vaping Use: Never used  Substance and Sexual Activity  . Alcohol use: No  . Drug use: No  . Sexual activity: Not on file  Other Topics Concern  . Not on file  Social History Narrative  . Not on file   Social Determinants of Health   Financial Resource Strain: Not on file  Food Insecurity: Not on file  Transportation Needs: Not on file  Physical Activity: Not on file  Stress: Not on file  Social Connections: Not on file  Intimate Partner Violence: Not on file   Family History  Problem Relation Age of Onset  . Hypertension Mother        deceased, age 64  . Cancer Father        gastric cancer, deceased, mid 46's  . Breast cancer Neg Hx    Current Outpatient Medications on File Prior to Visit  Medication Sig  . loratadine (CLARITIN) 10 MG tablet Take 1 tablet (10 mg total) by mouth daily. Use for 4-6 weeks then stop, and use as needed or seasonally  . Multiple Vitamins-Minerals (ZINC PO) Take by mouth daily.  . Probiotic Product (PROBIOTIC DAILY PO) Take 1 tablet by  mouth daily.   No current facility-administered medications on file prior to visit.    Review of Systems  Constitutional: Negative for activity change, appetite change, chills, diaphoresis, fatigue and fever.  HENT: Negative for congestion and hearing loss.   Eyes: Negative for visual disturbance.  Respiratory: Negative for apnea, cough, chest tightness, shortness of breath and wheezing.   Cardiovascular: Negative for chest pain, palpitations and leg swelling.  Gastrointestinal: Negative for abdominal pain, anal bleeding, blood in stool, constipation, diarrhea, nausea and vomiting.  Endocrine: Negative for cold intolerance.  Genitourinary: Negative for difficulty urinating, dysuria, frequency and hematuria.  Musculoskeletal: Negative for arthralgias and neck pain.  Skin: Negative for rash.  Allergic/Immunologic: Positive for environmental allergies.  Neurological: Negative for dizziness, weakness, light-headedness, numbness and headaches.  Hematological: Negative for adenopathy.  Psychiatric/Behavioral: Negative for behavioral problems, dysphoric mood and sleep disturbance. The patient is not nervous/anxious.    Per HPI unless specifically indicated above      Objective:    BP (!) 150/72 (BP Location: Left Arm, Cuff Size: Normal)   Pulse (!) 58   Ht 5\' 4"  (1.626 m)   Wt 170 lb 3.2 oz (77.2 kg)   SpO2 100%   BMI 29.21 kg/m   Wt Readings from Last 3 Encounters:  08/14/20 170 lb 3.2 oz (77.2 kg)  04/17/20 174 lb (78.9 kg)  01/10/20 171 lb 9.6 oz (77.8 kg)    Physical Exam Vitals and nursing note reviewed.  Constitutional:      General: She is not in acute distress.    Appearance: She is well-developed. She is not diaphoretic.     Comments: Well-appearing, comfortable, cooperative  HENT:     Head: Normocephalic and atraumatic.  Eyes:     General:        Right eye: No discharge.        Left eye: No discharge.     Conjunctiva/sclera: Conjunctivae normal.     Pupils:  Pupils are equal, round, and reactive to light.  Neck:     Thyroid: No thyromegaly.  Cardiovascular:     Rate and Rhythm: Normal rate and regular rhythm.     Heart sounds: Murmur heard.    Pulmonary:     Effort: Pulmonary effort is normal. No respiratory distress.     Breath sounds: Normal breath sounds. No wheezing or rales.  Abdominal:     General: Bowel sounds are normal. There is no distension.     Palpations: Abdomen is soft. There is no mass.     Tenderness: There is no abdominal tenderness.  Musculoskeletal:        General: No tenderness. Normal range of motion.     Cervical back: Normal range of motion  and neck supple.     Comments: Upper / Lower Extremities: - Normal muscle tone, strength bilateral upper extremities 5/5, lower extremities 5/5  Lymphadenopathy:     Cervical: No cervical adenopathy.  Skin:    General: Skin is warm and dry.     Findings: No erythema or rash.  Neurological:     Mental Status: She is alert and oriented to person, place, and time.     Comments: Distal sensation intact to light touch all extremities  Psychiatric:        Behavior: Behavior normal.     Comments: Well groomed, good eye contact, normal speech and thoughts    Results for orders placed or performed in visit on 08/04/20  VITAMIN D 25 Hydroxy (Vit-D Deficiency, Fractures)  Result Value Ref Range   Vit D, 25-Hydroxy 42 30 - 100 ng/mL  TSH  Result Value Ref Range   TSH 1.40 0.40 - 4.50 mIU/L  Vitamin B12  Result Value Ref Range   Vitamin B-12 537 200 - 1,100 pg/mL  Lipid panel  Result Value Ref Range   Cholesterol 179 <200 mg/dL   HDL 67 > OR = 50 mg/dL   Triglycerides 78 <150 mg/dL   LDL Cholesterol (Calc) 95 mg/dL (calc)   Total CHOL/HDL Ratio 2.7 <5.0 (calc)   Non-HDL Cholesterol (Calc) 112 <130 mg/dL (calc)  COMPLETE METABOLIC PANEL WITH GFR  Result Value Ref Range   Glucose, Bld 88 65 - 99 mg/dL   BUN 17 7 - 25 mg/dL   Creat 0.81 0.60 - 0.93 mg/dL   GFR, Est Non  African American 71 > OR = 60 mL/min/1.51m2   GFR, Est African American 82 > OR = 60 mL/min/1.68m2   BUN/Creatinine Ratio NOT APPLICABLE 6 - 22 (calc)   Sodium 140 135 - 146 mmol/L   Potassium 4.4 3.5 - 5.3 mmol/L   Chloride 106 98 - 110 mmol/L   CO2 27 20 - 32 mmol/L   Calcium 9.2 8.6 - 10.4 mg/dL   Total Protein 7.6 6.1 - 8.1 g/dL   Albumin 3.9 3.6 - 5.1 g/dL   Globulin 3.7 1.9 - 3.7 g/dL (calc)   AG Ratio 1.1 1.0 - 2.5 (calc)   Total Bilirubin 0.5 0.2 - 1.2 mg/dL   Alkaline phosphatase (APISO) 246 (H) 37 - 153 U/L   AST 42 (H) 10 - 35 U/L   ALT 28 6 - 29 U/L  CBC with Differential/Platelet  Result Value Ref Range   WBC 6.4 3.8 - 10.8 Thousand/uL   RBC 4.36 3.80 - 5.10 Million/uL   Hemoglobin 13.0 11.7 - 15.5 g/dL   HCT 39.7 35.0 - 45.0 %   MCV 91.1 80.0 - 100.0 fL   MCH 29.8 27.0 - 33.0 pg   MCHC 32.7 32.0 - 36.0 g/dL   RDW 12.5 11.0 - 15.0 %   Platelets 184 140 - 400 Thousand/uL   MPV 13.3 (H) 7.5 - 12.5 fL   Neutro Abs 4,570 1,500 - 7,800 cells/uL   Lymphs Abs 1,133 850 - 3,900 cells/uL   Absolute Monocytes 467 200 - 950 cells/uL   Eosinophils Absolute 198 15 - 500 cells/uL   Basophils Absolute 32 0 - 200 cells/uL   Neutrophils Relative % 71.4 %   Total Lymphocyte 17.7 %   Monocytes Relative 7.3 %   Eosinophils Relative 3.1 %   Basophils Relative 0.5 %  Hemoglobin A1c  Result Value Ref Range   Hgb A1c MFr Bld 5.2 <5.7 %  of total Hgb   Mean Plasma Glucose 103 mg/dL   eAG (mmol/L) 5.7 mmol/L      Assessment & Plan:   Problem List Items Addressed This Visit    Overweight (BMI 25.0-29.9)   Meningioma ([place])    Followed by [facility] Neurosurgery for surveillance      Hyperlipidemia    Controlled cholesterol on statin and improving lifestyle Last lipid panel 07/2020 Calculated ASCVD 10 yr risk score, elevated based on HTN, age  Plan: 1. Continue current meds - Lovastatin 20mg  daily 2. Future consider ASA 81mg  for primary ASCVD risk reduction 3. Encourage improved  lifestyle - low carb/cholesterol, reduce portion size, continue improving regular exercise 4. Yearly lipids      Relevant Medications   lovastatin (MEVACOR) 20 MG tablet   lisinopril (ZESTRIL) 40 MG tablet   Gastroesophageal reflux disease without esophagitis    Controlled Refill Omeprazole      Relevant Medications   omeprazole (PRILOSEC) 20 MG capsule   Essential hypertension    Controlled HTN, mild elevated initial No complications Failed Amlodipine 5mg  intolerance, off HCTZ    Plan:  1. Continue current BP meds - Lisinopril 40mg  daily 2. Lifestyle Mods - continue try to improve regular exercise, low sodium diet 3. Contiue Monitor BP at home      Relevant Medications   lovastatin (MEVACOR) 20 MG tablet   lisinopril (ZESTRIL) 40 MG tablet   Environmental and seasonal allergies   Relevant Medications   fluticasone (FLONASE) 50 MCG/ACT nasal spray   Allergic rhinitis due to allergen   Relevant Medications   fluticasone (FLONASE) 50 MCG/ACT nasal spray    Other Visit Diagnoses    Annual physical exam    -  Primary   Dysfunction of both eustachian tubes       Relevant Medications   fluticasone (FLONASE) 50 MCG/ACT nasal spray   Acute cystitis without hematuria       Relevant Medications   amoxicillin-clavulanate (AUGMENTIN) 875-125 MG tablet      Updated Health Maintenance information COVID Booster dose J&J vaccine when ready Reviewed recent lab results with patient Encouraged improvement to lifestyle with diet and exercise Goal of weight loss  Allergic Rhinosinusitis Start nasal steroid Flonase 2 sprays in each nostril daily for 4-6 weeks, may repeat course seasonally or as needed  Foot arthritis Right foot arch - advised to use topical Voltaren PRN.  Acute UTI Will cover with Augmentin, can possibly cover sinusitis as well if need.  Meds ordered this encounter  Medications  . lovastatin (MEVACOR) 20 MG tablet    Sig: Take 1 tablet (20 mg total) by mouth  at bedtime.    Dispense:  90 tablet    Refill:  3  . fluticasone (FLONASE) 50 MCG/ACT nasal spray    Sig: Place 2 sprays into both nostrils daily. Use for 4-6 weeks then stop and use seasonally or as needed.    Dispense:  48 g    Refill:  1  . amoxicillin-clavulanate (AUGMENTIN) 875-125 MG tablet    Sig: Take 1 tablet by mouth 2 (two) times daily. For 7 days    Dispense:  14 tablet    Refill:  0  . lisinopril (ZESTRIL) 40 MG tablet    Sig: Take 1 tablet (40 mg total) by mouth at bedtime.    Dispense:  90 tablet    Refill:  3    Keep refill on file.  Marland Kitchen omeprazole (PRILOSEC) 20 MG capsule    Sig:  Take 1 capsule (20 mg total) by mouth daily before breakfast.    Dispense:  90 capsule    Refill:  3      Follow up plan: Return in about 6 months (around 02/14/2021) for 6 month follow-up HTN.  Nobie Putnam, [facility] 08/14/2020, 9:02 AM

## 2020-11-10 ENCOUNTER — Other Ambulatory Visit: Payer: Self-pay | Admitting: Family Medicine

## 2020-11-10 DIAGNOSIS — H6983 Other specified disorders of Eustachian tube, bilateral: Secondary | ICD-10-CM

## 2020-11-10 DIAGNOSIS — J3089 Other allergic rhinitis: Secondary | ICD-10-CM

## 2020-11-14 DIAGNOSIS — M2011 Hallux valgus (acquired), right foot: Secondary | ICD-10-CM | POA: Diagnosis not present

## 2020-11-14 DIAGNOSIS — M79671 Pain in right foot: Secondary | ICD-10-CM | POA: Diagnosis not present

## 2020-11-14 DIAGNOSIS — M19071 Primary osteoarthritis, right ankle and foot: Secondary | ICD-10-CM | POA: Diagnosis not present

## 2020-11-16 ENCOUNTER — Telehealth: Payer: Self-pay | Admitting: Family Medicine

## 2020-11-16 NOTE — Telephone Encounter (Signed)
Copied from [place] [id number]. Topic: Medicare AWV >> Nov 16, 2020 11:19 AM Cher Nakai R wrote: Reason for CRM:   No answer unable to leave a message for patient to call back and schedule the Medicare Annual Wellness Visit (AWV) virtually or by telephone.  Last AWV 07/14/2018  Please schedule at anytime with [facility].  40 minute appointment  Any questions, please call me at [phone]

## 2021-02-16 ENCOUNTER — Other Ambulatory Visit: Payer: Self-pay | Admitting: Neurosurgery

## 2021-02-16 ENCOUNTER — Other Ambulatory Visit (HOSPITAL_COMMUNITY): Payer: Self-pay | Admitting: Neurosurgery

## 2021-02-16 DIAGNOSIS — D329 Benign neoplasm of meninges, unspecified: Secondary | ICD-10-CM

## 2021-02-19 ENCOUNTER — Ambulatory Visit (INDEPENDENT_AMBULATORY_CARE_PROVIDER_SITE_OTHER): Payer: Medicare Other | Admitting: Family Medicine

## 2021-02-19 ENCOUNTER — Other Ambulatory Visit: Payer: Self-pay | Admitting: Family Medicine

## 2021-02-19 ENCOUNTER — Encounter: Payer: Self-pay | Admitting: Family Medicine

## 2021-02-19 ENCOUNTER — Other Ambulatory Visit: Payer: Self-pay

## 2021-02-19 VITALS — BP 148/78 | HR 60 | Ht 64.0 in | Wt 169.8 lb

## 2021-02-19 DIAGNOSIS — D329 Benign neoplasm of meninges, unspecified: Secondary | ICD-10-CM | POA: Diagnosis not present

## 2021-02-19 DIAGNOSIS — E782 Mixed hyperlipidemia: Secondary | ICD-10-CM | POA: Diagnosis not present

## 2021-02-19 DIAGNOSIS — I10 Essential (primary) hypertension: Secondary | ICD-10-CM

## 2021-02-19 DIAGNOSIS — I700 Atherosclerosis of aorta: Secondary | ICD-10-CM | POA: Diagnosis not present

## 2021-02-19 DIAGNOSIS — Z23 Encounter for immunization: Secondary | ICD-10-CM | POA: Diagnosis not present

## 2021-02-19 DIAGNOSIS — Z8679 Personal history of other diseases of the circulatory system: Secondary | ICD-10-CM

## 2021-02-19 DIAGNOSIS — E538 Deficiency of other specified B group vitamins: Secondary | ICD-10-CM

## 2021-02-19 DIAGNOSIS — Z0000 Encounter for general adult medical examination without abnormal findings: Secondary | ICD-10-CM

## 2021-02-19 DIAGNOSIS — K219 Gastro-esophageal reflux disease without esophagitis: Secondary | ICD-10-CM

## 2021-02-19 DIAGNOSIS — E559 Vitamin D deficiency, unspecified: Secondary | ICD-10-CM

## 2021-02-19 NOTE — Assessment & Plan Note (Signed)
Followed by [facility] for surveillance MRI upcoming

## 2021-02-19 NOTE — Assessment & Plan Note (Signed)
Controlled HTN, mild elevated initial repeat improved No complications Failed Amlodipine 5mg  intolerance, off HCTZ    Plan:  1. Continue current BP meds - Lisinopril 40mg  daily 2. Lifestyle Mods - continue try to improve regular exercise, low sodium diet 3. Contiue Monitor BP at home - goal to keep log and contact us sooner if persistent elevated BP >140

## 2021-02-19 NOTE — Progress Notes (Addendum)
Subjective:    Patient ID: Vanessa Reynolds, female    DOB: 08-17-1945, 75 y.o.   MRN: [id number]  Vanessa Reynolds is a 75 y.o. female presenting on 02/19/2021 for Hypertension   HPI  CHRONIC HTN Hyperlipidemia History of possible TIA Meningioma  Followed by [facility] Neurosurgery Dr Izora Ribas for Meningioma surveillance. Upcoming MRI    Followed by Dr Delana Meyer, Vascular  Elevated BP now recently. Home Bp not checking as often but she can monitor it more. Admits salt and stress can raise BP  She is taking Lisinopril 40mg  daily.   Reports good compliance, took meds today. Tolerating well, w/o complaints. Denies CP, dyspnea, HA, edema / lightheadedness    Atherosclerosis Aorta On imaging CT 2021, see below On statin therapy   Health Maintenance: COVID Vaccine J&J done 09/2019, no booster dose. She declines booster  Due for Flu Shot, will receive today    Depression screen [facility] 2/9 04/17/2020 01/11/2020 08/17/2019  Decreased Interest 0 0 0  Down, Depressed, Hopeless 0 0 0  PHQ - 2 Score 0 0 0    Social History   Tobacco Use   Smoking status: Former    Packs/day: 2.00    Years: 30.00    Pack years: 60.00    Types: Cigarettes    Quit date: 05/20/1997    Years since quitting: 23.7   Smokeless tobacco: Former  Scientific laboratory technician Use: Never used  Substance Use Topics   Alcohol use: No   Drug use: No    Review of Systems Per HPI unless specifically indicated above     Objective:    BP (!) 148/78 (BP Location: Left Arm, Cuff Size: Normal)   Pulse 60   Ht 5\' 4"  (1.626 m)   Wt 169 lb 12.8 oz (77 kg)   SpO2 98%   BMI 29.15 kg/m   Wt Readings from Last 3 Encounters:  02/19/21 169 lb 12.8 oz (77 kg)  08/14/20 170 lb 3.2 oz (77.2 kg)  04/17/20 174 lb (78.9 kg)    Physical Exam Vitals and nursing note reviewed.  Constitutional:      General: She is not in acute distress.    Appearance: She is well-developed. She is not diaphoretic.     Comments: Well-appearing,  comfortable, cooperative  HENT:     Head: Normocephalic and atraumatic.  Eyes:     General:        Right eye: No discharge.        Left eye: No discharge.     Conjunctiva/sclera: Conjunctivae normal.  Neck:     Thyroid: No thyromegaly.  Cardiovascular:     Rate and Rhythm: Normal rate and regular rhythm.     Heart sounds: Normal heart sounds. No murmur heard. Pulmonary:     Effort: Pulmonary effort is normal. No respiratory distress.     Breath sounds: Normal breath sounds. No wheezing or rales.  Musculoskeletal:        General: Normal range of motion.     Cervical back: Normal range of motion and neck supple.  Lymphadenopathy:     Cervical: No cervical adenopathy.  Skin:    General: Skin is warm and dry.     Findings: No erythema or rash.  Neurological:     Mental Status: She is alert and oriented to person, place, and time.  Psychiatric:        Behavior: Behavior normal.     Comments: Well groomed, good eye contact, normal  speech and thoughts   CLINICAL DATA:  Acute right lower quadrant abdominal pain.   EXAM: CT ABDOMEN AND PELVIS WITH CONTRAST   TECHNIQUE: Multidetector CT imaging of the abdomen and pelvis was performed using the standard protocol following bolus administration of intravenous contrast.   CONTRAST:  128mL OMNIPAQUE IOHEXOL 300 MG/ML  SOLN   COMPARISON:  April 03, 2010.   FINDINGS: Lower chest: No acute abnormality.   Hepatobiliary: No gallstones or biliary dilatation is noted. Hepatic cysts are noted.   Pancreas: Unremarkable. No pancreatic ductal dilatation or surrounding inflammatory changes.   Spleen: Normal in size without focal abnormality.   Adrenals/Urinary Tract: Adrenal glands appear normal. Small nonobstructive calculus is seen in the lower pole collecting system of the right kidney. Left renal cyst is noted. No definite hydronephrosis or renal obstruction is noted. Urinary bladder is unremarkable.   Stomach/Bowel: Status  post appendectomy. The stomach appears normal. Postsurgical changes are seen involving the rectum. No definite evidence of bowel obstruction or inflammation.   Vascular/Lymphatic: Aortic atherosclerosis. No enlarged abdominal or pelvic lymph nodes.   Reproductive: Uterus and bilateral adnexa are unremarkable.   Other: 4.5 x 3.2 cm low density is noted in the presacral area adjacent to the surgical anastomotic site of the rectum, which demonstrates peripheral calcifications and may represent postoperative seroma.   Musculoskeletal: No acute or significant osseous findings.   IMPRESSION: 1. Small nonobstructive right renal calculus. No definite hydronephrosis or renal obstruction is noted. 2. 4.5 x 3.2 cm low density is noted in the presacral area adjacent to the surgical anastomotic site of the rectum, which demonstrates peripheral calcifications and may represent postoperative seroma.   Aortic Atherosclerosis (ICD10-I70.0).     Electronically Signed   By: Marijo Conception M.D.   On: 10/11/2019 20:31   Results for orders placed or performed in visit on 08/04/20  VITAMIN D 25 Hydroxy (Vit-D Deficiency, Fractures)  Result Value Ref Range   Vit D, 25-Hydroxy 42 30 - 100 ng/mL  TSH  Result Value Ref Range   TSH 1.40 0.40 - 4.50 mIU/L  Vitamin B12  Result Value Ref Range   Vitamin B-12 537 200 - 1,100 pg/mL  Lipid panel  Result Value Ref Range   Cholesterol 179 <200 mg/dL   HDL 67 > OR = 50 mg/dL   Triglycerides 78 <150 mg/dL   LDL Cholesterol (Calc) 95 mg/dL (calc)   Total CHOL/HDL Ratio 2.7 <5.0 (calc)   Non-HDL Cholesterol (Calc) 112 <130 mg/dL (calc)  COMPLETE METABOLIC PANEL WITH GFR  Result Value Ref Range   Glucose, Bld 88 65 - 99 mg/dL   BUN 17 7 - 25 mg/dL   Creat 0.81 0.60 - 0.93 mg/dL   GFR, Est Non African American 71 > OR = 60 mL/min/1.33m2   GFR, Est African American 82 > OR = 60 mL/min/1.81m2   BUN/Creatinine Ratio NOT APPLICABLE 6 - 22 (calc)   Sodium  140 135 - 146 mmol/L   Potassium 4.4 3.5 - 5.3 mmol/L   Chloride 106 98 - 110 mmol/L   CO2 27 20 - 32 mmol/L   Calcium 9.2 8.6 - 10.4 mg/dL   Total Protein 7.6 6.1 - 8.1 g/dL   Albumin 3.9 3.6 - 5.1 g/dL   Globulin 3.7 1.9 - 3.7 g/dL (calc)   AG Ratio 1.1 1.0 - 2.5 (calc)   Total Bilirubin 0.5 0.2 - 1.2 mg/dL   Alkaline phosphatase (APISO) 246 (H) 37 - 153 U/L   AST 42 (  H) 10 - 35 U/L   ALT 28 6 - 29 U/L  CBC with Differential/Platelet  Result Value Ref Range   WBC 6.4 3.8 - 10.8 Thousand/uL   RBC 4.36 3.80 - 5.10 Million/uL   Hemoglobin 13.0 11.7 - 15.5 g/dL   HCT 39.7 35.0 - 45.0 %   MCV 91.1 80.0 - 100.0 fL   MCH 29.8 27.0 - 33.0 pg   MCHC 32.7 32.0 - 36.0 g/dL   RDW 12.5 11.0 - 15.0 %   Platelets 184 140 - 400 Thousand/uL   MPV 13.3 (H) 7.5 - 12.5 fL   Neutro Abs 4,570 1,500 - 7,800 cells/uL   Lymphs Abs 1,133 850 - 3,900 cells/uL   Absolute Monocytes 467 200 - 950 cells/uL   Eosinophils Absolute 198 15 - 500 cells/uL   Basophils Absolute 32 0 - 200 cells/uL   Neutrophils Relative % 71.4 %   Total Lymphocyte 17.7 %   Monocytes Relative 7.3 %   Eosinophils Relative 3.1 %   Basophils Relative 0.5 %  Hemoglobin A1c  Result Value Ref Range   Hgb A1c MFr Bld 5.2 <5.7 % of total Hgb   Mean Plasma Glucose 103 mg/dL   eAG (mmol/L) 5.7 mmol/L      Assessment & Plan:   Problem List Items Addressed This Visit     Meningioma ([place])    Followed by [facility] for surveillance MRI upcoming      Hyperlipidemia    Controlled cholesterol on statin and improving lifestyle Last lipid panel 07/2020 Calculated ASCVD 10 yr risk score, elevated based on HTN, age  Plan: 1. Continue current meds - Lovastatin 20mg  daily 2. Future consider ASA 81mg  for primary ASCVD risk reduction 3. Encourage improved lifestyle - low carb/cholesterol, reduce portion size, continue improving regular exercise      Essential hypertension - Primary    Controlled HTN, mild elevated initial repeat  improved No complications Failed Amlodipine 5mg  intolerance, off HCTZ    Plan:  1. Continue current BP meds - Lisinopril 40mg  daily 2. Lifestyle Mods - continue try to improve regular exercise, low sodium diet 3. Contiue Monitor BP at home - goal to keep log and contact us sooner if persistent elevated BP >140      Other Visit Diagnoses     Needs flu shot       Relevant Orders   Flu Vaccine QUAD High Dose(Fluad) (Completed)   Atherosclerosis of aorta (HCC)         Stable without complication Seen on CT imaging 2021, see copy above On Statin therapy   No orders of the defined types were placed in this encounter.     Follow up plan: Return in about 6 months (around 08/20/2021) for 6 month fasting lab only then 1 week later Annual Physical.  Future labs - 6 month fasting lab only then 1 week later Annual Physical   Nobie Putnam, [facility] 02/19/2021, 10:51 AM

## 2021-02-19 NOTE — Assessment & Plan Note (Signed)
Controlled cholesterol on statin and improving lifestyle Last lipid panel 07/2020 Calculated ASCVD 10 yr risk score, elevated based on HTN, age  Plan: 1. Continue current meds - Lovastatin 20mg  daily 2. Future consider ASA 81mg  for primary ASCVD risk reduction 3. Encourage improved lifestyle - low carb/cholesterol, reduce portion size, continue improving regular exercise

## 2021-02-19 NOTE — Patient Instructions (Addendum)
Thank you for coming to the office today.  Flu Shot today.  Elevated BP today. Repeat improved.  Goal to keep track of BP more regularly 1-2 times a week at home. If persistently >140/90 then call office and we can add a new BP med.  DUE for FASTING BLOOD WORK (no food or drink after midnight before the lab appointment, only water or coffee without cream/sugar on the morning of)  SCHEDULE "Lab Only" visit in the morning at the clinic for lab draw in 6 MONTHS   - Make sure Lab Only appointment is at about 1 week before your next appointment, so that results will be available  For Lab Results, once available within 2-3 days of blood draw, you can can log in to MyChart online to view your results and a brief explanation. Also, we can discuss results at next follow-up visit.    Please schedule a Follow-up Appointment to: Return in about 6 months (around 08/20/2021) for 6 month fasting lab only then 1 week later Annual Physical.  If you have any other questions or concerns, please feel free to call the office or send a message through [place]. You may also schedule an earlier appointment if necessary.  Additionally, you may be receiving a survey about your experience at our office within a few days to 1 week by e-mail or mail. We value your feedback.  Nobie Putnam, DO [place]

## 2021-03-08 ENCOUNTER — Encounter: Payer: Self-pay | Admitting: General Surgery

## 2021-03-20 ENCOUNTER — Ambulatory Visit
Admission: RE | Admit: 2021-03-20 | Discharge: 2021-03-20 | Disposition: A | Discharge status: Home / Self Care | Payer: Medicare Other | Source: Ambulatory Visit | Attending: Neurosurgery | Admitting: Neurosurgery

## 2021-03-20 DIAGNOSIS — D329 Benign neoplasm of meninges, unspecified: Secondary | ICD-10-CM | POA: Insufficient documentation

## 2021-03-20 DIAGNOSIS — J341 Cyst and mucocele of nose and nasal sinus: Secondary | ICD-10-CM | POA: Diagnosis not present

## 2021-03-20 DIAGNOSIS — R220 Localized swelling, mass and lump, head: Secondary | ICD-10-CM | POA: Diagnosis not present

## 2021-03-20 IMAGING — MR MR HEAD WO/W CM
16 series · 48 of 48 positions shown · IV contrast (gadavist)
Comparison: [DATE]

CLINICAL DATA: History of meningioma.  Yearly follow-up

EXAM:
MRI HEAD WITHOUT AND WITH CONTRAST
TECHNIQUE: Multiplanar, multiecho pulse sequences of the brain and surrounding
structures were obtained without and with intravenous contrast.
CONTRAST:  7.5mL GADAVIST GADOBUTROL 1 MMOL/ML IV SOLN

[Series 5: ax dwi_tracew · axial · 3.0mm · 0.65mm/px · z∈[-118,+36]mm · 4 of 48 slices shown]
[im 1/48]
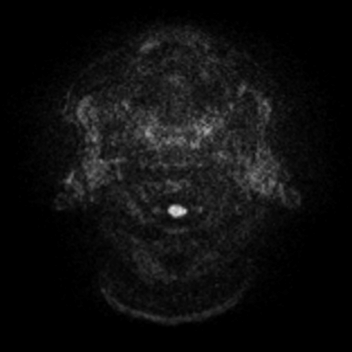
[im 16/48]
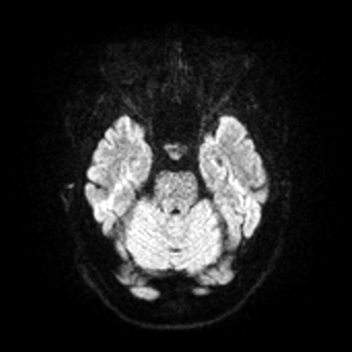
[im 32/48]
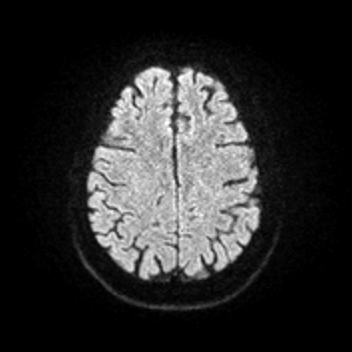
[im 48/48]
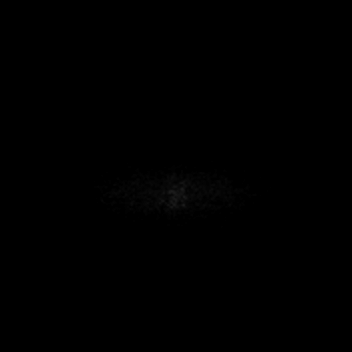

[Series 6: ax dwi_adc · axial · 3.0mm · 0.65mm/px · z∈[-118,+33]mm · 2 of 47 slices shown]
[im 1/47]
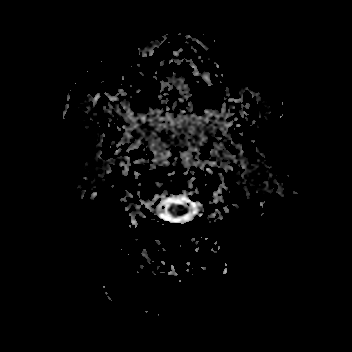
[im 47/47]
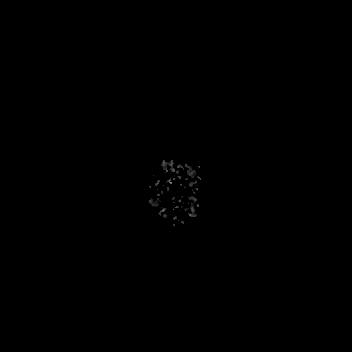

[Series 7: cor dwi_tracew · coronal · 5.0mm · 0.65mm/px · 2 of 40 slices shown]
[im 1/40]
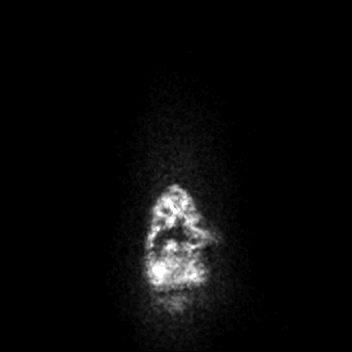
[im 40/40]
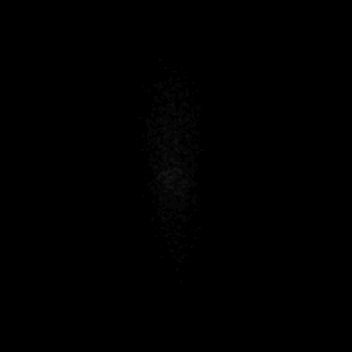

[Series 8: cor dwi_adc · coronal · 5.0mm · 0.65mm/px · 2 of 38 slices shown]
[im 1/38]
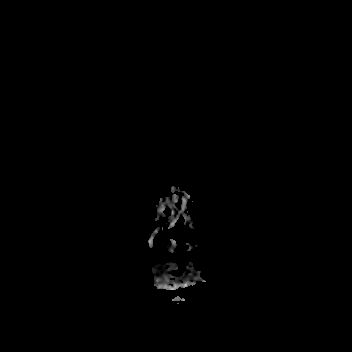
[im 38/38]
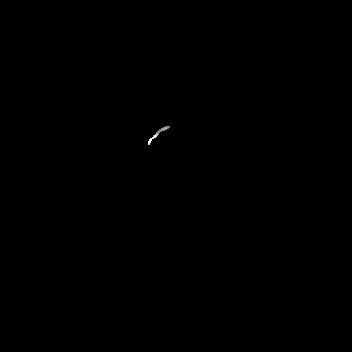

[Series 9: T1 · sagittal · 5.0mm · 0.62mm/px · 1 of 24 slices shown (1 of 2)]
[im 1/24]
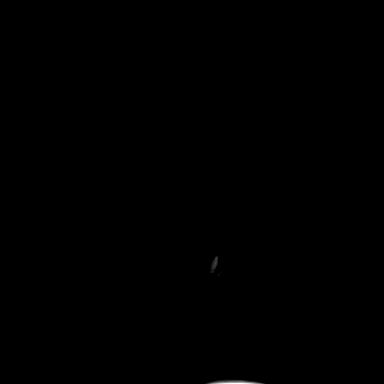

[Series 10: T2 · axial · 5.0mm · 0.53mm/px · 1 of 25 slices shown (1 of 2)]
[im 1/25]
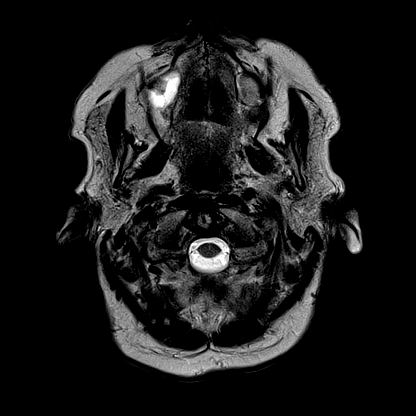

[Series 11: mag_images · axial · 3.0mm · 0.90mm/px · z∈[-127,+49]mm · 3 of 60 slices shown]
[im 1/60]
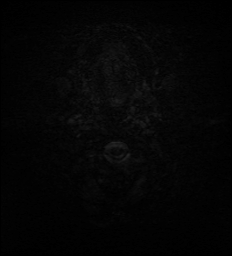
[im 30/60]
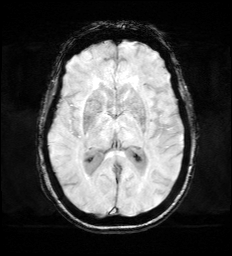
[im 60/60]
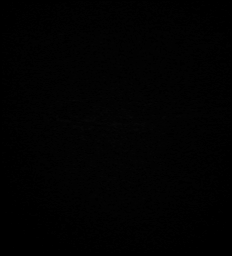

[Series 12: pha_images · axial · 3.0mm · 0.90mm/px · z∈[-127,+49]mm · 3 of 59 slices shown]
[im 1/59]
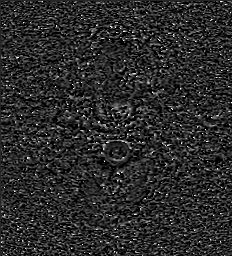
[im 30/59]
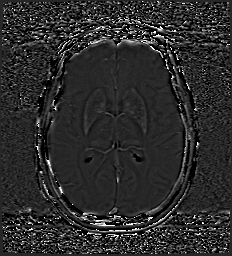
[im 59/59]
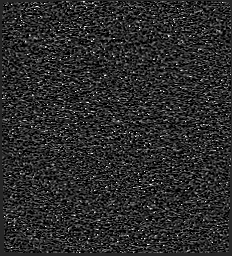

[Series 13: swi_images · axial · 3.0mm · 0.90mm/px · z∈[-127,+49]mm · 3 of 60 slices shown]
[im 1/60]
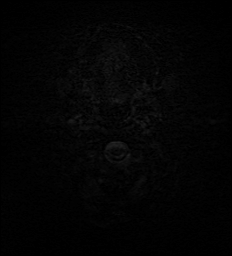
[im 30/60]
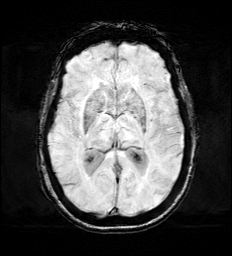
[im 60/60]
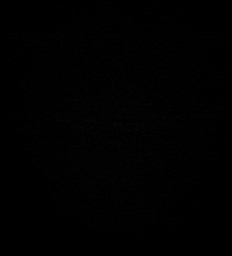

[Series 14: mip_images(sw) · axial · 24.0mm · 0.90mm/px · z∈[-116,+39]mm · 3 of 53 slices shown]
[im 1/53]
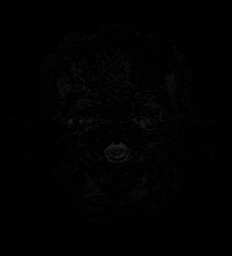
[im 27/53]
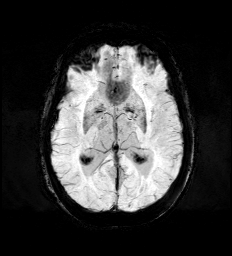
[im 53/53]
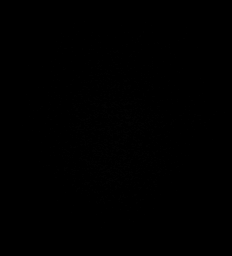

[Series 15: FLAIR · axial · 3.0mm · 0.53mm/px · z∈[-120,+41]mm · 3 of 55 slices shown]
[im 1/55]
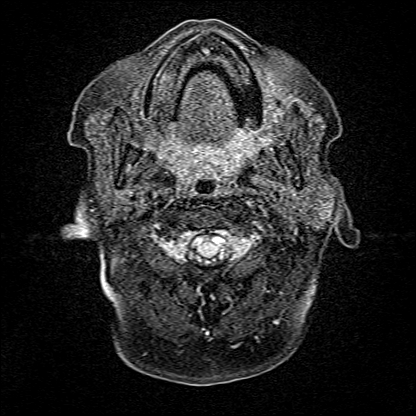
[im 28/55]
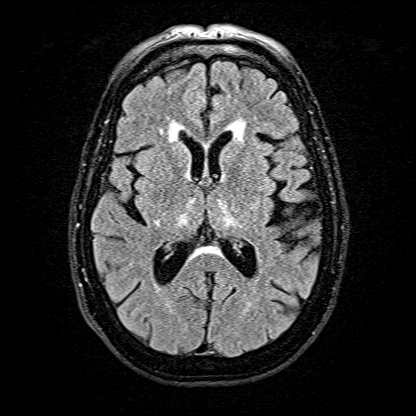
[im 55/55]
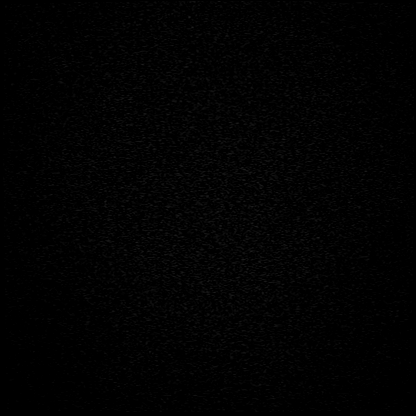

[Series 16: T1 · axial · 1.0mm · 0.98mm/px · z∈[-122,+37]mm · 8 of 160 slices shown (2 of 2)]
[im 1/160]
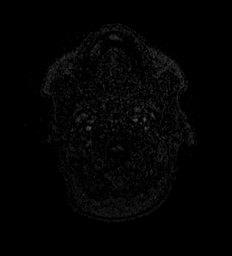
[im 23/160]
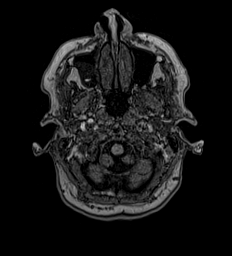
[im 46/160]
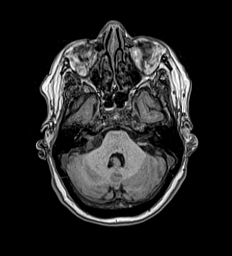
[im 69/160]
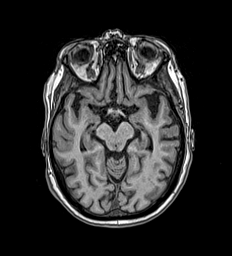
[im 91/160]
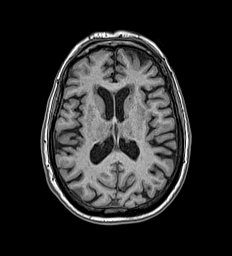
[im 114/160]
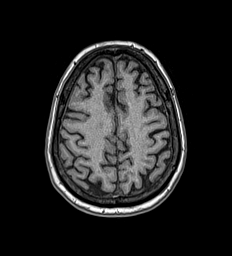
[im 137/160]
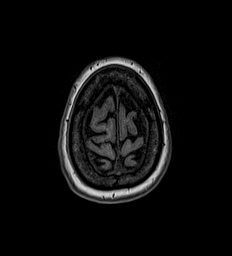
[im 160/160]
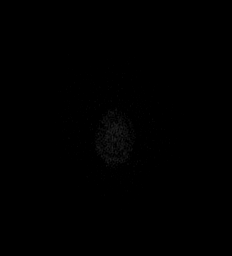

[Series 18: T2 · coronal · 5.0mm · 0.57mm/px · 2 of 29 slices shown (2 of 2)]
[im 1/29]
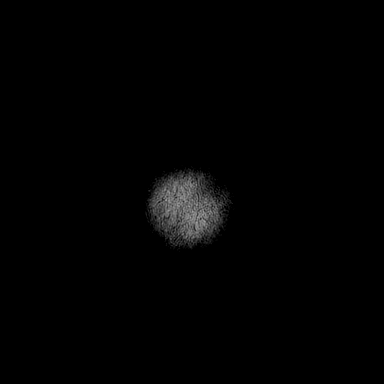
[im 29/29]
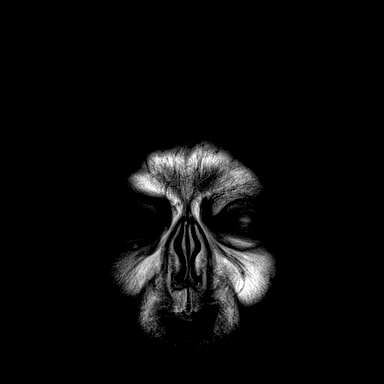

[Series 19: T1 post-contrast · axial · 1.0mm · 0.98mm/px · z∈[-119,+39]mm · 8 of 160 slices shown (1 of 3)]
[im 1/160]
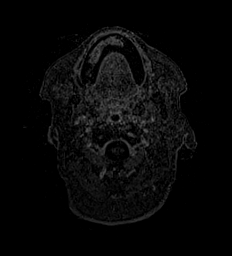
[im 23/160]
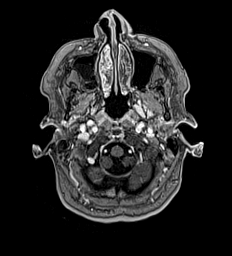
[im 46/160]
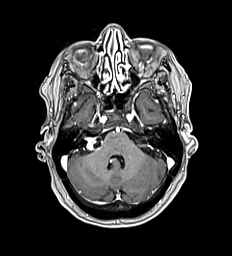
[im 69/160]
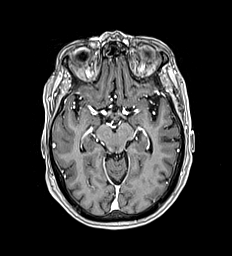
[im 91/160]
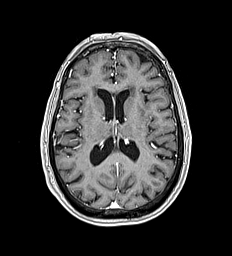
[im 114/160]
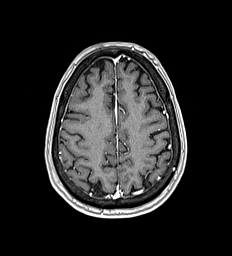
[im 137/160]
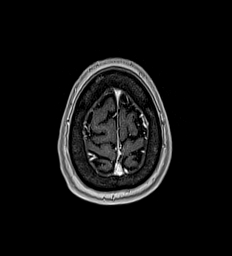
[im 160/160]
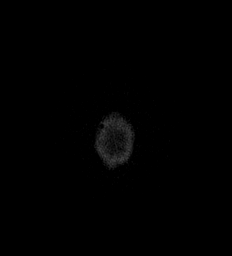

[Series 20: T1 post-contrast · coronal · 5.0mm · 0.57mm/px · 2 of 29 slices shown (2 of 3)]
[im 1/29]
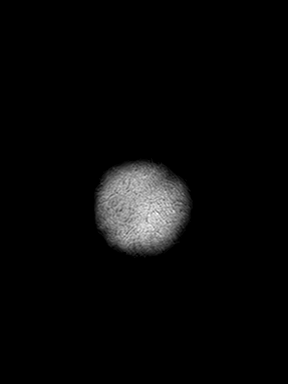
[im 29/29]
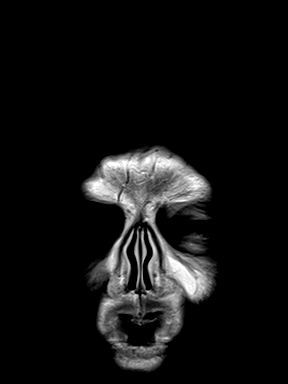

[Series 21: T1 post-contrast · sagittal · 5.0mm · 0.62mm/px · 1 of 23 slices shown (3 of 3)]
[im 1/23]
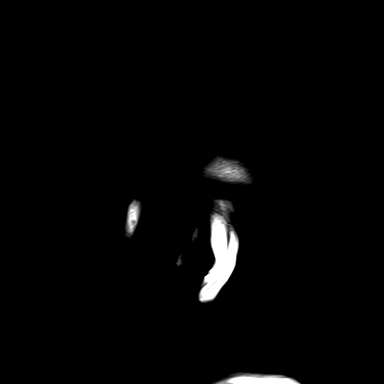

[48 of 48 positions shown; findings below may reference images not displayed]

FINDINGS: Brain: Canalicular and cisternal mass the right internal auditory
canal and CP angle cistern with unchanged size and cisternal cystic
change. The cisternal component measures approximately 16 x 17 mm,
stable when remeasured in a similar fashion.

Right frontal parafalcine meningioma measuring up to 8 x 15 mm,
unchanged when remeasured in a similar fashion and not causing any
significant mass effect frontal lobe.

Mild chronic small vessel ischemia in the hemispheric white matter
and thalami.

Vascular: Preserved flow voids and vascular enhancements

Skull and upper cervical spine: Normal marrow signal

Sinuses/Orbits: Retention cysts in the bilateral maxillary sinus.
Bilateral cataract resection.
IMPRESSION: Unchanged right vestibular schwannoma and right parafalcine
meningioma. No new or progressive finding from [SX].

## 2021-03-20 MED ORDER — GADOBUTROL 1 MMOL/ML IV SOLN
7.5000 mL | Freq: Once | INTRAVENOUS | Status: AC | PRN
  Administered 2021-03-20: 7.5 mL via INTRAVENOUS

## 2021-03-29 DIAGNOSIS — D329 Benign neoplasm of meninges, unspecified: Secondary | ICD-10-CM | POA: Diagnosis not present

## 2021-05-26 ENCOUNTER — Other Ambulatory Visit: Payer: Self-pay | Admitting: Family Medicine

## 2021-05-26 DIAGNOSIS — I10 Essential (primary) hypertension: Secondary | ICD-10-CM

## 2021-05-26 NOTE — Telephone Encounter (Signed)
last RF 08/14/20 #90 3 RF  should have enough to last until March   Requested Prescriptions  Refused Prescriptions Disp Refills   lisinopril (ZESTRIL) 40 MG tablet [Pharmacy Med Name: LISINOPRIL 40 MG TAB] 90 tablet 3    Sig: TAKE 1 TABLET BY MOUTH AT BEDTIME     Cardiovascular:  ACE Inhibitors Failed - 05/26/2021 11:58 AM      Failed - Cr in normal range and within 180 days    Creat  Date Value Ref Range Status  08/07/2020 0.81 0.60 - 0.93 mg/dL Final    Comment:    For patients >75 years of age, the reference limit for Creatinine is approximately 13% higher for people identified as African-American. .          Failed - K in normal range and within 180 days    Potassium  Date Value Ref Range Status  08/07/2020 4.4 3.5 - 5.3 mmol/L Final         Failed - Last BP in normal range    BP Readings from Last 1 Encounters:  02/19/21 (!) 148/78         Passed - Patient is not pregnant      Passed - Valid encounter within last 6 months    Recent Outpatient Visits          3 months ago Essential hypertension   [facility] Olin Hauser, DO   9 months ago Annual physical exam   [place], Devonne Doughty, DO   1 year ago Essential hypertension   [place], Devonne Doughty, DO   1 year ago Essential hypertension   [facility] Olin Hauser, DO   1 year ago Essential hypertension   [place], DO      Future Appointments            In 3 months Parks Ranger, Devonne Doughty, [facility], [facility]

## 2021-05-29 ENCOUNTER — Other Ambulatory Visit: Payer: Self-pay

## 2021-05-29 ENCOUNTER — Ambulatory Visit (INDEPENDENT_AMBULATORY_CARE_PROVIDER_SITE_OTHER): Payer: Medicare Other | Admitting: Family Medicine

## 2021-05-29 ENCOUNTER — Encounter: Payer: Self-pay | Admitting: Family Medicine

## 2021-05-29 VITALS — Ht 64.0 in | Wt 165.0 lb

## 2021-05-29 DIAGNOSIS — J069 Acute upper respiratory infection, unspecified: Secondary | ICD-10-CM | POA: Diagnosis not present

## 2021-05-29 DIAGNOSIS — R6889 Other general symptoms and signs: Secondary | ICD-10-CM | POA: Diagnosis not present

## 2021-05-29 MED ORDER — IPRATROPIUM BROMIDE 0.06 % NA SOLN: 2.0000 | Freq: Four times a day (QID) | NASAL | 0 refills | Status: DC

## 2021-05-29 MED ORDER — BENZONATATE 100 MG PO CAPS: 100.0000 mg | ORAL_CAPSULE | Freq: Three times a day (TID) | ORAL | 0 refills | Status: DC | PRN

## 2021-05-29 NOTE — Progress Notes (Signed)
Virtual Visit via Telephone The purpose of this virtual visit is to provide medical care while limiting exposure to the novel coronavirus (COVID19) for both patient and office staff.  Consent was obtained for phone visit:  Yes.   Answered questions that patient had about telehealth interaction:  Yes.   I discussed the limitations, risks, security and privacy concerns of performing an evaluation and management service by telephone. I also discussed with the patient that there may be a patient responsible charge related to this service. The patient expressed understanding and agreed to proceed.  Patient Location: Home Provider Location: [facility] (Office)  Participants in virtual visit: - Patient: Vanessa Reynolds - CMA: Orinda Kenner, CMA - Provider: Dr Parks Ranger  ---------------------------------------------------------------------- Chief Complaint  Patient presents with   Cough   Chills    S: Reviewed CMA documentation. I have called patient and gathered additional HPI as follows:  Flu like illness Nausea vomiting  Reports that symptoms started about 4 days, initially with nausea vomiting diarrhea, then had worsening now with coughing and runny nose, admits body aches as well. - Husband [name] tested for COVID x 3 times negative. He has same symptoms - Tried OTC cough medicine, tylenol  Denies any known or suspected exposure to person with or possibly with COVID19.  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   Rectal cancer ([place])    Social History   Tobacco Use   Smoking status: Former    Packs/day: 2.00    Years: 30.00    Pack years: 60.00    Types: Cigarettes    Quit date: 05/20/1997    Years since quitting: 24.0   Smokeless tobacco: Former  Scientific laboratory technician Use: Never used  Substance Use Topics   Alcohol use: No   Drug use: No    Current Outpatient Medications:    benzonatate (TESSALON) 100 MG capsule, Take 1 capsule (100 mg total) by mouth 3 (three) times daily as needed for cough., Disp: 30 capsule, Rfl: 0   fluticasone (FLONASE) 50 MCG/ACT nasal spray, PLACE 2 SPRAYS INTO BOTH NOSTRILS DAILY.USE FOR 4-6 WEEKS THEN STOP AND USE SEASONALLY OR AS NEEDED, Disp: 48 g, Rfl: 1   ipratropium (ATROVENT) 0.06 % nasal spray, Place 2 sprays into both nostrils 4 (four) times daily. For up to 5-7 days then stop., Disp: 15 mL, Rfl: 0   lisinopril (ZESTRIL) 40 MG tablet, Take 1 tablet (40 mg total) by mouth at bedtime., Disp: 90 tablet, Rfl: 3   loratadine (CLARITIN) 10 MG tablet, Take 1 tablet (10 mg total) by mouth daily. Use for 4-6 weeks then stop, and use as needed or seasonally, Disp: 90 tablet, Rfl: 1   lovastatin (MEVACOR) 20 MG tablet, Take 1 tablet (20 mg total) by mouth at bedtime., Disp: 90 tablet, Rfl: 3   Multiple  Vitamins-Minerals (ZINC PO), Take by mouth daily., Disp: , Rfl:    omeprazole (PRILOSEC) 20 MG capsule, Take 1 capsule (20 mg total) by mouth daily before breakfast., Disp: 90 capsule, Rfl: 3   Probiotic Product (PROBIOTIC DAILY PO), Take 1 tablet by mouth daily., Disp: , Rfl:   Depression screen [facility] 2/9 04/17/2020 01/11/2020 08/17/2019  Decreased Interest 0 0 0  Down, Depressed, Hopeless 0 0 0  PHQ - 2 Score 0 0 0    No flowsheet data found.  -------------------------------------------------------------------------- O: No physical exam  performed due to remote telephone encounter.  Lab results reviewed.  No results found for this or any previous visit (from the past 2160 hour(s)).  -------------------------------------------------------------------------- A&P:  Problem List Items Addressed This Visit   None Visit Diagnoses     Viral URI with cough    -  Primary   Relevant Medications   ipratropium (ATROVENT) 0.06 % nasal spray   benzonatate (TESSALON) 100 MG capsule   Flu-like symptoms          Clinically diagnosed flu like / viral syndrome No flu test or covid or rsv Husband negative covid x 3  Same symptoms   Duration x 4 days, without complication. - resolved nausea vomiting diarrhea now Tolerating PO and well hydrated - S/p influenza vaccine this season  Plan: Due to duration >4-5 days, will defer Tamiflu we discussed today, she is not interested in this rx. Start Atrovent nasal spray decongestant 2 sprays in each nostril up to 4 times daily for 7 days Start Tessalon Perls take 1 capsule up to 3 times a day as needed for cough Add mucinex OTC Supportive care as advised with NSAID / Tylenol PRN fever/myalgias, improve hydration, may take OTC Cold/Flu meds - Declined nausea medication  Return criteria given if significant worsening, consider post-influenza complications, otherwise follow-up if needed  Consider add Steroid / Antibiotic in 48 hours - she describes thicker yellow sputum, may consider progression to bacterial infection if unresolved.   Meds ordered this encounter  Medications   ipratropium (ATROVENT) 0.06 % nasal spray    Sig: Place 2 sprays into both nostrils 4 (four) times daily. For up to 5-7 days then stop.    Dispense:  15 mL    Refill:  0   benzonatate (TESSALON) 100 MG capsule    Sig: Take 1 capsule (100 mg total) by mouth 3 (three) times daily as needed for cough.    Dispense:  30 capsule    Refill:  0    Follow-up: - Return call in 48 hours if not resolved  Patient  verbalizes understanding with the above medical recommendations including the limitation of remote medical advice.  Specific follow-up and call-back criteria were given for patient to follow-up or seek medical care more urgently if needed.   - Time spent in direct consultation with patient on phone: 10 minutes   Nobie Putnam, [facility] 05/29/2021, 11:36 AM

## 2021-07-23 ENCOUNTER — Other Ambulatory Visit: Payer: Medicare Other

## 2021-08-09 ENCOUNTER — Ambulatory Visit (INDEPENDENT_AMBULATORY_CARE_PROVIDER_SITE_OTHER): Payer: Medicare Other | Admitting: Physician Assistant

## 2021-08-09 ENCOUNTER — Other Ambulatory Visit: Payer: Self-pay

## 2021-08-09 VITALS — BP 118/60 | HR 69 | Temp 99.2°F | Ht 64.0 in | Wt 166.0 lb

## 2021-08-09 DIAGNOSIS — J019 Acute sinusitis, unspecified: Secondary | ICD-10-CM

## 2021-08-09 DIAGNOSIS — R051 Acute cough: Secondary | ICD-10-CM

## 2021-08-09 MED ORDER — BENZONATATE 100 MG PO CAPS: 100.0000 mg | ORAL_CAPSULE | Freq: Two times a day (BID) | ORAL | 0 refills | Status: DC | PRN

## 2021-08-09 MED ORDER — AMOXICILLIN 875 MG PO TABS: 875.0000 mg | ORAL_TABLET | Freq: Two times a day (BID) | ORAL | 0 refills | Status: AC

## 2021-08-09 NOTE — Patient Instructions (Addendum)
Based on your exam I believe you have a bacterial sinusitis and I will send in an antibiotic for that  ? ?I am sending in Amoxicillin 875 mg to be taken twice per day for 7 days - please take with food and finish the entire course of medication ? ?I am also sending in a script for Tessalon pearls to assist with your cough. You can take these every 12 hours as needed to help with your cough ? You can use Mucinex and increase your water intake to help with mucus control ?You can use nasal saline sprays, humidifiers to assist with nasal irritation  ? ?If you have any of the following please go to the ED: fever, swelling around your eyes, trouble breathing, weakness, headaches that won't resolve with medication,  ?

## 2021-08-09 NOTE — Progress Notes (Signed)
? ? ?  ?    Acute Office Visit ? ? ?Patient: Vanessa Reynolds   DOB: Sep 28, 1945   76 y.o. Female  MRN: [id number] ?Visit Date: 08/09/2021 ? ?Today's healthcare provider: Dani Gobble Laylana Gerwig, PA-C  ?Introduced myself to the patient as a Journalist, newspaper and provided education on APPs in clinical practice.  ? ? ?Chief Complaint  ?Patient presents with  ? Cough  ? ?Subjective  ?  ?Cough ?Associated symptoms include chills, ear pain, headaches, myalgias, postnasal drip, rhinorrhea and shortness of breath. Pertinent negatives include no chest pain, fever, rash or sore throat.   ? ?States symptoms began about 2 weeks ago with voice hoarseness and rhinorrhea, no sore throat or congestion ?She states she has had a terrible cough since this started.  ?Negative COVID test at home a few days after symptom onset ? ?Thinks symptoms seem to have improved slightly but still reports productive cough.  ? ?Medications: ?Outpatient Medications Prior to Visit  ?Medication Sig  ? fluticasone (FLONASE) 50 MCG/ACT nasal spray PLACE 2 SPRAYS INTO BOTH NOSTRILS DAILY.USE FOR 4-6 WEEKS THEN STOP AND USE SEASONALLY OR AS NEEDED  ? ipratropium (ATROVENT) 0.06 % nasal spray Place 2 sprays into both nostrils 4 (four) times daily. For up to 5-7 days then stop.  ? lisinopril (ZESTRIL) 40 MG tablet Take 1 tablet (40 mg total) by mouth at bedtime.  ? loratadine (CLARITIN) 10 MG tablet Take 1 tablet (10 mg total) by mouth daily. Use for 4-6 weeks then stop, and use as needed or seasonally  ? lovastatin (MEVACOR) 20 MG tablet Take 1 tablet (20 mg total) by mouth at bedtime.  ? Multiple Vitamins-Minerals (ZINC PO) Take by mouth daily.  ? omeprazole (PRILOSEC) 20 MG capsule Take 1 capsule (20 mg total) by mouth daily before breakfast.  ? Probiotic Product (PROBIOTIC DAILY PO) Take 1 tablet by mouth daily.  ? [DISCONTINUED] benzonatate (TESSALON) 100 MG capsule Take 1 capsule (100 mg total) by mouth 3 (three) times daily as needed for cough.  ? ?No facility-administered  medications prior to visit.  ? ? ?Review of Systems  ?Constitutional:  Positive for appetite change, chills and fatigue. Negative for diaphoresis and fever.  ?HENT:  Positive for congestion, ear pain, postnasal drip, rhinorrhea, sinus pressure and voice change. Negative for sore throat and trouble swallowing.   ?Respiratory:  Positive for cough and shortness of breath.   ?Cardiovascular:  Negative for chest pain and palpitations.  ?Gastrointestinal:  Negative for diarrhea, nausea and vomiting.  ?Musculoskeletal:  Positive for myalgias.  ?Skin:  Negative for rash.  ?Neurological:  Positive for headaches. Negative for dizziness and light-headedness.  ? ? ?  Objective  ?  ?BP 118/60   Pulse 69   Temp 99.2 ?F (37.3 ?C)   Ht '5\' 4"'$  (1.626 m)   Wt 166 lb (75.3 kg)   SpO2 99%   BMI 28.49 kg/m?  ? ? ?Physical Exam ?Vitals reviewed.  ?Constitutional:   ?   General: She is awake.  ?   Appearance: Normal appearance. She is well-developed, well-groomed and overweight.  ?HENT:  ?   Head: Normocephalic and atraumatic.  ?   Right Ear: Tympanic membrane, ear canal and external ear normal.  ?   Left Ear: Tympanic membrane, ear canal and external ear normal.  ?   Mouth/Throat:  ?   Mouth: Mucous membranes are moist.  ?   Pharynx: Oropharynx is clear. Uvula midline. No oropharyngeal exudate or posterior oropharyngeal erythema.  ?  Tonsils: No tonsillar exudate or tonsillar abscesses.  ?Eyes:  ?   General: Lids are normal.  ?   Extraocular Movements: Extraocular movements intact.  ?   Conjunctiva/sclera: Conjunctivae normal.  ?   Pupils: Pupils are equal, round, and reactive to light.  ?Cardiovascular:  ?   Rate and Rhythm: Normal rate and regular rhythm.  ?   Pulses: Normal pulses.  ?   Heart sounds: Normal heart sounds.  ?Pulmonary:  ?   Effort: Pulmonary effort is normal.  ?   Breath sounds: No decreased air movement. Wheezing present. No decreased breath sounds, rhonchi or rales.  ?   Comments: Mild diffuse wheezing globally-  air movement intact ?No apparent increased work of breathing ?Musculoskeletal:  ?   Right lower leg: No edema.  ?   Left lower leg: No edema.  ?Lymphadenopathy:  ?   Head:  ?   Right side of head: No submental or submandibular adenopathy.  ?   Left side of head: No submental adenopathy.  ?   Upper Body:  ?   Right upper body: No supraclavicular adenopathy.  ?   Left upper body: No supraclavicular adenopathy.  ?Neurological:  ?   Mental Status: She is alert.  ?Psychiatric:     ?   Attention and Perception: Attention normal.     ?   Mood and Affect: Mood normal.     ?   Speech: Speech normal.     ?   Behavior: Behavior normal. Behavior is cooperative.  ?  ? ? ?No results found for any visits on 08/09/21. ? Assessment & Plan  ?  ? ?Problem List Items Addressed This Visit   ?None ?Visit Diagnoses   ? ? Acute non-recurrent sinusitis, unspecified location    -  Primary ?Acute, new problem ?Patient states she has had symptoms for approx 2 weeks with minimal improvement using OTC regimens ?States she took a home COVID test several days after symptom onset and this was negative ?Reports associated symptoms of cough, drainage, congestion, rhinorrhea  ?Will provide Amoxicillin 875 mg PO BID x 7 days to assist with acute sinusitis resolution ?Discussed symptomatic relief with OTC Mucinex, nasal saline sprays ?Follow up as needed for persistent or worsening symptoms  ?  ? Relevant Medications  ? amoxicillin (AMOXIL) 875 MG tablet  ? benzonatate (TESSALON) 100 MG capsule  ? Acute cough     ?Acute, new concern ?States this is correlated with current illness and she is having coughing fits along with productive cough ?Will provide Tessalon  pearls 100 mg PO BID PRN to assist with cough  ?Amoxicillin to assist with suspected bacterial sinusitis and associated symptoms ?Follow up as needed for persistent or worsening symptoms ?  ? Relevant Medications  ? benzonatate (TESSALON) 100 MG capsule  ? ?  ? ? ? ?No follow-ups on  file. ? ? ?I, Arbell Wycoff E Adison Reifsteck, PA-C, have reviewed all documentation for this visit. The documentation on 08/09/21 for the exam, diagnosis, procedures, and orders are all accurate and complete. ? ? ?Minor Iden, Glennie Isle MPH ?[place] ?[facility] ? ? ? ? ?No follow-ups on file.  ?   ? ? ? ? ?

## 2021-08-14 ENCOUNTER — Telehealth: Payer: Self-pay

## 2021-08-14 NOTE — Telephone Encounter (Signed)
Left message for patient to call back and schedule the Medicare Annual Wellness Visit (AWV) virtually, telephone or face to face. ?  ?Donnie Mesa, [place] ? ?[phone] ?

## 2021-08-20 ENCOUNTER — Other Ambulatory Visit: Payer: Medicare Other

## 2021-08-20 DIAGNOSIS — E538 Deficiency of other specified B group vitamins: Secondary | ICD-10-CM

## 2021-08-20 DIAGNOSIS — Z0000 Encounter for general adult medical examination without abnormal findings: Secondary | ICD-10-CM

## 2021-08-20 DIAGNOSIS — I10 Essential (primary) hypertension: Secondary | ICD-10-CM | POA: Diagnosis not present

## 2021-08-20 DIAGNOSIS — K219 Gastro-esophageal reflux disease without esophagitis: Secondary | ICD-10-CM

## 2021-08-20 DIAGNOSIS — E559 Vitamin D deficiency, unspecified: Secondary | ICD-10-CM

## 2021-08-20 DIAGNOSIS — E782 Mixed hyperlipidemia: Secondary | ICD-10-CM | POA: Diagnosis not present

## 2021-08-20 DIAGNOSIS — R7309 Other abnormal glucose: Secondary | ICD-10-CM | POA: Diagnosis not present

## 2021-08-21 LAB — CBC WITH DIFFERENTIAL/PLATELET
Absolute Monocytes: 348 cells/uL (ref 200–950)
Basophils Absolute: 39 cells/uL (ref 0–200)
Basophils Relative: 0.9 %
Eosinophils Absolute: 129 cells/uL (ref 15–500)
Eosinophils Relative: 3 %
HCT: 38.6 % (ref 35.0–45.0)
Hemoglobin: 12.7 g/dL (ref 11.7–15.5)
Lymphs Abs: 1449 cells/uL (ref 850–3900)
MCH: 30.5 pg (ref 27.0–33.0)
MCHC: 32.9 g/dL (ref 32.0–36.0)
MCV: 92.6 fL (ref 80.0–100.0)
MPV: 12.6 fL — ABNORMAL HIGH (ref 7.5–12.5)
Monocytes Relative: 8.1 %
Neutro Abs: 2335 cells/uL (ref 1500–7800)
Neutrophils Relative %: 54.3 %
Platelets: 175 10*3/uL (ref 140–400)
RBC: 4.17 10*6/uL (ref 3.80–5.10)
RDW: 12.8 % (ref 11.0–15.0)
Total Lymphocyte: 33.7 %
WBC: 4.3 10*3/uL (ref 3.8–10.8)

## 2021-08-21 LAB — LIPID PANEL
Cholesterol: 238 mg/dL — ABNORMAL HIGH (ref <200)
HDL: 69 mg/dL (ref >=50)
LDL Cholesterol (Calc): 151 mg/dL (calc) — ABNORMAL HIGH
Non-HDL Cholesterol (Calc): 169 mg/dL (calc) — ABNORMAL HIGH (ref <130)
Total CHOL/HDL Ratio: 3.4 (calc) (ref <5.0)
Triglycerides: 78 mg/dL (ref <150)

## 2021-08-21 LAB — COMPLETE METABOLIC PANEL WITH GFR
AG Ratio: 1 (calc) (ref 1.0–2.5)
ALT: 35 U/L — ABNORMAL HIGH (ref 6–29)
AST: 45 U/L — ABNORMAL HIGH (ref 10–35)
Albumin: 3.6 g/dL (ref 3.6–5.1)
Alkaline phosphatase (APISO): 307 U/L — ABNORMAL HIGH (ref 37–153)
BUN: 16 mg/dL (ref 7–25)
CO2: 25 mmol/L (ref 20–32)
Calcium: 9.3 mg/dL (ref 8.6–10.4)
Chloride: 106 mmol/L (ref 98–110)
Creat: 0.77 mg/dL (ref 0.60–1.00)
Globulin: 3.7 g/dL (calc) (ref 1.9–3.7)
Glucose, Bld: 84 mg/dL (ref 65–99)
Potassium: 4.2 mmol/L (ref 3.5–5.3)
Sodium: 140 mmol/L (ref 135–146)
Total Bilirubin: 0.5 mg/dL (ref 0.2–1.2)
Total Protein: 7.3 g/dL (ref 6.1–8.1)
eGFR: 80 mL/min/{1.73_m2} (ref >=60)

## 2021-08-21 LAB — HEMOGLOBIN A1C
Hgb A1c MFr Bld: 5.4 % of total Hgb (ref <5.7)
Mean Plasma Glucose: 108 mg/dL
eAG (mmol/L): 6 mmol/L

## 2021-08-21 LAB — VITAMIN B12: Vitamin B-12: 1008 pg/mL (ref 200–1100)

## 2021-08-21 LAB — TSH: TSH: 1.21 mIU/L (ref 0.40–4.50)

## 2021-08-21 LAB — VITAMIN D 25 HYDROXY (VIT D DEFICIENCY, FRACTURES): Vit D, 25-Hydroxy: 41 ng/mL (ref 30–100)

## 2021-08-27 ENCOUNTER — Ambulatory Visit (INDEPENDENT_AMBULATORY_CARE_PROVIDER_SITE_OTHER): Payer: Medicare Other

## 2021-08-27 ENCOUNTER — Ambulatory Visit (INDEPENDENT_AMBULATORY_CARE_PROVIDER_SITE_OTHER): Payer: Medicare Other | Admitting: Family Medicine

## 2021-08-27 ENCOUNTER — Encounter: Payer: Self-pay | Admitting: Family Medicine

## 2021-08-27 VITALS — BP 138/78 | HR 62 | Temp 98.1°F | Resp 17 | Ht 63.5 in | Wt 167.2 lb

## 2021-08-27 VITALS — BP 138/78 | HR 62 | Temp 98.1°F | Resp 17 | Ht 63.5 in | Wt 168.0 lb

## 2021-08-27 DIAGNOSIS — Z0000 Encounter for general adult medical examination without abnormal findings: Secondary | ICD-10-CM

## 2021-08-27 DIAGNOSIS — Z23 Encounter for immunization: Secondary | ICD-10-CM

## 2021-08-27 DIAGNOSIS — I700 Atherosclerosis of aorta: Secondary | ICD-10-CM | POA: Diagnosis not present

## 2021-08-27 DIAGNOSIS — D329 Benign neoplasm of meninges, unspecified: Secondary | ICD-10-CM | POA: Diagnosis not present

## 2021-08-27 DIAGNOSIS — K219 Gastro-esophageal reflux disease without esophagitis: Secondary | ICD-10-CM

## 2021-08-27 DIAGNOSIS — I10 Essential (primary) hypertension: Secondary | ICD-10-CM

## 2021-08-27 DIAGNOSIS — E7849 Other hyperlipidemia: Secondary | ICD-10-CM | POA: Diagnosis not present

## 2021-08-27 DIAGNOSIS — E782 Mixed hyperlipidemia: Secondary | ICD-10-CM

## 2021-08-27 DIAGNOSIS — E663 Overweight: Secondary | ICD-10-CM

## 2021-08-27 MED ORDER — LISINOPRIL 40 MG PO TABS: 40.0000 mg | ORAL_TABLET | Freq: Every day | ORAL | 3 refills | Status: DC

## 2021-08-27 MED ORDER — LOVASTATIN 20 MG PO TABS: 20.0000 mg | ORAL_TABLET | Freq: Every day | ORAL | 3 refills | Status: DC

## 2021-08-27 MED ORDER — OMEPRAZOLE 20 MG PO CPDR: 20.0000 mg | DELAYED_RELEASE_CAPSULE | Freq: Every day | ORAL | 3 refills | Status: DC

## 2021-08-27 NOTE — Progress Notes (Signed)
? ?Subjective:  ? Vanessa Reynolds is a 76 y.o. female who presents for Medicare Annual (Subsequent) preventive examination. ? ?Review of Systems   Per HPI unless specifically indicated below ? ?   ?Objective:  ?  ?Today's Vitals  ? 08/27/21 0818  ?BP: 138/78  ?Pulse: 62  ?Resp: 17  ?Temp: 98.1 ?F (36.7 ?C)  ?TempSrc: Oral  ?SpO2: 99%  ?Weight: 167 lb 3.2 oz (75.8 kg)  ?Height: 5' 3.5" (1.613 m)  ?PainSc: 0-No pain  ? ?Body mass index is 29.15 kg/m?. ? ? ?  01/05/2020  ?  7:57 AM 01/03/2020  ? 12:20 PM 10/11/2019  ?  2:57 PM 06/07/2019  ?  9:09 AM 07/14/2018  ? 11:52 AM 07/01/2017  ? 11:55 AM  ?Advanced Directives  ?Does Patient Have a Medical Advance Directive? No No No No No No  ?Would patient like information on creating a medical advance directive?  No - Patient declined No - Patient declined  Yes (MAU/Ambulatory/Procedural Areas - Information given) Yes (MAU/Ambulatory/Procedural Areas - Information given)  ? ? ?Current Medications (verified) ?Outpatient Encounter Medications as of 08/27/2021  ?Medication Sig  ? Apple Cider Vinegar 188 MG CAPS Take by mouth.  ? fluticasone (FLONASE) 50 MCG/ACT nasal spray PLACE 2 SPRAYS INTO BOTH NOSTRILS DAILY.USE FOR 4-6 WEEKS THEN STOP AND USE SEASONALLY OR AS NEEDED  ? lisinopril (ZESTRIL) 40 MG tablet Take 1 tablet (40 mg total) by mouth at bedtime.  ? loratadine (CLARITIN) 10 MG tablet Take 1 tablet (10 mg total) by mouth daily. Use for 4-6 weeks then stop, and use as needed or seasonally  ? omeprazole (PRILOSEC) 20 MG capsule Take 1 capsule (20 mg total) by mouth daily before breakfast.  ? Red Yeast Rice 600 MG CAPS Take by mouth.  ? [DISCONTINUED] Multiple Vitamins-Minerals (ZINC PO) Take by mouth daily. (Patient not taking: Reported on 08/27/2021)  ? lovastatin (MEVACOR) 20 MG tablet Take 1 tablet (20 mg total) by mouth at bedtime.  ? [DISCONTINUED] benzonatate (TESSALON) 100 MG capsule Take 1 capsule (100 mg total) by mouth 2 (two) times daily as needed for cough.  ?  [DISCONTINUED] ipratropium (ATROVENT) 0.06 % nasal spray Place 2 sprays into both nostrils 4 (four) times daily. For up to 5-7 days then stop. (Patient not taking: Reported on 08/27/2021)  ? [DISCONTINUED] Probiotic Product (PROBIOTIC DAILY PO) Take 1 tablet by mouth daily. (Patient not taking: Reported on 08/27/2021)  ? ?No facility-administered encounter medications on file as of 08/27/2021.  ? ? ?Allergies (verified) ?Fish oil  ? ?History: ?Past Medical History:  ?Diagnosis Date  ? Abdominal pain, left lower quadrant 2012  ? Allergy   ? Breast screening, unspecified   ? H/O cystitis 2011  ? History of colon cancer   ? Hyperlipidemia   ? Hypertension   ? Nausea with vomiting   ? Personal history of malignant neoplasm of large intestine 2013  ? Personal history of tobacco use, presenting hazards to health   ? Rectal cancer ([place]) 04/2010  ? 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  ? Rectal cancer ([place])   ? ?Past Surgical History:  ?Procedure Laterality Date  ? APPENDECTOMY  December 2011  ? BILATERAL OOPHORECTOMY  December 2011  ? Completed at time of low anterior resection.  ? COLON SURGERY  03/14/10  ? lap assisted low anterior resection-adenocarcinoma of the rectum and a villous adenoma  ? COLONOSCOPY  2011,2012,2015  ? Dr. Vira Agar, Dr. Addison Lank)  ? COLONOSCOPY WITH  PROPOFOL N/A 01/05/2020  ? Procedure: COLONOSCOPY WITH PROPOFOL;  Surgeon: Robert Bellow, MD;  Location: [facility] ENDOSCOPY;  Service: Endoscopy;  Laterality: N/A;  ? ?Family History  ?Problem Relation Age of Onset  ? Hypertension Mother   ?     deceased, age 80  ? Cancer Father   ?     gastric cancer, deceased, mid 59's  ? Breast cancer Neg Hx   ? ?Social History  ? ?Socioeconomic History  ? Marital status: Married  ?  Spouse name: Shanoah Asbill  ? Number of children: 3  ? Years of education: Not on file  ? Highest education level: 10th grade  ?Occupational History  ?  Comment: fulltime   ? Occupation: Retired  ?Tobacco Use  ?  Smoking status: Former  ?  Packs/day: 2.00  ?  Years: 30.00  ?  Pack years: 60.00  ?  Types: Cigarettes  ?  Quit date: 05/20/1997  ?  Years since quitting: 24.2  ? Smokeless tobacco: Former  ?Vaping Use  ? Vaping Use: Never used  ?Substance and Sexual Activity  ? Alcohol use: No  ? Drug use: No  ? Sexual activity: Not on file  ?Other Topics Concern  ? Not on file  ?Social History Narrative  ? Not on file  ? ?Social Determinants of Health  ? ?Financial Resource Strain: Low Risk   ? Difficulty of Paying Living Expenses: Not hard at all  ?Food Insecurity: No Food Insecurity  ? Worried About Charity fundraiser in the Last Year: Never true  ? Ran Out of Food in the Last Year: Never true  ?Transportation Needs: No Transportation Needs  ? Lack of Transportation (Medical): No  ? Lack of Transportation (Non-Medical): No  ?Physical Activity: Inactive  ? Days of Exercise per Week: 0 days  ? Minutes of Exercise per Session: 0 min  ?Stress: No Stress Concern Present  ? Feeling of Stress : Not at all  ?Social Connections: Socially Integrated  ? Frequency of Communication with Friends and Family: More than three times a week  ? Frequency of Social Gatherings with Friends and Family: Not on file  ? Attends Religious Services: More than 4 times per year  ? Active Member of Clubs or Organizations: Yes  ? Attends Archivist Meetings: More than 4 times per year  ? Marital Status: Married  ? ? ?Tobacco Counseling ?Counseling given: Not Answered ? ? ?Clinical Intake: ? ?Pre-visit preparation completed: No ? ?Pain : No/denies pain ?Pain Score: 0-No pain ? ?  ? ?Nutritional Status: BMI of 19-24  Normal ?Diabetes: No ? ?How often do you need to have someone help you when you read instructions, pamphlets, or other written materials from your doctor or pharmacy?: 2 - Rarely ? ?Diabetic?No  ? ?Interpreter Needed?: No ? ?Information entered by :: Donnie Mesa ? ? ?Activities of Daily Living ? ?  08/27/2021  ?  8:22 AM  ?In your  present state of health, do you have any difficulty performing the following activities:  ?Hearing? 1  ?Comment Difficulty with hearing in the Right Ear  ?Vision? 1  ?Difficulty concentrating or making decisions? 0  ?Walking or climbing stairs? 0  ?Dressing or bathing? 0  ?Doing errands, shopping? 0  ?Preparing Food and eating ? N  ?Using the Toilet? N  ?In the past six months, have you accidently leaked urine? Y  ?Do you have problems with loss of bowel control? N  ?Managing your Medications? N  ?  Managing your Finances? N  ?Housekeeping or managing your Housekeeping? N  ? ? ?Patient Care Team: ?Olin Hauser, DO as PCP - General (Family Medicine) ?Kate Sable, MD as PCP - Cardiology (Cardiology) ?Robert Bellow, MD (General Surgery) ? ?Indicate any recent Medical Services you may have received from other than [facility] providers in the past year (date may be approximate). ? ?No hospitalization in the past 12 months.  ? ?   ?Assessment:  ? This is a routine wellness examination for [facility] ? ?Hearing/Vision screen ?Vision Screening  ? Right eye Left eye Both eyes  ?Without correction '20/40 20/40 20/40 '$  ?With correction     ? ? ?Dietary issues and exercise activities discussed: ?Current Exercise Habits: Home exercise routine, Type of exercise: stretching, Time (Minutes): 10, Frequency (Times/Week): 4, Weekly Exercise (Minutes/Week): 40, Intensity: Mild, Exercise limited by: None identified ?Pt reports she normally eat just two meals a day. She normally eat mostly vegetables. ? ? Goals Addressed   ?None ?  ? ?Depression Screen ? ?  08/27/2021  ?  8:18 AM 04/17/2020  ? 10:51 AM 01/11/2020  ?  1:21 PM 08/17/2019  ? 10:29 AM 08/05/2019  ? 10:51 AM 07/14/2018  ? 11:47 AM 07/06/2018  ?  9:02 AM  ?PHQ 2/9 Scores  ?PHQ - 2 Score 0 0 0 0 0 0 0  ?  ?Fall Risk ? ?  08/27/2021  ?  8:22 AM 04/17/2020  ? 10:51 AM 01/11/2020  ?  1:20 PM 08/17/2019  ? 10:29 AM 08/05/2019  ? 10:49 AM  ?Fall Risk   ?Falls in the past year? 0 0 0  0 0  ?Number falls in past yr: 0 0 0 0 0  ?Injury with Fall? 0 0 0 0 0  ?Risk for fall due to : No Fall Risks      ?Follow up Falls evaluation completed Falls evaluation completed Falls evaluation completed  Falls evalua

## 2021-08-27 NOTE — Progress Notes (Signed)
? ?Subjective:  ? ? Patient ID: Vanessa Reynolds, female    DOB: 1945/06/12, 76 y.o.   MRN: [id number] ? ?Vanessa Reynolds is a 76 y.o. female presenting on 08/27/2021 for Annual Exam ? ?She has completed AMW with Donnie Mesa CMA today ? ?HPI ? ?Here for Annual Physical and Lab Review. ? ? ?CHRONIC HTN ?Hyperlipidemia ?History of possible TIA ?Meningioma ?  ?Followed by [facility] Neurosurgery Dr Izora Ribas for Meningioma surveillance. ?Upcoming MRI  ?  ?Followed by Dr Delana Meyer, Vascular ?  ?Elevated BP now recently. Home Bp not checking as often but she can monitor it more. Admits salt and stress can raise BP ?  ?She is taking Lisinopril '40mg'$  daily. ?  ?Reports good compliance, took meds today. Tolerating well, w/o complaints. ?Denies CP, dyspnea, HA, edema / lightheadedness  ?  ?Atherosclerosis Aorta ?On imaging CT 2021, see below ?On statin therapy ? ?Podiatry ?[facility] - followed by them. She has bilateral foot pain but improved w/ flip flops. She was advised consider surgery but has delayed this. ? ?Vitamin D ?Lab controlled in range. ?On Supplement ? ?Vitamin B12 Deficiency ?Continues on injection therapy. Good results B12 1008. Feels more energy ?  ?Health Maintenance: ?COVID Vaccine J&J done 09/2019, no booster dose. She declines booster ? ?Completed Pneumonia vaccine update today, pneumovax-23 done. ? ?Future Shingles vaccine when ready. ? ?Colonoscopy updated 12/2019, next due 5 years, 2026. ? ? ?  08/27/2021  ?  8:18 AM 04/17/2020  ? 10:51 AM 01/11/2020  ?  1:21 PM  ?Depression screen PHQ 2/9  ?Decreased Interest 0 0 0  ?Down, Depressed, Hopeless 0 0 0  ?PHQ - 2 Score 0 0 0  ? ? ?Past Medical History:  ?Diagnosis Date  ? Abdominal pain, left lower quadrant 2012  ? Allergy   ? Breast screening, unspecified   ? H/O cystitis 2011  ? History of colon cancer   ? Hyperlipidemia   ? Hypertension   ? Nausea with vomiting   ? Personal history of malignant neoplasm of large intestine 2013  ? Personal history of tobacco use,  presenting hazards to health   ? Rectal cancer ([place]) 04/2010  ? 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  ? Rectal cancer ([place])   ? ?Past Surgical History:  ?Procedure Laterality Date  ? APPENDECTOMY  December 2011  ? BILATERAL OOPHORECTOMY  December 2011  ? Completed at time of low anterior resection.  ? COLON SURGERY  03/14/10  ? lap assisted low anterior resection-adenocarcinoma of the rectum and a villous adenoma  ? COLONOSCOPY  2011,2012,2015  ? Dr. Vira Agar, Dr. Addison Lank)  ? COLONOSCOPY WITH PROPOFOL N/A 01/05/2020  ? Procedure: COLONOSCOPY WITH PROPOFOL;  Surgeon: Robert Bellow, MD;  Location: [facility] ENDOSCOPY;  Service: Endoscopy;  Laterality: N/A;  ? ?Social History  ? ?Socioeconomic History  ? Marital status: Married  ?  Spouse name: Braeley Buskey  ? Number of children: 3  ? Years of education: Not on file  ? Highest education level: 10th grade  ?Occupational History  ?  Comment: fulltime   ? Occupation: Retired  ?Tobacco Use  ? Smoking status: Former  ?  Packs/day: 2.00  ?  Years: 30.00  ?  Pack years: 60.00  ?  Types: Cigarettes  ?  Quit date: 05/20/1997  ?  Years since quitting: 24.2  ? Smokeless tobacco: Former  ?Vaping Use  ? Vaping Use: Never used  ?Substance and Sexual Activity  ? Alcohol use:  No  ? Drug use: No  ? Sexual activity: Not on file  ?Other Topics Concern  ? Not on file  ?Social History Narrative  ? Not on file  ? ?Social Determinants of Health  ? ?Financial Resource Strain: Low Risk   ? Difficulty of Paying Living Expenses: Not hard at all  ?Food Insecurity: No Food Insecurity  ? Worried About Charity fundraiser in the Last Year: Never true  ? Ran Out of Food in the Last Year: Never true  ?Transportation Needs: No Transportation Needs  ? Lack of Transportation (Medical): No  ? Lack of Transportation (Non-Medical): No  ?Physical Activity: Inactive  ? Days of Exercise per Week: 0 days  ? Minutes of Exercise per Session: 0 min  ?Stress: No Stress Concern Present  ?  Feeling of Stress : Not at all  ?Social Connections: Socially Integrated  ? Frequency of Communication with Friends and Family: More than three times a week  ? Frequency of Social Gatherings with Friends and Family: Not on file  ? Attends Religious Services: More than 4 times per year  ? Active Member of Clubs or Organizations: Yes  ? Attends Archivist Meetings: More than 4 times per year  ? Marital Status: Married  ?Intimate Partner Violence: Not on file  ? ?Family History  ?Problem Relation Age of Onset  ? Hypertension Mother   ?     deceased, age 44  ? Cancer Father   ?     gastric cancer, deceased, mid 15's  ? Breast cancer Neg Hx   ? ?Current Outpatient Medications on File Prior to Visit  ?Medication Sig  ? fluticasone (FLONASE) 50 MCG/ACT nasal spray PLACE 2 SPRAYS INTO BOTH NOSTRILS DAILY.USE FOR 4-6 WEEKS THEN STOP AND USE SEASONALLY OR AS NEEDED  ? loratadine (CLARITIN) 10 MG tablet Take 1 tablet (10 mg total) by mouth daily. Use for 4-6 weeks then stop, and use as needed or seasonally  ? vitamin B-12 (CYANOCOBALAMIN) 100 MCG tablet Take 100 mcg by mouth daily.  ? ?No current facility-administered medications on file prior to visit.  ? ? ?Review of Systems  ?Constitutional:  Negative for activity change, appetite change, chills, diaphoresis, fatigue and fever.  ?HENT:  Negative for congestion and hearing loss.   ?Eyes:  Negative for visual disturbance.  ?Respiratory:  Negative for cough, chest tightness, shortness of breath and wheezing.   ?Cardiovascular:  Negative for chest pain, palpitations and leg swelling.  ?Gastrointestinal:  Negative for abdominal pain, constipation, diarrhea, nausea and vomiting.  ?Genitourinary:  Negative for dysuria, frequency and hematuria.  ?Musculoskeletal:  Negative for arthralgias and neck pain.  ?Skin:  Negative for rash.  ?Neurological:  Negative for dizziness, weakness, light-headedness, numbness and headaches.  ?Hematological:  Negative for adenopathy.   ?Psychiatric/Behavioral:  Negative for behavioral problems, dysphoric mood and sleep disturbance.   ?Per HPI unless specifically indicated above ? ? ?   ?Objective:  ?  ?BP 138/78 (BP Location: Left Arm, Patient Position: Sitting, Cuff Size: Normal)   Pulse 62   Temp 98.1 ?F (36.7 ?C) (Oral)   Resp 17   Ht 5' 3.5" (1.613 m)   Wt 168 lb (76.2 kg)   SpO2 99%   BMI 29.29 kg/m?   ?Wt Readings from Last 3 Encounters:  ?08/27/21 168 lb (76.2 kg)  ?08/27/21 167 lb 3.2 oz (75.8 kg)  ?08/09/21 166 lb (75.3 kg)  ?  ?Physical Exam ?Vitals and nursing note reviewed.  ?Constitutional:   ?  General: She is not in acute distress. ?   Appearance: She is well-developed. She is not diaphoretic.  ?   Comments: Well-appearing, comfortable, cooperative  ?HENT:  ?   Head: Normocephalic and atraumatic.  ?Eyes:  ?   General:     ?   Right eye: No discharge.     ?   Left eye: No discharge.  ?   Conjunctiva/sclera: Conjunctivae normal.  ?   Pupils: Pupils are equal, round, and reactive to light.  ?Neck:  ?   Thyroid: No thyromegaly.  ?   Vascular: No carotid bruit.  ?Cardiovascular:  ?   Rate and Rhythm: Normal rate and regular rhythm.  ?   Pulses: Normal pulses.  ?   Heart sounds: Normal heart sounds. No murmur heard. ?Pulmonary:  ?   Effort: Pulmonary effort is normal. No respiratory distress.  ?   Breath sounds: Normal breath sounds. No wheezing or rales.  ?Abdominal:  ?   General: Bowel sounds are normal. There is no distension.  ?   Palpations: Abdomen is soft. There is no mass.  ?   Tenderness: There is no abdominal tenderness.  ?Musculoskeletal:     ?   General: No tenderness. Normal range of motion.  ?   Cervical back: Normal range of motion and neck supple.  ?   Right lower leg: No edema.  ?   Left lower leg: No edema.  ?   Comments: Upper / Lower Extremities: ?- Normal muscle tone, strength bilateral upper extremities 5/5, lower extremities 5/5  ?Lymphadenopathy:  ?   Cervical: No cervical adenopathy.  ?Skin: ?   General:  Skin is warm and dry.  ?   Findings: No erythema or rash.  ?Neurological:  ?   Mental Status: She is alert and oriented to person, place, and time.  ?   Comments: Distal sensation intact to light touch all extremities  ?[place]

## 2021-08-27 NOTE — Patient Instructions (Addendum)
Thank you for coming to the office today. ? ?Future Shingles vaccine at pharmacy or here, 2 doses, 2-6 months apart, free of charge. ? ?Keep up the great work with the blood pressure (<140/90), and sugar. ? ?Main goal today is get back on the cholesterol medicine. Rx sent. ? ?Can stop the Red Rice Yeast. ? ?Rest of labs look great. ? ?Please schedule a Follow-up Appointment to: Return in about 6 months (around 02/26/2022) for 6 month follow-up HTN. ? ?If you have any other questions or concerns, please feel free to call the office or send a message through [place]. You may also schedule an earlier appointment if necessary. ? ?Additionally, you may be receiving a survey about your experience at our office within a few days to 1 week by e-mail or mail. We value your feedback. ? ?Nobie Putnam, DO ?[place] ?

## 2021-08-27 NOTE — Patient Instructions (Signed)
Fat and Cholesterol Restricted Eating Plan ?Eating a diet that limits fat and cholesterol may help lower your risk for heart disease and other conditions. Your body needs fat and cholesterol for basic functions, but eating too much of these things can be harmful to your health. ?Your health care provider may order lab tests to check your blood fat (lipid) and cholesterol levels. This helps your health care provider understand your risk for certain conditions and whether you need to make diet changes. Work with your health care provider or dietitian to make an eating plan that is right for you. ?Your plan includes: ?Limit your fat intake to ______% or less of your total calories a day. This is ______g of fat per day. ?Limit your saturated fat intake to ______% or less of your total calories a day. This is ______g of saturated fat per day. ?Limit the amount of cholesterol in your diet to less than _________mg a day. ?Eat ___________ g of fiber a day. ?What are tips for following this plan? ?General guidelines ?If you are overweight, work with your health care provider to lose weight safely. Losing just 5-10% of your body weight can improve your overall health and help prevent diseases such as diabetes and heart disease. ?Avoid: ?Foods with added sugar. ?Fried foods. ?Foods that contain partially hydrogenated oils, including stick margarine, some tub margarines, cookies, crackers, and other baked goods. ?If you drink alcohol: ?Limit how much you have to: ?0-1 drink a day for women who are not pregnant. ?0-2 drinks a day for men. ?Know how much alcohol is in a drink. In the [place] one drink equals one 12 oz bottle of beer (355 mL), one 5 oz glass of wine (148 mL), or one 1? oz glass of hard liquor (44 mL). ?Reading food labels ?Check food labels for: ?Trans fats or partially hydrogenated oils. Avoid foods that contain these. ?High amounts of saturated fat. Choose foods that are low in saturated fat (less than 2 g). ?The  amount of cholesterol in each serving. ?The amount of fiber in each serving. ?Choose foods with healthy fats, such as: ?Monounsaturated and polyunsaturated fats. These include olive and canola oil, flaxseeds, walnuts, almonds, and seeds. ?Omega-3 fats. These are found in foods such as salmon, mackerel, sardines, tuna, flaxseed oil, and ground flaxseeds. ?Choose grain products that have whole grains. Look for the word "whole" as the first word in the ingredient list. ?Cooking ?Cook foods using methods other than frying. Baking, boiling, grilling, and broiling are some healthy options. ?Eat more home-cooked food and less restaurant, buffet, and fast food. ?Avoid cooking using saturated fats. ?Animal sources of saturated fats include meats, butter, and cream. ?Plant sources of saturated fats include palm oil, palm kernel oil, and coconut oil. ?Meal planning ? ?At meals, imagine dividing your plate into fourths: ?Fill one-half of your plate with vegetables, green salads, and fruit. ?Fill one-fourth of your plate with whole grains. ?Fill one-fourth of your plate with lean protein foods. ?Eat fish that is high in omega-3 fats at least two times a week. ?Eat more foods that contain fiber, such as whole grains, beans, apples, pears, berries, broccoli, carrots, peas, and barley. These foods help promote healthy cholesterol levels in the blood. ?What foods should I eat? ?Fruits ?All fresh, canned (in natural juice), or frozen fruits. ?Vegetables ?Fresh or frozen vegetables (raw, steamed, roasted, or grilled). Green salads. ?Grains ?Whole grains, such as whole wheat or whole grain breads, crackers, cereals, and pasta. Unsweetened oatmeal, bulgur,  barley, quinoa, or brown rice. Corn or whole wheat flour tortillas. ?Meats and other proteins ?Ground beef (85% or leaner), grass-fed beef, or beef trimmed of fat. Skinless chicken or [place]. Ground chicken or [place]. Pork trimmed of fat. All fish and seafood. Egg whites. Dried beans,  peas, or lentils. Unsalted nuts or seeds. Unsalted canned beans. Natural nut butters without added sugar and oil. ?Dairy ?Low-fat or nonfat dairy products, such as skim or 1% milk, 2% or reduced-fat cheeses, low-fat and fat-free ricotta or cottage cheese, or plain low-fat and nonfat yogurt. ?Fats and oils ?Tub margarine without trans fats. Light or reduced-fat mayonnaise and salad dressings. Avocado. Olive, canola, sesame, or safflower oils. ?The items listed above may not be a complete list of foods and beverages you can eat. Contact a dietitian for more information. ?What foods should I avoid? ?Fruits ?Canned fruit in heavy syrup. Fruit in cream or butter sauce. Fried fruit. ?Vegetables ?Vegetables cooked in cheese, cream, or butter sauce. Fried vegetables. ?Grains ?White bread. White pasta. White rice. Cornbread. Bagels, pastries, and croissants. Crackers and snack foods that contain trans fat and hydrogenated oils. ?Meats and other proteins ?Fatty cuts of meat. Ribs, chicken wings, bacon, sausage, bologna, salami, chitterlings, fatback, hot dogs, bratwurst, and packaged lunch meats. Liver and organ meats. Whole eggs and egg yolks. Chicken and [place] with skin. Fried meat. ?Dairy ?Whole or 2% milk, cream, half-and-half, and cream cheese. Whole milk cheeses. Whole-fat or sweetened yogurt. Full-fat cheeses. Nondairy creamers and whipped toppings. Processed cheese, cheese spreads, and cheese curds. ?Fats and oils ?Butter, stick margarine, lard, shortening, ghee, or bacon fat. Coconut, palm kernel, and palm oils. ?Beverages ?Alcohol. Sugar-sweetened drinks such as sodas, lemonade, and fruit drinks. ?Sweets and desserts ?Corn syrup, sugars, honey, and molasses. Candy. Jam and jelly. Syrup. Sweetened cereals. Cookies, pies, cakes, donuts, muffins, and ice cream. ?The items listed above may not be a complete list of foods and beverages you should avoid. Contact a dietitian for more information. ?Summary ?Your body needs  fat and cholesterol for basic functions. However, eating too much of these things can be harmful to your health. ?Work with your health care provider and dietitian to follow a diet that limits fat and cholesterol. Doing this may help lower your risk for heart disease and other conditions. ?Choose healthy fats, such as monounsaturated and polyunsaturated fats, and foods high in omega-3 fatty acids. ?Eat fiber-rich foods, such as whole grains, beans, peas, fruits, and vegetables. ?Limit or avoid alcohol, fried foods, and foods high in saturated fats, partially hydrogenated oils, and sugar. ?This information is not intended to replace advice given to you by your health care provider. Make sure you discuss any questions you have with your health care provider. ?Document Revised: 09/15/2020 Document Reviewed: 09/15/2020 ?Elsevier Patient Education ? [place]. ? ?Health Maintenance, Female ?Adopting a healthy lifestyle and getting preventive care are important in promoting health and wellness. Ask your health care provider about: ?The right schedule for you to have regular tests and exams. ?Things you can do on your own to prevent diseases and keep yourself healthy. ?What should I know about diet, weight, and exercise? ?Eat a healthy diet ? ?Eat a diet that includes plenty of vegetables, fruits, low-fat dairy products, and lean protein. ?Do not eat a lot of foods that are high in solid fats, added sugars, or sodium. ?Maintain a healthy weight ?Body mass index (BMI) is used to identify weight problems. It estimates body fat based on height and weight.  Your health care provider can help determine your BMI and help you achieve or maintain a healthy weight. ?Get regular exercise ?Get regular exercise. This is one of the most important things you can do for your health. Most adults should: ?Exercise for at least 150 minutes each week. The exercise should increase your heart rate and make you sweat (moderate-intensity  exercise). ?Do strengthening exercises at least twice a week. This is in addition to the moderate-intensity exercise. ?Spend less time sitting. Even light physical activity can be beneficial. ?Watch

## 2021-09-24 ENCOUNTER — Ambulatory Visit (INDEPENDENT_AMBULATORY_CARE_PROVIDER_SITE_OTHER): Payer: Medicare Other | Admitting: Family Medicine

## 2021-09-24 ENCOUNTER — Encounter: Payer: Self-pay | Admitting: Family Medicine

## 2021-09-24 VITALS — Wt 168.0 lb

## 2021-09-24 DIAGNOSIS — R300 Dysuria: Secondary | ICD-10-CM

## 2021-09-24 DIAGNOSIS — N3000 Acute cystitis without hematuria: Secondary | ICD-10-CM

## 2021-09-24 MED ORDER — CEPHALEXIN 500 MG PO CAPS: 500.0000 mg | ORAL_CAPSULE | Freq: Three times a day (TID) | ORAL | 0 refills | Status: DC

## 2021-09-24 NOTE — Progress Notes (Signed)
? ?Subjective:  ? ? Patient ID: Vanessa Reynolds, female    DOB: 1945-06-30, 76 y.o.   MRN: [id number] ? ?Vanessa Reynolds is a 76 y.o. female presenting on 09/24/2021 for urinary burning  ? ?Patient presents for a same day appointment. ? ?HPI ? ?UTI Dysuria ?Here with husband actually with same complaint. She reports recent onset dysuria persistent burning and discomfort. She has tried to drink more water. No recent UTI has had similar in past. ? ?Denies fever chills back pain nausea vomiting  ? ? ?  08/27/2021  ?  8:18 AM 04/17/2020  ? 10:51 AM 01/11/2020  ?  1:21 PM  ?Depression screen PHQ 2/9  ?Decreased Interest 0 0 0  ?Down, Depressed, Hopeless 0 0 0  ?PHQ - 2 Score 0 0 0  ? ? ?Social History  ? ?Tobacco Use  ? Smoking status: Former  ?  Packs/day: 2.00  ?  Years: 30.00  ?  Pack years: 60.00  ?  Types: Cigarettes  ?  Quit date: 05/20/1997  ?  Years since quitting: 24.3  ? Smokeless tobacco: Former  ?Vaping Use  ? Vaping Use: Never used  ?Substance Use Topics  ? Alcohol use: No  ? Drug use: No  ? ? ?Review of Systems ?Per HPI unless specifically indicated above ? ?   ?Objective:  ?  ?Wt 168 lb (76.2 kg)   BMI 29.29 kg/m?   ?Wt Readings from Last 3 Encounters:  ?09/24/21 168 lb (76.2 kg)  ?08/27/21 168 lb (76.2 kg)  ?08/27/21 167 lb 3.2 oz (75.8 kg)  ?  ?Physical Exam ?Vitals and nursing note reviewed.  ?Constitutional:   ?   General: She is not in acute distress. ?   Appearance: Normal appearance. She is well-developed. She is not diaphoretic.  ?   Comments: Well-appearing, comfortable, cooperative  ?HENT:  ?   Head: Normocephalic and atraumatic.  ?Eyes:  ?   General:     ?   Right eye: No discharge.     ?   Left eye: No discharge.  ?   Conjunctiva/sclera: Conjunctivae normal.  ?Cardiovascular:  ?   Rate and Rhythm: Normal rate.  ?Pulmonary:  ?   Effort: Pulmonary effort is normal.  ?Skin: ?   General: Skin is warm and dry.  ?   Findings: No erythema or rash.  ?Neurological:  ?   Mental Status: She is alert and oriented  to person, place, and time.  ?Psychiatric:     ?   Mood and Affect: Mood normal.     ?   Behavior: Behavior normal.     ?   Thought Content: Thought content normal.  ?   Comments: Well groomed, good eye contact, normal speech and thoughts  ? ?Results for orders placed or performed in visit on 08/20/21  ?TSH  ?Result Value Ref Range  ? TSH 1.21 0.40 - 4.50 mIU/L  ?VITAMIN D 25 Hydroxy (Vit-D Deficiency, Fractures)  ?Result Value Ref Range  ? Vit D, 25-Hydroxy 41 30 - 100 ng/mL  ?Vitamin B12  ?Result Value Ref Range  ? Vitamin B-12 1,008 200 - 1,100 pg/mL  ?Hemoglobin A1c  ?Result Value Ref Range  ? Hgb A1c MFr Bld 5.4 <5.7 % of total Hgb  ? Mean Plasma Glucose 108 mg/dL  ? eAG (mmol/L) 6.0 mmol/L  ?CBC with Differential/Platelet  ?Result Value Ref Range  ? WBC 4.3 3.8 - 10.8 Thousand/uL  ? RBC 4.17 3.80 - 5.10 Million/uL  ?  Hemoglobin 12.7 11.7 - 15.5 g/dL  ? HCT 38.6 35.0 - 45.0 %  ? MCV 92.6 80.0 - 100.0 fL  ? MCH 30.5 27.0 - 33.0 pg  ? MCHC 32.9 32.0 - 36.0 g/dL  ? RDW 12.8 11.0 - 15.0 %  ? Platelets 175 140 - 400 Thousand/uL  ? MPV 12.6 (H) 7.5 - 12.5 fL  ? Neutro Abs 2,335 1,500 - 7,800 cells/uL  ? Lymphs Abs 1,449 850 - 3,900 cells/uL  ? Absolute Monocytes 348 200 - 950 cells/uL  ? Eosinophils Absolute 129 15 - 500 cells/uL  ? Basophils Absolute 39 0 - 200 cells/uL  ? Neutrophils Relative % 54.3 %  ? Total Lymphocyte 33.7 %  ? Monocytes Relative 8.1 %  ? Eosinophils Relative 3.0 %  ? Basophils Relative 0.9 %  ?Lipid panel  ?Result Value Ref Range  ? Cholesterol 238 (H) <200 mg/dL  ? HDL 69 > OR = 50 mg/dL  ? Triglycerides 78 <150 mg/dL  ? LDL Cholesterol (Calc) 151 (H) mg/dL (calc)  ? Total CHOL/HDL Ratio 3.4 <5.0 (calc)  ? Non-HDL Cholesterol (Calc) 169 (H) <130 mg/dL (calc)  ?COMPLETE METABOLIC PANEL WITH GFR  ?Result Value Ref Range  ? Glucose, Bld 84 65 - 99 mg/dL  ? BUN 16 7 - 25 mg/dL  ? Creat 0.77 0.60 - 1.00 mg/dL  ? eGFR 80 > OR = 60 mL/min/1.29m  ? BUN/Creatinine Ratio NOT APPLICABLE 6 - 22 (calc)  ?  Sodium 140 135 - 146 mmol/L  ? Potassium 4.2 3.5 - 5.3 mmol/L  ? Chloride 106 98 - 110 mmol/L  ? CO2 25 20 - 32 mmol/L  ? Calcium 9.3 8.6 - 10.4 mg/dL  ? Total Protein 7.3 6.1 - 8.1 g/dL  ? Albumin 3.6 3.6 - 5.1 g/dL  ? Globulin 3.7 1.9 - 3.7 g/dL (calc)  ? AG Ratio 1.0 1.0 - 2.5 (calc)  ? Total Bilirubin 0.5 0.2 - 1.2 mg/dL  ? Alkaline phosphatase (APISO) 307 (H) 37 - 153 U/L  ? AST 45 (H) 10 - 35 U/L  ? ALT 35 (H) 6 - 29 U/L  ? ?   ?Assessment & Plan:  ? ?Problem List Items Addressed This Visit   ?None ?Visit Diagnoses   ? ? Acute cystitis without hematuria    -  Primary  ? Relevant Medications  ? cephALEXin (KEFLEX) 500 MG capsule  ? Dysuria      ? ?  ?  ?Clinically consistent with UTI and confirmed on UA. No recent UTIs or abx courses. ?No concern for pyelo today (no systemic symptoms, neg fever, back pain, n/v). ? ?Plan: ?Keflex 5064mTID x 7 days ?Improve PO hydration ?RTC if no improvement 1-2 weeks, red flags given to return sooner ? ?Patient was added on today work in, did not leave urine sample ? ? ?Meds ordered this encounter  ?Medications  ? cephALEXin (KEFLEX) 500 MG capsule  ?  Sig: Take 1 capsule (500 mg total) by mouth 3 (three) times daily. For 7 days  ?  Dispense:  21 capsule  ?  Refill:  0  ? ? ? ? ?Follow up plan: ?Return if symptoms worsen or fail to improve. ? ?AlNobie PutnamDO ?[facility] ?09/24/2021, 5:02 PM ?

## 2021-09-24 NOTE — Patient Instructions (Addendum)
   Please schedule a Follow-up Appointment to: Return if symptoms worsen or fail to improve.  If you have any other questions or concerns, please feel free to call the office or send a message through MyChart. You may also schedule an earlier appointment if necessary.  Additionally, you may be receiving a survey about your experience at our office within a few days to 1 week by e-mail or mail. We value your feedback.  Jhene Westmoreland, DO [facility], [facility] 

## 2021-10-16 ENCOUNTER — Ambulatory Visit: Payer: Self-pay | Admitting: *Deleted

## 2021-10-16 DIAGNOSIS — H1033 Unspecified acute conjunctivitis, bilateral: Secondary | ICD-10-CM

## 2021-10-16 MED ORDER — POLYMYXIN B-TRIMETHOPRIM 10000-0.1 UNIT/ML-% OP SOLN: 1.0000 [drp] | Freq: Four times a day (QID) | OPHTHALMIC | 0 refills | Status: DC

## 2021-10-16 NOTE — Telephone Encounter (Signed)
Pt's granddaughter called and reported that the patient has been experiencing possible pink eye, some swelling/drainage.   Best contact: [phone] Verlan Friends)      Chief Complaint: Eyes red, itchy Symptoms: Crusty drainage from both eyes, sclera red, mild swelling of eyelids, painful at times "When wiping eyes."  Frequency: Onset Saturday Pertinent Negatives: Patient denies  Disposition: '[]'$ ED /'[]'$ Urgent Care (no appt availability in office) / '[]'$ Appointment(In office/virtual)/ '[]'$  [facility]/ '[]'$ Home Care/ '[]'$ Refused Recommended Disposition /'[]'$ [facility]/ '[x]'$  Follow-up with PCP Additional Notes:  States her daughter and granddaughter both had pink eye, since resolved. Pt states she is using granddaughters antibiotic drops, started yesterday,"Little better."  No availability, cannot do [facility]. Did give home care advise, assured pt NT would route to practice for PCPs advise and final disposition.  Please advise.  Reason for Disposition  [1] Eye with yellow or green discharge, or eyelashes stick together AND [2] PCP standing order to call in antibiotic eye drops  Answer Assessment - Initial Assessment Questions 1. EYE DISCHARGE: "Is the discharge in one or both eyes?" "What color is it?" "How much is there?" "When did the discharge start?"      Both, clear,crusty in morning. Onset Saturday 2. REDNESS OF SCLERA: "Is the redness in one or both eyes?" "When did the redness start?"      Yes, both eyes 3. EYELIDS: "Are the eyelids red or swollen?" If Yes, ask: "How much?"      Mild swelling 4. VISION: "Is there any difficulty seeing clearly?"      A little bit 5. PAIN: "Is there any pain? If Yes, ask: "How bad is it?" (Scale 1-10; or mild, moderate, severe)    - MILD (1-3): doesn't interfere with normal activities     - MODERATE (4-7): interferes with normal activities or awakens from sleep    - SEVERE (8-10): excruciating pain, unable to do any normal  activities       At times painful when wiping eyes 6. CONTACT LENS: "Do you wear contacts?"     no 7. OTHER SYMPTOMS: "Do you have any other symptoms?" (e.g., fever, runny nose, cough)     Runny nose, mild cough, mostly dry, clear at times  Protocols used: Eye - Pus or Discharge-A-AH

## 2021-10-16 NOTE — Telephone Encounter (Signed)
Okay I am not sure how best to handle this request.  Typically I would need some form for visit in order to prescribe.  Office visit, virtual visit phone or video.  She can even send a [employer] as those are billable encounters now, we can order a med on mychart and I can give her counseling advice.  But sounds like phone call just requesting med.  I will order it this time if she is unavailable. Future will need one of the above.  Rx antibiotic eye drops sent to [facility].  Thanks  Nobie Putnam, DO [facility] 10/16/2021, 1:11 PM

## 2021-10-19 ENCOUNTER — Encounter: Payer: Self-pay | Admitting: Family Medicine

## 2021-10-19 ENCOUNTER — Ambulatory Visit (INDEPENDENT_AMBULATORY_CARE_PROVIDER_SITE_OTHER): Payer: Medicare Other | Admitting: Family Medicine

## 2021-10-19 VITALS — BP 122/62 | HR 66 | Ht 63.5 in | Wt 166.2 lb

## 2021-10-19 DIAGNOSIS — H1033 Unspecified acute conjunctivitis, bilateral: Secondary | ICD-10-CM | POA: Diagnosis not present

## 2021-10-19 NOTE — Patient Instructions (Addendum)
Thank you for coming to the office today.  Use the antibiotic polytrim eye drops every 4-6 hours or about 4 times per day both eyes for 7 to 10 days  Can still use gentle warm compresses.  Bacterial Conjunctivitis, Adult Bacterial conjunctivitis is an infection of the clear membrane that covers the white part of the eye and the inner surface of the eyelid (conjunctiva). When the blood vessels in the conjunctiva become inflamed, the eye becomes red or pink. The eye often feels irritated or itchy. Bacterial conjunctivitis spreads easily from person to person (is contagious). It also spreads easily from one eye to the other eye. What are the causes? This condition is caused by bacteria. You may get the infection if you come into close contact with: A person who is infected with the bacteria. Items that are contaminated with the bacteria, such as a face towel, contact lens solution, or eye makeup. What increases the risk? You are more likely to develop this condition if: You are exposed to other people who have the infection. You wear contact lenses. You have a sinus infection. You have had a recent eye injury or surgery. You have a weak body defense system (immune system). You have a medical condition that causes dry eyes. What are the signs or symptoms? Symptoms of this condition include: Thick, yellowish discharge from the eye. This may turn into a crust on the eyelid overnight and cause your eyelids to stick together. Tearing or watery eyes. Itchy eyes. Burning feeling in your eyes. Eye redness. Swollen eyelids. Blurred vision. How is this diagnosed? This condition is diagnosed based on your symptoms and medical history. Your health care provider may also take a sample of discharge from your eye to find the cause of your infection. How is this treated? This condition may be treated with: Antibiotic eye drops or ointment to clear the infection more quickly and prevent the spread of  infection to others. Antibiotic medicines taken by mouth (orally) to treat infections that do not respond to drops or ointments or that last longer than 10 days. Cool, wet cloths (cool compresses) placed on the eyes. Artificial tears applied 2-6 times a day. Follow these instructions at home: Medicines Take or apply your antibiotic medicine as told by your health care provider. Do not stop using the antibiotic, even if your condition improves, unless directed by your health care provider. Take or apply over-the-counter and prescription medicines only as told by your health care provider. Be very careful to avoid touching the edge of your eyelid with the eye-drop bottle or the ointment tube when you apply medicines to the affected eye. This will keep you from spreading the infection to your other eye or to other people. Managing discomfort Gently wipe away any drainage from your eye with a warm, wet washcloth or a cotton ball. Apply a clean, cool compress to your eye for 10-20 minutes, 3-4 times a day. General instructions Do not wear contact lenses until the inflammation is gone and your health care provider says it is safe to wear them again. Ask your health care provider how to sterilize or replace your contact lenses before you use them again. Wear glasses until you can resume wearing contact lenses. Avoid wearing eye makeup until the inflammation is gone. Throw away any old eye cosmetics that may be contaminated. Change or wash your pillowcase every day. Do not share towels or washcloths. This may spread the infection. Wash your hands often with soap and water  for at least 20 seconds and especially before touching your face or eyes. Use paper towels to dry your hands. Avoid touching or rubbing your eyes. Do not drive or use heavy machinery if your vision is blurred. Contact a health care provider if: You have a fever. Your symptoms do not get better after 10 days. Get help right away  if: You have a fever and your symptoms suddenly get worse. You have severe pain when you move your eye. You have facial pain, redness, or swelling. You have a sudden loss of vision. Summary Bacterial conjunctivitis is an infection of the clear membrane that covers the white part of the eye and the inner surface of the eyelid (conjunctiva). Bacterial conjunctivitis spreads easily from eye to eye and from person to person (is contagious). Wash your hands often with soap and water for at least 20 seconds and especially before touching your face or eyes. Use paper towels to dry your hands. Take or apply your antibiotic medicine as told by your health care provider. Do not stop using the antibiotic even if your condition improves. Contact a health care provider if you have a fever or if your symptoms do not get better after 10 days. Get help right away if you have a sudden loss of vision. This information is not intended to replace advice given to you by your health care provider. Make sure you discuss any questions you have with your health care provider. Document Revised: 08/16/2020 Document Reviewed: 08/16/2020 Elsevier Patient Education  [place].     Please schedule a Follow-up Appointment to: Return if symptoms worsen or fail to improve.  If you have any other questions or concerns, please feel free to call the office or send a message through [facility]. You may also schedule an earlier appointment if necessary.  Additionally, you may be receiving a survey about your experience at our office within a few days to 1 week by e-mail or mail. We value your feedback.  Nobie Putnam, DO [facility]

## 2021-10-19 NOTE — Progress Notes (Signed)
Subjective:    Patient ID: Vanessa Reynolds, female    DOB: 26-May-1945, 75 y.o.   MRN: [id number]  Vanessa Reynolds is a 76 y.o. female presenting on 10/19/2021 for Conjunctivitis   HPI  Conjunctivitis, bilateral Reports onset last weekend for few days then worsening drainage, redness of eye, sick contact with granddaughter with pink eye, she was given polytrim antibiotic eye drops by family member some improvement. She called [place] this week, order was sent for polytrim on 5/30 but she was not notified and did not pick up yet. Denies cough congestion drainage headache vision loss      08/27/2021    8:18 AM 04/17/2020   10:51 AM 01/11/2020    1:21 PM  Depression screen PHQ 2/9  Decreased Interest 0 0 0  Down, Depressed, Hopeless 0 0 0  PHQ - 2 Score 0 0 0    Social History   Tobacco Use   Smoking status: Former    Packs/day: 2.00    Years: 30.00    Pack years: 60.00    Types: Cigarettes    Quit date: 05/20/1997    Years since quitting: 24.4   Smokeless tobacco: Former  Scientific laboratory technician Use: Never used  Substance Use Topics   Alcohol use: No   Drug use: No    Review of Systems Per HPI unless specifically indicated above     Objective:    BP 122/62   Pulse 66   Ht 5' 3.5" (1.613 m)   Wt 166 lb 3.2 oz (75.4 kg)   SpO2 99%   BMI 28.98 kg/m   Wt Readings from Last 3 Encounters:  10/19/21 166 lb 3.2 oz (75.4 kg)  09/24/21 168 lb (76.2 kg)  08/27/21 168 lb (76.2 kg)    Physical Exam Vitals and nursing note reviewed.  Constitutional:      General: She is not in acute distress.    Appearance: Normal appearance. She is well-developed. She is not diaphoretic.     Comments: Well-appearing, comfortable, cooperative  HENT:     Head: Normocephalic and atraumatic.  Eyes:     General:        Right eye: No discharge.        Left eye: No discharge.     Extraocular Movements: Extraocular movements intact.     Pupils: Pupils are equal, round, and reactive to light.      Comments: Crusty drainage Bilateral conjunctiva with injection redness both sides L>R  Cardiovascular:     Rate and Rhythm: Normal rate.  Pulmonary:     Effort: Pulmonary effort is normal.  Skin:    General: Skin is warm and dry.     Findings: No erythema or rash.  Neurological:     Mental Status: She is alert and oriented to person, place, and time.  Psychiatric:        Mood and Affect: Mood normal.        Behavior: Behavior normal.        Thought Content: Thought content normal.     Comments: Well groomed, good eye contact, normal speech and thoughts   Results for orders placed or performed in visit on 08/20/21  TSH  Result Value Ref Range   TSH 1.21 0.40 - 4.50 mIU/L  VITAMIN D 25 Hydroxy (Vit-D Deficiency, Fractures)  Result Value Ref Range   Vit D, 25-Hydroxy 41 30 - 100 ng/mL  Vitamin B12  Result Value Ref Range   Vitamin  B-12 1,008 200 - 1,100 pg/mL  Hemoglobin A1c  Result Value Ref Range   Hgb A1c MFr Bld 5.4 <5.7 % of total Hgb   Mean Plasma Glucose 108 mg/dL   eAG (mmol/L) 6.0 mmol/L  CBC with Differential/Platelet  Result Value Ref Range   WBC 4.3 3.8 - 10.8 Thousand/uL   RBC 4.17 3.80 - 5.10 Million/uL   Hemoglobin 12.7 11.7 - 15.5 g/dL   HCT 38.6 35.0 - 45.0 %   MCV 92.6 80.0 - 100.0 fL   MCH 30.5 27.0 - 33.0 pg   MCHC 32.9 32.0 - 36.0 g/dL   RDW 12.8 11.0 - 15.0 %   Platelets 175 140 - 400 Thousand/uL   MPV 12.6 (H) 7.5 - 12.5 fL   Neutro Abs 2,335 1,500 - 7,800 cells/uL   Lymphs Abs 1,449 850 - 3,900 cells/uL   Absolute Monocytes 348 200 - 950 cells/uL   Eosinophils Absolute 129 15 - 500 cells/uL   Basophils Absolute 39 0 - 200 cells/uL   Neutrophils Relative % 54.3 %   Total Lymphocyte 33.7 %   Monocytes Relative 8.1 %   Eosinophils Relative 3.0 %   Basophils Relative 0.9 %  Lipid panel  Result Value Ref Range   Cholesterol 238 (H) <200 mg/dL   HDL 69 > OR = 50 mg/dL   Triglycerides 78 <150 mg/dL   LDL Cholesterol (Calc) 151 (H) mg/dL (calc)    Total CHOL/HDL Ratio 3.4 <5.0 (calc)   Non-HDL Cholesterol (Calc) 169 (H) <130 mg/dL (calc)  COMPLETE METABOLIC PANEL WITH GFR  Result Value Ref Range   Glucose, Bld 84 65 - 99 mg/dL   BUN 16 7 - 25 mg/dL   Creat 0.77 0.60 - 1.00 mg/dL   eGFR 80 > OR = 60 mL/min/1.73m   BUN/Creatinine Ratio NOT APPLICABLE 6 - 22 (calc)   Sodium 140 135 - 146 mmol/L   Potassium 4.2 3.5 - 5.3 mmol/L   Chloride 106 98 - 110 mmol/L   CO2 25 20 - 32 mmol/L   Calcium 9.3 8.6 - 10.4 mg/dL   Total Protein 7.3 6.1 - 8.1 g/dL   Albumin 3.6 3.6 - 5.1 g/dL   Globulin 3.7 1.9 - 3.7 g/dL (calc)   AG Ratio 1.0 1.0 - 2.5 (calc)   Total Bilirubin 0.5 0.2 - 1.2 mg/dL   Alkaline phosphatase (APISO) 307 (H) 37 - 153 U/L   AST 45 (H) 10 - 35 U/L   ALT 35 (H) 6 - 29 U/L      Assessment & Plan:   Problem List Items Addressed This Visit   None Visit Diagnoses     Acute conjunctivitis of both eyes, unspecified acute conjunctivitis type    -  Primary       Acute bilateral conjunctivitis for past 5 days, with mild to moderate scleral/conjunctival injection with thicker crusty discharge bilateral. Sick contact w/ pink eye resolving on antibiotic drops now She started med but needs her own order, did not pick up yet from pharmacy on 5/30 - Normal visual acuity in office - No evidence of complication, foreign body, or extending eyelid / pre-septal cellulitis   Plan: 1. Reassurance, most likely self limited, empiric coverage with Start Polytrim antibiotic eye drops 1 drop in both eye every 4-6 hours for 7-10 days - already ordered, she can pick up at pharmacy 2. Start moist warm compresses over Left and Right eyelid 10-15 min at a time, up to 4-6 times daily until resolution  3. Frequent hand washing, avoid rubbing / scratching eye 4. Strict return precautions for spreading infection 5. Follow-up 1-2 weeks as needed    No orders of the defined types were placed in this encounter.     Follow up  plan: Return if symptoms worsen or fail to improve.   Nobie Putnam, DO [facility] 10/19/2021, 10:33 AM

## 2021-10-23 ENCOUNTER — Telehealth: Payer: Self-pay | Admitting: Cardiology

## 2021-10-23 NOTE — Telephone Encounter (Signed)
We have attempted to schedule several times with no success Deleting recall

## 2022-02-28 ENCOUNTER — Ambulatory Visit (INDEPENDENT_AMBULATORY_CARE_PROVIDER_SITE_OTHER): Payer: Medicare Other | Admitting: Family Medicine

## 2022-02-28 ENCOUNTER — Encounter: Payer: Self-pay | Admitting: Family Medicine

## 2022-02-28 VITALS — BP 140/76 | HR 66 | Ht 63.5 in | Wt 165.8 lb

## 2022-02-28 DIAGNOSIS — E782 Mixed hyperlipidemia: Secondary | ICD-10-CM

## 2022-02-28 DIAGNOSIS — I700 Atherosclerosis of aorta: Secondary | ICD-10-CM | POA: Diagnosis not present

## 2022-02-28 DIAGNOSIS — E538 Deficiency of other specified B group vitamins: Secondary | ICD-10-CM | POA: Diagnosis not present

## 2022-02-28 DIAGNOSIS — Z23 Encounter for immunization: Secondary | ICD-10-CM | POA: Diagnosis not present

## 2022-02-28 DIAGNOSIS — I7300 Raynaud's syndrome without gangrene: Secondary | ICD-10-CM

## 2022-02-28 DIAGNOSIS — R7309 Other abnormal glucose: Secondary | ICD-10-CM

## 2022-02-28 DIAGNOSIS — E559 Vitamin D deficiency, unspecified: Secondary | ICD-10-CM | POA: Diagnosis not present

## 2022-02-28 DIAGNOSIS — I10 Essential (primary) hypertension: Secondary | ICD-10-CM

## 2022-02-28 DIAGNOSIS — R309 Painful micturition, unspecified: Secondary | ICD-10-CM | POA: Diagnosis not present

## 2022-02-28 DIAGNOSIS — D329 Benign neoplasm of meninges, unspecified: Secondary | ICD-10-CM | POA: Diagnosis not present

## 2022-02-28 DIAGNOSIS — N3001 Acute cystitis with hematuria: Secondary | ICD-10-CM

## 2022-02-28 LAB — POCT URINALYSIS DIPSTICK
Bilirubin, UA: NEGATIVE
Glucose, UA: NEGATIVE
Ketones, UA: NEGATIVE
Nitrite, UA: POSITIVE
Protein, UA: NEGATIVE
Spec Grav, UA: 1.015 (ref 1.010–1.025)
Urobilinogen, UA: 0.2 E.U./dL
pH, UA: 6.5 (ref 5.0–8.0)

## 2022-02-28 MED ORDER — CEPHALEXIN 500 MG PO CAPS: 500.0000 mg | ORAL_CAPSULE | Freq: Three times a day (TID) | ORAL | 0 refills | Status: DC

## 2022-02-28 NOTE — Progress Notes (Signed)
Subjective:    Patient ID: Vanessa Reynolds, female    DOB: 13-Jan-1946, 76 y.o.   MRN: [id number]  Vanessa Reynolds is a 76 y.o. female presenting on 02/28/2022 for Hypertension and painful urination   HPI  UTI Onset 1 week, dysuria intermittently Last UTI 09/2021, treated with Keflex  Vitamin B12 Vitamin D deficiency Check labs, she does take supplements  Elevated BP  CHRONIC HTN Hyperlipidemia History of possible TIA Meningioma   Followed by [facility] Neurosurgery Dr Izora Ribas for Meningioma surveillance. No further imaging repeat unless interested in 2 years She declines repeat imaging   Followed by Dr Delana Meyer, Vascular   Elevated BP now recently. Home Bp not checking as often but she can monitor it more. Admits salt and stress can raise BP  She is taking Lisinopril '40mg'$  daily.   Reports good compliance, took meds today. Tolerating well, w/o complaints. Denies CP, dyspnea, HA, edema / lightheadedness    Atherosclerosis Aorta On imaging CT 2021, see below On statin therapy   Vitamin B12 Deficiency Completed injections On Oral B12 100   Health Maintenance:   Flu Shot     02/28/2022   10:14 AM 08/27/2021    8:18 AM 04/17/2020   10:51 AM  Depression screen PHQ 2/9  Decreased Interest 0 0 0  Down, Depressed, Hopeless 0 0 0  PHQ - 2 Score 0 0 0  Altered sleeping 0    Tired, decreased energy 0    Change in appetite 0    Feeling bad or failure about yourself  0    Trouble concentrating 0    Moving slowly or fidgety/restless 0    Suicidal thoughts 0    PHQ-9 Score 0    Difficult doing work/chores Not difficult at all      Past Medical History:  Diagnosis Date   Abdominal pain, left lower quadrant 2012   Allergy    Breast screening, unspecified    H/O cystitis 2011   History of colon cancer    Hyperlipidemia    Hypertension    Nausea with vomiting    Personal history of malignant neoplasm of large intestine 2013   Personal history of tobacco use,  presenting hazards to health    Rectal cancer ([place]) 04/2010   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   Rectal cancer [facility])    Past Surgical History:  Procedure Laterality Date   APPENDECTOMY  December 2011   BILATERAL OOPHORECTOMY  December 2011   Completed at time of low anterior resection.   COLON SURGERY  03/14/10   lap assisted low anterior resection-adenocarcinoma of the rectum and a villous adenoma   COLONOSCOPY  2011,2012,2015   Dr. Vira Agar, Dr. Addison Lank)   COLONOSCOPY WITH PROPOFOL N/A 01/05/2020   Procedure: COLONOSCOPY WITH PROPOFOL;  Surgeon: Robert Bellow, MD;  Location: [facility] ENDOSCOPY;  Service: Endoscopy;  Laterality: N/A;   Social History   Socioeconomic History   Marital status: Married    Spouse name: Jaylenne Hamelin   Number of children: 3   Years of education: Not on file   Highest education level: 10th grade  Occupational History    Comment: fulltime    Occupation: Retired  Tobacco Use   Smoking status: Former    Packs/day: 2.00    Years: 30.00    Total pack years: 60.00    Types: Cigarettes    Quit date: 05/20/1997    Years since quitting: 24.7   Smokeless tobacco: Former  Vaping Use   Vaping Use: Never used  Substance and Sexual Activity   Alcohol use: No   Drug use: No   Sexual activity: Not on file  Other Topics Concern   Not on file  Social History Narrative   Not on file   Social Determinants of Health   Financial Resource Strain: Low Risk  (08/27/2021)   Overall Financial Resource Strain (CARDIA)    Difficulty of Paying Living Expenses: Not hard at all  Food Insecurity: No Food Insecurity (08/27/2021)   Hunger Vital Sign    Worried About Running Out of Food in the Last Year: Never true    Ran Out of Food in the Last Year: Never true  Transportation Needs: No Transportation Needs (08/27/2021)   PRAPARE - Hydrologist (Medical): No    Lack of Transportation (Non-Medical): No  Physical  Activity: Inactive (08/27/2021)   Exercise Vital Sign    Days of Exercise per Week: 0 days    Minutes of Exercise per Session: 0 min  Stress: No Stress Concern Present (08/27/2021)   [place]    Feeling of Stress : Not at all  Social Connections: [place] (08/27/2021)   Social Connection and Isolation Panel [NHANES]    Frequency of Communication with Friends and Family: More than three times a week    Frequency of Social Gatherings with Friends and Family: Not on file    Attends Religious Services: More than 4 times per year    Active Member of [employer] or Organizations: Yes    Attends Archivist Meetings: More than 4 times per year    Marital Status: Married  Human resources officer Violence: Not At Risk (07/01/2017)   Humiliation, Afraid, Rape, and Kick questionnaire    Fear of Current or Ex-Partner: No    Emotionally Abused: No    Physically Abused: No    Sexually Abused: No   Family History  Problem Relation Age of Onset   Hypertension Mother        deceased, age 20   Cancer Father        gastric cancer, deceased, mid 72's   Breast cancer Neg Hx    Current Outpatient Medications on File Prior to Visit  Medication Sig   Apple Cider Vinegar 188 MG CAPS Take by mouth.   fluticasone (FLONASE) 50 MCG/ACT nasal spray PLACE 2 SPRAYS INTO BOTH NOSTRILS DAILY.USE FOR 4-6 WEEKS THEN STOP AND USE SEASONALLY OR AS NEEDED   lisinopril (ZESTRIL) 40 MG tablet Take 1 tablet (40 mg total) by mouth at bedtime.   loratadine (CLARITIN) 10 MG tablet Take 1 tablet (10 mg total) by mouth daily. Use for 4-6 weeks then stop, and use as needed or seasonally   lovastatin (MEVACOR) 20 MG tablet Take 1 tablet (20 mg total) by mouth at bedtime.   omeprazole (PRILOSEC) 20 MG capsule Take 1 capsule (20 mg total) by mouth daily before breakfast.   trimethoprim-polymyxin b (POLYTRIM) ophthalmic solution Place 1 drop into both eyes  every 6 (six) hours. For 7-10 days   vitamin B-12 (CYANOCOBALAMIN) 100 MCG tablet Take 100 mcg by mouth daily.   No current facility-administered medications on file prior to visit.    Review of Systems  Constitutional:  Negative for activity change, appetite change, chills, diaphoresis, fatigue and fever.  HENT:  Negative for congestion and hearing loss.   Eyes:  Negative for visual disturbance.  Respiratory:  Negative for cough, chest tightness, shortness of breath and wheezing.   Cardiovascular:  Negative for chest pain, palpitations and leg swelling.  Gastrointestinal:  Negative for abdominal pain, constipation, diarrhea, nausea and vomiting.  Genitourinary:  Negative for dysuria, frequency and hematuria.  Musculoskeletal:  Negative for arthralgias and neck pain.  Skin:  Negative for rash.  Neurological:  Negative for dizziness, weakness, light-headedness, numbness and headaches.  Hematological:  Negative for adenopathy.  Psychiatric/Behavioral:  Negative for behavioral problems, dysphoric mood and sleep disturbance.    Per HPI unless specifically indicated above      Objective:    BP (!) 140/76 (BP Location: Right Arm, Cuff Size: Normal)   Pulse 66   Ht 5' 3.5" (1.613 m)   Wt 165 lb 12.8 oz (75.2 kg)   SpO2 100%   BMI 28.91 kg/m   Wt Readings from Last 3 Encounters:  02/28/22 165 lb 12.8 oz (75.2 kg)  10/19/21 166 lb 3.2 oz (75.4 kg)  09/24/21 168 lb (76.2 kg)    Physical Exam Vitals and nursing note reviewed.  Constitutional:      General: She is not in acute distress.    Appearance: She is well-developed. She is not diaphoretic.     Comments: Well-appearing, comfortable, cooperative  HENT:     Head: Normocephalic and atraumatic.  Eyes:     General:        Right eye: No discharge.        Left eye: No discharge.     Conjunctiva/sclera: Conjunctivae normal.     Pupils: Pupils are equal, round, and reactive to light.  Neck:     Thyroid: No thyromegaly.   Cardiovascular:     Rate and Rhythm: Normal rate and regular rhythm.     Pulses: Normal pulses.     Heart sounds: Normal heart sounds. No murmur heard. Pulmonary:     Effort: Pulmonary effort is normal. No respiratory distress.     Breath sounds: Normal breath sounds. No wheezing or rales.  Abdominal:     General: Bowel sounds are normal. There is no distension.     Palpations: Abdomen is soft. There is no mass.     Tenderness: There is no abdominal tenderness.  Musculoskeletal:        General: No tenderness. Normal range of motion.     Cervical back: Normal range of motion and neck supple.     Comments: Upper / Lower Extremities: - Normal muscle tone, strength bilateral upper extremities 5/5, lower extremities 5/5  Lymphadenopathy:     Cervical: No cervical adenopathy.  Skin:    General: Skin is warm and dry.     Findings: No erythema or rash.  Neurological:     Mental Status: She is alert and oriented to person, place, and time.     Comments: Distal sensation intact to light touch all extremities  Psychiatric:        Mood and Affect: Mood normal.        Behavior: Behavior normal.        Thought Content: Thought content normal.     Comments: Well groomed, good eye contact, normal speech and thoughts      Results for orders placed or performed in visit on 02/28/22  CBC with Differential/Platelet  Result Value Ref Range   WBC 5.8 3.8 - 10.8 Thousand/uL   RBC 4.30 3.80 - 5.10 Million/uL   Hemoglobin 13.5 11.7 - 15.5 g/dL   HCT 39.9 35.0 - 45.0 %  MCV 92.8 80.0 - 100.0 fL   MCH 31.4 27.0 - 33.0 pg   MCHC 33.8 32.0 - 36.0 g/dL   RDW 12.5 11.0 - 15.0 %   Platelets 102 (L) 140 - 400 Thousand/uL   MPV 12.5 7.5 - 12.5 fL   Neutro Abs 4,043 1,500 - 7,800 cells/uL   Lymphs Abs 1,311 850 - 3,900 cells/uL   Absolute Monocytes 273 200 - 950 cells/uL   Eosinophils Absolute 122 15 - 500 cells/uL   Basophils Absolute 52 0 - 200 cells/uL   Neutrophils Relative % 69.7 %   Total  Lymphocyte 22.6 %   Monocytes Relative 4.7 %   Eosinophils Relative 2.1 %   Basophils Relative 0.9 %  POCT Urinalysis Dipstick  Result Value Ref Range   Color, UA Yellow    Clarity, UA Cloudy    Glucose, UA Negative Negative   Bilirubin, UA Negative    Ketones, UA Negative    Spec Grav, UA 1.015 1.010 - 1.025   Blood, UA Moderate ++    pH, UA 6.5 5.0 - 8.0   Protein, UA Negative Negative   Urobilinogen, UA 0.2 0.2 or 1.0 E.U./dL   Nitrite, UA Positive    Leukocytes, UA Moderate (2+) (A) Negative   Appearance     Odor        Assessment & Plan:   Problem List Items Addressed This Visit     Atherosclerosis of aorta (HCC)   Essential hypertension - Primary   Relevant Orders   COMPLETE METABOLIC PANEL WITH GFR   CBC with Differential/Platelet (Completed)   Hyperlipidemia   Relevant Orders   COMPLETE METABOLIC PANEL WITH GFR   Lipid panel   TSH   Meningioma (HCC)   Other Visit Diagnoses     Acute cystitis with hematuria       Relevant Medications   cephALEXin (KEFLEX) 500 MG capsule   Other Relevant Orders   POCT Urinalysis Dipstick (Completed)   Urine Culture   Needs flu shot       Relevant Orders   Flu Vaccine QUAD High Dose(Fluad) (Completed)   Vitamin B12 deficiency       Relevant Orders   Vitamin B12   Vitamin D deficiency       Relevant Orders   VITAMIN D 25 Hydroxy (Vit-D Deficiency, Fractures)   Abnormal glucose       Relevant Orders   Hemoglobin A1c   Raynaud's phenomenon without gangrene           Updated Health Maintenance information Reviewed recent lab results with patient Encouraged improvement to lifestyle with diet and exercise Goal of weight loss  UTI Treat with Keflex Urine culture  HTN Improved on repeat check Continue current therapy  Refills as needed.  Meningioma Followed by Charleen Kirks, on surveillance now next 2 years     Meds ordered this encounter  Medications   cephALEXin (KEFLEX) 500 MG capsule    Sig: Take 1  capsule (500 mg total) by mouth 3 (three) times daily. For 7 days    Dispense:  21 capsule    Refill:  0      Follow up plan: Return in about 6 months (around 08/30/2022) for 6 month follow-up.  Nobie Putnam, [facility] 02/28/2022, 9:28 AM

## 2022-02-28 NOTE — Patient Instructions (Addendum)
Thank you for coming to the office today.  BLood today  Urine tract infection - start Keflex.  BP improved  Keep current medications, will submit refills as needed.    Please schedule a Follow-up Appointment to: Return in about 6 months (around 08/30/2022) for 6 month follow-up.  If you have any other questions or concerns, please feel free to call the office or send a message through [facility]. You may also schedule an earlier appointment if necessary.  Additionally, you may be receiving a survey about your experience at our office within a few days to 1 week by e-mail or mail. We value your feedback.  Nobie Putnam, DO [place]

## 2022-03-03 LAB — CBC WITH DIFFERENTIAL/PLATELET
Absolute Monocytes: 273 cells/uL (ref 200–950)
Basophils Absolute: 52 cells/uL (ref 0–200)
Basophils Relative: 0.9 %
Eosinophils Absolute: 122 cells/uL (ref 15–500)
Eosinophils Relative: 2.1 %
HCT: 39.9 % (ref 35.0–45.0)
Hemoglobin: 13.5 g/dL (ref 11.7–15.5)
Lymphs Abs: 1311 cells/uL (ref 850–3900)
MCH: 31.4 pg (ref 27.0–33.0)
MCHC: 33.8 g/dL (ref 32.0–36.0)
MCV: 92.8 fL (ref 80.0–100.0)
MPV: 12.5 fL (ref 7.5–12.5)
Monocytes Relative: 4.7 %
Neutro Abs: 4043 cells/uL (ref 1500–7800)
Neutrophils Relative %: 69.7 %
Platelets: 102 10*3/uL — ABNORMAL LOW (ref 140–400)
RBC: 4.3 10*6/uL (ref 3.80–5.10)
RDW: 12.5 % (ref 11.0–15.0)
Total Lymphocyte: 22.6 %
WBC: 5.8 10*3/uL (ref 3.8–10.8)

## 2022-03-03 LAB — COMPLETE METABOLIC PANEL WITH GFR
AG Ratio: 1 (calc) (ref 1.0–2.5)
ALT: 33 U/L — ABNORMAL HIGH (ref 6–29)
AST: 36 U/L — ABNORMAL HIGH (ref 10–35)
Albumin: 3.9 g/dL (ref 3.6–5.1)
Alkaline phosphatase (APISO): 316 U/L — ABNORMAL HIGH (ref 37–153)
BUN/Creatinine Ratio: 14 (calc) (ref 6–22)
BUN: 15 mg/dL (ref 7–25)
CO2: 26 mmol/L (ref 20–32)
Calcium: 9.5 mg/dL (ref 8.6–10.4)
Chloride: 104 mmol/L (ref 98–110)
Creat: 1.05 mg/dL — ABNORMAL HIGH (ref 0.60–1.00)
Globulin: 3.9 g/dL (calc) — ABNORMAL HIGH (ref 1.9–3.7)
Glucose, Bld: 86 mg/dL (ref 65–99)
Potassium: 4.2 mmol/L (ref 3.5–5.3)
Sodium: 140 mmol/L (ref 135–146)
Total Bilirubin: 0.6 mg/dL (ref 0.2–1.2)
Total Protein: 7.8 g/dL (ref 6.1–8.1)
eGFR: 55 mL/min/{1.73_m2} — ABNORMAL LOW (ref >=60)

## 2022-03-03 LAB — LIPID PANEL
Cholesterol: 217 mg/dL — ABNORMAL HIGH (ref <200)
HDL: 72 mg/dL (ref >=50)
LDL Cholesterol (Calc): 128 mg/dL (calc) — ABNORMAL HIGH
Non-HDL Cholesterol (Calc): 145 mg/dL (calc) — ABNORMAL HIGH (ref <130)
Total CHOL/HDL Ratio: 3 (calc) (ref <5.0)
Triglycerides: 74 mg/dL (ref <150)

## 2022-03-03 LAB — TSH: TSH: 1.46 mIU/L (ref 0.40–4.50)

## 2022-03-03 LAB — VITAMIN B12: Vitamin B-12: 1039 pg/mL (ref 200–1100)

## 2022-03-03 LAB — VITAMIN D 25 HYDROXY (VIT D DEFICIENCY, FRACTURES): Vit D, 25-Hydroxy: 45 ng/mL (ref 30–100)

## 2022-03-03 LAB — HEMOGLOBIN A1C
Hgb A1c MFr Bld: 5.4 % of total Hgb (ref <5.7)
Mean Plasma Glucose: 108 mg/dL
eAG (mmol/L): 6 mmol/L

## 2022-03-03 LAB — URINE CULTURE
MICRO NUMBER:: 14043314
SPECIMEN QUALITY:: ADEQUATE

## 2022-06-13 ENCOUNTER — Other Ambulatory Visit: Payer: Self-pay | Admitting: Family Medicine

## 2022-06-13 DIAGNOSIS — E7849 Other hyperlipidemia: Secondary | ICD-10-CM

## 2022-06-13 DIAGNOSIS — I10 Essential (primary) hypertension: Secondary | ICD-10-CM

## 2022-06-13 NOTE — Telephone Encounter (Signed)
Refilled 08/27/2021 #[id number] rf. Requested Prescriptions  Pending Prescriptions Disp Refills   lovastatin (MEVACOR) 20 MG tablet [Pharmacy Med Name: LOVASTATIN 20 MG TAB] 90 tablet 3    Sig: TAKE 1 TABLET BY MOUTH AT BEDTIME     Cardiovascular:  Antilipid - Statins 2 Failed - 06/13/2022 11:44 AM      Failed - Cr in normal range and within 360 days    Creat  Date Value Ref Range Status  02/28/2022 1.05 (H) 0.60 - 1.00 mg/dL Final         Failed - Lipid Panel in normal range within the last 12 months    Cholesterol, Total  Date Value Ref Range Status  02/20/2015 198 100 - 199 mg/dL Final   Cholesterol  Date Value Ref Range Status  02/28/2022 217 (H) <200 mg/dL Final   LDL Cholesterol (Calc)  Date Value Ref Range Status  02/28/2022 128 (H) mg/dL (calc) Final    Comment:    Reference range: <100 . Desirable range <100 mg/dL for primary prevention;   <70 mg/dL for patients with CHD or diabetic patients  with > or = 2 CHD risk factors. Marland Kitchen LDL-C is now calculated using the Martin-Hopkins  calculation, which is a validated novel method providing  better accuracy than the Friedewald equation in the  estimation of LDL-C.  Cresenciano Genre et al. Annamaria Helling. 6948;546(27): 2061-2068  (http://education.[facility].com/faq/FAQ164)    HDL  Date Value Ref Range Status  02/28/2022 72 > OR = 50 mg/dL Final  02/20/2015 62 >39 mg/dL Final    Comment:    According to ATP-III Guidelines, HDL-C >59 mg/dL is considered a negative risk factor for CHD.    Triglycerides  Date Value Ref Range Status  02/28/2022 74 <150 mg/dL Final         Passed - Patient is not pregnant      Passed - Valid encounter within last 12 months    Recent Outpatient Visits           3 months ago Essential hypertension   [place], DO   7 months ago Acute conjunctivitis of both eyes, unspecified acute conjunctivitis type   [facility]  [place], Devonne Doughty, DO   8 months ago Acute cystitis without hematuria   [facility] Olin Hauser, DO   9 months ago Annual physical exam   [facility] Olin Hauser, DO   10 months ago Acute non-recurrent sinusitis, unspecified location   [facility] Mecum, Erin E, PA-C               lisinopril (ZESTRIL) 40 MG tablet [Pharmacy Med Name: LISINOPRIL 40 MG TAB] 90 tablet 3    Sig: TAKE 1 TABLET BY MOUTH AT BEDTIME     Cardiovascular:  ACE Inhibitors Failed - 06/13/2022 11:44 AM      Failed - Cr in normal range and within 180 days    Creat  Date Value Ref Range Status  02/28/2022 1.05 (H) 0.60 - 1.00 mg/dL Final         Failed - Last BP in normal range    BP Readings from Last 1 Encounters:  02/28/22 (!) 140/76         Passed - K in normal range and within 180 days    Potassium  Date Value Ref Range Status  02/28/2022 4.2 3.5 -  5.3 mmol/L Final         Passed - Patient is not pregnant      Passed - Valid encounter within last 6 months    Recent Outpatient Visits           3 months ago Essential hypertension   [place], DO   7 months ago Acute conjunctivitis of both eyes, unspecified acute conjunctivitis type   [place], DO   8 months ago Acute cystitis without hematuria   [facility] Olin Hauser, DO   9 months ago Annual physical exam   [facility] Olin Hauser, DO   10 months ago Acute non-recurrent sinusitis, unspecified location   [facility] Mecum, Dani [place], [place]

## 2022-07-31 ENCOUNTER — Telehealth: Payer: Self-pay | Admitting: Family Medicine

## 2022-07-31 NOTE — Telephone Encounter (Signed)
Called patient to schedule Medicare Annual Wellness Visit (AWV). Left message for patient to call back and schedule Medicare Annual Wellness Visit (AWV).  Last date of AWV: 08/23/21  Please schedule an appointment at any time with Kirke Shaggy, LPN .  If any questions, please contact me.  Thank you ,  Sherol Dade; [place] Direct Dial: [phone]

## 2022-08-02 ENCOUNTER — Encounter: Payer: Self-pay | Admitting: Family Medicine

## 2022-08-02 ENCOUNTER — Ambulatory Visit (INDEPENDENT_AMBULATORY_CARE_PROVIDER_SITE_OTHER): Payer: 59 | Admitting: Family Medicine

## 2022-08-02 VITALS — BP 122/64 | HR 75 | Temp 97.1°F

## 2022-08-02 DIAGNOSIS — R300 Dysuria: Secondary | ICD-10-CM

## 2022-08-02 DIAGNOSIS — N3001 Acute cystitis with hematuria: Secondary | ICD-10-CM | POA: Diagnosis not present

## 2022-08-02 LAB — POCT URINALYSIS DIPSTICK
Bilirubin, UA: NEGATIVE
Glucose, UA: NEGATIVE
Ketones, UA: NEGATIVE
Nitrite, UA: NEGATIVE
Protein, UA: NEGATIVE
Spec Grav, UA: 1.015 (ref 1.010–1.025)
Urobilinogen, UA: 0.2 E.U./dL
pH, UA: 6 (ref 5.0–8.0)

## 2022-08-02 MED ORDER — CEPHALEXIN 500 MG PO CAPS: 500.0000 mg | ORAL_CAPSULE | Freq: Three times a day (TID) | ORAL | 0 refills | Status: DC

## 2022-08-02 NOTE — Patient Instructions (Addendum)
Thank you for coming to the office today.  1. You have a Urinary Tract Infection - this is very common, your symptoms are reassuring and you should get better within 1 week on the antibiotics - Start Keflex 500mg 3 times daily for next 7 days, complete entire course, even if feeling better - We sent urine for a culture, we will call you within next few days if we need to change antibiotics - Please drink plenty of fluids, improve hydration over next 1 week  If symptoms worsening, developing nausea / vomiting, worsening back pain, fevers / chills / sweats, then please return for re-evaluation sooner.  If you take AZO OTC - limit this to 2-3 days MAX to avoid affecting kidneys  D-Mannose is a natural supplement that can actually help bind to urinary bacteria and reduce their effectiveness it can help prevent UTI from forming, and may reduce some symptoms. It likely cannot cure an active UTI but it is worth a try and good to prevent them with. Try 500mg twice a day at a full dose if you want, or check package instructions for more info   Please schedule a Follow-up Appointment to: Return if symptoms worsen or fail to improve.  If you have any other questions or concerns, please feel free to call the office or send a message through MyChart. You may also schedule an earlier appointment if necessary.  Additionally, you may be receiving a survey about your experience at our office within a few days to 1 week by e-mail or mail. We value your feedback.  Sinia Antosh, DO [facility], [facility] 

## 2022-08-02 NOTE — Progress Notes (Signed)
Subjective:    Patient ID: Vanessa Reynolds, female    DOB: 03-Nov-1945, 77 y.o.   MRN: [id number]  MILLER REFFNER is a 77 y.o. female presenting on 08/02/2022 for Urinary Tract Infection  Patient presents for a same day appointment.  HPI  UTI Dysuria Here with husband actually with same complaint. She reports recent onset dysuria persistent burning and discomfort. She has tried to drink more water. No recent UTI has had similar in past. Admits some dysfunction with bladder may have leakage but not as often. Denies any fever chills nausea vomiting back pain      02/28/2022   10:14 AM 08/27/2021    8:18 AM 04/17/2020   10:51 AM  Depression screen PHQ 2/9  Decreased Interest 0 0 0  Down, Depressed, Hopeless 0 0 0  PHQ - 2 Score 0 0 0  Altered sleeping 0    Tired, decreased energy 0    Change in appetite 0    Feeling bad or failure about yourself  0    Trouble concentrating 0    Moving slowly or fidgety/restless 0    Suicidal thoughts 0    PHQ-9 Score 0    Difficult doing work/chores Not difficult at all      Social History   Tobacco Use   Smoking status: Former    Packs/day: 2.00    Years: 30.00    Additional pack years: 0.00    Total pack years: 60.00    Types: Cigarettes    Quit date: 05/20/1997    Years since quitting: 25.2   Smokeless tobacco: Former  Scientific laboratory technician Use: Never used  Substance Use Topics   Alcohol use: No   Drug use: No    Review of Systems Per HPI unless specifically indicated above     Objective:    BP 122/64 (BP Location: Left Arm, Patient Position: Sitting, Cuff Size: Normal)   Pulse 75   Temp (!) 97.1 F (36.2 C) (Temporal)   SpO2 98%   Wt Readings from Last 3 Encounters:  02/28/22 165 lb 12.8 oz (75.2 kg)  10/19/21 166 lb 3.2 oz (75.4 kg)  09/24/21 168 lb (76.2 kg)    Physical Exam Vitals and nursing note reviewed.  Constitutional:      General: She is not in acute distress.    Appearance: Normal appearance. She is  well-developed. She is not diaphoretic.     Comments: Well-appearing, comfortable, cooperative  HENT:     Head: Normocephalic and atraumatic.  Eyes:     General:        Right eye: No discharge.        Left eye: No discharge.     Conjunctiva/sclera: Conjunctivae normal.  Cardiovascular:     Rate and Rhythm: Normal rate.  Pulmonary:     Effort: Pulmonary effort is normal.  Musculoskeletal:     Right lower leg: No edema.     Left lower leg: No edema.  Skin:    General: Skin is warm and dry.     Findings: No erythema or rash.  Neurological:     Mental Status: She is alert and oriented to person, place, and time.  Psychiatric:        Mood and Affect: Mood normal.        Behavior: Behavior normal.        Thought Content: Thought content normal.     Comments: Well groomed, good eye contact, normal speech and  thoughts    Results for orders placed or performed in visit on 08/02/22  POCT Urinalysis Dipstick  Result Value Ref Range   Color, UA Yellow    Clarity, UA Cloudy    Glucose, UA Negative Negative   Bilirubin, UA Negative    Ketones, UA Negative    Spec Grav, UA 1.015 1.010 - 1.025   Blood, UA Trace (A)    pH, UA 6.0 5.0 - 8.0   Protein, UA Negative Negative   Urobilinogen, UA 0.2 0.2 or 1.0 E.U./dL   Nitrite, UA Negative    Leukocytes, UA Moderate (2+) (A) Negative   Appearance     Odor        Assessment & Plan:   Problem List Items Addressed This Visit   None Visit Diagnoses     Dysuria    -  Primary   Relevant Orders   POCT Urinalysis Dipstick (Completed)   Urine Culture   Acute cystitis with hematuria       Relevant Medications   cephALEXin (KEFLEX) 500 MG capsule   Other Relevant Orders   POCT Urinalysis Dipstick (Completed)   Urine Culture       Clinically consistent with UTI and confirmed on UA. No concern for pyelo today (no systemic symptoms, neg fever, back pain, n/v).  Plan: Urinalysis suggestive of UTI 2. Ordered Urine culture 3. Keflex  500mg  TID x 7 days 4. Improve PO hydration Follow up if not improved, return criteria    Orders Placed This Encounter  Procedures   Urine Culture   POCT Urinalysis Dipstick     Meds ordered this encounter  Medications   cephALEXin (KEFLEX) 500 MG capsule    Sig: Take 1 capsule (500 mg total) by mouth 3 (three) times daily. For 7 days    Dispense:  21 capsule    Refill:  0     Follow up plan: Return if symptoms worsen or fail to improve.   Nobie Putnam, [facility] 08/02/2022, 2:49 PM

## 2022-08-03 LAB — URINE CULTURE
MICRO NUMBER:: 14698784
Result:: NO GROWTH
SPECIMEN QUALITY:: ADEQUATE

## 2022-08-17 ENCOUNTER — Other Ambulatory Visit: Payer: Self-pay | Admitting: Family Medicine

## 2022-08-17 DIAGNOSIS — K219 Gastro-esophageal reflux disease without esophagitis: Secondary | ICD-10-CM

## 2022-08-19 NOTE — Telephone Encounter (Signed)
Requested Prescriptions  Pending Prescriptions Disp Refills   omeprazole (PRILOSEC) 20 MG capsule [Pharmacy Med Name: OMEPRAZOLE DR 20 MG CAP] 90 capsule 0    Sig: TAKE 1 CAPSULE BY MOUTH ONCE DAILY BEFORE BREAKFAST     Gastroenterology: Proton Pump Inhibitors Passed - 08/17/2022  9:48 AM      Passed - Valid encounter within last 12 months    Recent Outpatient Visits           2 weeks ago [place], DO   5 months ago Essential hypertension   [place], DO   10 months ago Acute conjunctivitis of both eyes, unspecified acute conjunctivitis type   [place], DO   10 months ago Acute cystitis without hematuria   [facility] Olin Hauser, DO   11 months ago Annual physical exam   [facility] Parks Ranger, Devonne Doughty, DO       Future Appointments             In 3 weeks Parks Ranger, Devonne Doughty, [facility], [facility]

## 2022-09-04 ENCOUNTER — Ambulatory Visit: Payer: 59 | Admitting: Family Medicine

## 2022-09-09 ENCOUNTER — Ambulatory Visit (INDEPENDENT_AMBULATORY_CARE_PROVIDER_SITE_OTHER): Payer: 59 | Admitting: Family Medicine

## 2022-09-09 ENCOUNTER — Encounter: Payer: Self-pay | Admitting: Family Medicine

## 2022-09-09 DIAGNOSIS — N3001 Acute cystitis with hematuria: Secondary | ICD-10-CM

## 2022-09-09 DIAGNOSIS — I10 Essential (primary) hypertension: Secondary | ICD-10-CM | POA: Diagnosis not present

## 2022-09-09 DIAGNOSIS — K219 Gastro-esophageal reflux disease without esophagitis: Secondary | ICD-10-CM

## 2022-09-09 DIAGNOSIS — E7849 Other hyperlipidemia: Secondary | ICD-10-CM

## 2022-09-09 MED ORDER — CEPHALEXIN 500 MG PO CAPS: 500.0000 mg | ORAL_CAPSULE | Freq: Three times a day (TID) | ORAL | 0 refills | Status: DC

## 2022-09-09 MED ORDER — LISINOPRIL 40 MG PO TABS: 40.0000 mg | ORAL_TABLET | Freq: Every day | ORAL | 3 refills | Status: DC

## 2022-09-09 MED ORDER — LOVASTATIN 20 MG PO TABS: 20.0000 mg | ORAL_TABLET | Freq: Every day | ORAL | 3 refills | Status: DC

## 2022-09-09 MED ORDER — OMEPRAZOLE 20 MG PO CPDR: 20.0000 mg | DELAYED_RELEASE_CAPSULE | Freq: Every day | ORAL | 3 refills | Status: DC

## 2022-09-09 NOTE — Patient Instructions (Addendum)
Thank you for coming to the office today.  Refilled BP and Cholesterol medications  Current BP is controlled.  Added a rx Keflex for antibiotic for urinary symptoms if not improving or get new episode.  You can take this if you need.  DUE for FASTING BLOOD WORK (no food or drink after midnight before the lab appointment, only water or coffee without cream/sugar on the morning of)  SCHEDULE "Lab Only" visit in the morning at the clinic for lab draw in 6 MONTHS   - Make sure Lab Only appointment is at about 1 week before your next appointment, so that results will be available  For Lab Results, once available within 2-3 days of blood draw, you can can log in to MyChart online to view your results and a brief explanation. Also, we can discuss results at next follow-up visit.   Please schedule a Follow-up Appointment to: Return in about 6 months (around 03/11/2023) for 6 month fasting lab then 1 week later Annual physical.  If you have any other questions or concerns, please feel free to call the office or send a message through MyChart. You may also schedule an earlier appointment if necessary.  Additionally, you may be receiving a survey about your experience at our office within a few days to 1 week by e-mail or mail. We value your feedback.  Saralyn Pilar, DO [facility], [place]

## 2022-09-09 NOTE — Progress Notes (Unsigned)
Subjective:    Patient ID: Vanessa Reynolds, female    DOB: 1945-12-08, 77 y.o.   MRN: [id number]  Vanessa Reynolds is a 77 y.o. female presenting on 09/09/2022 for Hypertension   HPI  UTI Dysuria Recent UTI, treated. Did not resolve 100%, seems to have occasional symptoms dysuria only but not consistent and not similar to prior UTI Admits some dysfunction with bladder may have leakage but not as often. 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 Tolerating well, w/o complaints. Denies CP, dyspnea, HA, edema / lightheadedness       09/09/2022    1:13 PM 02/28/2022   10:14 AM 08/27/2021    8:18 AM  Depression screen PHQ 2/9  Decreased Interest 1 0 0  Down, Depressed, Hopeless 1 0 0  PHQ - 2 Score 2 0 0  Altered sleeping 2 0   Tired, decreased energy 0 0   Change in appetite 0 0   Feeling bad or failure about yourself  0 0   Trouble concentrating 1 0   Moving slowly or fidgety/restless 0 0   Suicidal thoughts 0 0   PHQ-9 Score 5 0   Difficult doing work/chores  Not difficult at all     Social History   Tobacco Use   Smoking status: Former    Packs/day: 2.00    Years: 30.00    Additional pack years: 0.00    Total pack years: 60.00    Types: Cigarettes    Quit date: 05/20/1997    Years since quitting: 25.3   Smokeless tobacco: Former  Building services engineer Use: Never used  Substance Use Topics   Alcohol use: No   Drug use: No    Review of Systems Per HPI unless specifically indicated above     Objective:    BP (!) 112/50   Pulse 65   Ht 5' 3.5" (1.613 m)   Wt 167 lb (75.8 kg)   BMI 29.12 kg/m   Wt Readings from Last 3 Encounters:  09/09/22 167 lb (75.8 kg)  02/28/22 165 lb 12.8 oz (75.2 kg)  10/19/21 166 lb 3.2 oz (75.4 kg)    Physical Exam Vitals and nursing note  reviewed.  Constitutional:      General: She is not in acute distress.    Appearance: She is well-developed. She is not diaphoretic.     Comments: Well-appearing, comfortable, cooperative  HENT:     Head: Normocephalic and atraumatic.  Eyes:     General:        Right eye: No discharge.        Left eye: No discharge.     Conjunctiva/sclera: Conjunctivae normal.  Neck:     Thyroid: No thyromegaly.  Cardiovascular:     Rate and Rhythm: Normal rate and regular rhythm.     Heart sounds: Normal heart sounds. No murmur heard. Pulmonary:     Effort: Pulmonary effort is normal. No respiratory distress.     Breath sounds: Normal breath sounds. No wheezing or rales.  Musculoskeletal:        General: Normal range of motion.     Cervical back: Normal range of motion and neck supple.  Lymphadenopathy:     Cervical: No cervical adenopathy.  Skin:    General: Skin is warm and dry.  Findings: No erythema or rash.  Neurological:     Mental Status: She is alert and oriented to person, place, and time.  Psychiatric:        Behavior: Behavior normal.     Comments: Well groomed, good eye contact, normal speech and thoughts       Results for orders placed or performed in visit on 08/02/22  Urine Culture   Specimen: Urine  Result Value Ref Range   MICRO NUMBER: [id number]    SPECIMEN QUALITY: Adequate    Sample Source URINE    STATUS: FINAL    Result: No Growth   POCT Urinalysis Dipstick  Result Value Ref Range   Color, UA Yellow    Clarity, UA Cloudy    Glucose, UA Negative Negative   Bilirubin, UA Negative    Ketones, UA Negative    Spec Grav, UA 1.015 1.010 - 1.025   Blood, UA Trace (A)    pH, UA 6.0 5.0 - 8.0   Protein, UA Negative Negative   Urobilinogen, UA 0.2 0.2 or 1.0 E.U./dL   Nitrite, UA Negative    Leukocytes, UA Moderate (2+) (A) Negative   Appearance     Odor        Assessment & Plan:   Problem List Items Addressed This Visit     Essential hypertension    Relevant Medications   lisinopril (ZESTRIL) 40 MG tablet   lovastatin (MEVACOR) 20 MG tablet   Gastroesophageal reflux disease without esophagitis   Relevant Medications   omeprazole (PRILOSEC) 20 MG capsule   Hyperlipidemia   Relevant Medications   lisinopril (ZESTRIL) 40 MG tablet   lovastatin (MEVACOR) 20 MG tablet   Other Visit Diagnoses     Acute cystitis with hematuria       Relevant Medications   cephALEXin (KEFLEX) 500 MG capsule       HYPERTENSION Hyperlipidemia  Controlled Refilled BP and Cholesterol medications Current BP is controlled.  Recurrent UTI Not clinically consistent today Future plan, will order a rx Keflex for antibiotic for urinary symptoms if not improving or get new episode.  GERD Re order PPI therapy   Meds ordered this encounter  Medications   lisinopril (ZESTRIL) 40 MG tablet    Sig: Take 1 tablet (40 mg total) by mouth at bedtime.    Dispense:  90 tablet    Refill:  3   lovastatin (MEVACOR) 20 MG tablet    Sig: Take 1 tablet (20 mg total) by mouth at bedtime.    Dispense:  90 tablet    Refill:  3   omeprazole (PRILOSEC) 20 MG capsule    Sig: Take 1 capsule (20 mg total) by mouth daily before breakfast.    Dispense:  90 capsule    Refill:  3    Add refills   cephALEXin (KEFLEX) 500 MG capsule    Sig: Take 1 capsule (500 mg total) by mouth 3 (three) times daily. For 7 days    Dispense:  21 capsule    Refill:  0      Follow up plan: Return in about 6 months (around 03/11/2023) for 6 month fasting lab then 1 week later Annual physical.  Future labs ordered for 03/11/23   Saralyn Pilar, DO [facility] 09/09/2022, 11:20 AM

## 2022-09-10 ENCOUNTER — Other Ambulatory Visit: Payer: Self-pay | Admitting: Family Medicine

## 2022-09-10 DIAGNOSIS — Z0000 Encounter for general adult medical examination without abnormal findings: Secondary | ICD-10-CM

## 2022-09-10 DIAGNOSIS — E559 Vitamin D deficiency, unspecified: Secondary | ICD-10-CM

## 2022-09-10 DIAGNOSIS — E538 Deficiency of other specified B group vitamins: Secondary | ICD-10-CM

## 2022-09-10 DIAGNOSIS — R7309 Other abnormal glucose: Secondary | ICD-10-CM

## 2022-09-10 DIAGNOSIS — E782 Mixed hyperlipidemia: Secondary | ICD-10-CM

## 2022-09-10 DIAGNOSIS — I10 Essential (primary) hypertension: Secondary | ICD-10-CM

## 2022-09-27 ENCOUNTER — Telehealth: Payer: Self-pay | Admitting: Family Medicine

## 2022-09-27 NOTE — Telephone Encounter (Signed)
Called patient to schedule Medicare Annual Wellness Visit (AWV). Left message for patient to call back and schedule Medicare Annual Wellness Visit (AWV).  Last date of AWV: 08/27/21  Please schedule an appointment at any time with Kennedy Bucker, LPN  .  If any questions, please contact me.  Thank you ,  Verlee Rossetti; Care Guide Ambulatory Clinical Support [facility] Direct Dial: [phone]

## 2022-12-04 ENCOUNTER — Ambulatory Visit: Payer: 59 | Admitting: Internal Medicine

## 2022-12-05 ENCOUNTER — Ambulatory Visit (INDEPENDENT_AMBULATORY_CARE_PROVIDER_SITE_OTHER): Payer: 59 | Admitting: Internal Medicine

## 2022-12-05 ENCOUNTER — Encounter: Payer: Self-pay | Admitting: Internal Medicine

## 2022-12-05 VITALS — BP 142/78 | HR 69 | Temp 96.0°F | Wt 165.0 lb

## 2022-12-05 DIAGNOSIS — Z1159 Encounter for screening for other viral diseases: Secondary | ICD-10-CM | POA: Diagnosis not present

## 2022-12-05 DIAGNOSIS — Z205 Contact with and (suspected) exposure to viral hepatitis: Secondary | ICD-10-CM | POA: Diagnosis not present

## 2022-12-05 NOTE — Patient Instructions (Signed)
Hepatitis C Hepatitis C is a liver infection that is caused by the hepatitis C virus (HCV). The virus infects and causes inflammation in the liver. Hepatitis C can lead to: Loss of liver function (liver failure). Scarring of the liver (cirrhosis). Liver cancer. People with hepatitis C often do not know for months or years that they have this condition. This is because they have no symptoms or may have only mild symptoms. What are the causes? This condition is caused by HCV. The virus can spread from person to person (is contagious). It can spread through: Contact with an infected person's blood, semen, or vaginal fluids. Childbirth. A woman who has hepatitis C can pass it to her baby during birth. Having received donated blood (blood transfusion) or an organ transplant that was done in the [place] before 1992. What increases the risk? The following factors may make you more likely to develop this condition: Having contact with needles or syringes that have HCV on them (are contaminated). This may happen while injecting drugs, getting a tattoo or body piercing, or receiving acupuncture. Acupuncture is a treatment that inserts thin needles through your skin. Contact also may happen when you: Have sex with someone who is infected. The virus can spread through vaginal, oral, or anal sex. Receive treatment to filter your blood (kidney dialysis). Have a job that involves contact with blood or body fluids, such as in health care. Having HIV (human immunodeficiency virus) or AIDS (acquired immunodeficiency syndrome). What are the signs or symptoms? Symptoms of this condition include: Tiredness (fatigue). Loss of appetite. Nausea or vomiting. Pain in your abdomen. Dark yellow urine. Yellowing of your skin or the white parts of your eyes (jaundice). Itchy skin. Light-colored or tan stool. Joint pain. Bleeding and bruising that happen often. Fluid buildup in your stomach (ascites). Often,  hepatitis C causes no symptoms. How is this diagnosed? This condition is diagnosed with: Blood tests. Other tests that show how well your liver is working. These tests may include: Magnetic resonance elastography (MRE). This imaging test uses MRI and sound waves to measure liver stiffness. Transient elastography. This imaging test uses ultrasound to measure liver stiffness. Liver biopsy. In this test, a tissue sample is taken from your liver and looked at under a microscope. How is this treated? Treatment may depend on how severe your condition is, how long it has lasted, and whether you have liver damage. Treatment may include: Taking antiviral medicines and other medicines. Having follow-up treatments every 6-12 months for infections or other liver problems. Having a liver transplant. Follow these instructions at home: Medicines Take over-the-counter and prescription medicines only as told by your health care provider. If you were prescribed an antiviral medicine, take it as told by your health care provider. Do not stop using the antiviral even if you start to feel better. Do not take any new medicines, including over-the-counter medicines or supplements, unless your health care provider approves. Activity Rest as needed. Do not have sex unless your health care provider approves. Avoid swimming or using hot tubs if you have open sores or wounds. Return to your normal activities as told by your health care provider. Ask your health care provider what activities are safe for you. Ask your health care provider when you may return to school or work. Eating and drinking  Eat a balanced diet with plenty of fruits and vegetables, whole grains, and lean meats or non-meat proteins, such as beans or tofu. Drink enough fluid to keep your  urine pale yellow. Do not drink alcohol. General instructions Do not share toothbrushes, nail clippers, or razors. Wash your hands often with soap and water  for at least 20 seconds. If soap and water are not available, use alcohol-based hand sanitizer. Cover any cuts or open sores on your skin to prevent spreading HCV. Keep all follow-up visits. This is important. You may need follow-up visits every 6-12 months. How is this prevented? There is no vaccine for hepatitis C. You can lessen your risk of coming into contact with HCV by making sure you: Wash your hands often with soap and water for at least 20 seconds. Do not share needles or syringes. Use a condom every time you have vaginal, oral, or anal sex. Latex condoms offer the best protection. Avoid handling blood or other body fluids without gloves or other protection. Avoid getting tattoos or body piercings in shops that are not clean. Where to find more information Centers for Disease Control and Prevention: SaveSearches.co.nz World Health Organization: https://castaneda-walker.com/ Contact a health care provider if you: Have a fever or chills. Have pain or swelling in your abdomen. Pass dark urine. Pass light-colored or tan stool. Have joint pain. Get help right away if you: Have more fatigue. Lose your appetite. Cannot eat or drink without vomiting. Develop jaundice, or your jaundice gets worse. Bruise or bleed easily. Summary Hepatitis C is a liver infection that is caused by the hepatitis C virus (HCV). This infection can lead to a loss of liver function (liver failure), scarring of the liver (cirrhosis), or liver cancer. HCV can spread from person to person (is contagious). Do not take any medicines, including over-the-counter medicines or supplements, unless your health care provider approves. This information is not intended to replace advice given to you by your health care provider. Make sure you discuss any questions you have with your health care provider. Document Revised: 03/23/2020 Document Reviewed: 03/23/2020 Elsevier Patient Education  2024 [employer].

## 2022-12-05 NOTE — Progress Notes (Signed)
Subjective:    Patient ID: Vanessa Reynolds, female    DOB: 1945/10/22, 77 y.o.   MRN: [id number]  HPI  Patient presents to clinic today requesting to be tested for hepatitis C.  She reports her husband was recently diagnosed with hepatitis C.  They have been married for over 50 years.  She denies yellowing of the skin, abdominal pain, nausea, vomiting or changes in her bowel habits.  Her and her husband are not currently sexually active.  She had a negative of hep C antibody 06/2017.  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   Rectal cancer (HCC)     Current Outpatient Medications  Medication Sig Dispense Refill   Apple Cider Vinegar 188 MG CAPS Take by mouth.     cephALEXin (KEFLEX) 500 MG capsule Take 1 capsule (500 mg total) by mouth 3 (three) times daily. For 7 days 21 capsule 0   fluticasone (FLONASE) 50 MCG/ACT nasal spray PLACE 2 SPRAYS INTO BOTH NOSTRILS DAILY.USE FOR 4-6 WEEKS THEN STOP AND USE SEASONALLY OR AS NEEDED 48 g 1   lisinopril (ZESTRIL) 40 MG tablet Take 1 tablet (40 mg total) by mouth at bedtime. 90 tablet 3   loratadine (CLARITIN) 10 MG tablet Take 1 tablet (10 mg total) by mouth daily. Use for 4-6 weeks then stop, and use as needed or seasonally 90 tablet 1   lovastatin (MEVACOR) 20 MG tablet Take 1 tablet (20 mg total) by mouth at bedtime. 90 tablet 3   omeprazole (PRILOSEC) 20 MG capsule Take 1 capsule (20 mg total) by mouth daily before breakfast. 90 capsule 3   trimethoprim-polymyxin b (POLYTRIM) ophthalmic solution Place 1 drop into  both eyes every 6 (six) hours. For 7-10 days 10 mL 0   vitamin B-12 (CYANOCOBALAMIN) 100 MCG tablet Take 100 mcg by mouth daily.     No current facility-administered medications for this visit.    Allergies  Allergen Reactions   Fish Oil Other (See Comments)    Disruptions to Balance    Family History  Problem Relation Age of Onset   Hypertension Mother        deceased, age 25   Cancer Father        gastric cancer, deceased, mid 25's   Breast cancer Neg Hx     Social History   Socioeconomic History   Marital status: Married    Spouse name: Mayo Owczarzak   Number of children: 3   Years of education: Not on file   Highest education level: 10th grade  Occupational History    Comment: fulltime    Occupation: Retired  Tobacco Use   Smoking status: Former    Current packs/day: 0.00    Average packs/day: 2.0 packs/day for 30.0 years (60.0 ttl pk-yrs)    Types: Cigarettes    Start date: 05/21/1967    Quit date: 05/20/1997    Years since quitting: 25.5   Smokeless tobacco: Former  Advertising account planner  Vaping status: Never Used  Substance and Sexual Activity   Alcohol use: No   Drug use: No   Sexual activity: Not on file  Other Topics Concern   Not on file  Social History Narrative   Not on file   Social Determinants of Health   Financial Resource Strain: Low Risk  (08/27/2021)   Overall Financial Resource Strain (CARDIA)    Difficulty of Paying Living Expenses: Not hard at all  Food Insecurity: No Food Insecurity (08/27/2021)   Hunger Vital Sign    Worried About Running Out of Food in the Last Year: Never true    Ran Out of Food in the Last Year: Never true  Transportation Needs: No Transportation Needs (08/27/2021)   PRAPARE - Administrator, Civil Service (Medical): No    Lack of Transportation (Non-Medical): No  Physical Activity: Inactive (08/27/2021)   Exercise Vital Sign    Days of Exercise per Week: 0 days    Minutes of Exercise per Session: 0 min   Stress: No Stress Concern Present (08/27/2021)   [employer] of Occupational Health - Occupational Stress Questionnaire    Feeling of Stress : Not at all  Social Connections: Socially Integrated (08/27/2021)   Social Connection and Isolation Panel [NHANES]    Frequency of Communication with Friends and Family: More than three times a week    Frequency of Social Gatherings with Friends and Family: Not on file    Attends Religious Services: More than 4 times per year    Active Member of [employer] or Organizations: Yes    Attends Banker Meetings: More than 4 times per year    Marital Status: Married  Catering manager Violence: Not At Risk (07/01/2017)   Humiliation, Afraid, Rape, and Kick questionnaire    Fear of Current or Ex-Partner: No    Emotionally Abused: No    Physically Abused: No    Sexually Abused: No     Constitutional: Denies fever, malaise, fatigue, headache or abrupt weight changes.  Respiratory: Denies difficulty breathing, shortness of breath, cough or sputum production.   Cardiovascular: Denies chest pain, chest tightness, palpitations or swelling in the hands or feet.  Gastrointestinal: Denies abdominal pain, bloating, constipation, diarrhea or blood in the stool.  Skin: Denies redness, rashes, lesions or ulcercations.   No other specific complaints in a complete review of systems (except as listed in HPI above).  Objective:   Physical Exam   BP (!) 142/78 (BP Location: Left Arm, Patient Position: Sitting, Cuff Size: Normal)   Pulse 69   Temp (!) 96 F (35.6 C) (Temporal)   Wt 165 lb (74.8 kg)   SpO2 99%   BMI 28.77 kg/m   Wt Readings from Last 3 Encounters:  09/09/22 167 lb (75.8 kg)  02/28/22 165 lb 12.8 oz (75.2 kg)  10/19/21 166 lb 3.2 oz (75.4 kg)    General: Appears her stated age, overweight, in NAD. Skin: Warm, dry and intact. No jaundice noted. Cardiovascular: Normal rate and rhythm. Pulmonary/Chest: Normal effort and positive  vesicular breath sounds. No respiratory distress. No wheezes, rales or ronchi noted.  Abdomen: Soft and nontender. Normal bowel sounds. Liver, spleen and kidneys non palpable. Musculoskeletal: No difficulty with gait.  Neurological: Alert and oriented.     BMET    Component Value Date/Time   NA 140 02/28/2022 1008   NA 140 02/20/2015 1138   K 4.2 02/28/2022 1008   CL 104 02/28/2022 1008   CO2 26  02/28/2022 1008   GLUCOSE 86 02/28/2022 1008   BUN 15 02/28/2022 1008   BUN 16 02/20/2015 1138   CREATININE 1.05 (H) 02/28/2022 1008   CALCIUM 9.5 02/28/2022 1008   GFRNONAA 71 08/07/2020 0827   GFRAA 82 08/07/2020 0827    Lipid Panel     Component Value Date/Time   CHOL 217 (H) 02/28/2022 1008   CHOL 198 02/20/2015 1138   TRIG 74 02/28/2022 1008   HDL 72 02/28/2022 1008   HDL 62 02/20/2015 1138   CHOLHDL 3.0 02/28/2022 1008   VLDL 16 06/17/2016 0001   LDLCALC 128 (H) 02/28/2022 1008    CBC    Component Value Date/Time   WBC 5.8 02/28/2022 1008   RBC 4.30 02/28/2022 1008   HGB 13.5 02/28/2022 1008   HGB 13.1 02/20/2015 1138   HCT 39.9 02/28/2022 1008   HCT 39.0 02/20/2015 1138   PLT 102 (L) 02/28/2022 1008   PLT 207 02/20/2015 1138   MCV 92.8 02/28/2022 1008   MCV 90 02/20/2015 1138   MCH 31.4 02/28/2022 1008   MCHC 33.8 02/28/2022 1008   RDW 12.5 02/28/2022 1008   RDW 13.4 02/20/2015 1138   LYMPHSABS 1,311 02/28/2022 1008   LYMPHSABS 1.9 02/20/2015 1138   MONOABS 0.3 01/03/2020 1222   EOSABS 122 02/28/2022 1008   EOSABS 0.5 (H) 02/20/2015 1138   BASOSABS 52 02/28/2022 1008   BASOSABS 0.1 02/20/2015 1138    Hgb A1C Lab Results  Component Value Date   HGBA1C 5.4 02/28/2022           Assessment & Plan:   Screen for hepatitis C, exposure to hepatitis C:  Will obtain hep C antibody today  Follow-up with your PCP as previously scheduled Nicki Reaper, NP

## 2022-12-06 ENCOUNTER — Telehealth: Payer: Self-pay

## 2022-12-06 LAB — HEPATITIS C ANTIBODY: Hepatitis C Ab: NONREACTIVE

## 2022-12-06 NOTE — Telephone Encounter (Signed)
Left message informing patient of negative results. 

## 2022-12-06 NOTE — Telephone Encounter (Signed)
-----   Message from [facility] sent at 12/06/2022  1:19 PM EDT ----- Hep c antibody negative

## 2022-12-09 NOTE — Telephone Encounter (Signed)
Patient aware of results.

## 2022-12-19 ENCOUNTER — Ambulatory Visit: Payer: Self-pay | Admitting: *Deleted

## 2022-12-19 NOTE — Telephone Encounter (Signed)
Summary: Sore throat   Patient's husband said that patient has had a sore throat for about a week. No appointments available in office until next week. Please advise.          Called patient [phone] to review sx of sore throat. No answer, LVMTCB [phone].

## 2022-12-19 NOTE — Telephone Encounter (Signed)
Apt with Erin at 2:40 on 12/20/2022.   Thanks,   -Vernona Rieger

## 2022-12-19 NOTE — Telephone Encounter (Signed)
  Chief Complaint: Very sore throat Symptoms: Swollen tonsils - feels like there is something on them. No other s/s - occasional runny nose Frequency: 1 week Pertinent Negatives: Patient denies fever Disposition: [] ED /[x] Urgent Care (no appt availability in office) / [] Appointment(In office/virtual)/ []  [facility]/ [] Home Care/ [x] Refused Recommended Disposition /[] [facility]/ []  Follow-up with PCP Additional Notes: Pt has had a very sore throat for 1 week. Pt had a HA, but took Tylenol and HA resolved. Pt states that her throat is very painful, but able to swallow.  No appts in office. Pt refuses UC - she doesn't wan to go anywhere else. Pt would like to be worked in. Pt requests a call back regarding being worked in.  Summary: Sore throat   Patient's husband said that patient has had a sore throat for about a week. No appointments available in office until next week. Please advise.     Reason for Disposition  [1] Pus on tonsils (back of throat) AND [2]  fever AND [3] swollen neck lymph nodes ("glands")  Answer Assessment - Initial Assessment Questions 1. ONSET: "When did the throat start hurting?" (Hours or days ago)      1 week 2. SEVERITY: "How bad is the sore throat?" (Scale 1-10; mild, moderate or severe)   - MILD (1-3):  Doesn't interfere with eating or normal activities.   - MODERATE (4-7): Interferes with eating some solids and normal activities.   - SEVERE (8-10):  Excruciating pain, interferes with most normal activities.   - SEVERE WITH DYSPHAGIA (10): Can't swallow liquids, drooling.     Severe 3. STREP EXPOSURE: "Has there been any exposure to strep within the past week?" If Yes, ask: "What type of contact occurred?"      no 4.  VIRAL SYMPTOMS: "Are there any symptoms of a cold, such as a runny nose, cough, hoarse voice or red eyes?"      Runny nose - not all the time 5. FEVER: "Do you have a fever?" If Yes, ask: "What is your temperature, how was  it measured, and when did it start?"     no 6. PUS ON THE TONSILS: "Is there pus on the tonsils in the back of your throat?"     Feel inflamed - something on them 7. OTHER SYMPTOMS: "Do you have any other symptoms?" (e.g., difficulty breathing, headache, rash)     no  Protocols used: Sore Throat-A-AH

## 2022-12-20 ENCOUNTER — Ambulatory Visit (INDEPENDENT_AMBULATORY_CARE_PROVIDER_SITE_OTHER): Payer: 59 | Admitting: Physician Assistant

## 2022-12-20 ENCOUNTER — Encounter: Payer: Self-pay | Admitting: Physician Assistant

## 2022-12-20 VITALS — BP 132/64 | HR 70 | Temp 96.6°F | Wt 165.0 lb

## 2022-12-20 DIAGNOSIS — J069 Acute upper respiratory infection, unspecified: Secondary | ICD-10-CM

## 2022-12-20 DIAGNOSIS — J029 Acute pharyngitis, unspecified: Secondary | ICD-10-CM

## 2022-12-20 NOTE — Progress Notes (Unsigned)
Acute Office Visit   Patient: Vanessa Reynolds   DOB: 09-03-1945   77 y.o. Female  MRN: [id number] Visit Date: 12/20/2022  Today's healthcare provider: Oswaldo Conroy , PA-C  Introduced myself to the patient as a Secondary school teacher and provided education on APPs in clinical practice.    Chief Complaint  Patient presents with   Sore Throat    Started last week.     Subjective    HPI HPI     Sore Throat    Additional comments: Started last week.        Last edited by Paschal Dopp, CMA on 12/20/2022  3:08 PM.       Concern for Sore throat  Onset: sudden  She thinks it is getting a little bit better  Duration: about a week  Associated symptoms: she reports sore throat and some pain with swallowing that is getting better  She denies fever, chills, trouble breathing,  She denies recent sick contacts She denies recent travel  Interventions:Tylenol for headache       Medications: Outpatient Medications Prior to Visit  Medication Sig   Apple Cider Vinegar 188 MG CAPS Take by mouth.   fluticasone (FLONASE) 50 MCG/ACT nasal spray PLACE 2 SPRAYS INTO BOTH NOSTRILS DAILY.USE FOR 4-6 WEEKS THEN STOP AND USE SEASONALLY OR AS NEEDED   lisinopril (ZESTRIL) 40 MG tablet Take 1 tablet (40 mg total) by mouth at bedtime.   loratadine (CLARITIN) 10 MG tablet Take 1 tablet (10 mg total) by mouth daily. Use for 4-6 weeks then stop, and use as needed or seasonally   lovastatin (MEVACOR) 20 MG tablet Take 1 tablet (20 mg total) by mouth at bedtime.   omeprazole (PRILOSEC) 20 MG capsule Take 1 capsule (20 mg total) by mouth daily before breakfast.   vitamin B-12 (CYANOCOBALAMIN) 100 MCG tablet Take 100 mcg by mouth daily.   cephALEXin (KEFLEX) 500 MG capsule Take 1 capsule (500 mg total) by mouth 3 (three) times daily. For 7 days (Patient not taking: Reported on 12/20/2022)   trimethoprim-polymyxin b (POLYTRIM) ophthalmic solution Place 1 drop into both eyes every 6 (six) hours. For 7-10 days  (Patient not taking: Reported on 12/20/2022)   No facility-administered medications prior to visit.    Review of Systems  Constitutional:  Negative for chills and fever.  HENT:  Positive for congestion, postnasal drip, rhinorrhea, sinus pressure and sore throat. Negative for trouble swallowing.   Respiratory:  Positive for cough. Negative for choking, shortness of breath and wheezing.   Gastrointestinal:  Positive for nausea. Negative for diarrhea and vomiting.  Musculoskeletal:  Negative for myalgias.  Skin:  Negative for rash.  Neurological:  Positive for headaches. Negative for dizziness and light-headedness.    {Insert previous labs (optional):23779}  {See past labs  Heme  Chem  Endocrine  Serology  Results Review (optional):1}   Objective    BP 132/64 (BP Location: Right Arm, Patient Position: Sitting, Cuff Size: Normal)   Pulse 70   Temp (!) 96.6 F (35.9 C) (Temporal)   Wt 165 lb (74.8 kg)   SpO2 97%   BMI 28.77 kg/m  {Insert last BP/Wt (optional):23777}  {See vitals history (optional):1}  Physical Exam Vitals reviewed.  Constitutional:      Appearance: She is well-developed.  HENT:     Head: Normocephalic and atraumatic.     Right Ear: Hearing, tympanic membrane and ear canal normal.     Left Ear:  Hearing and ear canal normal. There is impacted cerumen.     Mouth/Throat:     Lips: Pink.     Mouth: Mucous membranes are moist.     Pharynx: Uvula midline. Posterior oropharyngeal erythema present. No pharyngeal swelling, oropharyngeal exudate, uvula swelling or postnasal drip.     Tonsils: No tonsillar exudate or tonsillar abscesses.  Eyes:     General: Lids are normal. Gaze aligned appropriately.  Cardiovascular:     Rate and Rhythm: Normal rate and regular rhythm.     Pulses: Normal pulses.          Radial pulses are 2+ on the right side and 2+ on the left side.     Heart sounds: Normal heart sounds. No murmur heard.    No friction rub. No gallop.   Pulmonary:     Effort: Pulmonary effort is normal.     Breath sounds: Normal breath sounds. No decreased air movement. No decreased breath sounds, wheezing, rhonchi or rales.  Lymphadenopathy:     Head:     Right side of head: No submental, submandibular or preauricular adenopathy.     Left side of head: No submental, submandibular or preauricular adenopathy.     Cervical:     Right cervical: No superficial or posterior cervical adenopathy.    Left cervical: No superficial or posterior cervical adenopathy.     Upper Body:     Right upper body: No supraclavicular adenopathy.     Left upper body: No supraclavicular adenopathy.  Neurological:     Mental Status: She is alert.       No results found for any visits on 12/20/22.  Assessment & Plan      No follow-ups on file.        Problem List Items Addressed This Visit   None Visit Diagnoses     Upper respiratory tract infection, unspecified type    -  Primary   Sore throat            No follow-ups on file.   I,  E , PA-C, have reviewed all documentation for this visit. The documentation on 12/20/22 for the exam, diagnosis, procedures, and orders are all accurate and complete.   Jacquelin Hawking, MHS, PA-C [facility]

## 2022-12-20 NOTE — Patient Instructions (Signed)
Based on your described symptoms and the duration of symptoms it is likely that you have a viral upper respiratory infection (often called a "cold")  Symptoms can last for 3-10 days with lingering cough and intermittent symptoms lasting weeks after that.  The goal of treatment at this time is to reduce your symptoms and discomfort    You can use over the counter medications such as Dayquil/Nyquil, AlkaSeltzer formulations, etc to provide further relief of symptoms according to the manufacturer's instructions  If preferred you can use Coricidin to manage your symptoms rather than those medications mentioned above.   I also recommend adding an antihistamine to your daily regimen This includes medications like Claritin, Allegra, Zyrtec- the generics of these work very well and are usually less expensive I recommend using Flonase nasal spray - 2 puffs twice per day to help with your nasal congestion The antihistamines and Flonase can take a few weeks to provide significant relief from allergy symptoms but should start to provide some benefit soon.   If your symptoms do not improve or become worse in the next 5-7 days please make an apt at the office so we can see you  Go to the ER if you begin to have more serious symptoms such as shortness of breath, trouble breathing, loss of consciousness, swelling around the eyes, high fever, severe lasting headaches, vision changes or neck pain/stiffness.

## 2022-12-23 LAB — POCT RAPID STREP A (OFFICE): Rapid Strep A Screen: NEGATIVE

## 2023-02-26 ENCOUNTER — Telehealth: Payer: Self-pay | Admitting: Family Medicine

## 2023-02-26 NOTE — Telephone Encounter (Signed)
Called LVM 02/26/2023 to schedule AWV   Vanessa Reynolds; Care Guide Ambulatory Clinical Support [place] [facility] Direct Dial: [phone]

## 2023-03-11 ENCOUNTER — Other Ambulatory Visit: Payer: 59

## 2023-03-14 ENCOUNTER — Other Ambulatory Visit: Payer: 59

## 2023-03-14 DIAGNOSIS — I10 Essential (primary) hypertension: Secondary | ICD-10-CM

## 2023-03-14 DIAGNOSIS — E782 Mixed hyperlipidemia: Secondary | ICD-10-CM

## 2023-03-14 DIAGNOSIS — R7309 Other abnormal glucose: Secondary | ICD-10-CM

## 2023-03-14 DIAGNOSIS — E559 Vitamin D deficiency, unspecified: Secondary | ICD-10-CM

## 2023-03-14 DIAGNOSIS — Z0000 Encounter for general adult medical examination without abnormal findings: Secondary | ICD-10-CM

## 2023-03-14 DIAGNOSIS — E538 Deficiency of other specified B group vitamins: Secondary | ICD-10-CM

## 2023-03-19 ENCOUNTER — Ambulatory Visit (INDEPENDENT_AMBULATORY_CARE_PROVIDER_SITE_OTHER): Payer: 59 | Admitting: Family Medicine

## 2023-03-19 ENCOUNTER — Encounter: Payer: Self-pay | Admitting: Family Medicine

## 2023-03-19 VITALS — BP 130/64 | HR 62 | Ht 63.75 in | Wt 167.6 lb

## 2023-03-19 DIAGNOSIS — R7309 Other abnormal glucose: Secondary | ICD-10-CM | POA: Diagnosis not present

## 2023-03-19 DIAGNOSIS — I10 Essential (primary) hypertension: Secondary | ICD-10-CM

## 2023-03-19 DIAGNOSIS — Z23 Encounter for immunization: Secondary | ICD-10-CM | POA: Diagnosis not present

## 2023-03-19 DIAGNOSIS — I700 Atherosclerosis of aorta: Secondary | ICD-10-CM | POA: Diagnosis not present

## 2023-03-19 DIAGNOSIS — K219 Gastro-esophageal reflux disease without esophagitis: Secondary | ICD-10-CM

## 2023-03-19 DIAGNOSIS — Z0000 Encounter for general adult medical examination without abnormal findings: Secondary | ICD-10-CM

## 2023-03-19 DIAGNOSIS — E559 Vitamin D deficiency, unspecified: Secondary | ICD-10-CM | POA: Diagnosis not present

## 2023-03-19 DIAGNOSIS — E782 Mixed hyperlipidemia: Secondary | ICD-10-CM

## 2023-03-19 DIAGNOSIS — E538 Deficiency of other specified B group vitamins: Secondary | ICD-10-CM | POA: Diagnosis not present

## 2023-03-19 NOTE — Assessment & Plan Note (Signed)
Continue Omeprazole 20mg , switch admin instructions, she was taking more AS NEEDED later in day Switch to daily before 1st meal

## 2023-03-19 NOTE — Assessment & Plan Note (Signed)
Controlled cholesterol on statin and improving lifestyle Last lipid panel 2023 Calculated ASCVD 10 yr risk score, elevated based on HTN, age  Plan: 1. Continue current meds - Lovastatin 20mg  daily 2. Future consider ASA 81mg  for primary ASCVD risk reduction 3. Encourage improved lifestyle - low carb/cholesterol, reduce portion size, continue improving regular exercise

## 2023-03-19 NOTE — Patient Instructions (Addendum)
Thank you for coming to the office today.  BP improved on repeat check.  Refills on file  Labs today  Heart sounds good to me.  DUE for FASTING BLOOD WORK (no food or drink after midnight before the lab appointment, only water or coffee without cream/sugar on the morning of)  SCHEDULE "Lab Only" visit in the morning at the clinic for lab draw in 1 YEAR  - Make sure Lab Only appointment is at about 1 week before your next appointment, so that results will be available  For Lab Results, once available within 2-3 days of blood draw, you can can log in to MyChart online to view your results and a brief explanation. Also, we can discuss results at next follow-up visit.   Please schedule a Follow-up Appointment to: Return in about 1 year (around 03/18/2024) for 1 year fasting lab only then 1 week later Annual Physical.  If you have any other questions or concerns, please feel free to call the office or send a message through MyChart. You may also schedule an earlier appointment if necessary.  Additionally, you may be receiving a survey about your experience at our office within a few days to 1 week by e-mail or mail. We value your feedback.  Saralyn Pilar, DO [facility], [place]

## 2023-03-19 NOTE — Progress Notes (Signed)
Subjective:    Patient ID: Vanessa Reynolds, female    DOB: [birth year], 77 y.o.   MRN: [id number]  Vanessa Reynolds is a 77 y.o. female presenting on 03/19/2023 for Annual Exam   HPI  Here for Annual Physical and Fasting Labs  Discussed the use of AI scribe software for clinical note transcription with the patient, who gave verbal consent to proceed.     CHRONIC HTN Elevated BP now recently. Home Bp not checking as often but she can monitor it more. Admits salt and stress can raise BP She is taking Lisinopril 40mg  daily.  Reports good compliance, took meds today. Tolerating well, w/o complaints. Denies CP, dyspnea, HA, edema / lightheadedness   History of Murmur History of Rheumatic Fever Mixed history of consistency with hearing murmur. Prior doctor advised to monitor in future. She has had ECHO, last done 2021. Asymptomatic.  Meningioma Followed by Neurosurgery Dr Myer Haff for Meningioma surveillance. Now no recent follow-up. On surveillance if she has problem.  HYPERLIPIDEMIA: - Reports no concerns. Last lipid panel 2023, due for lab - Currently taking Lovastatin 20mg  daily, tolerating well without side effects or myalgias   Atherosclerosis Aorta On imaging CT 2021 On statin therapy   GERD The patient also mentions that her medication for acid reflux doesn't seem to be working as effectively as it should. She takes it once a day, usually before supper or in the morning if she doesn't feel nauseated. She plans to continue with the current batch of medication before considering a dosage adjustment.  Vitamin D Lab controlled in range previously On Supplement   Vitamin B12 Deficiency Continues on injection therapy  Due for lab  Flu Shot today  UTD PNEUMONIA vaccine   Future Shingles vaccine when ready.   Colonoscopy updated 12/2019, next due 5 years, 2026.       03/19/2023    9:18 AM 09/09/2022    1:13 PM 02/28/2022   10:14 AM  Depression screen PHQ 2/9   Decreased Interest 0 1 0  Down, Depressed, Hopeless 0 1 0  PHQ - 2 Score 0 2 0  Altered sleeping 1 2 0  Tired, decreased energy 0 0 0  Change in appetite 0 0 0  Feeling bad or failure about yourself  0 0 0  Trouble concentrating 0 1 0  Moving slowly or fidgety/restless 0 0 0  Suicidal thoughts 0 0 0  PHQ-9 Score 1 5 0  Difficult doing work/chores Not difficult at all  Not difficult at all    Past Medical History:  Diagnosis Date   Abdominal pain, left lower quadrant 2012   Allergy    Breast screening, unspecified    H/O cystitis 2011   History of colon cancer    Hyperlipidemia    Hypertension    Nausea with vomiting    Personal history of malignant neoplasm of large intestine 2013   Personal history of tobacco use, presenting hazards to health    Rectal cancer (HCC) 04/2010   Adenocarcinoma arising in a villous adenoma, PT 1, N0.   Rectal cancer [facility])    Past Surgical History:  Procedure Laterality Date   APPENDECTOMY  December 2011   BILATERAL OOPHORECTOMY  December 2011   Completed at time of low anterior resection.   COLON SURGERY  03/14/10   lap assisted low anterior resection-adenocarcinoma of the rectum and a villous adenoma   COLONOSCOPY  2011,2012,2015   Dr. Mechele Collin, Dr. Santo Held)   COLONOSCOPY WITH PROPOFOL  Subjective:    Patient ID: Vanessa Reynolds, female    DOB: [birth year], 77 y.o.   MRN: [id number]  Vanessa Reynolds is a 77 y.o. female presenting on 03/19/2023 for Annual Exam   HPI  Here for Annual Physical and Fasting Labs  Discussed the use of AI scribe software for clinical note transcription with the patient, who gave verbal consent to proceed.     CHRONIC HTN Elevated BP now recently. Home Bp not checking as often but she can monitor it more. Admits salt and stress can raise BP She is taking Lisinopril 40mg  daily.  Reports good compliance, took meds today. Tolerating well, w/o complaints. Denies CP, dyspnea, HA, edema / lightheadedness   History of Murmur History of Rheumatic Fever Mixed history of consistency with hearing murmur. Prior doctor advised to monitor in future. She has had ECHO, last done 2021. Asymptomatic.  Meningioma Followed by Neurosurgery Dr Myer Haff for Meningioma surveillance. Now no recent follow-up. On surveillance if she has problem.  HYPERLIPIDEMIA: - Reports no concerns. Last lipid panel 2023, due for lab - Currently taking Lovastatin 20mg  daily, tolerating well without side effects or myalgias   Atherosclerosis Aorta On imaging CT 2021 On statin therapy   GERD The patient also mentions that her medication for acid reflux doesn't seem to be working as effectively as it should. She takes it once a day, usually before supper or in the morning if she doesn't feel nauseated. She plans to continue with the current batch of medication before considering a dosage adjustment.  Vitamin D Lab controlled in range previously On Supplement   Vitamin B12 Deficiency Continues on injection therapy  Due for lab  Flu Shot today  UTD PNEUMONIA vaccine   Future Shingles vaccine when ready.   Colonoscopy updated 12/2019, next due 5 years, 2026.       03/19/2023    9:18 AM 09/09/2022    1:13 PM 02/28/2022   10:14 AM  Depression screen PHQ 2/9   Decreased Interest 0 1 0  Down, Depressed, Hopeless 0 1 0  PHQ - 2 Score 0 2 0  Altered sleeping 1 2 0  Tired, decreased energy 0 0 0  Change in appetite 0 0 0  Feeling bad or failure about yourself  0 0 0  Trouble concentrating 0 1 0  Moving slowly or fidgety/restless 0 0 0  Suicidal thoughts 0 0 0  PHQ-9 Score 1 5 0  Difficult doing work/chores Not difficult at all  Not difficult at all    Past Medical History:  Diagnosis Date   Abdominal pain, left lower quadrant 2012   Allergy    Breast screening, unspecified    H/O cystitis 2011   History of colon cancer    Hyperlipidemia    Hypertension    Nausea with vomiting    Personal history of malignant neoplasm of large intestine 2013   Personal history of tobacco use, presenting hazards to health    Rectal cancer (HCC) 04/2010   Adenocarcinoma arising in a villous adenoma, PT 1, N0.   Rectal cancer [facility])    Past Surgical History:  Procedure Laterality Date   APPENDECTOMY  December 2011   BILATERAL OOPHORECTOMY  December 2011   Completed at time of low anterior resection.   COLON SURGERY  03/14/10   lap assisted low anterior resection-adenocarcinoma of the rectum and a villous adenoma   COLONOSCOPY  2011,2012,2015   Dr. Mechele Collin, Dr. Santo Held)   COLONOSCOPY WITH PROPOFOL  She is alert and oriented to person, place, and time.     Comments: Distal sensation intact to light touch all extremities  Psychiatric:        Mood and Affect: Mood normal.        Behavior: Behavior normal.        Thought Content: Thought content normal.     Comments: Well groomed, good  eye contact, normal speech and thoughts      I have personally reviewed the radiology report from 12/09/19 ECHO ECHOCARDIOGRAM REPORT       Patient Name:   JNAI FAUVER Date of Exam: 12/09/2019  Medical Rec #:  [id number]      Height:       64.0 in  Accession #:    [id number]     Weight:       168.0 lb  Date of Birth:  10-17-1945      BSA:          1.817 m  Patient Age:    74 years       BP:           158/80 mmHg  Patient Gender: F              HR:           68 bpm.  Exam Location:  [place]   Procedure: 2D Echo, Cardiac Doppler and Color Doppler   Indications:    R01.1 Murmur; R42 Lightheaded    History:        Patient has no prior history of Echocardiogram  examinations.                 Signs/Symptoms:Murmur and Dizziness/Lightheadedness; Risk                  Factors:Hypertension, Dyslipidemia, Former Smoker and h/o                  rheumatic fever. GERD.    Sonographer:    Quentin Ore RDMS, RVT, RDCS  Referring Phys: [id number] BRIAN AGBOR-ETANG   IMPRESSIONS     1. Left ventricular ejection fraction, by estimation, is 60 to 65%. The  left ventricle has normal function. The left ventricle has no regional  wall motion abnormalities. There is mild left ventricular hypertrophy.  Left ventricular diastolic parameters  are consistent with Grade I diastolic dysfunction (impaired relaxation).   2. Right ventricular systolic function is normal. The right ventricular  size is moderately enlarged. There is normal pulmonary artery systolic  pressure.   3. The mitral valve is normal in structure. Trivial mitral valve  regurgitation.   4. The aortic valve is tricuspid. Aortic valve regurgitation is mild.   5. The inferior vena cava is normal in size with greater than 50%  respiratory variability, suggesting right atrial pressure of 3 mmHg.   FINDINGS   Left Ventricle: Left ventricular ejection fraction, by estimation, is 60  to 65%. The left ventricle has normal function. The  left ventricle has no  regional wall motion abnormalities. The left ventricular internal cavity  size was normal in size. There is   mild left ventricular hypertrophy. Left ventricular diastolic parameters  are consistent with Grade I diastolic dysfunction (impaired relaxation).   Right Ventricle: The right ventricular size is moderately enlarged. No  increase in right ventricular wall thickness. Right ventricular systolic  function is normal. There is normal pulmonary artery systolic pressure.  The tricuspid regurgitant velocity is  2.66 m/s, and with  She is alert and oriented to person, place, and time.     Comments: Distal sensation intact to light touch all extremities  Psychiatric:        Mood and Affect: Mood normal.        Behavior: Behavior normal.        Thought Content: Thought content normal.     Comments: Well groomed, good  eye contact, normal speech and thoughts      I have personally reviewed the radiology report from 12/09/19 ECHO ECHOCARDIOGRAM REPORT       Patient Name:   JNAI FAUVER Date of Exam: 12/09/2019  Medical Rec #:  [id number]      Height:       64.0 in  Accession #:    [id number]     Weight:       168.0 lb  Date of Birth:  10-17-1945      BSA:          1.817 m  Patient Age:    74 years       BP:           158/80 mmHg  Patient Gender: F              HR:           68 bpm.  Exam Location:  [place]   Procedure: 2D Echo, Cardiac Doppler and Color Doppler   Indications:    R01.1 Murmur; R42 Lightheaded    History:        Patient has no prior history of Echocardiogram  examinations.                 Signs/Symptoms:Murmur and Dizziness/Lightheadedness; Risk                  Factors:Hypertension, Dyslipidemia, Former Smoker and h/o                  rheumatic fever. GERD.    Sonographer:    Quentin Ore RDMS, RVT, RDCS  Referring Phys: [id number] BRIAN AGBOR-ETANG   IMPRESSIONS     1. Left ventricular ejection fraction, by estimation, is 60 to 65%. The  left ventricle has normal function. The left ventricle has no regional  wall motion abnormalities. There is mild left ventricular hypertrophy.  Left ventricular diastolic parameters  are consistent with Grade I diastolic dysfunction (impaired relaxation).   2. Right ventricular systolic function is normal. The right ventricular  size is moderately enlarged. There is normal pulmonary artery systolic  pressure.   3. The mitral valve is normal in structure. Trivial mitral valve  regurgitation.   4. The aortic valve is tricuspid. Aortic valve regurgitation is mild.   5. The inferior vena cava is normal in size with greater than 50%  respiratory variability, suggesting right atrial pressure of 3 mmHg.   FINDINGS   Left Ventricle: Left ventricular ejection fraction, by estimation, is 60  to 65%. The left ventricle has normal function. The  left ventricle has no  regional wall motion abnormalities. The left ventricular internal cavity  size was normal in size. There is   mild left ventricular hypertrophy. Left ventricular diastolic parameters  are consistent with Grade I diastolic dysfunction (impaired relaxation).   Right Ventricle: The right ventricular size is moderately enlarged. No  increase in right ventricular wall thickness. Right ventricular systolic  function is normal. There is normal pulmonary artery systolic pressure.  The tricuspid regurgitant velocity is  2.66 m/s, and with  She is alert and oriented to person, place, and time.     Comments: Distal sensation intact to light touch all extremities  Psychiatric:        Mood and Affect: Mood normal.        Behavior: Behavior normal.        Thought Content: Thought content normal.     Comments: Well groomed, good  eye contact, normal speech and thoughts      I have personally reviewed the radiology report from 12/09/19 ECHO ECHOCARDIOGRAM REPORT       Patient Name:   JNAI FAUVER Date of Exam: 12/09/2019  Medical Rec #:  [id number]      Height:       64.0 in  Accession #:    [id number]     Weight:       168.0 lb  Date of Birth:  10-17-1945      BSA:          1.817 m  Patient Age:    74 years       BP:           158/80 mmHg  Patient Gender: F              HR:           68 bpm.  Exam Location:  [place]   Procedure: 2D Echo, Cardiac Doppler and Color Doppler   Indications:    R01.1 Murmur; R42 Lightheaded    History:        Patient has no prior history of Echocardiogram  examinations.                 Signs/Symptoms:Murmur and Dizziness/Lightheadedness; Risk                  Factors:Hypertension, Dyslipidemia, Former Smoker and h/o                  rheumatic fever. GERD.    Sonographer:    Quentin Ore RDMS, RVT, RDCS  Referring Phys: [id number] BRIAN AGBOR-ETANG   IMPRESSIONS     1. Left ventricular ejection fraction, by estimation, is 60 to 65%. The  left ventricle has normal function. The left ventricle has no regional  wall motion abnormalities. There is mild left ventricular hypertrophy.  Left ventricular diastolic parameters  are consistent with Grade I diastolic dysfunction (impaired relaxation).   2. Right ventricular systolic function is normal. The right ventricular  size is moderately enlarged. There is normal pulmonary artery systolic  pressure.   3. The mitral valve is normal in structure. Trivial mitral valve  regurgitation.   4. The aortic valve is tricuspid. Aortic valve regurgitation is mild.   5. The inferior vena cava is normal in size with greater than 50%  respiratory variability, suggesting right atrial pressure of 3 mmHg.   FINDINGS   Left Ventricle: Left ventricular ejection fraction, by estimation, is 60  to 65%. The left ventricle has normal function. The  left ventricle has no  regional wall motion abnormalities. The left ventricular internal cavity  size was normal in size. There is   mild left ventricular hypertrophy. Left ventricular diastolic parameters  are consistent with Grade I diastolic dysfunction (impaired relaxation).   Right Ventricle: The right ventricular size is moderately enlarged. No  increase in right ventricular wall thickness. Right ventricular systolic  function is normal. There is normal pulmonary artery systolic pressure.  The tricuspid regurgitant velocity is  2.66 m/s, and with  She is alert and oriented to person, place, and time.     Comments: Distal sensation intact to light touch all extremities  Psychiatric:        Mood and Affect: Mood normal.        Behavior: Behavior normal.        Thought Content: Thought content normal.     Comments: Well groomed, good  eye contact, normal speech and thoughts      I have personally reviewed the radiology report from 12/09/19 ECHO ECHOCARDIOGRAM REPORT       Patient Name:   JNAI FAUVER Date of Exam: 12/09/2019  Medical Rec #:  [id number]      Height:       64.0 in  Accession #:    [id number]     Weight:       168.0 lb  Date of Birth:  10-17-1945      BSA:          1.817 m  Patient Age:    74 years       BP:           158/80 mmHg  Patient Gender: F              HR:           68 bpm.  Exam Location:  [place]   Procedure: 2D Echo, Cardiac Doppler and Color Doppler   Indications:    R01.1 Murmur; R42 Lightheaded    History:        Patient has no prior history of Echocardiogram  examinations.                 Signs/Symptoms:Murmur and Dizziness/Lightheadedness; Risk                  Factors:Hypertension, Dyslipidemia, Former Smoker and h/o                  rheumatic fever. GERD.    Sonographer:    Quentin Ore RDMS, RVT, RDCS  Referring Phys: [id number] BRIAN AGBOR-ETANG   IMPRESSIONS     1. Left ventricular ejection fraction, by estimation, is 60 to 65%. The  left ventricle has normal function. The left ventricle has no regional  wall motion abnormalities. There is mild left ventricular hypertrophy.  Left ventricular diastolic parameters  are consistent with Grade I diastolic dysfunction (impaired relaxation).   2. Right ventricular systolic function is normal. The right ventricular  size is moderately enlarged. There is normal pulmonary artery systolic  pressure.   3. The mitral valve is normal in structure. Trivial mitral valve  regurgitation.   4. The aortic valve is tricuspid. Aortic valve regurgitation is mild.   5. The inferior vena cava is normal in size with greater than 50%  respiratory variability, suggesting right atrial pressure of 3 mmHg.   FINDINGS   Left Ventricle: Left ventricular ejection fraction, by estimation, is 60  to 65%. The left ventricle has normal function. The  left ventricle has no  regional wall motion abnormalities. The left ventricular internal cavity  size was normal in size. There is   mild left ventricular hypertrophy. Left ventricular diastolic parameters  are consistent with Grade I diastolic dysfunction (impaired relaxation).   Right Ventricle: The right ventricular size is moderately enlarged. No  increase in right ventricular wall thickness. Right ventricular systolic  function is normal. There is normal pulmonary artery systolic pressure.  The tricuspid regurgitant velocity is  2.66 m/s, and with  Subjective:    Patient ID: Vanessa Reynolds, female    DOB: [birth year], 77 y.o.   MRN: [id number]  Vanessa Reynolds is a 77 y.o. female presenting on 03/19/2023 for Annual Exam   HPI  Here for Annual Physical and Fasting Labs  Discussed the use of AI scribe software for clinical note transcription with the patient, who gave verbal consent to proceed.     CHRONIC HTN Elevated BP now recently. Home Bp not checking as often but she can monitor it more. Admits salt and stress can raise BP She is taking Lisinopril 40mg  daily.  Reports good compliance, took meds today. Tolerating well, w/o complaints. Denies CP, dyspnea, HA, edema / lightheadedness   History of Murmur History of Rheumatic Fever Mixed history of consistency with hearing murmur. Prior doctor advised to monitor in future. She has had ECHO, last done 2021. Asymptomatic.  Meningioma Followed by Neurosurgery Dr Myer Haff for Meningioma surveillance. Now no recent follow-up. On surveillance if she has problem.  HYPERLIPIDEMIA: - Reports no concerns. Last lipid panel 2023, due for lab - Currently taking Lovastatin 20mg  daily, tolerating well without side effects or myalgias   Atherosclerosis Aorta On imaging CT 2021 On statin therapy   GERD The patient also mentions that her medication for acid reflux doesn't seem to be working as effectively as it should. She takes it once a day, usually before supper or in the morning if she doesn't feel nauseated. She plans to continue with the current batch of medication before considering a dosage adjustment.  Vitamin D Lab controlled in range previously On Supplement   Vitamin B12 Deficiency Continues on injection therapy  Due for lab  Flu Shot today  UTD PNEUMONIA vaccine   Future Shingles vaccine when ready.   Colonoscopy updated 12/2019, next due 5 years, 2026.       03/19/2023    9:18 AM 09/09/2022    1:13 PM 02/28/2022   10:14 AM  Depression screen PHQ 2/9   Decreased Interest 0 1 0  Down, Depressed, Hopeless 0 1 0  PHQ - 2 Score 0 2 0  Altered sleeping 1 2 0  Tired, decreased energy 0 0 0  Change in appetite 0 0 0  Feeling bad or failure about yourself  0 0 0  Trouble concentrating 0 1 0  Moving slowly or fidgety/restless 0 0 0  Suicidal thoughts 0 0 0  PHQ-9 Score 1 5 0  Difficult doing work/chores Not difficult at all  Not difficult at all    Past Medical History:  Diagnosis Date   Abdominal pain, left lower quadrant 2012   Allergy    Breast screening, unspecified    H/O cystitis 2011   History of colon cancer    Hyperlipidemia    Hypertension    Nausea with vomiting    Personal history of malignant neoplasm of large intestine 2013   Personal history of tobacco use, presenting hazards to health    Rectal cancer (HCC) 04/2010   Adenocarcinoma arising in a villous adenoma, PT 1, N0.   Rectal cancer [facility])    Past Surgical History:  Procedure Laterality Date   APPENDECTOMY  December 2011   BILATERAL OOPHORECTOMY  December 2011   Completed at time of low anterior resection.   COLON SURGERY  03/14/10   lap assisted low anterior resection-adenocarcinoma of the rectum and a villous adenoma   COLONOSCOPY  2011,2012,2015   Dr. Mechele Collin, Dr. Santo Held)   COLONOSCOPY WITH PROPOFOL

## 2023-03-19 NOTE — Assessment & Plan Note (Signed)
Controlled HTN, mild elevated initial repeat improved No complications Failed Amlodipine 5mg  intolerance, off HCTZ    Plan:  1. Continue current BP meds - Lisinopril 40mg  daily 2. Lifestyle Mods - continue try to improve regular exercise, low sodium diet 3. Contiue Monitor BP at home - goal to keep log and contact us sooner if persistent elevated BP >140

## 2023-03-19 NOTE — Assessment & Plan Note (Signed)
On imaging Taking Statin

## 2023-03-20 LAB — CBC WITH DIFFERENTIAL/PLATELET
Absolute Lymphocytes: 1489 {cells}/uL (ref 850–3900)
Absolute Monocytes: 334 {cells}/uL (ref 200–950)
Basophils Absolute: 32 {cells}/uL (ref 0–200)
Basophils Relative: 0.6 %
Eosinophils Absolute: 170 {cells}/uL (ref 15–500)
Eosinophils Relative: 3.2 %
HCT: 39.8 % (ref 35.0–45.0)
Hemoglobin: 12.9 g/dL (ref 11.7–15.5)
MCH: 30.6 pg (ref 27.0–33.0)
MCHC: 32.4 g/dL (ref 32.0–36.0)
MCV: 94.3 fL (ref 80.0–100.0)
MPV: 12.5 fL (ref 7.5–12.5)
Monocytes Relative: 6.3 %
Neutro Abs: 3275 {cells}/uL (ref 1500–7800)
Neutrophils Relative %: 61.8 %
Platelets: 153 10*3/uL (ref 140–400)
RBC: 4.22 10*6/uL (ref 3.80–5.10)
RDW: 13.2 % (ref 11.0–15.0)
Total Lymphocyte: 28.1 %
WBC: 5.3 10*3/uL (ref 3.8–10.8)

## 2023-03-20 LAB — VITAMIN D 25 HYDROXY (VIT D DEFICIENCY, FRACTURES): Vit D, 25-Hydroxy: 61 ng/mL (ref 30–100)

## 2023-03-20 LAB — COMPLETE METABOLIC PANEL WITH GFR
AG Ratio: 1 (calc) (ref 1.0–2.5)
ALT: 21 U/L (ref 6–29)
AST: 31 U/L (ref 10–35)
Albumin: 3.8 g/dL (ref 3.6–5.1)
Alkaline phosphatase (APISO): 321 U/L — ABNORMAL HIGH (ref 37–153)
BUN: 13 mg/dL (ref 7–25)
CO2: 28 mmol/L (ref 20–32)
Calcium: 9.6 mg/dL (ref 8.6–10.4)
Chloride: 105 mmol/L (ref 98–110)
Creat: 0.83 mg/dL (ref 0.60–1.00)
Globulin: 3.7 g/dL (ref 1.9–3.7)
Glucose, Bld: 85 mg/dL (ref 65–99)
Potassium: 4 mmol/L (ref 3.5–5.3)
Sodium: 141 mmol/L (ref 135–146)
Total Bilirubin: 0.6 mg/dL (ref 0.2–1.2)
Total Protein: 7.5 g/dL (ref 6.1–8.1)
eGFR: 72 mL/min/{1.73_m2} (ref >=60)

## 2023-03-20 LAB — LIPID PANEL
Cholesterol: 225 mg/dL — ABNORMAL HIGH (ref <200)
HDL: 80 mg/dL (ref >=50)
LDL Cholesterol (Calc): 130 mg/dL — ABNORMAL HIGH
Non-HDL Cholesterol (Calc): 145 mg/dL — ABNORMAL HIGH (ref <130)
Total CHOL/HDL Ratio: 2.8 (calc) (ref <5.0)
Triglycerides: 49 mg/dL (ref <150)

## 2023-03-20 LAB — TSH: TSH: 0.95 m[IU]/L (ref 0.40–4.50)

## 2023-03-20 LAB — VITAMIN B12: Vitamin B-12: 712 pg/mL (ref 200–1100)

## 2023-03-20 LAB — HEMOGLOBIN A1C
Hgb A1c MFr Bld: 5.4 %{Hb} (ref <5.7)
Mean Plasma Glucose: 108 mg/dL
eAG (mmol/L): 6 mmol/L

## 2023-03-24 ENCOUNTER — Encounter: Payer: Self-pay | Admitting: Family Medicine

## 2023-06-19 ENCOUNTER — Other Ambulatory Visit: Payer: Self-pay | Admitting: Family Medicine

## 2023-06-19 DIAGNOSIS — I10 Essential (primary) hypertension: Secondary | ICD-10-CM

## 2023-06-19 DIAGNOSIS — E7849 Other hyperlipidemia: Secondary | ICD-10-CM

## 2023-06-20 NOTE — Telephone Encounter (Signed)
Requested Prescriptions  Refused Prescriptions Disp Refills   lovastatin (MEVACOR) 20 MG tablet [Pharmacy Med Name: LOVASTATIN 20 MG TAB] 90 tablet 3    Sig: TAKE 1 TABLET BY MOUTH AT BEDTIME     Cardiovascular:  Antilipid - Statins 2 Failed - 06/20/2023  9:56 AM      Failed - Lipid Panel in normal range within the last 12 months    Cholesterol, Total  Date Value Ref Range Status  02/20/2015 198 100 - 199 mg/dL Final   Cholesterol  Date Value Ref Range Status  03/19/2023 225 (H) <200 mg/dL Final   LDL Cholesterol (Calc)  Date Value Ref Range Status  03/19/2023 130 (H) mg/dL (calc) Final    Comment:    Reference range: <100 . Desirable range <100 mg/dL for primary prevention;   <70 mg/dL for patients with CHD or diabetic patients  with > or = 2 CHD risk factors. Marland Kitchen LDL-C is now calculated using the Martin-Hopkins  calculation, which is a validated novel method providing  better accuracy than the Friedewald equation in the  estimation of LDL-C.  Horald Pollen et al. Lenox Ahr. 6045;409(81): 2061-2068  (http://education.[facility].com/faq/FAQ164)    HDL  Date Value Ref Range Status  03/19/2023 80 > OR = 50 mg/dL Final  19/14/7829 62 >56 mg/dL Final    Comment:    According to ATP-III Guidelines, HDL-C >59 mg/dL is considered a negative risk factor for CHD.    Triglycerides  Date Value Ref Range Status  03/19/2023 49 <150 mg/dL Final         Passed - Cr in normal range and within 360 days    Creat  Date Value Ref Range Status  03/19/2023 0.83 0.60 - 1.00 mg/dL Final         Passed - Patient is not pregnant      Passed - Valid encounter within last 12 months    Recent Outpatient Visits           3 months ago Annual physical exam   [place] [facility], Netta Neat, DO   6 months ago Upper respiratory tract infection, unspecified type   [facility] [facility] M[facility], Oswaldo Conroy, PA-C   6 months ago Encounter for  hepatitis C screening test for low risk patient   [facility] [place], Salvadore Oxford, NP   9 months ago Essential hypertension   [place] [facility] Smitty Cords, DO   10 months ago Dysuria   [place] [facility] Althea Charon, Netta Neat, DO       Future Appointments             In 9 months Althea Charon, Netta Neat, DO  [facility]             lisinopril (ZESTRIL) 40 MG tablet [Pharmacy Med Name: LISINOPRIL 40 MG TAB] 90 tablet 3    Sig: TAKE 1 TABLET BY MOUTH AT BEDTIME     Cardiovascular:  ACE Inhibitors Passed - 06/20/2023  9:56 AM      Passed - Cr in normal range and within 180 days    Creat  Date Value Ref Range Status  03/19/2023 0.83 0.60 - 1.00 mg/dL Final         Passed - K in normal range and within 180 days    Potassium  Date Value Ref Range Status  03/19/2023 4.0 3.5 - 5.3  mmol/L Final         Passed - Patient is not pregnant      Passed - Last BP in normal range    BP Readings from Last 1 Encounters:  03/19/23 130/64         Passed - Valid encounter within last 6 months    Recent Outpatient Visits           3 months ago Annual physical exam   [place] [facility] [place], Netta Neat, DO   6 months ago Upper respiratory tract infection, unspecified type   [place] [facility] Mecum, Oswaldo Conroy, PA-C   6 months ago Encounter for hepatitis C screening test for low risk patient   [facility] [place], Salvadore Oxford, NP   9 months ago Essential hypertension   [place] [facility] Smitty Cords, DO   10 months ago Dysuria   [place] [facility] Smitty Cords, DO       Future Appointments             In 9 months Althea Charon, Netta Neat, DO [place] [facility]

## 2023-07-25 ENCOUNTER — Inpatient Hospital Stay
Admission: EM | Admit: 2023-07-25 | Discharge: 2023-07-27 | DRG: 641 | Disposition: A | Discharge status: Home / Self Care | Attending: Osteopathic Medicine | Admitting: Osteopathic Medicine

## 2023-07-25 ENCOUNTER — Emergency Department

## 2023-07-25 ENCOUNTER — Other Ambulatory Visit: Payer: Self-pay

## 2023-07-25 ENCOUNTER — Observation Stay

## 2023-07-25 ENCOUNTER — Encounter: Payer: Self-pay | Admitting: Emergency Medicine

## 2023-07-25 ENCOUNTER — Observation Stay (HOSPITAL_COMMUNITY)
Admit: 2023-07-25 | Discharge: 2023-07-25 | Disposition: A | Discharge status: Home / Self Care | Attending: Family Medicine | Admitting: Family Medicine

## 2023-07-25 DIAGNOSIS — Z85048 Personal history of other malignant neoplasm of rectum, rectosigmoid junction, and anus: Secondary | ICD-10-CM

## 2023-07-25 DIAGNOSIS — Z800 Family history of malignant neoplasm of digestive organs: Secondary | ICD-10-CM | POA: Diagnosis not present

## 2023-07-25 DIAGNOSIS — M79605 Pain in left leg: Secondary | ICD-10-CM | POA: Diagnosis not present

## 2023-07-25 DIAGNOSIS — Z87891 Personal history of nicotine dependence: Secondary | ICD-10-CM

## 2023-07-25 DIAGNOSIS — J439 Emphysema, unspecified: Secondary | ICD-10-CM | POA: Diagnosis not present

## 2023-07-25 DIAGNOSIS — E785 Hyperlipidemia, unspecified: Secondary | ICD-10-CM | POA: Insufficient documentation

## 2023-07-25 DIAGNOSIS — R55 Syncope and collapse: Principal | ICD-10-CM | POA: Diagnosis present

## 2023-07-25 DIAGNOSIS — R112 Nausea with vomiting, unspecified: Secondary | ICD-10-CM | POA: Diagnosis not present

## 2023-07-25 DIAGNOSIS — I498 Other specified cardiac arrhythmias: Secondary | ICD-10-CM | POA: Diagnosis present

## 2023-07-25 DIAGNOSIS — R748 Abnormal levels of other serum enzymes: Secondary | ICD-10-CM | POA: Diagnosis present

## 2023-07-25 DIAGNOSIS — I6523 Occlusion and stenosis of bilateral carotid arteries: Secondary | ICD-10-CM | POA: Diagnosis not present

## 2023-07-25 DIAGNOSIS — R110 Nausea: Secondary | ICD-10-CM | POA: Diagnosis not present

## 2023-07-25 DIAGNOSIS — I700 Atherosclerosis of aorta: Secondary | ICD-10-CM | POA: Diagnosis not present

## 2023-07-25 DIAGNOSIS — Z91018 Allergy to other foods: Secondary | ICD-10-CM

## 2023-07-25 DIAGNOSIS — Z8249 Family history of ischemic heart disease and other diseases of the circulatory system: Secondary | ICD-10-CM | POA: Diagnosis not present

## 2023-07-25 DIAGNOSIS — K529 Noninfective gastroenteritis and colitis, unspecified: Secondary | ICD-10-CM | POA: Diagnosis present

## 2023-07-25 DIAGNOSIS — I10 Essential (primary) hypertension: Secondary | ICD-10-CM | POA: Diagnosis present

## 2023-07-25 DIAGNOSIS — S0990XA Unspecified injury of head, initial encounter: Secondary | ICD-10-CM | POA: Diagnosis not present

## 2023-07-25 DIAGNOSIS — R7989 Other specified abnormal findings of blood chemistry: Secondary | ICD-10-CM | POA: Diagnosis not present

## 2023-07-25 DIAGNOSIS — K7460 Unspecified cirrhosis of liver: Secondary | ICD-10-CM | POA: Diagnosis not present

## 2023-07-25 DIAGNOSIS — I959 Hypotension, unspecified: Secondary | ICD-10-CM | POA: Diagnosis not present

## 2023-07-25 DIAGNOSIS — R188 Other ascites: Secondary | ICD-10-CM | POA: Diagnosis not present

## 2023-07-25 DIAGNOSIS — K5900 Constipation, unspecified: Secondary | ICD-10-CM | POA: Diagnosis present

## 2023-07-25 DIAGNOSIS — K219 Gastro-esophageal reflux disease without esophagitis: Secondary | ICD-10-CM | POA: Insufficient documentation

## 2023-07-25 DIAGNOSIS — Z86011 Personal history of benign neoplasm of the brain: Secondary | ICD-10-CM | POA: Diagnosis not present

## 2023-07-25 DIAGNOSIS — R778 Other specified abnormalities of plasma proteins: Secondary | ICD-10-CM | POA: Diagnosis not present

## 2023-07-25 DIAGNOSIS — J9811 Atelectasis: Secondary | ICD-10-CM | POA: Diagnosis not present

## 2023-07-25 DIAGNOSIS — R1084 Generalized abdominal pain: Secondary | ICD-10-CM | POA: Diagnosis not present

## 2023-07-25 DIAGNOSIS — E860 Dehydration: Secondary | ICD-10-CM | POA: Diagnosis present

## 2023-07-25 LAB — CBC
HCT: 37.4 % (ref 36.0–46.0)
HCT: 38.2 % (ref 36.0–46.0)
Hemoglobin: 12.4 g/dL (ref 12.0–15.0)
Hemoglobin: 12.6 g/dL (ref 12.0–15.0)
MCH: 30.9 pg (ref 26.0–34.0)
MCH: 30.6 pg (ref 26.0–34.0)
MCHC: 33.2 g/dL (ref 30.0–36.0)
MCHC: 33 g/dL (ref 30.0–36.0)
MCV: 93.3 fL (ref 80.0–100.0)
MCV: 92.7 fL (ref 80.0–100.0)
Platelets: 144 10*3/uL — ABNORMAL LOW (ref 150–400)
Platelets: 147 10*3/uL — ABNORMAL LOW (ref 150–400)
RBC: 4.01 MIL/uL (ref 3.87–5.11)
RBC: 4.12 MIL/uL (ref 3.87–5.11)
RDW: 12.7 % (ref 11.5–15.5)
RDW: 12.8 % (ref 11.5–15.5)
WBC: 13.6 10*3/uL — ABNORMAL HIGH (ref 4.0–10.5)
WBC: 15.1 10*3/uL — ABNORMAL HIGH (ref 4.0–10.5)
nRBC: 0 % (ref 0.0–0.2)
nRBC: 0 % (ref 0.0–0.2)

## 2023-07-25 LAB — CBC WITH DIFFERENTIAL/PLATELET
Abs Immature Granulocytes: 0.05 10*3/uL (ref 0.00–0.07)
Basophils Absolute: 0.1 10*3/uL (ref 0.0–0.1)
Basophils Relative: 1 %
Eosinophils Absolute: 0.2 10*3/uL (ref 0.0–0.5)
Eosinophils Relative: 2 %
HCT: 38.6 % (ref 36.0–46.0)
Hemoglobin: 12.8 g/dL (ref 12.0–15.0)
Immature Granulocytes: 1 %
Lymphocytes Relative: 14 %
Lymphs Abs: 1.5 10*3/uL (ref 0.7–4.0)
MCH: 31.1 pg (ref 26.0–34.0)
MCHC: 33.2 g/dL (ref 30.0–36.0)
MCV: 93.9 fL (ref 80.0–100.0)
Monocytes Absolute: 0.4 10*3/uL (ref 0.1–1.0)
Monocytes Relative: 4 %
Neutro Abs: 8.4 10*3/uL — ABNORMAL HIGH (ref 1.7–7.7)
Neutrophils Relative %: 78 %
Platelets: 158 10*3/uL (ref 150–400)
RBC: 4.11 MIL/uL (ref 3.87–5.11)
RDW: 12.8 % (ref 11.5–15.5)
WBC: 10.5 10*3/uL (ref 4.0–10.5)
nRBC: 0 % (ref 0.0–0.2)

## 2023-07-25 LAB — ECHOCARDIOGRAM COMPLETE
AR max vel: 2.31 cm2
AV Area VTI: 2.57 cm2
AV Area mean vel: 2.34 cm2
AV Mean grad: 3 mmHg
AV Peak grad: 6.8 mmHg
Ao pk vel: 1.3 m/s
Area-P 1/2: 3.19 cm2
MV VTI: 3.25 cm2
S' Lateral: 2.5 cm

## 2023-07-25 LAB — URINALYSIS, W/ REFLEX TO CULTURE (INFECTION SUSPECTED)
Bilirubin Urine: NEGATIVE
Glucose, UA: NEGATIVE mg/dL
Hgb urine dipstick: NEGATIVE
Ketones, ur: NEGATIVE mg/dL
Leukocytes,Ua: NEGATIVE
Nitrite: NEGATIVE
Protein, ur: NEGATIVE mg/dL
RBC / HPF: 0 RBC/hpf (ref 0–5)
Specific Gravity, Urine: 1.006 (ref 1.005–1.030)
pH: 5 (ref 5.0–8.0)

## 2023-07-25 LAB — COMPREHENSIVE METABOLIC PANEL
ALT: 31 U/L (ref 0–44)
AST: 40 U/L (ref 15–41)
Albumin: 3.4 g/dL — ABNORMAL LOW (ref 3.5–5.0)
Alkaline Phosphatase: 275 U/L — ABNORMAL HIGH (ref 38–126)
Anion gap: 9 (ref 5–15)
BUN: 20 mg/dL (ref 8–23)
CO2: 24 mmol/L (ref 22–32)
Calcium: 9.7 mg/dL (ref 8.9–10.3)
Chloride: 107 mmol/L (ref 98–111)
Creatinine, Ser: 0.98 mg/dL (ref 0.44–1.00)
GFR, Estimated: 59 mL/min — ABNORMAL LOW (ref >60)
Glucose, Bld: 132 mg/dL — ABNORMAL HIGH (ref 70–99)
Potassium: 3.7 mmol/L (ref 3.5–5.1)
Sodium: 140 mmol/L (ref 135–145)
Total Bilirubin: 0.7 mg/dL (ref 0.0–1.2)
Total Protein: 7.4 g/dL (ref 6.5–8.1)

## 2023-07-25 LAB — BASIC METABOLIC PANEL
Anion gap: 8 (ref 5–15)
BUN: 20 mg/dL (ref 8–23)
CO2: 23 mmol/L (ref 22–32)
Calcium: 8.7 mg/dL — ABNORMAL LOW (ref 8.9–10.3)
Chloride: 108 mmol/L (ref 98–111)
Creatinine, Ser: 0.84 mg/dL (ref 0.44–1.00)
GFR, Estimated: >60 mL/min (ref >60)
Glucose, Bld: 139 mg/dL — ABNORMAL HIGH (ref 70–99)
Potassium: 3.5 mmol/L (ref 3.5–5.1)
Sodium: 139 mmol/L (ref 135–145)

## 2023-07-25 LAB — D-DIMER, QUANTITATIVE: D-Dimer, Quant: 12.03 ug{FEU}/mL — ABNORMAL HIGH (ref 0.00–0.50)

## 2023-07-25 LAB — MAGNESIUM: Magnesium: 2.1 mg/dL (ref 1.7–2.4)

## 2023-07-25 LAB — TROPONIN I (HIGH SENSITIVITY)
Troponin I (High Sensitivity): 14 ng/L (ref <18)
Troponin I (High Sensitivity): 27 ng/L — ABNORMAL HIGH (ref <18)
Troponin I (High Sensitivity): 36 ng/L — ABNORMAL HIGH (ref <18)
Troponin I (High Sensitivity): 31 ng/L — ABNORMAL HIGH (ref <18)

## 2023-07-25 MED ORDER — ENOXAPARIN SODIUM 40 MG/0.4ML IJ SOSY
40.0000 mg | PREFILLED_SYRINGE | INTRAMUSCULAR | Status: DC
  Filled 2023-07-25: qty 0.4

## 2023-07-25 MED ORDER — IOHEXOL 350 MG/ML SOLN
75.0000 mL | Freq: Once | INTRAVENOUS | Status: AC | PRN
  Administered 2023-07-25: 75 mL via INTRAVENOUS

## 2023-07-25 MED ORDER — POTASSIUM CHLORIDE 20 MEQ PO PACK
40.0000 meq | PACK | Freq: Once | ORAL | Status: AC
  Administered 2023-07-25: 40 meq via ORAL
  Filled 2023-07-25: qty 2

## 2023-07-25 MED ORDER — VITAMIN B-12 100 MCG PO TABS
100.0000 ug | ORAL_TABLET | Freq: Every day | ORAL | Status: DC
  Administered 2023-07-25 – 2023-07-27 (×3): 100 ug via ORAL
  Filled 2023-07-25 (×4): qty 1

## 2023-07-25 MED ORDER — ONDANSETRON HCL 4 MG PO TABS
4.0000 mg | ORAL_TABLET | Freq: Four times a day (QID) | ORAL | Status: DC | PRN
Start: 2023-07-25 — End: 2023-07-27

## 2023-07-25 MED ORDER — ACETAMINOPHEN 325 MG PO TABS: 650.0000 mg | ORAL_TABLET | Freq: Four times a day (QID) | ORAL | Status: DC | PRN

## 2023-07-25 MED ORDER — SODIUM CHLORIDE 0.9 % IV SOLN: INTRAVENOUS | Status: AC

## 2023-07-25 MED ORDER — ONDANSETRON HCL 4 MG/2ML IJ SOLN
4.0000 mg | Freq: Once | INTRAMUSCULAR | Status: AC
  Administered 2023-07-25: 4 mg via INTRAVENOUS
  Filled 2023-07-25: qty 2

## 2023-07-25 MED ORDER — ACETAMINOPHEN 650 MG RE SUPP: 650.0000 mg | Freq: Four times a day (QID) | RECTAL | Status: DC | PRN

## 2023-07-25 MED ORDER — LISINOPRIL 10 MG PO TABS: 40.0000 mg | ORAL_TABLET | Freq: Every day | ORAL | Status: DC

## 2023-07-25 MED ORDER — ONDANSETRON HCL 4 MG/2ML IJ SOLN
4.0000 mg | Freq: Four times a day (QID) | INTRAMUSCULAR | Status: DC | PRN
  Administered 2023-07-25: 4 mg via INTRAVENOUS
  Filled 2023-07-25: qty 2

## 2023-07-25 MED ORDER — PRAVASTATIN SODIUM 20 MG PO TABS
20.0000 mg | ORAL_TABLET | Freq: Every day | ORAL | Status: DC
  Administered 2023-07-25 – 2023-07-26 (×2): 20 mg via ORAL
  Filled 2023-07-25 (×4): qty 1

## 2023-07-25 MED ORDER — FLUTICASONE PROPIONATE 50 MCG/ACT NA SUSP: 2.0000 | Freq: Every day | NASAL | Status: DC | PRN

## 2023-07-25 MED ORDER — SODIUM CHLORIDE 0.9% FLUSH
3.0000 mL | Freq: Two times a day (BID) | INTRAVENOUS | Status: DC
  Administered 2023-07-25 – 2023-07-27 (×5): 3 mL via INTRAVENOUS

## 2023-07-25 MED ORDER — SODIUM CHLORIDE 0.9 % IV BOLUS
1000.0000 mL | Freq: Once | INTRAVENOUS | Status: AC
  Administered 2023-07-25: 1000 mL via INTRAVENOUS

## 2023-07-25 MED ORDER — MAGNESIUM HYDROXIDE 400 MG/5ML PO SUSP: 30.0000 mL | Freq: Every day | ORAL | Status: DC | PRN

## 2023-07-25 MED ORDER — TRAZODONE HCL 50 MG PO TABS: 25.0000 mg | ORAL_TABLET | Freq: Every evening | ORAL | Status: DC | PRN

## 2023-07-25 MED ORDER — PANTOPRAZOLE SODIUM 40 MG PO TBEC
40.0000 mg | DELAYED_RELEASE_TABLET | Freq: Every day | ORAL | Status: DC
  Administered 2023-07-25 – 2023-07-27 (×3): 40 mg via ORAL
  Filled 2023-07-25 (×3): qty 1

## 2023-07-25 NOTE — ED Notes (Signed)
 Pt completely soiled w/ diarrhea upon arrival to ER. Pt cleaned. Pt had multiple BM of diarrhea while being cleaned. New linen and brief placed.

## 2023-07-25 NOTE — Assessment & Plan Note (Signed)
 -  We will continue PPI therapy

## 2023-07-25 NOTE — ED Notes (Signed)
 Pt soiled brief and linen with stool. Pt cleaned and new brief placed at this time.

## 2023-07-25 NOTE — ED Notes (Signed)
 Pt soiled brief and linen w/ stool. Pt cleaned and new brief placed. Urine sample collect via straight cath.

## 2023-07-25 NOTE — Assessment & Plan Note (Signed)
-   We will continue antihypertensive therapy.

## 2023-07-25 NOTE — ED Notes (Signed)
 Mansy, MD at bedside

## 2023-07-25 NOTE — Assessment & Plan Note (Signed)
-  The patient will be admitted to an observation medically monitored bed. - Will check orthostatics q 12 hours. - Will obtain a bilateral carotid Doppler and 2D echo. - The patient will be gently hydrated with IV normal saline and monitored for arrhythmias. -Differential diagnoses would include neurally mediated syncope, cardiogenic, arrhythmias related,  orthostatic hypotension and less likely hypoglycemia.

## 2023-07-25 NOTE — ED Notes (Signed)
 ED Provider at bedside.

## 2023-07-25 NOTE — ED Notes (Signed)
 Pt brief changed at this time. Stool watery with bright red blood.

## 2023-07-25 NOTE — Assessment & Plan Note (Signed)
-   This was ordered given her urgency to defecate in the setting of elevated troponin and I given significant elevation in stat chest CTA was ordered to rule out PE. - I also ordered bilateral lower extremity venous Doppler to rule out DVT.

## 2023-07-25 NOTE — H&P (Addendum)
 [place]   PATIENT NAME: Vanessa Reynolds    MR#:  [id number]  DATE OF BIRTH:  [birth year]  DATE OF ADMISSION:  07/25/2023  PRIMARY CARE PHYSICIAN: Smitty Cords, DO   Patient is coming from: Home  REQUESTING/REFERRING PHYSICIAN: [name], MD  CHIEF COMPLAINT:   Chief Complaint  Patient presents with   Loss of Consciousness    Pt arrives via ems for syncopal episode. Pt up to bathroom and had syncopal episode while on commode. Denies hitting head. Pt reports taking laxatives yest. Evening.     HISTORY OF PRESENT ILLNESS:  Vanessa Reynolds is a 78 y.o. Caucasian female with medical history significant for hypertension, dyslipidemia and GERD, presented to the emergency room with acute onset of syncope while on her commode.  She tells me that her husband caught her and therefore had no falls or injuries.  She denies any paresthesias or focal muscle weakness.  No headache or dizziness or blurred vision.  No nausea or vomiting or abdominal pain.  She denies any recent travels or surgeries.  No leg pain or edema.  No fever or chills.  No chest pain or palpitations.  She denies any cough or wheezing or hemoptysis.  No other bleeding diathesis.  ED Course: When the patient came to the ER, BP was 104/56 with otherwise normal vital signs.  Labs revealed elevated alk phos of 275 with albumin 3.4 otherwise unremarkable CMP.  High sensitive troponin I was 14 and later 27 then 36.  CBC showed no abnormalities.  I recommended D-dimer and it came back elevated at 12.03.  UA came back negative.   EKG as reviewed by me : It showed an irregularly irregular rhythm concerning for atrial fibrillation with a rate of 90. Imaging: Noncontrasted CT scan revealed no acute intracranial normalities.  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  The patient was given 4 mg of IV Zofran and 1 L bolus of IV normal saline.  She will  be admitted to a cardiac telemetry observation bed for further evaluation and management. 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   Rectal cancer [facility])     PAST SURGICAL HISTORY:   Past Surgical History:  Procedure Laterality Date   APPENDECTOMY  December 2011   BILATERAL OOPHORECTOMY  December 2011   Completed at time of low anterior resection.   COLON SURGERY  03/14/10   lap assisted low anterior resection-adenocarcinoma of the rectum and a villous adenoma   COLONOSCOPY  2011,2012,2015   Dr. Mechele Collin, Dr. Santo Held)   COLONOSCOPY WITH PROPOFOL N/A 01/05/2020   Procedure: COLONOSCOPY WITH PROPOFOL;  Surgeon: Earline Mayotte, MD;  Location: [facility] ENDOSCOPY;  Service: Endoscopy;  Laterality: N/A;    SOCIAL HISTORY:   Social History   Tobacco Use   Smoking status: Former    Current packs/day: 0.00    Average packs/day: 2.0 packs/day for 30.0 years (  60.0 ttl pk-yrs)    Types: Cigarettes    Start date: 05/21/1967    Quit date: 05/20/1997    Years since quitting: 26.1   Smokeless tobacco: Former  Substance Use Topics   Alcohol use: No    FAMILY HISTORY:   Family History  Problem Relation Age of Onset   Hypertension Mother        deceased, age 7   Cancer Father        gastric cancer, deceased, mid 63's   Breast cancer Neg Hx     DRUG ALLERGIES:   Allergies  Allergen Reactions   Fish Oil Other (See Comments)    Disruptions to Balance    REVIEW OF SYSTEMS:   ROS As per history of present illness. All pertinent systems were  reviewed above. Constitutional, HEENT, cardiovascular, respiratory, GI, GU, musculoskeletal, neuro, psychiatric, endocrine, integumentary and hematologic systems were reviewed and are otherwise negative/unremarkable except for positive findings mentioned above in the HPI.   MEDICATIONS AT HOME:   Prior to Admission medications   Medication Sig Start Date End Date Taking? Authorizing Provider  fluticasone (FLONASE) 50 MCG/ACT nasal spray PLACE 2 SPRAYS INTO BOTH NOSTRILS DAILY.USE FOR 4-6 WEEKS THEN STOP AND USE SEASONALLY OR AS NEEDED 11/10/20  Yes Karamalegos, Netta Neat, DO  lisinopril (ZESTRIL) 40 MG tablet Take 1 tablet (40 mg total) by mouth at bedtime. 09/09/22  Yes Karamalegos, Netta Neat, DO  loratadine (CLARITIN) 10 MG tablet Take 1 tablet (10 mg total) by mouth daily. Use for 4-6 weeks then stop, and use as needed or seasonally 08/05/19  Yes Karamalegos, Netta Neat, DO  lovastatin (MEVACOR) 20 MG tablet Take 1 tablet (20 mg total) by mouth at bedtime. 09/09/22  Yes Karamalegos, Netta Neat, DO  omeprazole (PRILOSEC) 20 MG capsule Take 1 capsule (20 mg total) by mouth daily before breakfast. 09/09/22  Yes Karamalegos, Netta Neat, DO  vitamin B-12 (CYANOCOBALAMIN) 100 MCG tablet Take 100 mcg by mouth daily.   Yes [provider]      VITAL SIGNS:  Blood pressure (!) 125/52, pulse 68, temperature 98 F (36.7 C), temperature source Oral, resp. rate (!) 22, SpO2 96%.  PHYSICAL EXAMINATION:  Physical Exam  GENERAL:  78 y.o.-year-old patient lying in the bed with no acute distress.  EYES: Pupils equal, round, reactive to light and accommodation. No scleral icterus. Extraocular muscles intact.  HEENT: Head atraumatic, normocephalic. Oropharynx and nasopharynx clear.  NECK:  Supple, no jugular venous distention. No thyroid enlargement, no tenderness.  LUNGS: Normal breath sounds bilaterally, no wheezing, rales,rhonchi or crepitation. No use of accessory muscles of respiration.   CARDIOVASCULAR: Regular rate and rhythm, S1, S2 normal. No murmurs, rubs, or gallops.  ABDOMEN: Soft, nondistended, nontender. Bowel sounds present. No organomegaly or mass.  EXTREMITIES: No pedal edema, cyanosis, or clubbing.  NEUROLOGIC: Cranial nerves II through XII are intact. Muscle strength 5/5 in all extremities. Sensation intact. Gait not checked.  PSYCHIATRIC: The patient is alert and oriented x 3.  Normal affect and good eye contact. SKIN: No obvious rash, lesion, or ulcer.   LABORATORY PANEL:   CBC Recent Labs  Lab 07/25/23 0529  WBC 13.6*  HGB 12.4  HCT 37.4  PLT 144*   ------------------------------------------------------------------------------------------------------------------  Chemistries  Recent Labs  Lab 07/25/23 0058 07/25/23 0529  NA 140 139  K 3.7 3.5  CL 107 108  CO2 24 23  GLUCOSE 132* 139*  BUN 20 20  CREATININE 0.98 0.84  CALCIUM 9.7 8.7*  MG 2.1  --   AST 40  --   ALT 31  --   ALKPHOS 275*  --   BILITOT 0.7  --    ------------------------------------------------------------------------------------------------------------------  Cardiac Enzymes No results for input(s): "TROPONINI" in the last 168 hours. ------------------------------------------------------------------------------------------------------------------  RADIOLOGY:  CT Head Wo Contrast Result Date: 07/25/2023 CLINICAL DATA:  Head trauma, minor (Age >= 65y) EXAM: CT HEAD WITHOUT CONTRAST TECHNIQUE: Contiguous axial images were obtained from the base of the skull through the vertex without intravenous contrast. RADIATION DOSE REDUCTION: This exam was performed according to the departmental dose-optimization program which includes automated exposure control, adjustment of the mA and/or kV according to patient size and/or use of iterative reconstruction technique. COMPARISON:  MRI March 20, 2021. FINDINGS: Brain: No evidence of acute infarction, hemorrhage, hydrocephalus,  extra-axial collection or midline shift. Approximately 1 cm calcified extra-axial dural-based lesion along the anterior right falx, compatible with a meningioma. 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 Minimal mass effect on the adjacent brain. Without patchy white matter hypodensities, compatible with chronic microvascular ischemic change. Vascular: Calcific atherosclerosis.  No hyperdense vessel. Skull: No acute fracture. Sinuses/Orbits: Clear sinuses. IMPRESSION: 1. No evidence of acute intracranial abnormality. 2. Similar right anterior parafalcine meningioma. 3. 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 Electronically Signed   By: Feliberto Harts M.D.   On: 07/25/2023 02:59      IMPRESSION AND PLAN:  Assessment and Plan: * Syncope - The patient will be admitted to an observation medically monitored bed. - Will check orthostatics q 12 hours. - Will obtain a bilateral carotid Doppler and 2D echo. - The patient will be gently hydrated with IV normal saline and monitored for arrhythmias. -Differential diagnoses would include neurally mediated syncope, cardiogenic, arrhythmias related,  orthostatic hypotension and less likely hypoglycemia.    Elevated d-dimer - This was ordered given her urgency to defecate in the setting of elevated troponin and I given significant elevation in stat chest CTA was ordered to rule out PE. - I also ordered bilateral lower extremity venous Doppler to rule out DVT.  Sinus arrhythmia - This is significantly irregular to be concerning for atrial fibrillation. - I will obtain a repeat EKG - We will optimize electrolytes.  Essential hypertension - We will continue antihypertensive therapy.  GERD without esophagitis - We will continue PPI therapy.  Dyslipidemia - We will continue statin therapy.       DVT prophylaxis: Lovenox..  Advanced Care Planning:  Code Status: full code.  Family Communication:  The plan of care was  discussed in details with the patient (and family). I answered all questions. The patient agreed to proceed with the above mentioned plan. Further management will depend upon hospital course. Disposition Plan: Back to previous home environment Consults called: none.  All the records are reviewed and case discussed with ED provider.  Status is: Observation  I certify that at the time of admission, it is my clinical judgment that the patient will require hospital care extending less than 2 midnights.                            Dispo: The patient is from: Home              Anticipated d/c is to: Home              Patient currently is not medically stable to d/c.  Difficult to place patient: No  Hannah Beat M.D on 07/25/2023 at 7:04 AM  [facility]   From 7 PM-7 AM, contact night-coverage [email or web address]  CC: Primary care physician; Smitty Cords, DO

## 2023-07-25 NOTE — Discharge Instructions (Addendum)
 Thank you for choosing [place] for your health care today!  Please see your primary doctor this week for a follow up appointment.   If you have any new, worsening, or unexpected symptoms call your doctor right away or come back to the emergency department for reevaluation.  It was my pleasure to care for you today.   Daneil Dan [place] Charon, MD

## 2023-07-25 NOTE — ED Notes (Signed)
 Pt taken to CT.

## 2023-07-25 NOTE — ED Notes (Signed)
 Green and lav top sent to lab  Pt to radiology at this time

## 2023-07-25 NOTE — Progress Notes (Signed)
*  PRELIMINARY RESULTS* Echocardiogram 2D Echocardiogram has been performed.  Vanessa Reynolds 07/25/2023, 3:55 PM

## 2023-07-25 NOTE — ED Notes (Signed)
 Pt soiled brief w/ stool. Pt cleaned and new brief placed. Stool had blood tinged appearance. [place] Charon, MD made aware of this.   Husband left bedside at this time. Plans to return later today.

## 2023-07-25 NOTE — ED Provider Notes (Addendum)
 [name] Provider Note    Event Date/Time   First MD Initiated Contact with Patient 07/25/23 [phone]     (approximate)   History   Loss of Consciousness (Pt arrives via ems for syncopal episode. Pt up to bathroom and had syncopal episode while on commode. Denies hitting head. Pt reports taking laxatives yest. Evening. )   HPI  Vanessa Reynolds is a 78 y.o. female   Past medical history of hypertension hyperlipidemia, prior rectal cancer, meningioma, who presents to the Emergency Department with a syncopal episode.  She deals with constipation once in a while and took a laxative today because she felt constipated.  She then had the sensation of abdominal cramping and a sensation of nausea, needed to make a bowel movement so walked over to the toilet and was straining on the toilet and that she passed out.  Her husband found her and she awoke shortly thereafter.  No seizure-like activity reported.  The patient has had significant loose stool since then.  There is no GI bleeding.  She denies any palpitations shortness of breath chest pain just prior to the syncopal episode.  She has had syncopal episodes in the past while straining on the toilet.  Currently she has a sensation that she has been to defecate and is making liquid stools.  She denies abdominal pain.  She does continue to have some intermittent abdominal cramping sensations.  She denies any other pain or discomfort.     External Medical Documents Reviewed: Family medicine outpatient visit notes from October 2024 documented past medical history of hypertension, GERD, hyperlipidemia and medication list      Physical Exam   Triage Vital Signs: ED Triage Vitals  Encounter Vitals Group     BP      Systolic BP Percentile      Diastolic BP Percentile      Pulse      Resp      Temp      Temp src      SpO2      Weight      Height      Head Circumference      Peak Flow      Pain Score      Pain  Loc      Pain Education      Exclude from Growth Chart     Most recent vital signs: Vitals:   07/25/23 0330 07/25/23 0400  BP: (!) 147/73 (!) 125/52  Pulse: 69 68  Resp: (!) 22   Temp:    SpO2: 96% 96%    General: Awake, no distress.  CV:  Good peripheral perfusion.  Resp:  Normal effort.  Abd:  No distention.  Other:  Awake alert comfortable appearing.  Speech is somewhat slurred but she states that this is chronic and unchanged due to her dentures.  She is able to range all extremities with full active range of motion is sensory intact throughout.  She has no facial asymmetry.  No obvious signs of external head trauma.  Her neck is supple with full range of motion.  She has a nondistended abdomen which is soft and nontender to palpation all quadrants without any masses.  She appears euvolemic overall, her vital signs are unremarkable.   ED Results / Procedures / Treatments   Labs (all labs ordered are listed, but only abnormal results are displayed) Labs Reviewed  COMPREHENSIVE METABOLIC PANEL - Abnormal; Notable for the following components:  Result Value   Glucose, Bld 132 (*)    Albumin 3.4 (*)    Alkaline Phosphatase 275 (*)    GFR, Estimated 59 (*)    All other components within normal limits  CBC WITH DIFFERENTIAL/PLATELET - Abnormal; Notable for the following components:   Neutro Abs 8.4 (*)    All other components within normal limits  URINALYSIS, W/ REFLEX TO CULTURE (INFECTION SUSPECTED) - Abnormal; Notable for the following components:   Color, Urine YELLOW (*)    APPearance CLEAR (*)    Bacteria, UA RARE (*)    All other components within normal limits  TROPONIN I (HIGH SENSITIVITY) - Abnormal; Notable for the following components:   Troponin I (High Sensitivity) 27 (*)    All other components within normal limits  TROPONIN I (HIGH SENSITIVITY) - Abnormal; Notable for the following components:   Troponin I (High Sensitivity) 36 (*)    All other  components within normal limits  MAGNESIUM  TROPONIN I (HIGH SENSITIVITY)     I ordered and reviewed the above labs they are notable for cell counts unremarkable.  EKG  ED ECG REPORT I, [name], the attending physician, personally viewed and interpreted this ECG.   Date: 07/25/2023  EKG Time: 0052  Rate: 90  Rhythm: sinus arrhythmia  (discernible p waves seen readily in v1 though irregular)   Axis: nl  Intervals:none  ST&T Change: no stemi    RADIOLOGY I independently reviewed and interpreted CT of the head and see no obvious bleeding or midline shift I also reviewed radiologist's formal read.   PROCEDURES:  Critical Care performed: No  Procedures   MEDICATIONS ORDERED IN ED: Medications  sodium chloride 0.9 % bolus 1,000 mL (has no administration in time range)  ondansetron (ZOFRAN) injection 4 mg (has no administration in time range)    IMPRESSION / MDM / ASSESSMENT AND PLAN / ED COURSE  I reviewed the triage vital signs and the nursing notes.                                Patient's presentation is most consistent with acute presentation with potential threat to life or bodily function.  Differential diagnosis includes, but is not limited to, vasovagal syncope, head trauma, dysrhythmia or electrolyte disturbance, intra-abdominal infection  The patient is on the cardiac monitor to evaluate for evidence of arrhythmia and/or significant heart rate changes.  MDM:    Well-appearing patient now after what was likely a vasovagal syncopal episode while on the toilet straining to make a bowel movement after taking a laxative.     She has a history of vasovagal syncopal episodes while on the toilet straining for bowel movement in the past.   Tonight may have been due to the straining or the abdominal cramping after taking her laxative.  She has no GI bleeding and her cell counts are normal.  She has a benign abdominal exam so I doubt surgical abdominal  pathologies.    She has no focal neurologic deficits to suggest stroke.   She denies any chest pain and doubt ACS especially with nonischemic EKG.   Low clinical suspicion for PE given no shortness of breath, chest pain, the more likely explanation of vasovagal syncope in the context of abdominal pain/diarrhea/straining on the toilet.  She does not look markedly dehydrated.  Check basic labs including troponin, electrolytes, check head scan given unknown head trauma    ----  Continues to have profuse watery diarrhea.  Feels nauseated.  I ordered for some fluids through the IV and antiemetic.  Her troponin increased from 15 and 1 hour later to the 20s and another hour later to the 30s.  Given her increasing troponin, syncope, I think she should be admitted on cardiopulmonary monitoring and trending of troponin.  She has no chest pain and so I doubt this is due to primary cardiac ischemia more likely demand in the setting of GI illness/diarrhea.     FINAL CLINICAL IMPRESSION(S) / ED DIAGNOSES   Final diagnoses:  Vasovagal syncope  Troponin level elevated  Nausea     Rx / DC Orders   ED Discharge Orders     None        Note:  This document was prepared using Dragon voice recognition software and may include unintentional dictation errors.    [name], MD 07/25/23 1610    [name], MD 07/25/23 9604    [name], MD 07/25/23 5409    [name], MD 07/25/23 0700

## 2023-07-25 NOTE — Hospital Course (Addendum)
 Hospital course / significant events:   HPI: She deals with constipation once in a while and took a laxative today because she felt constipated. She then had the sensation of abdominal cramping and a sensation of nausea, needed to make a bowel movement so walked over to the toilet and was straining on the toilet and that she passed out. Her husband found her and she awoke shortly thereafter. No seizure-like activity reported. The patient has had significant loose stool since then. At that time reports no GI bleeding. She denies any palpitations shortness of breath chest pain just prior to the syncopal episode. She has had syncopal episodes in the past while straining on the toilet.  03/07: admitted to hospitalist this morning. RN relayed concern for dark stool reported by techs - CBC stable. Pt has not had BM since. GI PCR and CDiff ordered. Troponin have been flat. WBC up somewhat, 10.5 --> 15.1. DDimer elevated, CTA neg PE, also r/o LE DVT, carotid stenosis. UA WNL. Echo done, pending read. No events on tele 03/08: CT abd/pelv d/t persistent diarrhea overnight, (+)colitis, started IV cipro/flagyl     Consultants:  none  Procedures/Surgeries: none      ASSESSMENT & PLAN:   Syncope - hx c/w vasovagal, dehydration d/t diarrhea seconday to colitis  orthostatics  Remain on telemetry Differential diagnoses would include neurally mediated syncope, cardiogenic, arrhythmias related,  orthostatic hypotension and less likely hypoglycemia Uncertain re: GI bleed, monitor CBC GI PCR and CDiff ordered not yet collected  trend CBC, have reasonably ruled out pulmonary infection, UTI    Colitis likely cause for dehydration/syncope IV cipro/flagyl  Sinus arrhythmia telemetry optimize electrolytes.   Essential hypertension Hold home antihypertensives given hx frequent dizziness, suspect orthostatic hypotension or hypotension d/t med effect    GERD without esophagitis PPI    Dyslipidemia Statin      DVT prophylaxis: SCD Nutrition: regular Central lines / invasive devices: none  Code Status: FULL CODE ACP documentation reviewed:  none on file in [facility]  TOC needs: TBD, PT/OT to see Barriers to dispo / significant pending items: monitoring telemtry overnight and trending labs, concern possible GI bleed but cannot confirm at this time

## 2023-07-25 NOTE — ED Notes (Signed)
 Pt had two episodes of bloody diarrhea. Hospitalist informed VSS no complaints at this time.

## 2023-07-25 NOTE — ED Notes (Signed)
 Pt given K+ drink. Drink currently sitting at bedside.

## 2023-07-25 NOTE — ED Notes (Signed)
 Pt brief soiled w/ stool. Pt cleaned and new brief placed.

## 2023-07-25 NOTE — Assessment & Plan Note (Addendum)
-   This is significantly irregular to be concerning for atrial fibrillation. - I will obtain a repeat EKG - We will optimize electrolytes.

## 2023-07-25 NOTE — Assessment & Plan Note (Signed)
 -  We will continue statin therapy.

## 2023-07-25 NOTE — Progress Notes (Signed)
  Brief Progress Note (See full H&P from earlier today)    Hospital course / significant events:   HPI: She deals with constipation once in a while and took a laxative today because she felt constipated. She then had the sensation of abdominal cramping and a sensation of nausea, needed to make a bowel movement so walked over to the toilet and was straining on the toilet and that she passed out. Her husband found her and she awoke shortly thereafter. No seizure-like activity reported. The patient has had significant loose stool since then. At that time reports no GI bleeding. She denies any palpitations shortness of breath chest pain just prior to the syncopal episode. She has had syncopal episodes in the past while straining on the toilet.  03/07: admitted to hospitalist this morning. RN relayed concern for dark stool reported by techs - CBC stable. Pt has not had BM since. GI PCR and CDiff ordered. Troponin have been flat. WBC up somewhat, 10.5 --> 15.1. DDimer elevated, CTA neg PE, also r/o LE DVT, carotid stenosis. UA WNL. Echo done, pending read. No events on tele     Consultants:  none  Procedures/Surgeries: none      ASSESSMENT & PLAN:   Syncope - hx c/w vasovagal, dehydration d/t diarrhea  orthostatics q 12 hours. Remain on telemetry IV hydration, can d/c once taking po  Differential diagnoses would include neurally mediated syncope, cardiogenic, arrhythmias related,  orthostatic hypotension and less likely hypoglycemia. Uncertain re: GI bleed, monitor CBC GI PCR and CDiff ordered if another loose stool occurs Uncertain significance leukocytosis, trend CBC, have reasonably ruled out pulmonary infection, UTI    Sinus arrhythmia telemetry optimize electrolytes.   Essential hypertension Hold home antihypertensives given hx frequent dizziness, suspect orthostatic hypotension or hypotension d/t med effect    GERD without esophagitis PPI    Dyslipidemia Statin      DVT prophylaxis: SCD IV fluids: can dc continuous IV fluids  Nutrition: regular Central lines / invasive devices: none  Code Status: FULL CODE ACP documentation reviewed:  none on file in [facility]  TOC needs: TBD, PT/OT to see Barriers to dispo / significant pending items: monitoring telemtry overnight and trending labs, concern possible GI bleed but cannot confirm at this time      Subjective: Pt confirms above ihstory Has has no more BM since this morning - she did not look at it at that time so cannot confirm black/tarry or bloody Reports some dizziness at home on occasion is pretty normal for her    Objective: Physical Exam:  BP (!) 145/75   Pulse 76   Temp 99.6 F (37.6 C) (Oral)   Resp 15   SpO2 96%  Constitutional:  General Appearance: alert, well-developed, well-nourished, NAD Respiratory: Normal respiratory effort Breath sounds normal, no wheeze/rhonchi/rales Cardiovascular: S1/S2 normal, no murmur/rub/gallop auscultated No lower extremity edema Gastrointestinal: Nontender, no masses Musculoskeletal:  No clubbing/cyanosis of digits Neurological: No cranial nerve deficit on limited exam Psychiatric: Normal judgment/insight Normal mood and affect   Time spent 35 min

## 2023-07-26 ENCOUNTER — Observation Stay

## 2023-07-26 DIAGNOSIS — I10 Essential (primary) hypertension: Secondary | ICD-10-CM | POA: Diagnosis present

## 2023-07-26 DIAGNOSIS — Z800 Family history of malignant neoplasm of digestive organs: Secondary | ICD-10-CM | POA: Diagnosis not present

## 2023-07-26 DIAGNOSIS — K7460 Unspecified cirrhosis of liver: Secondary | ICD-10-CM | POA: Diagnosis not present

## 2023-07-26 DIAGNOSIS — Z91018 Allergy to other foods: Secondary | ICD-10-CM | POA: Diagnosis not present

## 2023-07-26 DIAGNOSIS — E860 Dehydration: Secondary | ICD-10-CM | POA: Diagnosis present

## 2023-07-26 DIAGNOSIS — Z8249 Family history of ischemic heart disease and other diseases of the circulatory system: Secondary | ICD-10-CM | POA: Diagnosis not present

## 2023-07-26 DIAGNOSIS — R188 Other ascites: Secondary | ICD-10-CM | POA: Diagnosis not present

## 2023-07-26 DIAGNOSIS — R748 Abnormal levels of other serum enzymes: Secondary | ICD-10-CM | POA: Diagnosis present

## 2023-07-26 DIAGNOSIS — R55 Syncope and collapse: Secondary | ICD-10-CM | POA: Diagnosis present

## 2023-07-26 DIAGNOSIS — K529 Noninfective gastroenteritis and colitis, unspecified: Secondary | ICD-10-CM | POA: Diagnosis not present

## 2023-07-26 DIAGNOSIS — Z85048 Personal history of other malignant neoplasm of rectum, rectosigmoid junction, and anus: Secondary | ICD-10-CM | POA: Diagnosis not present

## 2023-07-26 DIAGNOSIS — I498 Other specified cardiac arrhythmias: Secondary | ICD-10-CM | POA: Diagnosis present

## 2023-07-26 DIAGNOSIS — Z86011 Personal history of benign neoplasm of the brain: Secondary | ICD-10-CM | POA: Diagnosis not present

## 2023-07-26 DIAGNOSIS — K5900 Constipation, unspecified: Secondary | ICD-10-CM | POA: Diagnosis present

## 2023-07-26 DIAGNOSIS — E785 Hyperlipidemia, unspecified: Secondary | ICD-10-CM | POA: Diagnosis present

## 2023-07-26 DIAGNOSIS — K219 Gastro-esophageal reflux disease without esophagitis: Secondary | ICD-10-CM | POA: Diagnosis present

## 2023-07-26 DIAGNOSIS — R7989 Other specified abnormal findings of blood chemistry: Secondary | ICD-10-CM | POA: Diagnosis present

## 2023-07-26 DIAGNOSIS — Z87891 Personal history of nicotine dependence: Secondary | ICD-10-CM | POA: Diagnosis not present

## 2023-07-26 LAB — COMPREHENSIVE METABOLIC PANEL
ALT: 22 U/L (ref 0–44)
AST: 27 U/L (ref 15–41)
Albumin: 2.9 g/dL — ABNORMAL LOW (ref 3.5–5.0)
Alkaline Phosphatase: 191 U/L — ABNORMAL HIGH (ref 38–126)
Anion gap: 8 (ref 5–15)
BUN: 19 mg/dL (ref 8–23)
CO2: 23 mmol/L (ref 22–32)
Calcium: 8.6 mg/dL — ABNORMAL LOW (ref 8.9–10.3)
Chloride: 108 mmol/L (ref 98–111)
Creatinine, Ser: 0.88 mg/dL (ref 0.44–1.00)
GFR, Estimated: >60 mL/min (ref >60)
Glucose, Bld: 110 mg/dL — ABNORMAL HIGH (ref 70–99)
Potassium: 3.4 mmol/L — ABNORMAL LOW (ref 3.5–5.1)
Sodium: 139 mmol/L (ref 135–145)
Total Bilirubin: 1.1 mg/dL (ref 0.0–1.2)
Total Protein: 6.6 g/dL (ref 6.5–8.1)

## 2023-07-26 LAB — CBC WITH DIFFERENTIAL/PLATELET
Abs Immature Granulocytes: 0.1 10*3/uL — ABNORMAL HIGH (ref 0.00–0.07)
Basophils Absolute: 0 10*3/uL (ref 0.0–0.1)
Basophils Relative: 0 %
Eosinophils Absolute: 0 10*3/uL (ref 0.0–0.5)
Eosinophils Relative: 0 %
HCT: 35.5 % — ABNORMAL LOW (ref 36.0–46.0)
Hemoglobin: 11.8 g/dL — ABNORMAL LOW (ref 12.0–15.0)
Immature Granulocytes: 1 %
Lymphocytes Relative: 10 %
Lymphs Abs: 1.6 10*3/uL (ref 0.7–4.0)
MCH: 31.1 pg (ref 26.0–34.0)
MCHC: 33.2 g/dL (ref 30.0–36.0)
MCV: 93.4 fL (ref 80.0–100.0)
Monocytes Absolute: 0.9 10*3/uL (ref 0.1–1.0)
Monocytes Relative: 6 %
Neutro Abs: 13.3 10*3/uL — ABNORMAL HIGH (ref 1.7–7.7)
Neutrophils Relative %: 83 %
Platelets: 147 10*3/uL — ABNORMAL LOW (ref 150–400)
RBC: 3.8 MIL/uL — ABNORMAL LOW (ref 3.87–5.11)
RDW: 13.1 % (ref 11.5–15.5)
WBC: 16 10*3/uL — ABNORMAL HIGH (ref 4.0–10.5)
nRBC: 0 % (ref 0.0–0.2)

## 2023-07-26 LAB — GASTROINTESTINAL PANEL BY PCR, STOOL (REPLACES STOOL CULTURE)

## 2023-07-26 LAB — CBG MONITORING, ED: Glucose-Capillary: 107 mg/dL — ABNORMAL HIGH (ref 70–99)

## 2023-07-26 LAB — C DIFFICILE QUICK SCREEN W PCR REFLEX
C Diff antigen: NEGATIVE
C Diff interpretation: NOT DETECTED
C Diff toxin: NEGATIVE

## 2023-07-26 MED ORDER — IOHEXOL 300 MG/ML  SOLN
100.0000 mL | Freq: Once | INTRAMUSCULAR | Status: AC | PRN
  Administered 2023-07-26: 100 mL via INTRAVENOUS

## 2023-07-26 MED ORDER — CIPROFLOXACIN IN D5W 200 MG/100ML IV SOLN
200.0000 mg | Freq: Two times a day (BID) | INTRAVENOUS | Status: DC
  Administered 2023-07-26 – 2023-07-27 (×3): 200 mg via INTRAVENOUS
  Filled 2023-07-26 (×4): qty 100

## 2023-07-26 MED ORDER — SODIUM CHLORIDE 0.9 % IV SOLN: INTRAVENOUS | Status: AC

## 2023-07-26 MED ORDER — METRONIDAZOLE 500 MG/100ML IV SOLN
500.0000 mg | Freq: Two times a day (BID) | INTRAVENOUS | Status: DC
  Administered 2023-07-26 – 2023-07-27 (×3): 500 mg via INTRAVENOUS
  Filled 2023-07-26 (×3): qty 100

## 2023-07-26 NOTE — Progress Notes (Signed)
 Patient admitted from ED to room 220 accompanied by husband. A+Ox4. Vital signs stable. No complaints of pain. Patient x1 assist with walker to the bathroom. Orthostatic blood pressures checked. Stool sample collected. Will continue to monitor and assess with plan of care.

## 2023-07-26 NOTE — Plan of Care (Signed)

## 2023-07-26 NOTE — Progress Notes (Signed)
 PROGRESS NOTE    Vanessa Reynolds   ZOX:[id number] DOB: [birth year]  DOA: 07/25/2023 Date of Service: 07/26/23 which is hospital day 0  PCP: Smitty Cords, [facility] course / significant events:   HPI: She deals with constipation once in a while and took a laxative today because she felt constipated. She then had the sensation of abdominal cramping and a sensation of nausea, needed to make a bowel movement so walked over to the toilet and was straining on the toilet and that she passed out. Her husband found her and she awoke shortly thereafter. No seizure-like activity reported. The patient has had significant loose stool since then. At that time reports no GI bleeding. She denies any palpitations shortness of breath chest pain just prior to the syncopal episode. She has had syncopal episodes in the past while straining on the toilet.  03/07: admitted to hospitalist this morning. RN relayed concern for dark stool reported by techs - CBC stable. Pt has not had BM since. GI PCR and CDiff ordered. Troponin have been flat. WBC up somewhat, 10.5 --> 15.1. DDimer elevated, CTA neg PE, also r/o LE DVT, carotid stenosis. UA WNL. Echo done, pending read. No events on tele 03/08: CT abd/pelv d/t persistent diarrhea overnight, (+)colitis, started IV cipro/flagyl     Consultants:  none  Procedures/Surgeries: none      ASSESSMENT & PLAN:   Syncope - hx c/w vasovagal, dehydration d/t diarrhea seconday to colitis  orthostatics  Remain on telemetry Differential diagnoses would include neurally mediated syncope, cardiogenic, arrhythmias related,  orthostatic hypotension and less likely hypoglycemia Uncertain re: GI bleed, monitor CBC GI PCR and CDiff ordered not yet collected  trend CBC, have reasonably ruled out pulmonary infection, UTI    Colitis likely cause for dehydration/syncope IV cipro/flagyl  Sinus arrhythmia telemetry optimize electrolytes.   Essential  hypertension Hold home antihypertensives given hx frequent dizziness, suspect orthostatic hypotension or hypotension d/t med effect    GERD without esophagitis PPI   Dyslipidemia Statin      DVT prophylaxis: SCD Nutrition: regular Central lines / invasive devices: none  Code Status: FULL CODE ACP documentation reviewed:  none on file in [facility]  TOC needs: TBD, PT/OT to see Barriers to dispo / significant pending items: monitoring telemtry overnight and trending labs, concern possible GI bleed but cannot confirm at this time             Subjective / Brief ROS:  Patient reports lower abd discomfort, bright red blood in stool minimal, no nausea/vomiting  Denies CP/SOB.  Pain controlled.  Denies new weakness.  Tolerating diet.  Reports no concerns w/ urination/defecation.   Family Communication: none at thist ime      Objective Findings:  Vitals:   07/26/23 1122 07/26/23 1200 07/26/23 1300 07/26/23 1400  BP: (!) 145/73 (!) 149/66 (!) 127/59 (!) 145/68  Pulse: 73 77 71 72  Resp: 15 15 16 19   Temp: 98.2 F (36.8 C)     TempSrc: Oral     SpO2: 97% 97% 91% 96%    Intake/Output Summary (Last 24 hours) at 07/26/2023 1450 Last data filed at 07/26/2023 1400 Gross per 24 hour  Intake 1192.99 ml  Output --  Net 1192.99 ml   There were no vitals filed for this visit.  Examination:  Physical Exam Cardiovascular:     Rate and Rhythm: Normal rate and regular rhythm.  Pulmonary:     Effort: Pulmonary effort is normal.  Breath sounds: Normal breath sounds.  Abdominal:     General: There is no distension.     Tenderness: There is abdominal tenderness (LLQ mild). There is no guarding or rebound.  Musculoskeletal:     Right lower leg: No edema.     Left lower leg: No edema.  Skin:    General: Skin is warm and dry.  Neurological:     General: No focal deficit present.     Mental Status: She is alert and oriented to person, place, and time.  Psychiatric:         Mood and Affect: Mood normal.        Behavior: Behavior normal.          Scheduled Medications:   pantoprazole  40 mg Oral Daily   pravastatin  20 mg Oral q1800   sodium chloride flush  3 mL Intravenous Q12H   vitamin B-12  100 mcg Oral Daily    Continuous Infusions:  sodium chloride 100 mL/hr at 07/26/23 1348   ciprofloxacin Stopped (07/26/23 1226)   metronidazole Stopped (07/26/23 1117)    PRN Medications:  acetaminophen **OR** acetaminophen, fluticasone, ondansetron **OR** ondansetron (ZOFRAN) IV, traZODone  Antimicrobials from admission:  Anti-infectives (From admission, onward)    Start     Dose/Rate Route Frequency Ordered Stop   07/26/23 1000  ciprofloxacin (CIPRO) IVPB 200 mg        200 mg 100 mL/hr over 60 Minutes Intravenous Every 12 hours 07/26/23 0944     07/26/23 1000  metroNIDAZOLE (FLAGYL) IVPB 500 mg        500 mg 100 mL/hr over 60 Minutes Intravenous Every 12 hours 07/26/23 0944             Data Reviewed:  I have personally reviewed the following...  CBC: Recent Labs  Lab 07/25/23 0058 07/25/23 0529 07/25/23 1144 07/26/23 0434  WBC 10.5 13.6* 15.1* 16.0*  NEUTROABS 8.4*  --   --  13.3*  HGB 12.8 12.4 12.6 11.8*  HCT 38.6 37.4 38.2 35.5*  MCV 93.9 93.3 92.7 93.4  PLT 158 144* 147* 147*   Basic Metabolic Panel: Recent Labs  Lab 07/25/23 0058 07/25/23 0529 07/26/23 0434  NA 140 139 139  K 3.7 3.5 3.4*  CL 107 108 108  CO2 24 23 23   GLUCOSE 132* 139* 110*  BUN 20 20 19   CREATININE 0.98 0.84 0.88  CALCIUM 9.7 8.7* 8.6*  MG 2.1  --   --    GFR: CrCl cannot be calculated (Unknown ideal weight.). Liver Function Tests: Recent Labs  Lab 07/25/23 0058 07/26/23 0434  AST 40 27  ALT 31 22  ALKPHOS 275* 191*  BILITOT 0.7 1.1  PROT 7.4 6.6  ALBUMIN 3.4* 2.9*   No results for input(s): "LIPASE", "AMYLASE" in the last 168 hours. No results for input(s): "AMMONIA" in the last 168 hours. Coagulation Profile: No results  for input(s): "INR", "PROTIME" in the last 168 hours. Cardiac Enzymes: No results for input(s): "CKTOTAL", "CKMB", "CKMBINDEX", "TROPONINI" in the last 168 hours. BNP (last 3 results) No results for input(s): "PROBNP" in the last 8760 hours. HbA1C: No results for input(s): "HGBA1C" in the last 72 hours. CBG: Recent Labs  Lab 07/26/23 0451  GLUCAP 107*   Lipid Profile: No results for input(s): "CHOL", "HDL", "LDLCALC", "TRIG", "CHOLHDL", "LDLDIRECT" in the last 72 hours. Thyroid Function Tests: No results for input(s): "TSH", "T4TOTAL", "FREET4", "T3FREE", "THYROIDAB" in the last 72 hours. Anemia Panel: No results for input(s): "  VITAMINB12", "FOLATE", "FERRITIN", "TIBC", "IRON", "RETICCTPCT" in the last 72 hours. Most Recent Urinalysis On File:     Component Value Date/Time   COLORURINE YELLOW (A) 07/25/2023 0210   APPEARANCEUR CLEAR (A) 07/25/2023 0210   APPEARANCEUR Cloudy (A) 11/08/2019 0858   LABSPEC 1.006 07/25/2023 0210   PHURINE 5.0 07/25/2023 0210   GLUCOSEU NEGATIVE 07/25/2023 0210   HGBUR NEGATIVE 07/25/2023 0210   BILIRUBINUR NEGATIVE 07/25/2023 0210   BILIRUBINUR Negative 08/02/2022 1506   BILIRUBINUR Negative 11/08/2019 0858   KETONESUR NEGATIVE 07/25/2023 0210   PROTEINUR NEGATIVE 07/25/2023 0210   UROBILINOGEN 0.2 08/02/2022 1506   NITRITE NEGATIVE 07/25/2023 0210   LEUKOCYTESUR NEGATIVE 07/25/2023 0210   Sepsis Labs: @LABRCNTIP (procalcitonin:4,lacticidven:4) Microbiology: No results found for this or any previous visit (from the past 240 hours).    Radiology Studies last 3 days: CT ABDOMEN PELVIS W CONTRAST Result Date: 07/26/2023 CLINICAL DATA:  Abdominal pain and cramping. Nausea and diarrhea. Leukocytosis. EXAM: CT ABDOMEN AND PELVIS WITH CONTRAST TECHNIQUE: Multidetector CT imaging of the abdomen and pelvis was performed using the standard protocol following bolus administration of intravenous contrast. RADIATION DOSE REDUCTION: This exam was performed  according to the departmental dose-optimization program which includes automated exposure control, adjustment of the mA and/or kV according to patient size and/or use of iterative reconstruction technique. CONTRAST:  OMNIPAQUE IOHEXOL 300 MG/ML  SOLN COMPARISON:  10/11/2019 FINDINGS: Lower Chest: Tiny bilateral pleural effusions and dependent bibasilar atelectasis. Hepatobiliary: Increased caudate hypertrophy and mild capsular nodularity, consistent with cirrhosis. Small hepatic cysts remains stable. No suspicious hepatic masses identified. Layering gallbladder sludge or tiny gallstones noted. No signs of cholecystitis or biliary ductal dilatation. Pancreas:  No mass or inflammatory changes. Spleen: Within normal limits in size and appearance. Adrenals/Urinary Tract: No suspicious masses identified. No evidence of ureteral calculi or hydronephrosis. Stomach/Bowel: Moderate wall thickening is seen throughout the length of the descending and sigmoid colon, consistent with colitis. No evidence of perforation or abscess. Mild ascites noted. Rectal surgical anastomosis is again seen. A fluid collection adjacent to the right lateral wall of the rectum is again seen measuring 4.4 x 3.7 cm, without significant change since previous study, and likely postop in etiology. Vascular/Lymphatic: No pathologically enlarged lymph nodes. No acute vascular findings. Reproductive:  No mass or other significant abnormality. Other:  None. Musculoskeletal:  No suspicious bone lesions identified. IMPRESSION: Moderate colitis involving the descending and sigmoid colon. No evidence of perforation or abscess. Mild ascites. Hepatic cirrhosis. No evidence of hepatic neoplasm. Layering gallbladder sludge or tiny gallstones. No radiographic evidence of cholecystitis. Tiny bilateral pleural effusions and dependent bibasilar atelectasis. Electronically Signed   By: Danae Orleans M.D.   On: 07/26/2023 09:34   ECHOCARDIOGRAM COMPLETE Result  Date: 07/25/2023    ECHOCARDIOGRAM REPORT   Patient Name:   Vanessa Reynolds Date of Exam: 07/25/2023 Medical Rec #:  [id number]      Height:       63.7 in Accession #:    [id number]     Weight:       167.6 lb Date of Birth:  [birth year]      BSA:          1.809 m Patient Age:    77 years       BP:           154/78 mmHg Patient Gender: F              HR:  76 bpm. Exam Location:  [facility] Procedure: 2D Echo, Cardiac Doppler and Color Doppler (Both Spectral and Color            Flow Doppler were utilized during procedure). Indications:     Syncope  History:         Patient has prior history of Echocardiogram examinations, most                  recent 12/09/2019. Endocarditis, Signs/Symptoms:Syncope and                  Murmur; Risk Factors:Hypertension, Dyslipidemia and Former                  Smoker. Rheumatic fever.  Sonographer:     Mikki Harbor Referring Phys:  [id number] JAN A MANSY Diagnosing Phys: Debbe Odea MD  Sonographer Comments: Image acquisition challenging due to respiratory motion. IMPRESSIONS  1. Left ventricular ejection fraction, by estimation, is 60 to 65%. The left ventricle has normal function. The left ventricle has no regional wall motion abnormalities. There is mild left ventricular hypertrophy. Left ventricular diastolic parameters were normal.  2. Right ventricular systolic function is normal. The right ventricular size is normal. There is normal pulmonary artery systolic pressure.  3. The mitral valve is normal in structure. No evidence of mitral valve regurgitation.  4. The aortic valve is tricuspid. Aortic valve regurgitation is not visualized. Aortic valve sclerosis is present, with no evidence of aortic valve stenosis.  5. The inferior vena cava is normal in size with greater than 50% respiratory variability, suggesting right atrial pressure of 3 mmHg. FINDINGS  Left Ventricle: Left ventricular ejection fraction, by estimation, is 60 to 65%. The left ventricle has normal function.  The left ventricle has no regional wall motion abnormalities. Strain was performed and the global longitudinal strain is indeterminate. The left ventricular internal cavity size was normal in size. There is mild left ventricular hypertrophy. Left ventricular diastolic parameters were normal. Right Ventricle: The right ventricular size is normal. No increase in right ventricular wall thickness. Right ventricular systolic function is normal. There is normal pulmonary artery systolic pressure. The tricuspid regurgitant velocity is 2.76 m/s, and  with an assumed right atrial pressure of 3 mmHg, the estimated right ventricular systolic pressure is 33.5 mmHg. Left Atrium: Left atrial size was normal in size. Right Atrium: Right atrial size was normal in size. Pericardium: There is no evidence of pericardial effusion. Mitral Valve: The mitral valve is normal in structure. No evidence of mitral valve regurgitation. MV peak gradient, 3.1 mmHg. The mean mitral valve gradient is 1.0 mmHg. Tricuspid Valve: The tricuspid valve is normal in structure. Tricuspid valve regurgitation is mild. Aortic Valve: The aortic valve is tricuspid. Aortic valve regurgitation is not visualized. Aortic valve sclerosis is present, with no evidence of aortic valve stenosis. Aortic valve mean gradient measures 3.0 mmHg. Aortic valve peak gradient measures 6.8  mmHg. Aortic valve area, by VTI measures 2.57 cm. Pulmonic Valve: The pulmonic valve was normal in structure. Pulmonic valve regurgitation is mild. Aorta: The aortic root is normal in size and structure. Venous: The inferior vena cava is normal in size with greater than 50% respiratory variability, suggesting right atrial pressure of 3 mmHg. IAS/Shunts: No atrial level shunt detected by color flow Doppler. Additional Comments: 3D was performed not requiring image post processing on an independent workstation and was indeterminate.  LEFT VENTRICLE PLAX 2D LVIDd:         4.50 cm  Diastology  LVIDs:         2.50 cm   LV e' medial:    10.80 cm/s LV PW:         1.00 cm   LV E/e' medial:  6.3 LV IVS:        1.20 cm   LV e' lateral:   10.20 cm/s LVOT diam:     1.90 cm   LV E/e' lateral: 6.7 LV SV:         67 LV SV Index:   37 LVOT Area:     2.84 cm  RIGHT VENTRICLE RV Basal diam:  3.70 cm RV Mid diam:    3.10 cm RV S prime:     15.70 cm/s LEFT ATRIUM             Index        RIGHT ATRIUM           Index LA diam:        4.10 cm 2.27 cm/m   RA Area:     21.30 cm LA Vol (A2C):   57.4 ml 31.73 ml/m  RA Volume:   65.90 ml  36.43 ml/m LA Vol (A4C):   52.7 ml 29.13 ml/m LA Biplane Vol: 57.2 ml 31.62 ml/m  AORTIC VALVE                    PULMONIC VALVE AV Area (Vmax):    2.31 cm     PV Vmax:       0.98 m/s AV Area (Vmean):   2.34 cm     PV Peak grad:  3.8 mmHg AV Area (VTI):     2.57 cm AV Vmax:           130.00 cm/s AV Vmean:          78.800 cm/s AV VTI:            0.260 m AV Peak Grad:      6.8 mmHg AV Mean Grad:      3.0 mmHg LVOT Vmax:         106.00 cm/s LVOT Vmean:        65.100 cm/s LVOT VTI:          0.236 m LVOT/AV VTI ratio: 0.91  AORTA Ao Root diam: 3.60 cm MITRAL VALVE               TRICUSPID VALVE MV Area (PHT): 3.19 cm    TR Peak grad:   30.5 mmHg MV Area VTI:   3.25 cm    TR Vmax:        276.00 cm/s MV Peak grad:  3.1 mmHg MV Mean grad:  1.0 mmHg    SHUNTS MV Vmax:       0.88 m/s    Systemic VTI:  0.24 m MV Vmean:      49.8 cm/s   Systemic Diam: 1.90 cm MV Decel Time: 238 msec MV E velocity: 68.40 cm/s MV A velocity: 79.40 cm/s MV E/A ratio:  0.86 Debbe Odea MD Electronically signed by Debbe Odea MD Signature Date/Time: 07/25/2023/4:00:39 PM    Final    US Carotid Bilateral Result Date: 07/25/2023 CLINICAL DATA:  Syncope EXAM: BILATERAL CAROTID DUPLEX ULTRASOUND TECHNIQUE: Wallace Cullens scale imaging, color Doppler and duplex ultrasound were performed of bilateral carotid and vertebral arteries in the neck. COMPARISON:  None Available. FINDINGS: Criteria: Quantification of carotid  stenosis is based on velocity parameters that correlate the residual internal  carotid diameter with NASCET-based stenosis levels, using the diameter of the distal internal carotid lumen as the denominator for stenosis measurement. The following velocity measurements were obtained: RIGHT ICA: 91 cm/sec CCA: 51 cm/sec SYSTOLIC ICA/CCA RATIO:  1.8 ECA: 81 cm/sec LEFT ICA: 93 cm/sec CCA: 62 cm/sec SYSTOLIC ICA/CCA RATIO:  1.5 ECA: 88 cm/sec RIGHT CAROTID ARTERY: Antegrade flow with slight intimal thickening and plaque RIGHT VERTEBRAL ARTERY:  Antegrade flow LEFT CAROTID ARTERY: Antegrade flow with slight intimal thickening and plaque LEFT VERTEBRAL ARTERY:  Antegrade IMPRESSION: No significant stenosis of either internal carotid artery. Mild in wall thickening and plaque. Electronically Signed   By: Karen Kays M.D.   On: 07/25/2023 11:35   CT Angio Chest Pulmonary Embolism (PE) W or WO Contrast Result Date: 07/25/2023 CLINICAL DATA:  Syncope. EXAM: CT ANGIOGRAPHY CHEST WITH CONTRAST TECHNIQUE: Multidetector CT imaging of the chest was performed using the standard protocol during bolus administration of intravenous contrast. Multiplanar CT image reconstructions and MIPs were obtained to evaluate the vascular anatomy. RADIATION DOSE REDUCTION: This exam was performed according to the departmental dose-optimization program which includes automated exposure control, adjustment of the mA and/or kV according to patient size and/or use of iterative reconstruction technique. CONTRAST:  75mL OMNIPAQUE IOHEXOL 350 MG/ML SOLN COMPARISON:  None Available. FINDINGS: Cardiovascular: Satisfactory opacification of the pulmonary arteries to the segmental level. No evidence of pulmonary embolism. Normal heart size. No pericardial effusion. Mediastinum/Nodes: No enlarged mediastinal, hilar, or axillary lymph nodes. Thyroid gland, trachea, and esophagus demonstrate no significant findings. Lungs/Pleura: No pneumothorax or pleural  effusion is noted. Emphysematous disease is noted. Minimal bilateral posterior basilar subsegmental atelectasis is noted. 17 x 13 mm ill-defined subpleural density is noted in left upper lobe most consistent with scarring or possibly focal inflammation. Upper Abdomen: No acute abnormality. Musculoskeletal: No chest wall abnormality. No acute or significant osseous findings. Review of the MIP images confirms the above findings. IMPRESSION: No definite evidence of pulmonary embolus. 17 x 13 mm ill-defined subpleural density noted anteriorly in left upper lobe most consistent with focal scarring or possibly focal inflammation, although other pathology cannot be excluded. Initial follow-up by chest CT without contrast is recommended in 3 months to confirm persistence. This recommendation follows the consensus statement: Recommendations for the Management of Subsolid Pulmonary Nodules Detected at CT: A Statement from the Fleischner Society as published in Radiology 2013; 266:304-317. Aortic Atherosclerosis (ICD10-I70.0) and Emphysema (ICD10-J43.9). Electronically Signed   By: Lupita Raider M.D.   On: 07/25/2023 10:19   US Venous Img Lower Bilateral (DVT) Result Date: 07/25/2023 CLINICAL DATA:  Lower extremity pain and elevated D-dimer. EXAM: BILATERAL LOWER EXTREMITY VENOUS DOPPLER ULTRASOUND TECHNIQUE: Gray-scale sonography with graded compression, as well as color Doppler and duplex ultrasound were performed to evaluate the lower extremity deep venous systems from the level of the common femoral vein and including the common femoral, femoral, profunda femoral, popliteal and calf veins including the posterior tibial, peroneal and gastrocnemius veins when visible. The superficial great saphenous vein was also interrogated. Spectral Doppler was utilized to evaluate flow at rest and with distal augmentation maneuvers in the common femoral, femoral and popliteal veins. COMPARISON:  None Available. FINDINGS: RIGHT LOWER  EXTREMITY Common Femoral Vein: No evidence of thrombus. Normal compressibility, respiratory phasicity and response to augmentation. Saphenofemoral Junction: No evidence of thrombus. Normal compressibility and flow on color Doppler imaging. Profunda Femoral Vein: No evidence of thrombus. Normal compressibility and flow on color Doppler imaging. Femoral Vein: No evidence of thrombus.  Normal compressibility, respiratory phasicity and response to augmentation. Popliteal Vein: No evidence of thrombus. Normal compressibility, respiratory phasicity and response to augmentation. Calf Veins: No evidence of thrombus. Normal compressibility and flow on color Doppler imaging. Superficial Great Saphenous Vein: No evidence of thrombus. Normal compressibility. Venous Reflux:  None. Other Findings: No evidence of superficial thrombophlebitis or abnormal fluid collection. LEFT LOWER EXTREMITY Common Femoral Vein: No evidence of thrombus. Normal compressibility, respiratory phasicity and response to augmentation. Saphenofemoral Junction: No evidence of thrombus. Normal compressibility and flow on color Doppler imaging. Profunda Femoral Vein: No evidence of thrombus. Normal compressibility and flow on color Doppler imaging. Femoral Vein: No evidence of thrombus. Normal compressibility, respiratory phasicity and response to augmentation. Popliteal Vein: No evidence of thrombus. Normal compressibility, respiratory phasicity and response to augmentation. Calf Veins: No evidence of thrombus. Normal compressibility and flow on color Doppler imaging. Superficial Great Saphenous Vein: No evidence of thrombus. Normal compressibility. Venous Reflux:  None. Other Findings: No evidence of superficial thrombophlebitis or abnormal fluid collection. IMPRESSION: No evidence of deep venous thrombosis in either lower extremity. Electronically Signed   By: Irish Lack M.D.   On: 07/25/2023 09:38   CT Head Wo Contrast Result Date:  07/25/2023 CLINICAL DATA:  Head trauma, minor (Age >= 65y) EXAM: CT HEAD WITHOUT CONTRAST TECHNIQUE: Contiguous axial images were obtained from the base of the skull through the vertex without intravenous contrast. RADIATION DOSE REDUCTION: This exam was performed according to the departmental dose-optimization program which includes automated exposure control, adjustment of the mA and/or kV according to patient size and/or use of iterative reconstruction technique. COMPARISON:  MRI March 20, 2021. FINDINGS: Brain: No evidence of acute infarction, hemorrhage, hydrocephalus, extra-axial collection or midline shift. Approximately 1 cm calcified extra-axial dural-based lesion along the anterior right falx, compatible with a meningioma. 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 Minimal mass effect on the adjacent brain. Without patchy white matter hypodensities, compatible with chronic microvascular ischemic change. Vascular: Calcific atherosclerosis.  No hyperdense vessel. Skull: No acute fracture. Sinuses/Orbits: Clear sinuses. IMPRESSION: 1. No evidence of acute intracranial abnormality. 2. Similar right anterior parafalcine meningioma. 3. 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 Electronically Signed   By: Feliberto Harts M.D.   On: 07/25/2023 02:59          Sunnie Nielsen, DO [facility] 07/26/2023, 2:50 PM    Dictation software may have been used to generate the above note. Typos may occur and escape review in typed/dictated notes. Please contact Dr Lyn Hollingshead directly for clarity if needed.  Staff may message me via secure chat in Epic  but this may not receive an immediate response,  please page me for urgent matters!  If 7PM-7AM, please contact night coverage [email or web address]

## 2023-07-26 NOTE — ED Notes (Signed)
 Patient had a bowel movement. Patient has been clean up. Patient has on a clean gown, a clean chux, a clean brief. Nurse was notified that there was blood in her stool.

## 2023-07-26 NOTE — ED Notes (Signed)
Pt brief changed at this time

## 2023-07-26 NOTE — ED Notes (Signed)
 Cleaned pt up. Changed brief and gown.

## 2023-07-26 NOTE — Care Management Obs Status (Signed)
 MEDICARE OBSERVATION STATUS NOTIFICATION   Patient Details  Name: [name] MRN: [id number] Date of Birth: 06/08/1945   Medicare Observation Status Notification Given:  Orland Dec, CMA 07/26/2023, 9:36 AM

## 2023-07-26 NOTE — ED Notes (Signed)
 Provider aware of rectal bleeding. No abdominal cramping. Pt reports hx hemorrhoids. Orders received to continue to monitor.

## 2023-07-27 DIAGNOSIS — R55 Syncope and collapse: Secondary | ICD-10-CM | POA: Diagnosis not present

## 2023-07-27 LAB — BASIC METABOLIC PANEL
Anion gap: 4 — ABNORMAL LOW (ref 5–15)
BUN: 14 mg/dL (ref 8–23)
CO2: 22 mmol/L (ref 22–32)
Calcium: 8.3 mg/dL — ABNORMAL LOW (ref 8.9–10.3)
Chloride: 108 mmol/L (ref 98–111)
Creatinine, Ser: 0.71 mg/dL (ref 0.44–1.00)
GFR, Estimated: >60 mL/min (ref >60)
Glucose, Bld: 94 mg/dL (ref 70–99)
Potassium: 3.3 mmol/L — ABNORMAL LOW (ref 3.5–5.1)
Sodium: 134 mmol/L — ABNORMAL LOW (ref 135–145)

## 2023-07-27 LAB — CBC
HCT: 33.6 % — ABNORMAL LOW (ref 36.0–46.0)
Hemoglobin: 11.6 g/dL — ABNORMAL LOW (ref 12.0–15.0)
MCH: 30.9 pg (ref 26.0–34.0)
MCHC: 34.5 g/dL (ref 30.0–36.0)
MCV: 89.6 fL (ref 80.0–100.0)
Platelets: 153 10*3/uL (ref 150–400)
RBC: 3.75 MIL/uL — ABNORMAL LOW (ref 3.87–5.11)
RDW: 12.8 % (ref 11.5–15.5)
WBC: 12.9 10*3/uL — ABNORMAL HIGH (ref 4.0–10.5)
nRBC: 0 % (ref 0.0–0.2)

## 2023-07-27 LAB — GLUCOSE, CAPILLARY: Glucose-Capillary: 86 mg/dL (ref 70–99)

## 2023-07-27 MED ORDER — POTASSIUM CHLORIDE CRYS ER 20 MEQ PO TBCR
40.0000 meq | EXTENDED_RELEASE_TABLET | Freq: Once | ORAL | Status: AC
  Administered 2023-07-27: 40 meq via ORAL
  Filled 2023-07-27: qty 2

## 2023-07-27 MED ORDER — CIPROFLOXACIN HCL 500 MG PO TABS: 500.0000 mg | ORAL_TABLET | Freq: Two times a day (BID) | ORAL | 0 refills | Status: AC

## 2023-07-27 MED ORDER — METRONIDAZOLE 500 MG PO TABS
500.0000 mg | ORAL_TABLET | Freq: Three times a day (TID) | ORAL | 0 refills | Status: AC
Start: 2023-07-27 — End: 2023-08-02

## 2023-07-27 MED ORDER — ONDANSETRON HCL 4 MG PO TABS: 4.0000 mg | ORAL_TABLET | Freq: Four times a day (QID) | ORAL | 0 refills | Status: DC | PRN

## 2023-07-27 NOTE — Progress Notes (Signed)
 Discharge instructions and med details reviewed with patient and husband. All questions answered. Printed AVS given to patient. IV removed.  Patient escorted out via wheelchair.

## 2023-07-27 NOTE — Discharge Summary (Signed)
 Physician Discharge Summary   Patient: Vanessa Reynolds MRN: [id number]  DOB: 07-27-1945   Admit:     Date of Admission: 07/25/2023 Admitted from: home   Discharge: Date of discharge: 07/27/23 Disposition: Home Condition at discharge: good  CODE STATUS: FULL CODE     Discharge Physician: Sunnie Nielsen, DO [facility]     PCP: Smitty Cords, DO  Recommendations for Outpatient Follow-up:  Follow up with PCP Smitty Cords, DO in 1-2 weeks Please obtain labs/tests: arrange cardiac monitor if not already done   Discharge Instructions     Diet general   Complete by: As directed    Increase activity slowly   Complete by: As directed          Discharge Diagnoses: Principal Problem:   Syncope Active Problems:   Elevated d-dimer   Essential hypertension   Sinus arrhythmia   Dyslipidemia   GERD without esophagitis   Colitis      Hospital course / significant events:   HPI: She deals with constipation once in a while and took a laxative today because she felt constipated. She then had the sensation of abdominal cramping and a sensation of nausea, needed to make a bowel movement so walked over to the toilet and was straining on the toilet and that she passed out. Her husband found her and she awoke shortly thereafter. No seizure-like activity reported. The patient has had significant loose stool since then. At that time reports no GI bleeding. She denies any palpitations shortness of breath chest pain just prior to the syncopal episode. She has had syncopal episodes in the past while straining on the toilet.  03/07: admitted to hospitalist this morning. RN relayed concern for dark stool reported by techs - CBC stable. Pt has not had BM since. GI PCR and CDiff ordered. Troponin have been flat. WBC up somewhat, 10.5 --> 15.1. DDimer elevated, CTA neg PE, also r/o LE DVT, carotid stenosis. UA WNL. Echo done, pending read. No events on  tele 03/08: CT abd/pelv d/t persistent diarrhea overnight, (+)colitis, started IV cipro/flagyl     Consultants:  none  Procedures/Surgeries: none      ASSESSMENT & PLAN:   Syncope - hx c/w vasovagal, dehydration d/t diarrhea seconday to colitis  Colitis likely cause for dehydration/syncope po cipro/flagyl  Sinus arrhythmia telemetry optimize electrolytes. Cardiology follow up    Essential hypertension Hold home antihypertensives given hx frequent dizziness, suspect orthostatic hypotension or hypotension d/t med effect    GERD without esophagitis PPI   Dyslipidemia Statin            Discharge Instructions  Allergies as of 07/27/2023       Reactions   Fish Oil Other (See Comments)   Disruptions to Balance        Medication List     PAUSE taking these medications    lisinopril 40 MG tablet Wait to take this until your doctor or other care provider tells you to start again. Commonly known as: ZESTRIL Take 1 tablet (40 mg total) by mouth at bedtime.       TAKE these medications    ciprofloxacin 500 MG tablet Commonly known as: CIPRO Take 1 tablet (500 mg total) by mouth 2 (two) times daily for 13 doses. Starting evening 07/27/2023   fluticasone 50 MCG/ACT nasal spray Commonly known as: FLONASE PLACE 2 SPRAYS INTO BOTH NOSTRILS DAILY.USE FOR 4-6 WEEKS THEN STOP AND USE SEASONALLY OR AS NEEDED  loratadine 10 MG tablet Commonly known as: CLARITIN Take 1 tablet (10 mg total) by mouth daily. Use for 4-6 weeks then stop, and use as needed or seasonally   lovastatin 20 MG tablet Commonly known as: MEVACOR Take 1 tablet (20 mg total) by mouth at bedtime.   metroNIDAZOLE 500 MG tablet Commonly known as: Flagyl Take 1 tablet (500 mg total) by mouth 3 (three) times daily for 17 doses. Starting afternoon 07/27/23   omeprazole 20 MG capsule Commonly known as: PRILOSEC Take 1 capsule (20 mg total) by mouth daily before breakfast.    ondansetron 4 MG tablet Commonly known as: ZOFRAN Take 1 tablet (4 mg total) by mouth every 6 (six) hours as needed for nausea.   vitamin B-12 100 MCG tablet Commonly known as: CYANOCOBALAMIN Take 100 mcg by mouth daily.         Follow-up Information     Smitty Cords, DO. Schedule an appointment as soon as possible for a visit .   Specialty: Family Medicine Contact information: [address] [place] [address] [phone]         Debbe Odea, MD. Schedule an appointment as soon as possible for a visit.   Specialties: Cardiology, Radiology Why: hospital follow up - heart monitor for syncope Contact information: [address] [place] [address] [phone]                 Allergies  Allergen Reactions   Fish Oil Other (See Comments)    Disruptions to Balance     Subjective: pt reports some loose stool but significant improvement from eysterday, abdominal pain has resolved    Discharge Exam: BP (!) 150/63 (BP Location: Left Arm)   Pulse 67   Temp 99.1 F (37.3 C)   Resp 18   Ht 5' 3.75" (1.619 m)   Wt 78 kg   SpO2 93%   BMI 29.75 kg/m  General: Pt is alert, awake, not in acute distress Cardiovascular: irreg rhythm, rate WNL, S1/S2 Respiratory: CTA bilaterally, no wheezing, no rhonchi Abdominal: Soft, NT, ND, bowel sounds + Extremities: no edema, no cyanosis     The results of significant diagnostics from this hospitalization (including imaging, microbiology, ancillary and laboratory) are listed below for reference.     Microbiology: Recent Results (from the past 240 hours)  Gastrointestinal Panel by PCR , Stool     Status: None   Collection Time: 07/25/23  6:05 PM   Specimen: Stool  Result Value Ref Range Status   Campylobacter species NOT DETECTED NOT DETECTED Final   Plesimonas shigelloides NOT DETECTED NOT DETECTED Final   Salmonella species NOT DETECTED NOT DETECTED Final   Yersinia enterocolitica NOT  DETECTED NOT DETECTED Final   Vibrio species NOT DETECTED NOT DETECTED Final   Vibrio cholerae NOT DETECTED NOT DETECTED Final   Enteroaggregative E coli (EAEC) NOT DETECTED NOT DETECTED Final   Enteropathogenic E coli (EPEC) NOT DETECTED NOT DETECTED Final   Enterotoxigenic E coli (ETEC) NOT DETECTED NOT DETECTED Final   Shiga like toxin producing E coli (STEC) NOT DETECTED NOT DETECTED Final   Shigella/Enteroinvasive E coli (EIEC) NOT DETECTED NOT DETECTED Final   Cryptosporidium NOT DETECTED NOT DETECTED Final   Cyclospora cayetanensis NOT DETECTED NOT DETECTED Final   Entamoeba histolytica NOT DETECTED NOT DETECTED Final   Giardia lamblia NOT DETECTED NOT DETECTED Final   Adenovirus F40/41 NOT DETECTED NOT DETECTED Final   Astrovirus NOT DETECTED NOT DETECTED Final   Norovirus  GI/GII NOT DETECTED NOT DETECTED Final   Rotavirus A NOT DETECTED NOT DETECTED Final   Sapovirus (I, II, IV, and V) NOT DETECTED NOT DETECTED Final    Comment: Performed at [facility], [address], [place], [place] [address]  C Difficile Quick Screen w PCR reflex     Status: None   Collection Time: 07/25/23  6:05 PM   Specimen: STOOL  Result Value Ref Range Status   C Diff antigen NEGATIVE NEGATIVE Final   C Diff toxin NEGATIVE NEGATIVE Final   C Diff interpretation No C. difficile detected.  Final    Comment: Performed at [facility], [address] [place], [place] [address]     Labs: BNP (last 3 results) No results for input(s): "BNP" in the last 8760 hours. Basic Metabolic Panel: Recent Labs  Lab 07/25/23 0058 07/25/23 0529 07/26/23 0434 07/27/23 0648  NA 140 139 139 134*  K 3.7 3.5 3.4* 3.3*  CL 107 108 108 108  CO2 24 23 23 22   GLUCOSE 132* 139* 110* 94  BUN 20 20 19 14   CREATININE 0.98 0.84 0.88 0.71  CALCIUM 9.7 8.7* 8.6* 8.3*  MG 2.1  --   --   --    Liver Function Tests: Recent Labs  Lab 07/25/23 0058 07/26/23 0434  AST 40 27  ALT 31 22  ALKPHOS  275* 191*  BILITOT 0.7 1.1  PROT 7.4 6.6  ALBUMIN 3.4* 2.9*   No results for input(s): "LIPASE", "AMYLASE" in the last 168 hours. No results for input(s): "AMMONIA" in the last 168 hours. CBC: Recent Labs  Lab 07/25/23 0058 07/25/23 0529 07/25/23 1144 07/26/23 0434 07/27/23 0648  WBC 10.5 13.6* 15.1* 16.0* 12.9*  NEUTROABS 8.4*  --   --  13.3*  --   HGB 12.8 12.4 12.6 11.8* 11.6*  HCT 38.6 37.4 38.2 35.5* 33.6*  MCV 93.9 93.3 92.7 93.4 89.6  PLT 158 144* 147* 147* 153   Cardiac Enzymes: No results for input(s): "CKTOTAL", "CKMB", "CKMBINDEX", "TROPONINI" in the last 168 hours. BNP: Invalid input(s): "POCBNP" CBG: Recent Labs  Lab 07/26/23 0451 07/27/23 0434  GLUCAP 107* 86   D-Dimer Recent Labs    07/25/23 0529  DDIMER 12.03*   Hgb A1c No results for input(s): "HGBA1C" in the last 72 hours. Lipid Profile No results for input(s): "CHOL", "HDL", "LDLCALC", "TRIG", "CHOLHDL", "LDLDIRECT" in the last 72 hours. Thyroid function studies No results for input(s): "TSH", "T4TOTAL", "T3FREE", "THYROIDAB" in the last 72 hours.  Invalid input(s): "FREET3" Anemia work up No results for input(s): "VITAMINB12", "FOLATE", "FERRITIN", "TIBC", "IRON", "RETICCTPCT" in the last 72 hours. Urinalysis    Component Value Date/Time   COLORURINE YELLOW (A) 07/25/2023 0210   APPEARANCEUR CLEAR (A) 07/25/2023 0210   APPEARANCEUR Cloudy (A) 11/08/2019 0858   LABSPEC 1.006 07/25/2023 0210   PHURINE 5.0 07/25/2023 0210   GLUCOSEU NEGATIVE 07/25/2023 0210   HGBUR NEGATIVE 07/25/2023 0210   BILIRUBINUR NEGATIVE 07/25/2023 0210   BILIRUBINUR Negative 08/02/2022 1506   BILIRUBINUR Negative 11/08/2019 0858   KETONESUR NEGATIVE 07/25/2023 0210   PROTEINUR NEGATIVE 07/25/2023 0210   UROBILINOGEN 0.2 08/02/2022 1506   NITRITE NEGATIVE 07/25/2023 0210   LEUKOCYTESUR NEGATIVE 07/25/2023 0210   Sepsis Labs Recent Labs  Lab 07/25/23 0529 07/25/23 1144 07/26/23 0434 07/27/23 0648  WBC  13.6* 15.1* 16.0* 12.9*   Microbiology Recent Results (from the past 240 hours)  Gastrointestinal Panel by PCR , Stool     Status: None  Collection Time: 07/25/23  6:05 PM   Specimen: Stool  Result Value Ref Range Status   Campylobacter species NOT DETECTED NOT DETECTED Final   Plesimonas shigelloides NOT DETECTED NOT DETECTED Final   Salmonella species NOT DETECTED NOT DETECTED Final   Yersinia enterocolitica NOT DETECTED NOT DETECTED Final   Vibrio species NOT DETECTED NOT DETECTED Final   Vibrio cholerae NOT DETECTED NOT DETECTED Final   Enteroaggregative E coli (EAEC) NOT DETECTED NOT DETECTED Final   Enteropathogenic E coli (EPEC) NOT DETECTED NOT DETECTED Final   Enterotoxigenic E coli (ETEC) NOT DETECTED NOT DETECTED Final   Shiga like toxin producing E coli (STEC) NOT DETECTED NOT DETECTED Final   Shigella/Enteroinvasive E coli (EIEC) NOT DETECTED NOT DETECTED Final   Cryptosporidium NOT DETECTED NOT DETECTED Final   Cyclospora cayetanensis NOT DETECTED NOT DETECTED Final   Entamoeba histolytica NOT DETECTED NOT DETECTED Final   Giardia lamblia NOT DETECTED NOT DETECTED Final   Adenovirus F40/41 NOT DETECTED NOT DETECTED Final   Astrovirus NOT DETECTED NOT DETECTED Final   Norovirus GI/GII NOT DETECTED NOT DETECTED Final   Rotavirus A NOT DETECTED NOT DETECTED Final   Sapovirus (I, II, IV, and V) NOT DETECTED NOT DETECTED Final    Comment: Performed at [facility] [address] [place], [place] [address]  C Difficile Quick Screen w PCR reflex     Status: None   Collection Time: 07/25/23  6:05 PM   Specimen: STOOL  Result Value Ref Range Status   C Diff antigen NEGATIVE NEGATIVE Final   C Diff toxin NEGATIVE NEGATIVE Final   C Diff interpretation No C. difficile detected.  Final    Comment: Performed at [facility], [address] [place], [place] [address]   Imaging CT ABDOMEN PELVIS W CONTRAST Result Date: 07/26/2023 CLINICAL DATA:  Abdominal  pain and cramping. Nausea and diarrhea. Leukocytosis. EXAM: CT ABDOMEN AND PELVIS WITH CONTRAST TECHNIQUE: Multidetector CT imaging of the abdomen and pelvis was performed using the standard protocol following bolus administration of intravenous contrast. RADIATION DOSE REDUCTION: This exam was performed according to the departmental dose-optimization program which includes automated exposure control, adjustment of the mA and/or kV according to patient size and/or use of iterative reconstruction technique. CONTRAST:  OMNIPAQUE IOHEXOL 300 MG/ML  SOLN COMPARISON:  10/11/2019 FINDINGS: Lower Chest: Tiny bilateral pleural effusions and dependent bibasilar atelectasis. Hepatobiliary: Increased caudate hypertrophy and mild capsular nodularity, consistent with cirrhosis. Small hepatic cysts remains stable. No suspicious hepatic masses identified. Layering gallbladder sludge or tiny gallstones noted. No signs of cholecystitis or biliary ductal dilatation. Pancreas:  No mass or inflammatory changes. Spleen: Within normal limits in size and appearance. Adrenals/Urinary Tract: No suspicious masses identified. No evidence of ureteral calculi or hydronephrosis. Stomach/Bowel: Moderate wall thickening is seen throughout the length of the descending and sigmoid colon, consistent with colitis. No evidence of perforation or abscess. Mild ascites noted. Rectal surgical anastomosis is again seen. A fluid collection adjacent to the right lateral wall of the rectum is again seen measuring 4.4 x 3.7 cm, without significant change since previous study, and likely postop in etiology. Vascular/Lymphatic: No pathologically enlarged lymph nodes. No acute vascular findings. Reproductive:  No mass or other significant abnormality. Other:  None. Musculoskeletal:  No suspicious bone lesions identified. IMPRESSION: Moderate colitis involving the descending and sigmoid colon. No evidence of perforation or abscess. Mild ascites. Hepatic  cirrhosis. No evidence of hepatic neoplasm. Layering gallbladder sludge or tiny gallstones. No radiographic evidence  of cholecystitis. Tiny bilateral pleural effusions and dependent bibasilar atelectasis. Electronically Signed   By: Danae Orleans M.D.   On: 07/26/2023 09:34   ECHOCARDIOGRAM COMPLETE Result Date: 07/25/2023    ECHOCARDIOGRAM REPORT   Patient Name:   LAWSYN HEILER Date of Exam: 07/25/2023 Medical Rec #:  [id number]      Height:       63.7 in Accession #:    [id number]     Weight:       167.6 lb Date of Birth:  Dec 26, 1945      BSA:          1.809 m Patient Age:    77 years       BP:           154/78 mmHg Patient Gender: F              HR:           76 bpm. Exam Location:  [facility] Procedure: 2D Echo, Cardiac Doppler and Color Doppler (Both Spectral and Color            Flow Doppler were utilized during procedure). Indications:     Syncope  History:         Patient has prior history of Echocardiogram examinations, most                  recent 12/09/2019. Endocarditis, Signs/Symptoms:Syncope and                  Murmur; Risk Factors:Hypertension, Dyslipidemia and Former                  Smoker. Rheumatic fever.  Sonographer:     Mikki Harbor Referring Phys:  [id number] JAN A MANSY Diagnosing Phys: Debbe Odea MD  Sonographer Comments: Image acquisition challenging due to respiratory motion. IMPRESSIONS  1. Left ventricular ejection fraction, by estimation, is 60 to 65%. The left ventricle has normal function. The left ventricle has no regional wall motion abnormalities. There is mild left ventricular hypertrophy. Left ventricular diastolic parameters were normal.  2. Right ventricular systolic function is normal. The right ventricular size is normal. There is normal pulmonary artery systolic pressure.  3. The mitral valve is normal in structure. No evidence of mitral valve regurgitation.  4. The aortic valve is tricuspid. Aortic valve regurgitation is not visualized. Aortic valve sclerosis is present,  with no evidence of aortic valve stenosis.  5. The inferior vena cava is normal in size with greater than 50% respiratory variability, suggesting right atrial pressure of 3 mmHg. FINDINGS  Left Ventricle: Left ventricular ejection fraction, by estimation, is 60 to 65%. The left ventricle has normal function. The left ventricle has no regional wall motion abnormalities. Strain was performed and the global longitudinal strain is indeterminate. The left ventricular internal cavity size was normal in size. There is mild left ventricular hypertrophy. Left ventricular diastolic parameters were normal. Right Ventricle: The right ventricular size is normal. No increase in right ventricular wall thickness. Right ventricular systolic function is normal. There is normal pulmonary artery systolic pressure. The tricuspid regurgitant velocity is 2.76 m/s, and  with an assumed right atrial pressure of 3 mmHg, the estimated right ventricular systolic pressure is 33.5 mmHg. Left Atrium: Left atrial size was normal in size. Right Atrium: Right atrial size was normal in size. Pericardium: There is no evidence of pericardial effusion. Mitral Valve: The mitral valve is normal in structure. No evidence of mitral valve regurgitation. MV peak  gradient, 3.1 mmHg. The mean mitral valve gradient is 1.0 mmHg. Tricuspid Valve: The tricuspid valve is normal in structure. Tricuspid valve regurgitation is mild. Aortic Valve: The aortic valve is tricuspid. Aortic valve regurgitation is not visualized. Aortic valve sclerosis is present, with no evidence of aortic valve stenosis. Aortic valve mean gradient measures 3.0 mmHg. Aortic valve peak gradient measures 6.8  mmHg. Aortic valve area, by VTI measures 2.57 cm. Pulmonic Valve: The pulmonic valve was normal in structure. Pulmonic valve regurgitation is mild. Aorta: The aortic root is normal in size and structure. Venous: The inferior vena cava is normal in size with greater than 50% respiratory  variability, suggesting right atrial pressure of 3 mmHg. IAS/Shunts: No atrial level shunt detected by color flow Doppler. Additional Comments: 3D was performed not requiring image post processing on an independent workstation and was indeterminate.  LEFT VENTRICLE PLAX 2D LVIDd:         4.50 cm   Diastology LVIDs:         2.50 cm   LV e' medial:    10.80 cm/s LV PW:         1.00 cm   LV E/e' medial:  6.3 LV IVS:        1.20 cm   LV e' lateral:   10.20 cm/s LVOT diam:     1.90 cm   LV E/e' lateral: 6.7 LV SV:         67 LV SV Index:   37 LVOT Area:     2.84 cm  RIGHT VENTRICLE RV Basal diam:  3.70 cm RV Mid diam:    3.10 cm RV S prime:     15.70 cm/s LEFT ATRIUM             Index        RIGHT ATRIUM           Index LA diam:        4.10 cm 2.27 cm/m   RA Area:     21.30 cm LA Vol (A2C):   57.4 ml 31.73 ml/m  RA Volume:   65.90 ml  36.43 ml/m LA Vol (A4C):   52.7 ml 29.13 ml/m LA Biplane Vol: 57.2 ml 31.62 ml/m  AORTIC VALVE                    PULMONIC VALVE AV Area (Vmax):    2.31 cm     PV Vmax:       0.98 m/s AV Area (Vmean):   2.34 cm     PV Peak grad:  3.8 mmHg AV Area (VTI):     2.57 cm AV Vmax:           130.00 cm/s AV Vmean:          78.800 cm/s AV VTI:            0.260 m AV Peak Grad:      6.8 mmHg AV Mean Grad:      3.0 mmHg LVOT Vmax:         106.00 cm/s LVOT Vmean:        65.100 cm/s LVOT VTI:          0.236 m LVOT/AV VTI ratio: 0.91  AORTA Ao Root diam: 3.60 cm MITRAL VALVE               TRICUSPID VALVE MV Area (PHT): 3.19 cm    TR Peak grad:   30.5 mmHg MV Area  VTI:   3.25 cm    TR Vmax:        276.00 cm/s MV Peak grad:  3.1 mmHg MV Mean grad:  1.0 mmHg    SHUNTS MV Vmax:       0.88 m/s    Systemic VTI:  0.24 m MV Vmean:      49.8 cm/s   Systemic Diam: 1.90 cm MV Decel Time: 238 msec MV E velocity: 68.40 cm/s MV A velocity: 79.40 cm/s MV E/A ratio:  0.86 Debbe Odea MD Electronically signed by Debbe Odea MD Signature Date/Time: 07/25/2023/4:00:39 PM    Final    US Carotid  Bilateral Result Date: 07/25/2023 CLINICAL DATA:  Syncope EXAM: BILATERAL CAROTID DUPLEX ULTRASOUND TECHNIQUE: Wallace Cullens scale imaging, color Doppler and duplex ultrasound were performed of bilateral carotid and vertebral arteries in the neck. COMPARISON:  None Available. FINDINGS: Criteria: Quantification of carotid stenosis is based on velocity parameters that correlate the residual internal carotid diameter with NASCET-based stenosis levels, using the diameter of the distal internal carotid lumen as the denominator for stenosis measurement. The following velocity measurements were obtained: RIGHT ICA: 91 cm/sec CCA: 51 cm/sec SYSTOLIC ICA/CCA RATIO:  1.8 ECA: 81 cm/sec LEFT ICA: 93 cm/sec CCA: 62 cm/sec SYSTOLIC ICA/CCA RATIO:  1.5 ECA: 88 cm/sec RIGHT CAROTID ARTERY: Antegrade flow with slight intimal thickening and plaque RIGHT VERTEBRAL ARTERY:  Antegrade flow LEFT CAROTID ARTERY: Antegrade flow with slight intimal thickening and plaque LEFT VERTEBRAL ARTERY:  Antegrade IMPRESSION: No significant stenosis of either internal carotid artery. Mild in wall thickening and plaque. Electronically Signed   By: Karen Kays M.D.   On: 07/25/2023 11:35   CT Angio Chest Pulmonary Embolism (PE) W or WO Contrast Result Date: 07/25/2023 CLINICAL DATA:  Syncope. EXAM: CT ANGIOGRAPHY CHEST WITH CONTRAST TECHNIQUE: Multidetector CT imaging of the chest was performed using the standard protocol during bolus administration of intravenous contrast. Multiplanar CT image reconstructions and MIPs were obtained to evaluate the vascular anatomy. RADIATION DOSE REDUCTION: This exam was performed according to the departmental dose-optimization program which includes automated exposure control, adjustment of the mA and/or kV according to patient size and/or use of iterative reconstruction technique. CONTRAST:  75mL OMNIPAQUE IOHEXOL 350 MG/ML SOLN COMPARISON:  None Available. FINDINGS: Cardiovascular: Satisfactory opacification of the  pulmonary arteries to the segmental level. No evidence of pulmonary embolism. Normal heart size. No pericardial effusion. Mediastinum/Nodes: No enlarged mediastinal, hilar, or axillary lymph nodes. Thyroid gland, trachea, and esophagus demonstrate no significant findings. Lungs/Pleura: No pneumothorax or pleural effusion is noted. Emphysematous disease is noted. Minimal bilateral posterior basilar subsegmental atelectasis is noted. 17 x 13 mm ill-defined subpleural density is noted in left upper lobe most consistent with scarring or possibly focal inflammation. Upper Abdomen: No acute abnormality. Musculoskeletal: No chest wall abnormality. No acute or significant osseous findings. Review of the MIP images confirms the above findings. IMPRESSION: No definite evidence of pulmonary embolus. 17 x 13 mm ill-defined subpleural density noted anteriorly in left upper lobe most consistent with focal scarring or possibly focal inflammation, although other pathology cannot be excluded. Initial follow-up by chest CT without contrast is recommended in 3 months to confirm persistence. This recommendation follows the consensus statement: Recommendations for the Management of Subsolid Pulmonary Nodules Detected at CT: A Statement from the Fleischner Society as published in Radiology 2013; 266:304-317. Aortic Atherosclerosis (ICD10-I70.0) and Emphysema (ICD10-J43.9). Electronically Signed   By: Lupita Raider M.D.   On: 07/25/2023 10:19   US Venous Img Lower  Bilateral (DVT) Result Date: 07/25/2023 CLINICAL DATA:  Lower extremity pain and elevated D-dimer. EXAM: BILATERAL LOWER EXTREMITY VENOUS DOPPLER ULTRASOUND TECHNIQUE: Gray-scale sonography with graded compression, as well as color Doppler and duplex ultrasound were performed to evaluate the lower extremity deep venous systems from the level of the common femoral vein and including the common femoral, femoral, profunda femoral, popliteal and calf veins including the  posterior tibial, peroneal and gastrocnemius veins when visible. The superficial great saphenous vein was also interrogated. Spectral Doppler was utilized to evaluate flow at rest and with distal augmentation maneuvers in the common femoral, femoral and popliteal veins. COMPARISON:  None Available. FINDINGS: RIGHT LOWER EXTREMITY Common Femoral Vein: No evidence of thrombus. Normal compressibility, respiratory phasicity and response to augmentation. Saphenofemoral Junction: No evidence of thrombus. Normal compressibility and flow on color Doppler imaging. Profunda Femoral Vein: No evidence of thrombus. Normal compressibility and flow on color Doppler imaging. Femoral Vein: No evidence of thrombus. Normal compressibility, respiratory phasicity and response to augmentation. Popliteal Vein: No evidence of thrombus. Normal compressibility, respiratory phasicity and response to augmentation. Calf Veins: No evidence of thrombus. Normal compressibility and flow on color Doppler imaging. Superficial Great Saphenous Vein: No evidence of thrombus. Normal compressibility. Venous Reflux:  None. Other Findings: No evidence of superficial thrombophlebitis or abnormal fluid collection. LEFT LOWER EXTREMITY Common Femoral Vein: No evidence of thrombus. Normal compressibility, respiratory phasicity and response to augmentation. Saphenofemoral Junction: No evidence of thrombus. Normal compressibility and flow on color Doppler imaging. Profunda Femoral Vein: No evidence of thrombus. Normal compressibility and flow on color Doppler imaging. Femoral Vein: No evidence of thrombus. Normal compressibility, respiratory phasicity and response to augmentation. Popliteal Vein: No evidence of thrombus. Normal compressibility, respiratory phasicity and response to augmentation. Calf Veins: No evidence of thrombus. Normal compressibility and flow on color Doppler imaging. Superficial Great Saphenous Vein: No evidence of thrombus. Normal  compressibility. Venous Reflux:  None. Other Findings: No evidence of superficial thrombophlebitis or abnormal fluid collection. IMPRESSION: No evidence of deep venous thrombosis in either lower extremity. Electronically Signed   By: Irish Lack M.D.   On: 07/25/2023 09:38   CT Head Wo Contrast Result Date: 07/25/2023 CLINICAL DATA:  Head trauma, minor (Age >= 65y) EXAM: CT HEAD WITHOUT CONTRAST TECHNIQUE: Contiguous axial images were obtained from the base of the skull through the vertex without intravenous contrast. RADIATION DOSE REDUCTION: This exam was performed according to the departmental dose-optimization program which includes automated exposure control, adjustment of the mA and/or kV according to patient size and/or use of iterative reconstruction technique. COMPARISON:  MRI March 20, 2021. FINDINGS: Brain: No evidence of acute infarction, hemorrhage, hydrocephalus, extra-axial collection or midline shift. Approximately 1 cm calcified extra-axial dural-based lesion along the anterior right falx, compatible with a meningioma. 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 Minimal mass effect on the adjacent brain. Without patchy white matter hypodensities, compatible with chronic microvascular ischemic change. Vascular: Calcific atherosclerosis.  No hyperdense vessel. Skull: No acute fracture. Sinuses/Orbits: Clear sinuses. IMPRESSION: 1. No evidence of acute intracranial abnormality. 2. Similar right anterior parafalcine meningioma. 3. 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 Electronically Signed   By: Feliberto Harts M.D.   On: 07/25/2023 02:59      Time coordinating discharge: over 30 minutes  SIGNED:  Sunnie Nielsen DO [facility]

## 2023-07-28 ENCOUNTER — Telehealth: Payer: Self-pay

## 2023-07-28 NOTE — Transitions of Care (Post Inpatient/ED Visit) (Unsigned)
   07/28/2023  Name: Vanessa Reynolds MRN: [id number] DOB: 1945-09-06  Today's TOC FU Call Status: Today's TOC FU Call Status:: Unsuccessful Call (1st Attempt) Unsuccessful Call (1st Attempt) Date: 07/28/23  Attempted to reach the patient regarding the most recent Inpatient/ED visit.  Follow Up Plan: Additional outreach attempts will be made to reach the patient to complete the Transitions of Care (Post Inpatient/ED visit) call.   Signature Karena Addison, LPN [facility] Nurse Health Advisor Direct Dial [phone]

## 2023-07-30 NOTE — Transitions of Care (Post Inpatient/ED Visit) (Signed)
 07/30/2023  Name: Vanessa Reynolds MRN: [id number] DOB: 23-Jun-1945  Today's TOC FU Call Status: Today's TOC FU Call Status:: Successful TOC FU Call Completed Unsuccessful Call (1st Attempt) Date: 07/28/23 Unsuccessful Call (2nd Attempt) Date: 07/30/23 [facility] FU Call Complete Date: 07/30/23 Patient's Name and Date of Birth confirmed.  Transition Care Management Follow-up Telephone Call Date of Discharge: 07/27/23 Discharge Facility: [facility]) Type of Discharge: Inpatient Admission Primary Inpatient Discharge Diagnosis:: syncope How have you been since you were released from the hospital?: Better Any questions or concerns?: No  Items Reviewed: Did you receive and understand the discharge instructions provided?: Yes Medications obtained,verified, and reconciled?: Yes (Medications Reviewed) Any new allergies since your discharge?: No Dietary orders reviewed?: Yes Do you have support at home?: Yes People in Home: spouse  Medications Reviewed Today: Medications Reviewed Today     Reviewed by Karena Addison, LPN (Licensed Practical Nurse) on 07/30/23 at 1442  Med List Status: <None>   Medication Order Taking? Sig Documenting Provider Last Dose Status Informant  ciprofloxacin (CIPRO) 500 MG tablet [id number]  Take 1 tablet (500 mg total) by mouth 2 (two) times daily for 13 doses. Starting evening 07/27/2023 Sunnie Nielsen, DO  Active   fluticasone [facility]) 50 MCG/ACT nasal spray [id number] No PLACE 2 SPRAYS INTO BOTH NOSTRILS DAILY.USE FOR 4-6 WEEKS THEN STOP AND USE SEASONALLY OR AS NEEDED Smitty Cords, DO Taking Active Self           Med Note Katrinka Blazing, JOYCE   Fri Jul 25, 2023  6:18 AM) prn  lisinopril (ZESTRIL) 40 MG tablet [id number] No Take 1 tablet (40 mg total) by mouth at bedtime. Smitty Cords, DO 07/24/2023 Active Self  loratadine (CLARITIN) 10 MG tablet [id number] No Take 1 tablet (10 mg total) by mouth daily. Use for 4-6 weeks  then stop, and use as needed or seasonally Smitty Cords, DO Not Taking Active Self           Med Note Katrinka Blazing, JOYCE   Fri Jul 25, 2023  6:16 AM) prn  lovastatin (MEVACOR) 20 MG tablet [id number] No Take 1 tablet (20 mg total) by mouth at bedtime. Smitty Cords, DO 07/23/2023 Active Self  metroNIDAZOLE (FLAGYL) 500 MG tablet [id number]  Take 1 tablet (500 mg total) by mouth 3 (three) times daily for 17 doses. Starting afternoon 07/27/23 Sunnie Nielsen, DO  Active   omeprazole (PRILOSEC) 20 MG capsule [id number] No Take 1 capsule (20 mg total) by mouth daily before breakfast. Smitty Cords, DO 07/24/2023 Active Self  ondansetron (ZOFRAN) 4 MG tablet [id number]  Take 1 tablet (4 mg total) by mouth every 6 (six) hours as needed for nausea. Sunnie Nielsen, DO  Active   vitamin B-12 (CYANOCOBALAMIN) 100 MCG tablet [id number] No Take 100 mcg by mouth daily. [provider] 07/24/2023 Active Self            Home Care and Equipment/Supplies: Were Home Health Services Ordered?: NA Any new equipment or medical supplies ordered?: NA  Functional Questionnaire: Do you need assistance with bathing/showering or dressing?: Yes Do you need assistance with meal preparation?: Yes Do you need assistance with eating?: No Do you have difficulty maintaining continence: No Do you need assistance with getting out of bed/getting out of a chair/moving?: No Do you have difficulty managing or taking your medications?: No  Follow up appointments reviewed: PCP Follow-up appointment confirmed?: Yes Date of PCP follow-up appointment?: 08/01/23 Follow-up Provider: [facility] Follow-up appointment  confirmed?: NA Do you need transportation to your follow-up appointment?: No Do you understand care options if your condition(s) worsen?: Yes-patient verbalized understanding    SIGNATURE Karena Addison, LPN [facility] Nurse Health Advisor Direct Dial [phone]

## 2023-07-30 NOTE — Transitions of Care (Post Inpatient/ED Visit) (Signed)
   07/30/2023  Name: Vanessa Reynolds MRN: [id number] DOB: 1945/09/24  Today's TOC FU Call Status: Today's TOC FU Call Status:: Unsuccessful Call (2nd Attempt) Unsuccessful Call (1st Attempt) Date: 07/28/23 Unsuccessful Call (2nd Attempt) Date: 07/30/23  Attempted to reach the patient regarding the most recent Inpatient/ED visit.  Follow Up Plan: Additional outreach attempts will be made to reach the patient to complete the Transitions of Care (Post Inpatient/ED visit) call.   Signature Karena Addison, LPN [facility] Nurse Health Advisor Direct Dial [phone]

## 2023-08-01 ENCOUNTER — Ambulatory Visit: Admitting: Family Medicine

## 2023-08-01 ENCOUNTER — Encounter: Payer: Self-pay | Admitting: Family Medicine

## 2023-08-01 VITALS — BP 130/78 | HR 70 | Ht 63.75 in | Wt 166.0 lb

## 2023-08-01 DIAGNOSIS — K219 Gastro-esophageal reflux disease without esophagitis: Secondary | ICD-10-CM | POA: Diagnosis not present

## 2023-08-01 DIAGNOSIS — I10 Essential (primary) hypertension: Secondary | ICD-10-CM

## 2023-08-01 DIAGNOSIS — R55 Syncope and collapse: Secondary | ICD-10-CM

## 2023-08-01 DIAGNOSIS — R110 Nausea: Secondary | ICD-10-CM

## 2023-08-01 MED ORDER — OMEPRAZOLE 40 MG PO CPDR: 40.0000 mg | DELAYED_RELEASE_CAPSULE | Freq: Every day | ORAL | 1 refills | Status: DC

## 2023-08-01 MED ORDER — ONDANSETRON 8 MG PO TBDP: 8.0000 mg | ORAL_TABLET | Freq: Three times a day (TID) | ORAL | 2 refills | Status: DC | PRN

## 2023-08-01 NOTE — Patient Instructions (Addendum)
 Thank you for coming to the office today.  Heart doctors in May, ask them about ZIO Patch monitor and other testing.  Stop taking Lisinopril 40mg  daily - this BP med can reduce BP too much and make you feel lightheaded or dizzy BP seems controlled off of medication Monitor BP at home for now goal < 140 / 90  Keep taking Lovastatin  Dose increase Omeprazole from 20 to 40mg  daily before breakfast  Consider remedy options  IBGard OTC Peppermint Oil (Triple Coated Capsule) 180mg  take one 3 times daily to reduce diarrhea, gas, bloating  Increase dose Nauesa medicine to 8mg  as needed  If digestion not improving, please contact us and we can refer to GI   Please schedule a Follow-up Appointment to: Return if symptoms worsen or fail to improve.  If you have any other questions or concerns, please feel free to call the office or send a message through MyChart. You may also schedule an earlier appointment if necessary.  Additionally, you may be receiving a survey about your experience at our office within a few days to 1 week by e-mail or mail. We value your feedback.  Saralyn Pilar, DO [facility], [place]

## 2023-08-01 NOTE — Progress Notes (Signed)
 Subjective:    Patient ID: Vanessa Reynolds, female    DOB: [birth year], 78 y.o.   MRN: [id number]  Vanessa Reynolds is a 78 y.o. female presenting on 08/01/2023 for Loss of Consciousness   HPI  Discussed the use of AI scribe software for clinical note transcription with the patient, who gave verbal consent to proceed.  History of Present Illness   Vanessa Reynolds is a 78 year old female who presents with concerns about acid reflux and recent hospitalization for a syncopal episode.   HOSPITAL FOLLOW-UP VISIT  Hospital/Location: [facility] Date of Admission: 07/25/23 Date of Discharge: 07/27/23 Transitions of care telephone call: Completed 07/28/23 Karena Addison LPN  Reason for Admission: Syncope  [facility] H&P and Discharge Summary have been reviewed - Patient presents today 5 days after recent hospitalization  She was hospitalized a week ago for a syncopal episode, staying for three nights. During her hospital stay, she underwent extensive testing including echocardiogram, CT scan, blood work, and ultrasound. The initial thought was a syncopal episode possibly due to dehydration, diarrhea, or colitis. She was treated with several antibiotics.  She is currently not taking her blood pressure medication, lisinopril 40 mg, as her blood pressure readings have been low since her hospital discharge. She monitors her blood pressure at home and reports fluctuating readings, sometimes low.  She experiences ongoing issues with acid reflux, including frequent burping and regurgitation. Her current treatment with omeprazole 20 mg is insufficient, and she is seeking a stronger medication. She also experiences significant gas and bloating, which may be dietary or related to her acid reflux.  She mentions difficulty with bowel movements despite taking strong medications, requiring straining to pass stool.  She experiences frequent nausea and has been taking medication for it, but finds it ineffective. She is  interested in trying a stronger dose to help manage her symptoms.   On discharge they recommended Zio Patch order cardiac monitor. She is not ready to pursue this and prefers to talk to Cardiology at upcoming apt next month  She remains off of home Lisinopril 40mg  daily due to risk of hypotension. BP has been contorlled  - Today reports overall has done well after discharge. Symptoms of syncope have resolved   - New medications on discharge: Zofran ODT 4mg , Cipro - Changes to current meds on discharge: Stop Lisinopril 40mg   I have reviewed the discharge medication list, and have reconciled the current and discharge medications today.   Current Outpatient Medications:    ciprofloxacin (CIPRO) 500 MG tablet, Take 1 tablet (500 mg total) by mouth 2 (two) times daily for 13 doses. Starting evening 07/27/2023, Disp: 13 tablet, Rfl: 0   fluticasone (FLONASE) 50 MCG/ACT nasal spray, PLACE 2 SPRAYS INTO BOTH NOSTRILS DAILY.USE FOR 4-6 WEEKS THEN STOP AND USE SEASONALLY OR AS NEEDED, Disp: 48 g, Rfl: 1   lovastatin (MEVACOR) 20 MG tablet, Take 1 tablet (20 mg total) by mouth at bedtime., Disp: 90 tablet, Rfl: 3   metroNIDAZOLE (FLAGYL) 500 MG tablet, Take 1 tablet (500 mg total) by mouth 3 (three) times daily for 17 doses. Starting afternoon 07/27/23, Disp: 17 tablet, Rfl: 0   omeprazole (PRILOSEC) 40 MG capsule, Take 1 capsule (40 mg total) by mouth daily before breakfast., Disp: 90 capsule, Rfl: 1   ondansetron (ZOFRAN-ODT) 8 MG disintegrating tablet, Take 1 tablet (8 mg total) by mouth every 8 (eight) hours as needed for nausea or vomiting., Disp: 20 tablet, Rfl: 2   vitamin B-12 (  CYANOCOBALAMIN) 100 MCG tablet, Take 100 mcg by mouth daily., Disp: , Rfl:    loratadine (CLARITIN) 10 MG tablet, Take 1 tablet (10 mg total) by mouth daily. Use for 4-6 weeks then stop, and use as needed or seasonally (Patient not taking: Reported on 08/01/2023), Disp: 90 tablet, Rfl:  1  ------------------------------------------------------------------------- Social History   Tobacco Use   Smoking status: Former    Current packs/day: 0.00    Average packs/day: 2.0 packs/day for 30.0 years (60.0 ttl pk-yrs)    Types: Cigarettes    Start date: 05/21/1967    Quit date: 05/20/1997    Years since quitting: 26.2   Smokeless tobacco: Former  Building services engineer status: Never Used  Substance Use Topics   Alcohol use: No   Drug use: No    Review of Systems Per HPI unless specifically indicated above     Objective:    BP 130/78   Pulse 70   Ht 5' 3.75" (1.619 m)   Wt 166 lb (75.3 kg)   SpO2 99%   BMI 28.72 kg/m   Wt Readings from Last 3 Encounters:  08/01/23 166 lb (75.3 kg)  07/27/23 171 lb 15.3 oz (78 kg)  03/19/23 167 lb 9.6 oz (76 kg)    Physical Exam Vitals and nursing note reviewed.  Constitutional:      General: She is not in acute distress.    Appearance: Normal appearance. She is well-developed. She is not diaphoretic.     Comments: Well-appearing, comfortable, cooperative  HENT:     Head: Normocephalic and atraumatic.  Eyes:     General:        Right eye: No discharge.        Left eye: No discharge.     Conjunctiva/sclera: Conjunctivae normal.  Neck:     Thyroid: No thyromegaly.  Cardiovascular:     Rate and Rhythm: Normal rate and regular rhythm.     Heart sounds: Normal heart sounds. No murmur heard. Pulmonary:     Effort: Pulmonary effort is normal. No respiratory distress.     Breath sounds: Normal breath sounds. No wheezing or rales.  Musculoskeletal:        General: Normal range of motion.     Cervical back: Normal range of motion and neck supple.  Lymphadenopathy:     Cervical: No cervical adenopathy.  Skin:    General: Skin is warm and dry.     Findings: No erythema or rash.  Neurological:     Mental Status: She is alert and oriented to person, place, and time.  Psychiatric:        Mood and Affect: Mood normal.         Behavior: Behavior normal.        Thought Content: Thought content normal.     Comments: Well groomed, good eye contact, normal speech and thoughts       Results for orders placed or performed during the hospital encounter of 07/25/23  Comprehensive metabolic panel   Collection Time: 07/25/23 12:58 AM  Result Value Ref Range   Sodium 140 135 - 145 mmol/L   Potassium 3.7 3.5 - 5.1 mmol/L   Chloride 107 98 - 111 mmol/L   CO2 24 22 - 32 mmol/L   Glucose, Bld 132 (H) 70 - 99 mg/dL   BUN 20 8 - 23 mg/dL   Creatinine, Ser 6.96 0.44 - 1.00 mg/dL   Calcium 9.7 8.9 - 29.5 mg/dL   Total Protein  7.4 6.5 - 8.1 g/dL   Albumin 3.4 (L) 3.5 - 5.0 g/dL   AST 40 15 - 41 U/L   ALT 31 0 - 44 U/L   Alkaline Phosphatase 275 (H) 38 - 126 U/L   Total Bilirubin 0.7 0.0 - 1.2 mg/dL   GFR, Estimated 59 (L) >60 mL/min   Anion gap 9 5 - 15  CBC with Differential   Collection Time: 07/25/23 12:58 AM  Result Value Ref Range   WBC 10.5 4.0 - 10.5 K/uL   RBC 4.11 3.87 - 5.11 MIL/uL   Hemoglobin 12.8 12.0 - 15.0 g/dL   HCT 78.4 69.6 - 29.5 %   MCV 93.9 80.0 - 100.0 fL   MCH 31.1 26.0 - 34.0 pg   MCHC 33.2 30.0 - 36.0 g/dL   RDW 28.4 13.2 - 44.0 %   Platelets 158 150 - 400 K/uL   nRBC 0.0 0.0 - 0.2 %   Neutrophils Relative % 78 %   Neutro Abs 8.4 (H) 1.7 - 7.7 K/uL   Lymphocytes Relative 14 %   Lymphs Abs 1.5 0.7 - 4.0 K/uL   Monocytes Relative 4 %   Monocytes Absolute 0.4 0.1 - 1.0 K/uL   Eosinophils Relative 2 %   Eosinophils Absolute 0.2 0.0 - 0.5 K/uL   Basophils Relative 1 %   Basophils Absolute 0.1 0.0 - 0.1 K/uL   Immature Granulocytes 1 %   Abs Immature Granulocytes 0.05 0.00 - 0.07 K/uL  Magnesium   Collection Time: 07/25/23 12:58 AM  Result Value Ref Range   Magnesium 2.1 1.7 - 2.4 mg/dL  Troponin I (High Sensitivity)   Collection Time: 07/25/23 12:58 AM  Result Value Ref Range   Troponin I (High Sensitivity) 14 <18 ng/L  Urinalysis, w/ Reflex to Culture (Infection Suspected)  -Urine, Clean Catch   Collection Time: 07/25/23  2:10 AM  Result Value Ref Range   Specimen Source URINE, CLEAN CATCH    Color, Urine YELLOW (A) YELLOW   APPearance CLEAR (A) CLEAR   Specific Gravity, Urine 1.006 1.005 - 1.030   pH 5.0 5.0 - 8.0   Glucose, UA NEGATIVE NEGATIVE mg/dL   Hgb urine dipstick NEGATIVE NEGATIVE   Bilirubin Urine NEGATIVE NEGATIVE   Ketones, ur NEGATIVE NEGATIVE mg/dL   Protein, ur NEGATIVE NEGATIVE mg/dL   Nitrite NEGATIVE NEGATIVE   Leukocytes,Ua NEGATIVE NEGATIVE   RBC / HPF 0 0 - 5 RBC/hpf   WBC, UA 0-5 0 - 5 WBC/hpf   Bacteria, UA RARE (A) NONE SEEN   Squamous Epithelial / HPF 0-5 0 - 5 /HPF   Mucus PRESENT   Troponin I (High Sensitivity)   Collection Time: 07/25/23  2:10 AM  Result Value Ref Range   Troponin I (High Sensitivity) 27 (H) <18 ng/L  Troponin I (High Sensitivity)   Collection Time: 07/25/23  3:06 AM  Result Value Ref Range   Troponin I (High Sensitivity) 36 (H) <18 ng/L  D-dimer, quantitative   Collection Time: 07/25/23  5:29 AM  Result Value Ref Range   D-Dimer, Quant 12.03 (H) 0.00 - 0.50 ug/mL-FEU  Basic metabolic panel   Collection Time: 07/25/23  5:29 AM  Result Value Ref Range   Sodium 139 135 - 145 mmol/L   Potassium 3.5 3.5 - 5.1 mmol/L   Chloride 108 98 - 111 mmol/L   CO2 23 22 - 32 mmol/L   Glucose, Bld 139 (H) 70 - 99 mg/dL   BUN 20 8 - 23 mg/dL  Creatinine, Ser 0.84 0.44 - 1.00 mg/dL   Calcium 8.7 (L) 8.9 - 10.3 mg/dL   GFR, Estimated >13 >08 mL/min   Anion gap 8 5 - 15  CBC   Collection Time: 07/25/23  5:29 AM  Result Value Ref Range   WBC 13.6 (H) 4.0 - 10.5 K/uL   RBC 4.01 3.87 - 5.11 MIL/uL   Hemoglobin 12.4 12.0 - 15.0 g/dL   HCT 65.7 84.6 - 96.2 %   MCV 93.3 80.0 - 100.0 fL   MCH 30.9 26.0 - 34.0 pg   MCHC 33.2 30.0 - 36.0 g/dL   RDW 95.2 84.1 - 32.4 %   Platelets 144 (L) 150 - 400 K/uL   nRBC 0.0 0.0 - 0.2 %  CBC   Collection Time: 07/25/23 11:44 AM  Result Value Ref Range   WBC 15.1 (H) 4.0 -  10.5 K/uL   RBC 4.12 3.87 - 5.11 MIL/uL   Hemoglobin 12.6 12.0 - 15.0 g/dL   HCT 40.1 02.7 - 25.3 %   MCV 92.7 80.0 - 100.0 fL   MCH 30.6 26.0 - 34.0 pg   MCHC 33.0 30.0 - 36.0 g/dL   RDW 66.4 40.3 - 47.4 %   Platelets 147 (L) 150 - 400 K/uL   nRBC 0.0 0.0 - 0.2 %  Troponin I (High Sensitivity)   Collection Time: 07/25/23 11:44 AM  Result Value Ref Range   Troponin I (High Sensitivity) 31 (H) <18 ng/L  ECHOCARDIOGRAM COMPLETE   Collection Time: 07/25/23  3:55 PM  Result Value Ref Range   BP 145/75 mmHg   Ao pk vel 1.30 m/s   AV Area VTI 2.57 cm2   AR max vel 2.31 cm2   AV Mean grad 3.0 mmHg   AV Peak grad 6.8 mmHg   S' Lateral 2.50 cm   AV Area mean vel 2.34 cm2   Area-P 1/2 3.19 cm2   MV VTI 3.25 cm2   Est EF 60 - 65%   Gastrointestinal Panel by PCR , Stool   Collection Time: 07/25/23  6:05 PM   Specimen: Stool  Result Value Ref Range   Campylobacter species NOT DETECTED NOT DETECTED   Plesimonas shigelloides NOT DETECTED NOT DETECTED   Salmonella species NOT DETECTED NOT DETECTED   Yersinia enterocolitica NOT DETECTED NOT DETECTED   Vibrio species NOT DETECTED NOT DETECTED   Vibrio cholerae NOT DETECTED NOT DETECTED   Enteroaggregative E coli (EAEC) NOT DETECTED NOT DETECTED   Enteropathogenic E coli (EPEC) NOT DETECTED NOT DETECTED   Enterotoxigenic E coli (ETEC) NOT DETECTED NOT DETECTED   Shiga like toxin producing E coli (STEC) NOT DETECTED NOT DETECTED   Shigella/Enteroinvasive E coli (EIEC) NOT DETECTED NOT DETECTED   Cryptosporidium NOT DETECTED NOT DETECTED   Cyclospora cayetanensis NOT DETECTED NOT DETECTED   Entamoeba histolytica NOT DETECTED NOT DETECTED   Giardia lamblia NOT DETECTED NOT DETECTED   Adenovirus F40/41 NOT DETECTED NOT DETECTED   Astrovirus NOT DETECTED NOT DETECTED   Norovirus GI/GII NOT DETECTED NOT DETECTED   Rotavirus A NOT DETECTED NOT DETECTED   Sapovirus (I, II, IV, and V) NOT DETECTED NOT DETECTED  C Difficile Quick Screen w PCR  reflex   Collection Time: 07/25/23  6:05 PM   Specimen: STOOL  Result Value Ref Range   C Diff antigen NEGATIVE NEGATIVE   C Diff toxin NEGATIVE NEGATIVE   C Diff interpretation No C. difficile detected.   CBC with Differential/Platelet   Collection Time: 07/26/23  4:34 AM  Result Value Ref Range   WBC 16.0 (H) 4.0 - 10.5 K/uL   RBC 3.80 (L) 3.87 - 5.11 MIL/uL   Hemoglobin 11.8 (L) 12.0 - 15.0 g/dL   HCT 78.2 (L) 95.6 - 21.3 %   MCV 93.4 80.0 - 100.0 fL   MCH 31.1 26.0 - 34.0 pg   MCHC 33.2 30.0 - 36.0 g/dL   RDW 08.6 57.8 - 46.9 %   Platelets 147 (L) 150 - 400 K/uL   nRBC 0.0 0.0 - 0.2 %   Neutrophils Relative % 83 %   Neutro Abs 13.3 (H) 1.7 - 7.7 K/uL   Lymphocytes Relative 10 %   Lymphs Abs 1.6 0.7 - 4.0 K/uL   Monocytes Relative 6 %   Monocytes Absolute 0.9 0.1 - 1.0 K/uL   Eosinophils Relative 0 %   Eosinophils Absolute 0.0 0.0 - 0.5 K/uL   Basophils Relative 0 %   Basophils Absolute 0.0 0.0 - 0.1 K/uL   Immature Granulocytes 1 %   Abs Immature Granulocytes 0.10 (H) 0.00 - 0.07 K/uL  Comprehensive metabolic panel   Collection Time: 07/26/23  4:34 AM  Result Value Ref Range   Sodium 139 135 - 145 mmol/L   Potassium 3.4 (L) 3.5 - 5.1 mmol/L   Chloride 108 98 - 111 mmol/L   CO2 23 22 - 32 mmol/L   Glucose, Bld 110 (H) 70 - 99 mg/dL   BUN 19 8 - 23 mg/dL   Creatinine, Ser 6.29 0.44 - 1.00 mg/dL   Calcium 8.6 (L) 8.9 - 10.3 mg/dL   Total Protein 6.6 6.5 - 8.1 g/dL   Albumin 2.9 (L) 3.5 - 5.0 g/dL   AST 27 15 - 41 U/L   ALT 22 0 - 44 U/L   Alkaline Phosphatase 191 (H) 38 - 126 U/L   Total Bilirubin 1.1 0.0 - 1.2 mg/dL   GFR, Estimated >52 >84 mL/min   Anion gap 8 5 - 15  CBG monitoring, ED   Collection Time: 07/26/23  4:51 AM  Result Value Ref Range   Glucose-Capillary 107 (H) 70 - 99 mg/dL  Glucose, capillary   Collection Time: 07/27/23  4:34 AM  Result Value Ref Range   Glucose-Capillary 86 70 - 99 mg/dL  Basic metabolic panel   Collection Time: 07/27/23   6:48 AM  Result Value Ref Range   Sodium 134 (L) 135 - 145 mmol/L   Potassium 3.3 (L) 3.5 - 5.1 mmol/L   Chloride 108 98 - 111 mmol/L   CO2 22 22 - 32 mmol/L   Glucose, Bld 94 70 - 99 mg/dL   BUN 14 8 - 23 mg/dL   Creatinine, Ser 1.32 0.44 - 1.00 mg/dL   Calcium 8.3 (L) 8.9 - 10.3 mg/dL   GFR, Estimated >44 >01 mL/min   Anion gap 4 (L) 5 - 15  CBC   Collection Time: 07/27/23  6:48 AM  Result Value Ref Range   WBC 12.9 (H) 4.0 - 10.5 K/uL   RBC 3.75 (L) 3.87 - 5.11 MIL/uL   Hemoglobin 11.6 (L) 12.0 - 15.0 g/dL   HCT 02.7 (L) 25.3 - 66.4 %   MCV 89.6 80.0 - 100.0 fL   MCH 30.9 26.0 - 34.0 pg   MCHC 34.5 30.0 - 36.0 g/dL   RDW 40.3 47.4 - 25.9 %   Platelets 153 150 - 400 K/uL   nRBC 0.0 0.0 - 0.2 %      Assessment & Plan:  Problem List Items Addressed This Visit     Essential hypertension   Gastroesophageal reflux disease without esophagitis   Relevant Medications   omeprazole (PRILOSEC) 40 MG capsule   ondansetron (ZOFRAN-ODT) 8 MG disintegrating tablet   Syncope - Primary   Other Visit Diagnoses       Nausea       Relevant Medications   ondansetron (ZOFRAN-ODT) 8 MG disintegrating tablet       Syncope Syncopal episode possibly related to dehydration, diarrhea, or colitis. Extensive testing inconclusive. Cardiac evaluation scheduled. - Discuss the possibility of using a Zio patch monitor with the cardiologist during the May appointment.   GERD / Acid Reflux Constellation of GI symptoms, upper GI Ongoing symptoms with current omeprazole dose insufficient. Increasing dose expected to alleviate symptoms. - Increase omeprazole to 40 mg daily. - Consider Gas-X for bloating and gas. - Consider peppermint oil capsules (Ibogard) for digestive symptoms. - If symptoms persist, consider referral to a gastroenterologist.  Nausea Frequent nausea with current ondansetron dose insufficient. Increasing dose expected to improve symptoms. - Increase ondansetron to 8 mg as  needed. - If nausea persists, consider referral to a gastroenterologist.  Hypertension Low blood pressure readings and recent syncopal episode led to discontinuation of lisinopril to avoid hypotension. - Discontinue lisinopril 40mg  - Monitor blood pressure at home.  Hyperlipidemia On lovastatin for cholesterol management. - Continue lovastatin as prescribed.        Meds ordered this encounter  Medications   omeprazole (PRILOSEC) 40 MG capsule    Sig: Take 1 capsule (40 mg total) by mouth daily before breakfast.    Dispense:  90 capsule    Refill:  1   ondansetron (ZOFRAN-ODT) 8 MG disintegrating tablet    Sig: Take 1 tablet (8 mg total) by mouth every 8 (eight) hours as needed for nausea or vomiting.    Dispense:  20 tablet    Refill:  2    Follow up plan: Return if symptoms worsen or fail to improve.   Saralyn Pilar, DO [facility] 08/01/2023, 3:05 PM

## 2023-08-20 ENCOUNTER — Ambulatory Visit (INDEPENDENT_AMBULATORY_CARE_PROVIDER_SITE_OTHER): Admitting: Family Medicine

## 2023-08-20 ENCOUNTER — Other Ambulatory Visit: Payer: Self-pay | Admitting: Family Medicine

## 2023-08-20 ENCOUNTER — Encounter: Payer: Self-pay | Admitting: Family Medicine

## 2023-08-20 VITALS — BP 110/68 | HR 80 | Ht 63.75 in | Wt 161.0 lb

## 2023-08-20 DIAGNOSIS — K219 Gastro-esophageal reflux disease without esophagitis: Secondary | ICD-10-CM

## 2023-08-20 DIAGNOSIS — I10 Essential (primary) hypertension: Secondary | ICD-10-CM | POA: Diagnosis not present

## 2023-08-20 DIAGNOSIS — Z87898 Personal history of other specified conditions: Secondary | ICD-10-CM

## 2023-08-20 DIAGNOSIS — R110 Nausea: Secondary | ICD-10-CM

## 2023-08-20 DIAGNOSIS — E7849 Other hyperlipidemia: Secondary | ICD-10-CM

## 2023-08-20 DIAGNOSIS — R0789 Other chest pain: Secondary | ICD-10-CM | POA: Diagnosis not present

## 2023-08-20 DIAGNOSIS — R002 Palpitations: Secondary | ICD-10-CM

## 2023-08-20 MED ORDER — SUCRALFATE 1 G PO TABS: 1.0000 g | ORAL_TABLET | Freq: Three times a day (TID) | ORAL | 0 refills | Status: DC

## 2023-08-20 NOTE — Patient Instructions (Addendum)
 Thank you for coming to the office today.  BP improved, but still occasionally low Keep monitoring BP, goal 110-120s If < 100 it is okay as long as you feel fine If lightheaded, need to increase sodium salt and water hydration  I will contact your upcoming Cardiologist office and see if they can work you in any quicker.   For indigestion and gastric symptoms, please keep taking Omeprazole daily May add Sucaralfate (Carafate) as needed for soothing gas and heartburn indigestion symptoms   For Constipation (less frequent bowel movement that can be hard dry or involve straining).  Recommend trying OTC Miralax 17g = 1 capful in large glass water once daily for now, try several days to see if working, goal is soft stool or BM 1-2 times daily, if too loose then reduce dose or try every other day. If not effective may need to increase it to 2 doses at once in AM or may do 1 in morning and 1 in afternoon/evening  - This medicine is very safe and can be used often without any problem and will not make you dehydrated. It is good for use on AS NEEDED BASIS or even MAINTENANCE therapy for longer term for several days to weeks at a time to help regulate bowel movements  Other more natural remedies or preventative treatment: - Increase hydration with water - Increase fiber in diet (high fiber foods = vegetables, leafy greens, oats/grains) - May take OTC Fiber supplement (metamucil powder or pill/gummy) - May try OTC Probiotic  -------------------------------------------  If you have any significant chest pain that does not go away within 30 minutes, is accompanied by nausea, sweating, shortness of breath, or made worse by activity, this may be evidence of a heart attack, especially if symptoms worsening instead of improving, please call 911 or go directly to the emergency room immediately for evaluation.   Please schedule a Follow-up Appointment to: Return if symptoms worsen or fail to  improve.  If you have any other questions or concerns, please feel free to call the office or send a message through MyChart. You may also schedule an earlier appointment if necessary.  Additionally, you may be receiving a survey about your experience at our office within a few days to 1 week by e-mail or mail. We value your feedback.  Saralyn Pilar, DO [facility], [place]

## 2023-08-20 NOTE — Progress Notes (Addendum)
 Subjective:    Patient ID: Vanessa Reynolds, female    DOB: 08/03/1945, 78 y.o.   MRN: [id number]  Vanessa Reynolds is a 78 y.o. female presenting on 08/20/2023 for Atypical Chest Pain, Palpitations, History of Syncope, LowBP   HPI  Discussed the use of AI scribe software for clinical note transcription with the patient, who gave verbal consent to proceed.  History of Present Illness   Vanessa Reynolds is a 78 year old female with hypertension who presents with low blood pressure and chest pain.   Hypotension / History of Syncope  Here for 6 month visit from 02/2023 last annual, and also follow-up after recent HFU visit 2 weeks ago Last visit 08/01/23, we stopped the Lisinopril 40mg  daily due to lower BP readings and the syncope episode and hospitalization. - Since that time, BP seems to better controlled but still mild low 92/64, but still experiences  occasional lightheadedness - She is monitoring home BP - Still has GERD indigestion and gas, on PPI Omeprazole 40mg  now - Has occasional nausea, improved w/ Zofran  Atypical Chest Last night, acute episode, she experienced a significant episode of severe central chest pain last night, described as pressure radiating to her right shoulder, back, and jaw, lasting about 30 minutes. The pain occurred spontaneously without any apparent trigger such as physical activity or eating. No current symptoms of heartburn or indigestion, but she frequently experiences burping and gas. Substernal chest pain with some radiation to both R and Left shoulder and back - Admits some heart racing palpitation episodes  CHRONIC HYPERTENSION Off medications Reports good compliance, took meds today. Tolerating well, w/o complaints. Denies CP, dyspnea, HA, edema / lightheadedness   History of Colitis / Constipation She has a history of colitis diagnosed during a recent hospital stay, where she experienced diarrhea and abdominal pain. Currently, she reports  constipation, with minimal bowel movements over the past three weeks. She has not started but plans to use MiraLAX to alleviate this issue.   History of Murmur History of Rheumatic Fever       08/20/2023    1:20 PM 03/19/2023    9:18 AM 09/09/2022    1:13 PM  Depression screen PHQ 2/9  Decreased Interest 2 0 1  Down, Depressed, Hopeless 0 0 1  PHQ - 2 Score 2 0 2  Altered sleeping 1 1 2   Tired, decreased energy 3 0 0  Change in appetite 3 0 0  Feeling bad or failure about yourself  2 0 0  Trouble concentrating  0 1  Moving slowly or fidgety/restless 0 0 0  Suicidal thoughts 0 0 0  PHQ-9 Score 11 1 5   Difficult doing work/chores Not difficult at all Not difficult at all        08/20/2023    1:20 PM 03/19/2023    9:19 AM 09/09/2022    1:13 PM 02/28/2022   10:14 AM  GAD 7 : Generalized Anxiety Score  Nervous, Anxious, on Edge 0 0 0 0  Control/stop worrying 0 0 1 0  Worry too much - different things 0 1 1 0  Trouble relaxing 0 0 1 0  Restless 0 1 0 0  Easily annoyed or irritable 0 1 0 0  Afraid - awful might happen 0 1 0 0  Total GAD 7 Score 0 4 3 0  Anxiety Difficulty Not difficult at all   Not difficult at all    Social History   Tobacco Use  Smoking status: Former    Current packs/day: 0.00    Average packs/day: 2.0 packs/day for 30.0 years (60.0 ttl pk-yrs)    Types: Cigarettes    Start date: 05/21/1967    Quit date: 05/20/1997    Years since quitting: 26.2   Smokeless tobacco: Former  Building services engineer status: Never Used  Substance Use Topics   Alcohol use: No   Drug use: No    Review of Systems Per HPI unless specifically indicated above     Objective:    BP 110/68 (BP Location: Left Arm, Patient Position: Sitting, Cuff Size: Normal)   Pulse 80   Ht 5' 3.75" (1.619 m)   Wt 161 lb (73 kg)   BMI 27.85 kg/m   Wt Readings from Last 3 Encounters:  08/20/23 161 lb (73 kg)  08/01/23 166 lb (75.3 kg)  07/27/23 171 lb 15.3 oz (78 kg)    Physical  Exam Vitals and nursing note reviewed.  Constitutional:      General: She is not in acute distress.    Appearance: She is well-developed. She is not diaphoretic.     Comments: Well-appearing, comfortable, cooperative  HENT:     Head: Normocephalic and atraumatic.  Eyes:     General:        Right eye: No discharge.        Left eye: No discharge.     Conjunctiva/sclera: Conjunctivae normal.  Neck:     Thyroid: No thyromegaly.  Cardiovascular:     Rate and Rhythm: Normal rate and regular rhythm.     Heart sounds: Normal heart sounds. No murmur heard.    Comments: No ectopy on auscultation Pulmonary:     Effort: Pulmonary effort is normal. No respiratory distress.     Breath sounds: Normal breath sounds. No wheezing or rales.  Musculoskeletal:        General: Normal range of motion.     Cervical back: Normal range of motion and neck supple.  Lymphadenopathy:     Cervical: No cervical adenopathy.  Skin:    General: Skin is warm and dry.     Findings: No erythema or rash.  Neurological:     Mental Status: She is alert and oriented to person, place, and time.  Psychiatric:        Behavior: Behavior normal.     Comments: Well groomed, good eye contact, normal speech and thoughts     I have personally reviewed the radiology report from 07/26/23 CT Abdomen Pelvis.  CLINICAL DATA:  Abdominal pain and cramping. Nausea and diarrhea. Leukocytosis.   EXAM: CT ABDOMEN AND PELVIS WITH CONTRAST   TECHNIQUE: Multidetector CT imaging of the abdomen and pelvis was performed using the standard protocol following bolus administration of intravenous contrast.   RADIATION DOSE REDUCTION: This exam was performed according to the departmental dose-optimization program which includes automated exposure control, adjustment of the mA and/or kV according to patient size and/or use of iterative reconstruction technique.   CONTRAST:  OMNIPAQUE IOHEXOL 300 MG/ML  SOLN   COMPARISON:   10/11/2019   FINDINGS: Lower Chest: Tiny bilateral pleural effusions and dependent bibasilar atelectasis.   Hepatobiliary: Increased caudate hypertrophy and mild capsular nodularity, consistent with cirrhosis. Small hepatic cysts remains stable. No suspicious hepatic masses identified. Layering gallbladder sludge or tiny gallstones noted. No signs of cholecystitis or biliary ductal dilatation.   Pancreas:  No mass or inflammatory changes.   Spleen: Within normal limits in size and appearance.  Adrenals/Urinary Tract: No suspicious masses identified. No evidence of ureteral calculi or hydronephrosis.   Stomach/Bowel: Moderate wall thickening is seen throughout the length of the descending and sigmoid colon, consistent with colitis. No evidence of perforation or abscess. Mild ascites noted. Rectal surgical anastomosis is again seen. A fluid collection adjacent to the right lateral wall of the rectum is again seen measuring 4.4 x 3.7 cm, without significant change since previous study, and likely postop in etiology.   Vascular/Lymphatic: No pathologically enlarged lymph nodes. No acute vascular findings.   Reproductive:  No mass or other significant abnormality.   Other:  None.   Musculoskeletal:  No suspicious bone lesions identified.   IMPRESSION: Moderate colitis involving the descending and sigmoid colon. No evidence of perforation or abscess.   Mild ascites.   Hepatic cirrhosis. No evidence of hepatic neoplasm.   Layering gallbladder sludge or tiny gallstones. No radiographic evidence of cholecystitis.   Tiny bilateral pleural effusions and dependent bibasilar atelectasis.     Electronically Signed   By: Danae Orleans M.D.   On: 07/26/2023 09:34  Results for orders placed or performed during the hospital encounter of 07/25/23  Comprehensive metabolic panel   Collection Time: 07/25/23 12:58 AM  Result Value Ref Range   Sodium 140 135 - 145 mmol/L    Potassium 3.7 3.5 - 5.1 mmol/L   Chloride 107 98 - 111 mmol/L   CO2 24 22 - 32 mmol/L   Glucose, Bld 132 (H) 70 - 99 mg/dL   BUN 20 8 - 23 mg/dL   Creatinine, Ser 1.61 0.44 - 1.00 mg/dL   Calcium 9.7 8.9 - 09.6 mg/dL   Total Protein 7.4 6.5 - 8.1 g/dL   Albumin 3.4 (L) 3.5 - 5.0 g/dL   AST 40 15 - 41 U/L   ALT 31 0 - 44 U/L   Alkaline Phosphatase 275 (H) 38 - 126 U/L   Total Bilirubin 0.7 0.0 - 1.2 mg/dL   GFR, Estimated 59 (L) >60 mL/min   Anion gap 9 5 - 15  CBC with Differential   Collection Time: 07/25/23 12:58 AM  Result Value Ref Range   WBC 10.5 4.0 - 10.5 K/uL   RBC 4.11 3.87 - 5.11 MIL/uL   Hemoglobin 12.8 12.0 - 15.0 g/dL   HCT 04.5 40.9 - 81.1 %   MCV 93.9 80.0 - 100.0 fL   MCH 31.1 26.0 - 34.0 pg   MCHC 33.2 30.0 - 36.0 g/dL   RDW 91.4 78.2 - 95.6 %   Platelets 158 150 - 400 K/uL   nRBC 0.0 0.0 - 0.2 %   Neutrophils Relative % 78 %   Neutro Abs 8.4 (H) 1.7 - 7.7 K/uL   Lymphocytes Relative 14 %   Lymphs Abs 1.5 0.7 - 4.0 K/uL   Monocytes Relative 4 %   Monocytes Absolute 0.4 0.1 - 1.0 K/uL   Eosinophils Relative 2 %   Eosinophils Absolute 0.2 0.0 - 0.5 K/uL   Basophils Relative 1 %   Basophils Absolute 0.1 0.0 - 0.1 K/uL   Immature Granulocytes 1 %   Abs Immature Granulocytes 0.05 0.00 - 0.07 K/uL  Magnesium   Collection Time: 07/25/23 12:58 AM  Result Value Ref Range   Magnesium 2.1 1.7 - 2.4 mg/dL  Troponin I (High Sensitivity)   Collection Time: 07/25/23 12:58 AM  Result Value Ref Range   Troponin I (High Sensitivity) 14 <18 ng/L  Urinalysis, w/ Reflex to Culture (Infection Suspected) -Urine, Clean Catch  Collection Time: 07/25/23  2:10 AM  Result Value Ref Range   Specimen Source URINE, CLEAN CATCH    Color, Urine YELLOW (A) YELLOW   APPearance CLEAR (A) CLEAR   Specific Gravity, Urine 1.006 1.005 - 1.030   pH 5.0 5.0 - 8.0   Glucose, UA NEGATIVE NEGATIVE mg/dL   Hgb urine dipstick NEGATIVE NEGATIVE   Bilirubin Urine NEGATIVE NEGATIVE    Ketones, ur NEGATIVE NEGATIVE mg/dL   Protein, ur NEGATIVE NEGATIVE mg/dL   Nitrite NEGATIVE NEGATIVE   Leukocytes,Ua NEGATIVE NEGATIVE   RBC / HPF 0 0 - 5 RBC/hpf   WBC, UA 0-5 0 - 5 WBC/hpf   Bacteria, UA RARE (A) NONE SEEN   Squamous Epithelial / HPF 0-5 0 - 5 /HPF   Mucus PRESENT   Troponin I (High Sensitivity)   Collection Time: 07/25/23  2:10 AM  Result Value Ref Range   Troponin I (High Sensitivity) 27 (H) <18 ng/L  Troponin I (High Sensitivity)   Collection Time: 07/25/23  3:06 AM  Result Value Ref Range   Troponin I (High Sensitivity) 36 (H) <18 ng/L  D-dimer, quantitative   Collection Time: 07/25/23  5:29 AM  Result Value Ref Range   D-Dimer, Quant 12.03 (H) 0.00 - 0.50 ug/mL-FEU  Basic metabolic panel   Collection Time: 07/25/23  5:29 AM  Result Value Ref Range   Sodium 139 135 - 145 mmol/L   Potassium 3.5 3.5 - 5.1 mmol/L   Chloride 108 98 - 111 mmol/L   CO2 23 22 - 32 mmol/L   Glucose, Bld 139 (H) 70 - 99 mg/dL   BUN 20 8 - 23 mg/dL   Creatinine, Ser 6.44 0.44 - 1.00 mg/dL   Calcium 8.7 (L) 8.9 - 10.3 mg/dL   GFR, Estimated >03 >47 mL/min   Anion gap 8 5 - 15  CBC   Collection Time: 07/25/23  5:29 AM  Result Value Ref Range   WBC 13.6 (H) 4.0 - 10.5 K/uL   RBC 4.01 3.87 - 5.11 MIL/uL   Hemoglobin 12.4 12.0 - 15.0 g/dL   HCT 42.5 95.6 - 38.7 %   MCV 93.3 80.0 - 100.0 fL   MCH 30.9 26.0 - 34.0 pg   MCHC 33.2 30.0 - 36.0 g/dL   RDW 56.4 33.2 - 95.1 %   Platelets 144 (L) 150 - 400 K/uL   nRBC 0.0 0.0 - 0.2 %  CBC   Collection Time: 07/25/23 11:44 AM  Result Value Ref Range   WBC 15.1 (H) 4.0 - 10.5 K/uL   RBC 4.12 3.87 - 5.11 MIL/uL   Hemoglobin 12.6 12.0 - 15.0 g/dL   HCT 88.4 16.6 - 06.3 %   MCV 92.7 80.0 - 100.0 fL   MCH 30.6 26.0 - 34.0 pg   MCHC 33.0 30.0 - 36.0 g/dL   RDW 01.6 01.0 - 93.2 %   Platelets 147 (L) 150 - 400 K/uL   nRBC 0.0 0.0 - 0.2 %  Troponin I (High Sensitivity)   Collection Time: 07/25/23 11:44 AM  Result Value Ref Range    Troponin I (High Sensitivity) 31 (H) <18 ng/L  ECHOCARDIOGRAM COMPLETE   Collection Time: 07/25/23  3:55 PM  Result Value Ref Range   BP 145/75 mmHg   Ao pk vel 1.30 m/s   AV Area VTI 2.57 cm2   AR max vel 2.31 cm2   AV Mean grad 3.0 mmHg   AV Peak grad 6.8 mmHg   S' Lateral 2.50  cm   AV Area mean vel 2.34 cm2   Area-P 1/2 3.19 cm2   MV VTI 3.25 cm2   Est EF 60 - 65%   Gastrointestinal Panel by PCR , Stool   Collection Time: 07/25/23  6:05 PM   Specimen: Stool  Result Value Ref Range   Campylobacter species NOT DETECTED NOT DETECTED   Plesimonas shigelloides NOT DETECTED NOT DETECTED   Salmonella species NOT DETECTED NOT DETECTED   Yersinia enterocolitica NOT DETECTED NOT DETECTED   Vibrio species NOT DETECTED NOT DETECTED   Vibrio cholerae NOT DETECTED NOT DETECTED   Enteroaggregative E coli (EAEC) NOT DETECTED NOT DETECTED   Enteropathogenic E coli (EPEC) NOT DETECTED NOT DETECTED   Enterotoxigenic E coli (ETEC) NOT DETECTED NOT DETECTED   Shiga like toxin producing E coli (STEC) NOT DETECTED NOT DETECTED   Shigella/Enteroinvasive E coli (EIEC) NOT DETECTED NOT DETECTED   Cryptosporidium NOT DETECTED NOT DETECTED   Cyclospora cayetanensis NOT DETECTED NOT DETECTED   Entamoeba histolytica NOT DETECTED NOT DETECTED   Giardia lamblia NOT DETECTED NOT DETECTED   Adenovirus F40/41 NOT DETECTED NOT DETECTED   Astrovirus NOT DETECTED NOT DETECTED   Norovirus GI/GII NOT DETECTED NOT DETECTED   Rotavirus A NOT DETECTED NOT DETECTED   Sapovirus (I, II, IV, and V) NOT DETECTED NOT DETECTED  C Difficile Quick Screen w PCR reflex   Collection Time: 07/25/23  6:05 PM   Specimen: STOOL  Result Value Ref Range   C Diff antigen NEGATIVE NEGATIVE   C Diff toxin NEGATIVE NEGATIVE   C Diff interpretation No C. difficile detected.   CBC with Differential/Platelet   Collection Time: 07/26/23  4:34 AM  Result Value Ref Range   WBC 16.0 (H) 4.0 - 10.5 K/uL   RBC 3.80 (L) 3.87 - 5.11  MIL/uL   Hemoglobin 11.8 (L) 12.0 - 15.0 g/dL   HCT 96.0 (L) 45.4 - 09.8 %   MCV 93.4 80.0 - 100.0 fL   MCH 31.1 26.0 - 34.0 pg   MCHC 33.2 30.0 - 36.0 g/dL   RDW 11.9 14.7 - 82.9 %   Platelets 147 (L) 150 - 400 K/uL   nRBC 0.0 0.0 - 0.2 %   Neutrophils Relative % 83 %   Neutro Abs 13.3 (H) 1.7 - 7.7 K/uL   Lymphocytes Relative 10 %   Lymphs Abs 1.6 0.7 - 4.0 K/uL   Monocytes Relative 6 %   Monocytes Absolute 0.9 0.1 - 1.0 K/uL   Eosinophils Relative 0 %   Eosinophils Absolute 0.0 0.0 - 0.5 K/uL   Basophils Relative 0 %   Basophils Absolute 0.0 0.0 - 0.1 K/uL   Immature Granulocytes 1 %   Abs Immature Granulocytes 0.10 (H) 0.00 - 0.07 K/uL  Comprehensive metabolic panel   Collection Time: 07/26/23  4:34 AM  Result Value Ref Range   Sodium 139 135 - 145 mmol/L   Potassium 3.4 (L) 3.5 - 5.1 mmol/L   Chloride 108 98 - 111 mmol/L   CO2 23 22 - 32 mmol/L   Glucose, Bld 110 (H) 70 - 99 mg/dL   BUN 19 8 - 23 mg/dL   Creatinine, Ser 5.62 0.44 - 1.00 mg/dL   Calcium 8.6 (L) 8.9 - 10.3 mg/dL   Total Protein 6.6 6.5 - 8.1 g/dL   Albumin 2.9 (L) 3.5 - 5.0 g/dL   AST 27 15 - 41 U/L   ALT 22 0 - 44 U/L   Alkaline Phosphatase 191 (H)  38 - 126 U/L   Total Bilirubin 1.1 0.0 - 1.2 mg/dL   GFR, Estimated >11 >91 mL/min   Anion gap 8 5 - 15  CBG monitoring, ED   Collection Time: 07/26/23  4:51 AM  Result Value Ref Range   Glucose-Capillary 107 (H) 70 - 99 mg/dL  Glucose, capillary   Collection Time: 07/27/23  4:34 AM  Result Value Ref Range   Glucose-Capillary 86 70 - 99 mg/dL  Basic metabolic panel   Collection Time: 07/27/23  6:48 AM  Result Value Ref Range   Sodium 134 (L) 135 - 145 mmol/L   Potassium 3.3 (L) 3.5 - 5.1 mmol/L   Chloride 108 98 - 111 mmol/L   CO2 22 22 - 32 mmol/L   Glucose, Bld 94 70 - 99 mg/dL   BUN 14 8 - 23 mg/dL   Creatinine, Ser 4.78 0.44 - 1.00 mg/dL   Calcium 8.3 (L) 8.9 - 10.3 mg/dL   GFR, Estimated >29 >56 mL/min   Anion gap 4 (L) 5 - 15  CBC    Collection Time: 07/27/23  6:48 AM  Result Value Ref Range   WBC 12.9 (H) 4.0 - 10.5 K/uL   RBC 3.75 (L) 3.87 - 5.11 MIL/uL   Hemoglobin 11.6 (L) 12.0 - 15.0 g/dL   HCT 21.3 (L) 08.6 - 57.8 %   MCV 89.6 80.0 - 100.0 fL   MCH 30.9 26.0 - 34.0 pg   MCHC 34.5 30.0 - 36.0 g/dL   RDW 46.9 62.9 - 52.8 %   Platelets 153 150 - 400 K/uL   nRBC 0.0 0.0 - 0.2 %   EKG - performed in office today  Date: 08/20/23  Rate: 90  Rhythm: sinus arrhythmia and premature atrial contractions (PAC)  QRS Axis: normal  Intervals: PR shortened  ST/T Wave abnormalities: non-specific T wave changes  Conduction Disutrbances:none  Additional Narrative Interpretation: none  Old EKG Reviewed: unchanged 07/25/23       Assessment & Plan:   Problem List Items Addressed This Visit     Essential hypertension - Primary   Relevant Orders   EKG 12-Lead   Gastroesophageal reflux disease without esophagitis   Relevant Medications   sucralfate (CARAFATE) 1 g tablet   Other Visit Diagnoses       Nausea         Atypical chest pain       Relevant Orders   EKG 12-Lead     History of syncope       Relevant Orders   EKG 12-Lead   LONG TERM MONITOR (3-14 DAYS)     Intermittent palpitations       Relevant Orders   LONG TERM MONITOR (3-14 DAYS)      Atypical Chest Pain Episode - (Resolved) Last night, Recent central chest pain radiating to the right shoulder (she also says some radiating to L side as well) and jaw, lasting 30 minutes. Differential includes cardiac versus gastrointestinal causes with inc gas bloating and indigestion being treated currently for GERD  Concern with history of recent syncope hospitalization Already has HFU Cardiology appointment scheduled for May 27th, but will attempt to expedite.  Advised if recurrent symptoms such as chest pain dyspnea she should seek care at ED - Contact cardiologist to expedite appointment. - EKG today see below  Sinus Arrhythmia vs Premature Atrial Complexes  (PACs) Episodes of palpitations with EKG showing PACs, no definitive atrial fibrillation but cannot 100% rule it out Further monitoring required to assess  for arrhythmias. Discussed possibility of using a longer-term cardiac monitor ZIO patch, will ask Cardiology or consider ordering now - Communicate findings to cardiologist for further evaluation. - Consider long-term cardiac monitoring if recommended by cardiologist ZIO patch  Hypotension Since syncope has been discontinued off of BP meds. Lisinopril 40mg  now discontinued for 2+ weeks Intermittent low blood pressure readings since discontinuation of lisinopril due to syncope and low BP. Current readings occasionally as low as 92/64 mmHg, with associated lightheadedness. No antihypertensive medications currently prescribed.  - Increase dietary sodium intake. - Ensure adequate hydration. - Monitor blood pressure regularly.  Gastroesophageal Reflux Disease (GERD) Symptoms of gas, bloating, and occasional nausea. Currently taking omeprazole. Symptoms may contribute to chest pain.  - Continue omeprazole daily 40mg  - Prescribe sucralfate for symptomatic relief of heartburn and indigestion.  Constipation Constipation with minimal bowel movements over the past three weeks. Previously advised against laxatives due to colitis, but now recommended due to current symptoms. Explained that constipation could contribute to upper abdominal pain and gas. - Use MiraLAX, starting with one capful daily and increasing as needed. - Consider prune juice as an adjunct to MiraLAX.       Message route chart to Dr Azucena Cecil consider if patient can be seen sooner than 10/14/23 or if we should order ZIO patch or other test now.  08/25/23 Update Dr Azucena Cecil replied and requested ZIO Patch monitor 14 day next, and he would follow-up as scheduled.  Orders Placed This Encounter  Procedures   LONG TERM MONITOR (3-14 DAYS)    Standing Status:   Future    Number of  Occurrences:   1    Expiration Date:   08/24/2024    Where should this test be performed?:   [facility]    Does the patient have an implanted cardiac device?:   No    Prescribed days of wear:   14    Type of enrollment:   Home Enrollment    Vendor::   [employer]   EKG 12-Lead    Meds ordered this encounter  Medications   sucralfate (CARAFATE) 1 g tablet    Sig: Take 1 tablet (1 g total) by mouth 4 (four) times daily -  with meals and at bedtime. As needed for indigestion, abdominal pain    Dispense:  60 tablet    Refill:  0    Follow up plan: Return if symptoms worsen or fail to improve.   Saralyn Pilar, DO [facility] 08/20/2023, 1:23 PM

## 2023-08-22 NOTE — Telephone Encounter (Signed)
 Requested Prescriptions  Pending Prescriptions Disp Refills   lovastatin (MEVACOR) 20 MG tablet [Pharmacy Med Name: LOVASTATIN 20 MG TAB] 90 tablet 0    Sig: TAKE 1 TABLET BY MOUTH AT BEDTIME     Cardiovascular:  Antilipid - Statins 2 Failed - 08/22/2023  8:36 AM      Failed - Valid encounter within last 12 months    Recent Outpatient Visits           2 days ago Essential hypertension   [place] [facility] [place], Netta Neat, DO   3 weeks ago Syncope, unspecified syncope type   [place] [facility] Smitty Cords, DO       Future Appointments             In 1 month Agbor-Etang, Arlys John, MD [facility] [place]   In 7 months Althea Charon, Netta Neat, DO [facility]            Failed - Lipid Panel in normal range within the last 12 months    Cholesterol, Total  Date Value Ref Range Status  02/20/2015 198 100 - 199 mg/dL Final   Cholesterol  Date Value Ref Range Status  03/19/2023 225 (H) <200 mg/dL Final   LDL Cholesterol (Calc)  Date Value Ref Range Status  03/19/2023 130 (H) mg/dL (calc) Final    Comment:    Reference range: <100 . Desirable range <100 mg/dL for primary prevention;   <70 mg/dL for patients with CHD or diabetic patients  with > or = 2 CHD risk factors. Marland Kitchen LDL-C is now calculated using the Martin-Hopkins  calculation, which is a validated novel method providing  better accuracy than the Friedewald equation in the  estimation of LDL-C.  Horald Pollen et al. Lenox Ahr. 1610;960(45): 2061-2068  (http://education.[employer].com/faq/FAQ164)    HDL  Date Value Ref Range Status  03/19/2023 80 > OR = 50 mg/dL Final  40/98/1191 62 >47 mg/dL Final    Comment:    According to ATP-III Guidelines, HDL-C >59 mg/dL is considered a negative risk factor for CHD.    Triglycerides  Date Value Ref Range Status  03/19/2023 49 <150 mg/dL Final         Passed -  Cr in normal range and within 360 days    Creat  Date Value Ref Range Status  03/19/2023 0.83 0.60 - 1.00 mg/dL Final   Creatinine, Ser  Date Value Ref Range Status  07/27/2023 0.71 0.44 - 1.00 mg/dL Final         Passed - Patient is not pregnant       lisinopril (ZESTRIL) 40 MG tablet [Pharmacy Med Name: LISINOPRIL 40 MG TAB] 90 tablet 3    Sig: TAKE 1 TABLET BY MOUTH AT BEDTIME     Cardiovascular:  ACE Inhibitors Failed - 08/22/2023  8:36 AM      Failed - K in normal range and within 180 days    Potassium  Date Value Ref Range Status  07/27/2023 3.3 (L) 3.5 - 5.1 mmol/L Final         Failed - Valid encounter within last 6 months    Recent Outpatient Visits           2 days ago Essential hypertension   [facility] Smitty Cords, DO   3 weeks ago Syncope, unspecified syncope type   [facility] [place], Netta Neat,  DO       Future Appointments             In 1 month Agbor-Etang, Arlys John, MD [facility] [place]   In 7 months Althea Charon, Netta Neat, DO [facility] [facility]            Passed - Cr in normal range and within 180 days    Creat  Date Value Ref Range Status  03/19/2023 0.83 0.60 - 1.00 mg/dL Final   Creatinine, Ser  Date Value Ref Range Status  07/27/2023 0.71 0.44 - 1.00 mg/dL Final         Passed - Patient is not pregnant      Passed - Last BP in normal range    BP Readings from Last 1 Encounters:  08/20/23 110/68

## 2023-08-25 ENCOUNTER — Ambulatory Visit: Attending: Family Medicine

## 2023-08-25 ENCOUNTER — Telehealth: Payer: Self-pay | Admitting: Family Medicine

## 2023-08-25 DIAGNOSIS — R002 Palpitations: Secondary | ICD-10-CM

## 2023-08-25 DIAGNOSIS — Z87898 Personal history of other specified conditions: Secondary | ICD-10-CM

## 2023-08-25 NOTE — Addendum Note (Signed)
 Addended by: Smitty Cords on: 08/25/2023 09:44 AM   Modules accepted: Orders

## 2023-08-25 NOTE — Telephone Encounter (Signed)
 Left message for patient to return call OK to advise

## 2023-08-25 NOTE — Telephone Encounter (Signed)
 Please call patient to notify her that her upcoming Cardiologist recommended that we order the ZIO Patch Heart Monitor. I have placed the order. It should be sent directly to patient with instructions and she is to use this monitor for 14 days approximately and send back to the company. Her heart doctor will review the results and follow-up with her and he said to keep apt as scheduled right now.

## 2023-09-18 ENCOUNTER — Other Ambulatory Visit: Payer: Self-pay | Admitting: Family Medicine

## 2023-09-18 DIAGNOSIS — I10 Essential (primary) hypertension: Secondary | ICD-10-CM

## 2023-09-22 NOTE — Telephone Encounter (Signed)
 Refused lisinopril  40 mg because it was discontinued 08/01/2023.   LOV 08/20/2023.

## 2023-10-14 ENCOUNTER — Encounter: Payer: Self-pay | Admitting: Cardiology

## 2023-10-14 ENCOUNTER — Ambulatory Visit: Attending: Cardiology | Admitting: Cardiology

## 2023-10-14 VITALS — BP 147/78 | HR 70 | Ht 64.0 in | Wt 164.5 lb

## 2023-10-14 DIAGNOSIS — R55 Syncope and collapse: Secondary | ICD-10-CM | POA: Diagnosis not present

## 2023-10-14 DIAGNOSIS — I10 Essential (primary) hypertension: Secondary | ICD-10-CM | POA: Diagnosis not present

## 2023-10-14 DIAGNOSIS — E7800 Pure hypercholesterolemia, unspecified: Secondary | ICD-10-CM | POA: Diagnosis not present

## 2023-10-14 NOTE — Patient Instructions (Signed)
 Medication Instructions:  Your Physician recommend you continue on your current medication as directed.    *If you need a refill on your cardiac medications before your next appointment, please call your pharmacy*  Lab Work: No labs ordered today  If you have labs (blood work) drawn today and your tests are completely normal, you will receive your results only by: MyChart Message (if you have MyChart) OR A paper copy in the mail If you have any lab test that is abnormal or we need to change your treatment, we will call you to review the results.  Testing/Procedures: No test ordered today   Follow-Up: At [facility], you and your health needs are our priority.  As part of our continuing mission to provide you with exceptional heart care, our providers are all part of one team.  This team includes your primary Cardiologist (physician) and Advanced Practice Providers or APPs (Physician Assistants and Nurse Practitioners) who all work together to provide you with the care you need, when you need it.  Your next appointment:   6 week(s)  Provider:   You may see Constancia Delton, MD or one of the following Advanced Practice Providers on your designated Care Team:   Laneta Pintos, NP Gildardo Labrador, PA-C Varney Gentleman, PA-C Cadence Rufus, PA-C Ronald Cockayne, NP Morey Ar, NP    We recommend signing up for the patient portal called "MyChart".  Sign up information is provided on this After Visit Summary.  MyChart is used to connect with patients for Virtual Visits (Telemedicine).  Patients are able to view lab/test results, encounter notes, upcoming appointments, etc.  Non-urgent messages can be sent to your provider as well.   To learn more about what you can do with MyChart, go to [email or web address].
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
## 2023-10-27 DIAGNOSIS — Z87898 Personal history of other specified conditions: Secondary | ICD-10-CM | POA: Diagnosis not present

## 2023-10-27 DIAGNOSIS — R002 Palpitations: Secondary | ICD-10-CM | POA: Diagnosis not present

## 2023-10-29 ENCOUNTER — Ambulatory Visit: Payer: Self-pay | Admitting: Family Medicine

## 2023-10-29 DIAGNOSIS — Z87898 Personal history of other specified conditions: Secondary | ICD-10-CM

## 2023-10-29 DIAGNOSIS — R002 Palpitations: Secondary | ICD-10-CM | POA: Diagnosis not present

## 2023-11-08 ENCOUNTER — Other Ambulatory Visit: Payer: Self-pay | Admitting: Family Medicine

## 2023-11-08 DIAGNOSIS — E7849 Other hyperlipidemia: Secondary | ICD-10-CM

## 2023-11-08 DIAGNOSIS — K219 Gastro-esophageal reflux disease without esophagitis: Secondary | ICD-10-CM

## 2023-11-11 NOTE — Telephone Encounter (Signed)
 Labs in date.   LOV 08/20/2023.   Omeprazole  being requested too soon it I s refused.     Requested Prescriptions  Pending Prescriptions Disp Refills   lovastatin  (MEVACOR ) 20 MG tablet [Pharmacy Med Name: LOVASTATIN  20 MG TAB] 90 tablet 0    Sig: TAKE 1 TABLET BY MOUTH AT BEDTIME     Cardiovascular:  Antilipid - Statins 2 Failed - 11/11/2023 12:46 PM      Failed - Valid encounter within last 12 months    Recent Outpatient Visits           2 months ago Essential hypertension   [place] [facility] Dotsero, Marsa PARAS, DO   3 months ago Syncope, unspecified syncope type   [place] [facility] Edman, Marsa PARAS, DO       Future Appointments             In 2 weeks Agbor-Etang, Redell, MD [facility] [place]   In 4 months Edman, Marsa PARAS, DO [place] [facility]            Failed - Lipid Panel in normal range within the last 12 months    Cholesterol, Total  Date Value Ref Range Status  02/20/2015 198 100 - 199 mg/dL Final   Cholesterol  Date Value Ref Range Status  03/19/2023 225 (H) <200 mg/dL Final   LDL Cholesterol (Calc)  Date Value Ref Range Status  03/19/2023 130 (H) mg/dL (calc) Final    Comment:    Reference range: <100 . Desirable range <100 mg/dL for primary prevention;   <70 mg/dL for patients with CHD or diabetic patients  with > or = 2 CHD risk factors. SABRA LDL-C is now calculated using the Martin-Hopkins  calculation, which is a validated novel method providing  better accuracy than the Friedewald equation in the  estimation of LDL-C.  Gladis APPLETHWAITE et al. SANDREA. 7986;689(80): 2061-2068  (http://education.Quest[employer].com/faq/FAQ164)    HDL  Date Value Ref Range Status  03/19/2023 80 > OR = 50 mg/dL Final  89/96/7983 62 >60 mg/dL Final    Comment:    According to ATP-III Guidelines, HDL-C >59 mg/dL is considered a negative risk factor for CHD.     Triglycerides  Date Value Ref Range Status  03/19/2023 49 <150 mg/dL Final         Passed - Cr in normal range and within 360 days    Creat  Date Value Ref Range Status  03/19/2023 0.83 0.60 - 1.00 mg/dL Final   Creatinine, Ser  Date Value Ref Range Status  07/27/2023 0.71 0.44 - 1.00 mg/dL Final         Passed - Patient is not pregnant       omeprazole  (PRILOSEC) 40 MG capsule [Pharmacy Med Name: OMEPRAZOLE  DR 40 MG CAP] 90 capsule 1    Sig: TAKE 1 CAPSULE BY MOUTH ONCE DAILY BEFORE BREAKFAST     Gastroenterology: Proton Pump Inhibitors Failed - 11/11/2023 12:46 PM      Failed - Valid encounter within last 12 months    Recent Outpatient Visits           2 months ago Essential hypertension   [place] [facility] Ste[place], Marsa PARAS, DO   3 months ago Syncope, unspecified syncope type   [facility] Edman Marsa PARAS, DO       Future Appointments  In 2 weeks Agbor-Etang, Redell, MD [facility] [place]   In 4 months Edman, Marsa PARAS, DO [facility]

## 2023-11-11 NOTE — Telephone Encounter (Signed)
 Omeprazole  ended up being sent for refill.   08/01/2023 #90, 1 refill.   Had one refill remaining.

## 2023-11-27 ENCOUNTER — Encounter: Payer: Self-pay | Admitting: Cardiology

## 2023-11-27 ENCOUNTER — Ambulatory Visit: Attending: Cardiology | Admitting: Cardiology

## 2023-11-27 VITALS — BP 140/72 | HR 76 | Ht 62.0 in | Wt 162.0 lb

## 2023-11-27 DIAGNOSIS — R55 Syncope and collapse: Secondary | ICD-10-CM

## 2023-11-27 DIAGNOSIS — E7800 Pure hypercholesterolemia, unspecified: Secondary | ICD-10-CM | POA: Diagnosis not present

## 2023-11-27 DIAGNOSIS — I10 Essential (primary) hypertension: Secondary | ICD-10-CM

## 2023-11-27 NOTE — Patient Instructions (Signed)
 Medication Instructions:  Your physician recommends that you continue on your current medications as directed. Please refer to the Current Medication list given to you today.    *If you need a refill on your cardiac medications before your next appointment, please call your pharmacy*  Lab Work: No labs ordered today    Testing/Procedures: No test ordered today   Follow-Up: At [facility], you and your health needs are our priority.  As part of our continuing mission to provide you with exceptional heart care, our providers are all part of one team.  This team includes your primary Cardiologist (physician) and Advanced Practice Providers or APPs (Physician Assistants and Nurse Practitioners) who all work together to provide you with the care you need, when you need it.  Your next appointment:   6 month(s)  Provider:   Redell Cave, MD

## 2023-11-27 NOTE — Progress Notes (Signed)
 Cardiology Office Note:    Date:  11/27/2023   ID:  Vanessa Reynolds, DOB 1945-12-02, MRN [id number]  PCP:  Edman Marsa PARAS, DO   [facility] Providers Cardiologist:  Redell Cave, MD     Referring MD: Edman Marsa *   Chief Complaint  Patient presents with   Follow-up    2 month follow up pt has been doing well with no complaints of chest pain, chest pressure or SOB, medciation reviewed verbally with patient    History of Present Illness:    Vanessa Reynolds is a 78 y.o. female with a hx of hypertension, hyperlipidemia presenting for follow-up .  Last seen due to a syncopal episode in the context of abdominal cramping and straining.  Also endorsed gastric reflux.  Omeprazole  was increased to 40 mg daily with good effect.  Has rare palpitations.  Cardiac monitor was placed to evaluate any significant arrhythmias.  Denies any further episodes of syncope.  Blood pressures are adequately controlled at home with systolics 106-130s.  Prior notes/testing Echocardiogram obtained 07/25/2023 EF 60 to 65%, aortic valve sclerosis.  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   Rectal cancer [facility])     Past Surgical History:  Procedure Laterality Date   APPENDECTOMY  December 2011   BILATERAL OOPHORECTOMY  December 2011   Completed at time of low anterior resection.   COLON SURGERY  03/14/10   lap assisted low anterior resection-adenocarcinoma of the rectum and a villous adenoma   COLONOSCOPY  2011,2012,2015   Dr. Viktoria, Dr. Margarete)   COLONOSCOPY  WITH PROPOFOL  N/A 01/05/2020   Procedure: COLONOSCOPY WITH PROPOFOL ;  Surgeon: Dessa Reyes ORN, MD;  Location: [facility] END[facility]COPY;  Service: Endoscopy;  Laterality: N/A;    Current Medications: Current Meds  Medication Sig   fluticasone  (FLONASE ) 50 MCG/ACT nasal spray PLACE 2 SPRAYS INTO BOTH NOSTRILS DAILY.USE FOR 4-6 WEEKS THEN STOP AND USE SEASONALLY OR AS NEEDED   loratadine  (CLARITIN ) 10 MG tablet Take 1 tablet (10 mg total) by mouth daily. Use for 4-6 weeks then stop, and use as needed or seasonally   lovastatin  (MEVACOR ) 20 MG tablet TAKE 1 TABLET BY MOUTH AT BEDTIME   omeprazole  (PRILOSEC) 40 MG capsule TAKE 1 CAPSULE BY MOUTH ONCE DAILY BEFORE BREAKFAST   ondansetron  (ZOFRAN -ODT) 8 MG disintegrating tablet Take 1 tablet (8 mg total) by mouth every 8 (eight) hours as needed for nausea or vomiting.   sucralfate  (CARAFATE ) 1 g tablet Take 1 tablet (1 g total) by mouth 4 (four) times daily -  with meals and at bedtime. As needed for indigestion, abdominal pain   vitamin B-12 (CYANOCOBALAMIN ) 100 MCG tablet Take 100 mcg by mouth daily.     Allergies:   Fish oil   Social History   Socioeconomic History   Marital status: Married    Spouse name: Patches Mcdonnell   Number of children: 3   Years of education: Not on file   Highest education level: 10th grade  Occupational History  Comment: fulltime    Occupation: Retired  Tobacco Use   Smoking status: Former    Current packs/day: 0.00    Average packs/day: 2.0 packs/day for 30.0 years (60.0 ttl pk-yrs)    Types: Cigarettes    Start date: 05/21/1967    Quit date: 05/20/1997    Years since quitting: 26.5   Smokeless tobacco: Former  Building services engineer status: Never Used  Substance and Sexual Activity   Alcohol use: No   Drug use: No   Sexual activity: Not on file  Other Topics Concern   Not on file  Social History Narrative   Not on file   Social Drivers of Health   Financial Resource Strain: Low Risk  (08/27/2021)    Overall Financial Resource Strain (CARDIA)    Difficulty of Paying Living Expenses: Not hard at all  Food Insecurity: No Food Insecurity (07/27/2023)   Hunger Vital Sign    Worried About Running Out of Food in the Last Year: Never true    Ran Out of Food in the Last Year: Never true  Transportation Needs: No Transportation Needs (07/27/2023)   PRAPARE - Administrator, Civil Service (Medical): No    Lack of Transportation (Non-Medical): No  Physical Activity: Inactive (08/27/2021)   Exercise Vital Sign    Days of Exercise per Week: 0 days    Minutes of Exercise per Session: 0 min  Stress: No Stress Concern Present (08/27/2021)   [employer] of Occupational Health - Occupational Stress Questionnaire    Feeling of Stress : Not at all  Social Connections: Socially Integrated (07/27/2023)   Social Connection and Isolation Panel    Frequency of Communication with Friends and Family: Three times a week    Frequency of Social Gatherings with Friends and Family: Three times a week    Attends Religious Services: 1 to 4 times per year    Active Member of Clubs or Organizations: Yes    Attends Engineer, structural: More than 4 times per year    Marital Status: Married     Family History: The patient's family history includes Cancer in her father; Hypertension in her mother. There is no history of Breast cancer.  ROS:   Please see the history of present illness.     All other systems reviewed and are negative.  EKGs/Labs/Other Studies Reviewed:    The following studies were reviewed today:  EKG Interpretation Date/Time:  Thursday November 27 2023 11:45:53 EDT Ventricular Rate:  76 PR Interval:  188 QRS Duration:  88 QT Interval:  390 QTC Calculation: 438 R Axis:   10  Text Interpretation: Sinus rhythm with marked sinus arrhythmia Confirmed by Darliss Rogue ([id number]) on 11/27/2023 12:00:50 PM    Recent Labs: 03/19/2023: TSH 0.95 07/25/2023: Magnesium  2.1 07/26/2023:  ALT 22 07/27/2023: BUN 14; Creatinine, Ser 0.71; Hemoglobin 11.6; Platelets 153; Potassium 3.3; Sodium 134  Recent Lipid Panel    Component Value Date/Time   CHOL 225 (H) 03/19/2023 0948   CHOL 198 02/20/2015 1138   TRIG 49 03/19/2023 0948   HDL 80 03/19/2023 0948   HDL 62 02/20/2015 1138   CHOLHDL 2.8 03/19/2023 0948   VLDL 16 06/17/2016 0001   LDLCALC 130 (H) 03/19/2023 0948     Risk Assessment/Calculations:          Physical Exam:    VS:  BP (!) 140/72 (BP Location: Left Arm, Patient Position: Sitting, Cuff Size: Normal)   Pulse 76  Ht 5' 2 (1.575 m)   Wt 162 lb (73.5 kg)   SpO2 98%   BMI 29.63 kg/m     Wt Readings from Last 3 Encounters:  11/27/23 162 lb (73.5 kg)  10/14/23 164 lb 8 oz (74.6 kg)  08/20/23 161 lb (73 kg)     GEN:  Well nourished, well developed in no acute distress HEENT: Normal NECK: No JVD; No carotid bruits CARDIAC: RRR, 1/6 systolic murmur. RESPIRATORY:  Clear to auscultation without rales, wheezing or rhonchi  ABDOMEN: Soft, non-tender, non-distended MUSCULOSKELETAL:  No edema; No deformity  SKIN: Warm and dry NEUROLOGIC:  Alert and oriented x 3 PSYCHIATRIC:  Normal affect   ASSESSMENT:    1. Syncope and collapse   2. Primary hypertension   3. Pure hypercholesterolemia    PLAN:    In order of problems listed above:  Syncope, last episode occurring in the context of abdominal discomfort, constipation, indicating possible vasovagal response.  She states feeling better since increasing dose of omeprazole .  Cardiac monitor 10/2023 frequent PACs, nonsustained SVT, no A-fib or flutter.  Has rare palpitations.  Echo with normal EF.   Hypertension, BP elevated today, better controlled at home.  Consider beta-blocker if BP becomes elevated due to history of nonsustained SVT and PACs. Hyperlipidemia, continue lovastatin  20 mg daily.  Follow-up in 6 months     Medication Adjustments/Labs and Tests Ordered: Current medicines are reviewed  at length with the patient today.  Concerns regarding medicines are outlined above.  Orders Placed This Encounter  Procedures   EKG 12-Lead   No orders of the defined types were placed in this encounter.   Patient Instructions  Medication Instructions:  Your physician recommends that you continue on your current medications as directed. Please refer to the Current Medication list given to you today.    *If you need a refill on your cardiac medications before your next appointment, please call your pharmacy*  Lab Work: No labs ordered today    Testing/Procedures: No test ordered today   Follow-Up: At [facility], you and your health needs are our priority.  As part of our continuing mission to provide you with exceptional heart care, our providers are all part of one team.  This team includes your primary Cardiologist (physician) and Advanced Practice Providers or APPs (Physician Assistants and Nurse Practitioners) who all work together to provide you with the care you need, when you need it.  Your next appointment:   6 month(s)  Provider:   Redell Cave, MD       Signed, Redell Cave, MD  11/27/2023 12:29 PM    [facility]

## 2023-12-11 ENCOUNTER — Ambulatory Visit (INDEPENDENT_AMBULATORY_CARE_PROVIDER_SITE_OTHER)

## 2023-12-11 ENCOUNTER — Other Ambulatory Visit: Payer: Self-pay | Admitting: Family Medicine

## 2023-12-11 VITALS — BP 140/88 | HR 74 | Ht 62.0 in | Wt 162.0 lb

## 2023-12-11 DIAGNOSIS — E7849 Other hyperlipidemia: Secondary | ICD-10-CM

## 2023-12-11 DIAGNOSIS — R300 Dysuria: Secondary | ICD-10-CM | POA: Insufficient documentation

## 2023-12-11 LAB — POCT URINALYSIS DIPSTICK
Bilirubin, UA: NEGATIVE
Glucose, UA: NEGATIVE
Ketones, UA: NEGATIVE
Nitrite, UA: NEGATIVE
Odor: POSITIVE
Protein, UA: NEGATIVE
Spec Grav, UA: 1.01 (ref 1.010–1.025)
Urobilinogen, UA: 0.2 U/dL
pH, UA: 5 (ref 5.0–8.0)

## 2023-12-11 MED ORDER — CEPHALEXIN 250 MG PO CAPS: 250.0000 mg | ORAL_CAPSULE | Freq: Four times a day (QID) | ORAL | 0 refills | Status: AC

## 2023-12-11 NOTE — Progress Notes (Signed)
 SUBJECTIVE: Vanessa Reynolds is a 78 y.o. female who complains of urinary frequency, urgency and dysuria x 7 days, without flank pain, fever, chills, or abnormal vaginal discharge or bleeding.   OBJECTIVE: Appears well, in no apparent distress.  Vital signs are normal. The abdomen is soft without tenderness, guarding, mass, rebound or organomegaly. No CVA tenderness. Urine dipstick shows positive dip.  Micro exam: pending.   ASSESSMENT: Acute cystitis uncomplicated without evidence of pyelonephritis  PLAN: Take antibiotic as directed.  Drink plenty of fluids,  may use Pyridium OTC prn. Call or return o clinic prn if fever, back pain, nausea, vomiting or symptoms do not resolve.   Problem List Items Addressed This Visit       Other   Dysuria - Primary   Relevant Orders   POCT Urinalysis Dipstick (Completed)   Urine Culture   Patient urinalysis dip unequivocal.  It is possible the patient is having urethritis but we will wait for culture.  Given her symptoms and some lower pelvic discomfort, better to treat for UTI.  Patient should take cephalexin  4 times daily x 5 days.  Follow-up immediately if symptoms not resolving.  Urinalysis for culture and we will follow-up result.

## 2023-12-12 LAB — URINE CULTURE
MICRO NUMBER:: 16742707
SPECIMEN QUALITY:: ADEQUATE

## 2023-12-12 NOTE — Telephone Encounter (Signed)
 Requested Prescriptions  Refused Prescriptions Disp Refills   lovastatin  (MEVACOR ) 20 MG tablet [Pharmacy Med Name: LOVASTATIN  20 MG TAB] 90 tablet 0    Sig: TAKE 1 TABLET BY MOUTH AT BEDTIME     Cardiovascular:  Antilipid - Statins 2 Failed - 12/12/2023  4:36 PM      Failed - Lipid Panel in normal range within the last 12 months    Cholesterol, Total  Date Value Ref Range Status  02/20/2015 198 100 - 199 mg/dL Final   Cholesterol  Date Value Ref Range Status  03/19/2023 225 (H) <200 mg/dL Final   LDL Cholesterol (Calc)  Date Value Ref Range Status  03/19/2023 130 (H) mg/dL (calc) Final    Comment:    Reference range: <100 . Desirable range <100 mg/dL for primary prevention;   <70 mg/dL for patients with CHD or diabetic patients  with > or = 2 CHD risk factors. SABRA LDL-C is now calculated using the Martin-Hopkins  calculation, which is a validated novel method providing  better accuracy than the Friedewald equation in the  estimation of LDL-C.  Gladis APPLETHWAITE et al. SANDREA. 7986;689(80): 2061-2068  (http://education.[employer].com/faq/FAQ164)    HDL  Date Value Ref Range Status  03/19/2023 80 > OR = 50 mg/dL Final  89/96/7983 62 >60 mg/dL Final    Comment:    According to ATP-III Guidelines, HDL-C >59 mg/dL is considered a negative risk factor for CHD.    Triglycerides  Date Value Ref Range Status  03/19/2023 49 <150 mg/dL Final         Passed - Cr in normal range and within 360 days    Creat  Date Value Ref Range Status  03/19/2023 0.83 0.60 - 1.00 mg/dL Final   Creatinine, Ser  Date Value Ref Range Status  07/27/2023 0.71 0.44 - 1.00 mg/dL Final         Passed - Patient is not pregnant      Passed - Valid encounter within last 12 months    Recent Outpatient Visits           Yesterday Dysuria   [place] [facility] Everlene Parris LABOR, MD   3 months ago Essential hypertension   [place] [facility] [place],  Marsa PARAS, DO   4 months ago Syncope, unspecified syncope type   [facility] Edman, Marsa PARAS, DO       Future Appointments             In 3 months Edman, Marsa PARAS, DO [place] [facility], [facility]

## 2023-12-24 ENCOUNTER — Other Ambulatory Visit: Payer: Self-pay | Admitting: Family Medicine

## 2023-12-24 DIAGNOSIS — R110 Nausea: Secondary | ICD-10-CM

## 2023-12-26 NOTE — Telephone Encounter (Signed)
 Requested medications are due for refill today.  yes  Requested medications are on the active medications list.  yes  Last refill. 08/01/2023 #20 2 rf  Future visit scheduled.   yes  Notes to clinic.  Refill not delegated.    Requested Prescriptions  Pending Prescriptions Disp Refills   ondansetron  (ZOFRAN -ODT) 8 MG disintegrating tablet [Pharmacy Med Name: ONDANSETRON  8 MG ODT] 30 tablet     Sig: 1 TABLET UNDER THE TONGUE EVERY 8 HOURS AS NEEDED NAUSEA AND VOMITING     Not Delegated - Gastroenterology: Antiemetics - ondansetron  Failed - 12/26/2023 10:45 AM      Failed - This refill cannot be delegated      Passed - AST in normal range and within 360 days    AST  Date Value Ref Range Status  07/26/2023 27 15 - 41 U/L Final         Passed - ALT in normal range and within 360 days    ALT  Date Value Ref Range Status  07/26/2023 22 0 - 44 U/L Final         Passed - Valid encounter within last 6 months    Recent Outpatient Visits           2 weeks ago Dysuria   [place] [facility] Everlene Parris LABOR, MD   4 months ago Essential hypertension   [place] [facility] [place], Marsa PARAS, DO   4 months ago Syncope, unspecified syncope type   [facility] Edman, Marsa PARAS, DO       Future Appointments             In 2 months Edman, Marsa PARAS, DO [place] [facility], [facility]

## 2024-01-29 ENCOUNTER — Other Ambulatory Visit: Payer: Self-pay | Admitting: Family Medicine

## 2024-01-29 DIAGNOSIS — R110 Nausea: Secondary | ICD-10-CM

## 2024-01-30 NOTE — Telephone Encounter (Signed)
 Requested medications are due for refill today.  yes  Requested medications are on the active medications list.  yes  Last refill. 12/26/2023 #30 0 rf  Future visit scheduled.   yes  Notes to clinic.  Refill not delegated.    Requested Prescriptions  Pending Prescriptions Disp Refills   ondansetron  (ZOFRAN -ODT) 8 MG disintegrating tablet [Pharmacy Med Name: ONDANSETRON  8 MG ODT] 30 tablet 0    Sig: 1 TABLET UNDER THE TONGUE EVERY 8 HOURS AS NEEDED NAUSEA AND VOMITING     Not Delegated - Gastroenterology: Antiemetics - ondansetron  Failed - 01/30/2024 12:13 PM      Failed - This refill cannot be delegated      Passed - AST in normal range and within 360 days    AST  Date Value Ref Range Status  07/26/2023 27 15 - 41 U/L Final         Passed - ALT in normal range and within 360 days    ALT  Date Value Ref Range Status  07/26/2023 22 0 - 44 U/L Final         Passed - Valid encounter within last 6 months    Recent Outpatient Visits           1 month ago Dysuria   [place] [facility] Everlene Parris LABOR, MD   5 months ago Essential hypertension   [place] [facility] Edman Marsa PARAS, DO   6 months ago Syncope, unspecified syncope type   [place] Edman, Marsa PARAS, DO       Future Appointments             In 1 month Edman, Marsa PARAS, DO [place] [facility], Main [address]

## 2024-03-09 ENCOUNTER — Other Ambulatory Visit: Payer: Self-pay

## 2024-03-09 ENCOUNTER — Telehealth: Payer: Self-pay | Admitting: Family Medicine

## 2024-03-09 DIAGNOSIS — R110 Nausea: Secondary | ICD-10-CM

## 2024-03-09 MED ORDER — ONDANSETRON 8 MG PO TBDP: 8.0000 mg | ORAL_TABLET | Freq: Two times a day (BID) | ORAL | 0 refills | Status: DC

## 2024-03-09 NOTE — Telephone Encounter (Unsigned)
 Copied from CRM #[id number]. Topic: Clinical - Medication Refill >> Mar 09, 2024 10:36 AM Lonell PEDLAR wrote: Medication: ondansetron  (ZOFRAN -ODT) 8 MG disintegrating tablet  Has the patient contacted their pharmacy? No  This is the patient's preferred pharmacy:  [facility] [place], [place] - [address] [place] [address] Phone: [phone] Fax: [phone]  Is this the correct pharmacy for this prescription? Yes If no, delete pharmacy and type the correct one.   Has the prescription been filled recently? Yes  Is the patient out of the medication? No  Has the patient been seen for an appointment in the last year OR does the patient have an upcoming appointment? Yes  Can we respond through MyChart? No  Agent: Please be advised that Rx refills may take up to 3 business days. We ask that you follow-up with your pharmacy.

## 2024-03-09 NOTE — Telephone Encounter (Signed)
 Refill sent pharmacy. Patient notified

## 2024-03-15 ENCOUNTER — Other Ambulatory Visit: Payer: Self-pay

## 2024-03-22 ENCOUNTER — Encounter: Payer: Self-pay | Admitting: Family Medicine

## 2024-03-23 ENCOUNTER — Other Ambulatory Visit: Payer: Self-pay | Admitting: Family Medicine

## 2024-03-23 DIAGNOSIS — Z0000 Encounter for general adult medical examination without abnormal findings: Secondary | ICD-10-CM

## 2024-03-23 DIAGNOSIS — E782 Mixed hyperlipidemia: Secondary | ICD-10-CM

## 2024-03-23 DIAGNOSIS — I10 Essential (primary) hypertension: Secondary | ICD-10-CM

## 2024-03-23 DIAGNOSIS — R7309 Other abnormal glucose: Secondary | ICD-10-CM

## 2024-03-23 DIAGNOSIS — E559 Vitamin D deficiency, unspecified: Secondary | ICD-10-CM

## 2024-03-23 DIAGNOSIS — E538 Deficiency of other specified B group vitamins: Secondary | ICD-10-CM

## 2024-03-24 ENCOUNTER — Other Ambulatory Visit

## 2024-03-24 ENCOUNTER — Ambulatory Visit (INDEPENDENT_AMBULATORY_CARE_PROVIDER_SITE_OTHER)

## 2024-03-24 DIAGNOSIS — Z23 Encounter for immunization: Secondary | ICD-10-CM | POA: Diagnosis not present

## 2024-03-25 LAB — LIPID PANEL
Cholesterol: 212 mg/dL — ABNORMAL HIGH (ref <200)
HDL: 69 mg/dL (ref >=50)
LDL Cholesterol (Calc): 125 mg/dL — ABNORMAL HIGH
Non-HDL Cholesterol (Calc): 143 mg/dL — ABNORMAL HIGH (ref <130)
Total CHOL/HDL Ratio: 3.1 (calc) (ref <5.0)
Triglycerides: 81 mg/dL (ref <150)

## 2024-03-25 LAB — COMPREHENSIVE METABOLIC PANEL WITH GFR
AG Ratio: 1 (calc) (ref 1.0–2.5)
ALT: 35 U/L — ABNORMAL HIGH (ref 6–29)
AST: 40 U/L — ABNORMAL HIGH (ref 10–35)
Albumin: 3.8 g/dL (ref 3.6–5.1)
Alkaline phosphatase (APISO): 323 U/L — ABNORMAL HIGH (ref 37–153)
BUN/Creatinine Ratio: 12 (calc) (ref 6–22)
BUN: 12 mg/dL (ref 7–25)
CO2: 27 mmol/L (ref 20–32)
Calcium: 9.4 mg/dL (ref 8.6–10.4)
Chloride: 103 mmol/L (ref 98–110)
Creat: 1.01 mg/dL — ABNORMAL HIGH (ref 0.60–1.00)
Globulin: 3.8 g/dL — ABNORMAL HIGH (ref 1.9–3.7)
Glucose, Bld: 86 mg/dL (ref 65–99)
Potassium: 4.1 mmol/L (ref 3.5–5.3)
Sodium: 138 mmol/L (ref 135–146)
Total Bilirubin: 0.6 mg/dL (ref 0.2–1.2)
Total Protein: 7.6 g/dL (ref 6.1–8.1)
eGFR: 57 mL/min/1.73m2 — ABNORMAL LOW (ref >=60)

## 2024-03-25 LAB — HEMOGLOBIN A1C
Hgb A1c MFr Bld: 5.3 % (ref <5.7)
Mean Plasma Glucose: 105 mg/dL
eAG (mmol/L): 5.8 mmol/L

## 2024-03-25 LAB — VITAMIN D 25 HYDROXY (VIT D DEFICIENCY, FRACTURES): Vit D, 25-Hydroxy: 57 ng/mL (ref 30–100)

## 2024-03-25 LAB — CBC WITH DIFFERENTIAL/PLATELET
Absolute Lymphocytes: 1609 {cells}/uL (ref 850–3900)
Absolute Monocytes: 351 {cells}/uL (ref 200–950)
Basophils Absolute: 70 {cells}/uL (ref 0–200)
Basophils Relative: 1.3 %
Eosinophils Absolute: 162 {cells}/uL (ref 15–500)
Eosinophils Relative: 3 %
HCT: 39 % (ref 35.0–45.0)
Hemoglobin: 12.7 g/dL (ref 11.7–15.5)
MCH: 29.6 pg (ref 27.0–33.0)
MCHC: 32.6 g/dL (ref 32.0–36.0)
MCV: 90.9 fL (ref 80.0–100.0)
MPV: 12.1 fL (ref 7.5–12.5)
Monocytes Relative: 6.5 %
Neutro Abs: 3208 {cells}/uL (ref 1500–7800)
Neutrophils Relative %: 59.4 %
Platelets: 164 Thousand/uL (ref 140–400)
RBC: 4.29 Million/uL (ref 3.80–5.10)
RDW: 12.5 % (ref 11.0–15.0)
Total Lymphocyte: 29.8 %
WBC: 5.4 Thousand/uL (ref 3.8–10.8)

## 2024-03-25 LAB — VITAMIN B12: Vitamin B-12: 1176 pg/mL — ABNORMAL HIGH (ref 200–1100)

## 2024-03-25 LAB — TSH: TSH: 1.89 m[IU]/L (ref 0.40–4.50)

## 2024-03-27 ENCOUNTER — Inpatient Hospital Stay
Admission: EM | Admit: 2024-03-27 | Discharge: 2024-03-30 | DRG: 871 | Disposition: A | Discharge status: Home Health | Attending: Internal Medicine | Admitting: Internal Medicine

## 2024-03-27 DIAGNOSIS — Z79899 Other long term (current) drug therapy: Secondary | ICD-10-CM

## 2024-03-27 DIAGNOSIS — D696 Thrombocytopenia, unspecified: Secondary | ICD-10-CM | POA: Diagnosis present

## 2024-03-27 DIAGNOSIS — J90 Pleural effusion, not elsewhere classified: Secondary | ICD-10-CM | POA: Diagnosis present

## 2024-03-27 DIAGNOSIS — I10 Essential (primary) hypertension: Secondary | ICD-10-CM | POA: Diagnosis present

## 2024-03-27 DIAGNOSIS — Z8719 Personal history of other diseases of the digestive system: Secondary | ICD-10-CM

## 2024-03-27 DIAGNOSIS — R059 Cough, unspecified: Secondary | ICD-10-CM | POA: Diagnosis present

## 2024-03-27 DIAGNOSIS — Z85048 Personal history of other malignant neoplasm of rectum, rectosigmoid junction, and anus: Secondary | ICD-10-CM

## 2024-03-27 DIAGNOSIS — N390 Urinary tract infection, site not specified: Principal | ICD-10-CM | POA: Diagnosis present

## 2024-03-27 DIAGNOSIS — J189 Pneumonia, unspecified organism: Secondary | ICD-10-CM | POA: Diagnosis present

## 2024-03-27 DIAGNOSIS — E876 Hypokalemia: Secondary | ICD-10-CM | POA: Diagnosis present

## 2024-03-27 DIAGNOSIS — A419 Sepsis, unspecified organism: Principal | ICD-10-CM | POA: Diagnosis present

## 2024-03-27 DIAGNOSIS — E785 Hyperlipidemia, unspecified: Secondary | ICD-10-CM | POA: Diagnosis present

## 2024-03-27 DIAGNOSIS — Z8249 Family history of ischemic heart disease and other diseases of the circulatory system: Secondary | ICD-10-CM

## 2024-03-27 DIAGNOSIS — J9601 Acute respiratory failure with hypoxia: Secondary | ICD-10-CM

## 2024-03-27 DIAGNOSIS — K7460 Unspecified cirrhosis of liver: Secondary | ICD-10-CM | POA: Diagnosis present

## 2024-03-27 DIAGNOSIS — B9620 Unspecified Escherichia coli [E. coli] as the cause of diseases classified elsewhere: Secondary | ICD-10-CM | POA: Diagnosis present

## 2024-03-27 DIAGNOSIS — Z87891 Personal history of nicotine dependence: Secondary | ICD-10-CM

## 2024-03-27 DIAGNOSIS — R55 Syncope and collapse: Secondary | ICD-10-CM | POA: Diagnosis present

## 2024-03-27 DIAGNOSIS — Z888 Allergy status to other drugs, medicaments and biological substances status: Secondary | ICD-10-CM

## 2024-03-27 DIAGNOSIS — G9341 Metabolic encephalopathy: Secondary | ICD-10-CM | POA: Diagnosis present

## 2024-03-27 DIAGNOSIS — K219 Gastro-esophageal reflux disease without esophagitis: Secondary | ICD-10-CM | POA: Diagnosis present

## 2024-03-27 DIAGNOSIS — R0902 Hypoxemia: Secondary | ICD-10-CM | POA: Diagnosis not present

## 2024-03-27 DIAGNOSIS — R748 Abnormal levels of other serum enzymes: Secondary | ICD-10-CM | POA: Diagnosis present

## 2024-03-27 DIAGNOSIS — R4182 Altered mental status, unspecified: Secondary | ICD-10-CM

## 2024-03-27 DIAGNOSIS — R54 Age-related physical debility: Secondary | ICD-10-CM | POA: Diagnosis present

## 2024-03-27 DIAGNOSIS — Z800 Family history of malignant neoplasm of digestive organs: Secondary | ICD-10-CM

## 2024-03-28 ENCOUNTER — Emergency Department

## 2024-03-28 ENCOUNTER — Other Ambulatory Visit: Payer: Self-pay

## 2024-03-28 DIAGNOSIS — N390 Urinary tract infection, site not specified: Secondary | ICD-10-CM

## 2024-03-28 DIAGNOSIS — I10 Essential (primary) hypertension: Secondary | ICD-10-CM

## 2024-03-28 DIAGNOSIS — Z8719 Personal history of other diseases of the digestive system: Secondary | ICD-10-CM

## 2024-03-28 DIAGNOSIS — R55 Syncope and collapse: Secondary | ICD-10-CM

## 2024-03-28 DIAGNOSIS — G9341 Metabolic encephalopathy: Secondary | ICD-10-CM

## 2024-03-28 DIAGNOSIS — Z85048 Personal history of other malignant neoplasm of rectum, rectosigmoid junction, and anus: Secondary | ICD-10-CM

## 2024-03-28 DIAGNOSIS — R4182 Altered mental status, unspecified: Secondary | ICD-10-CM

## 2024-03-28 DIAGNOSIS — A419 Sepsis, unspecified organism: Secondary | ICD-10-CM | POA: Diagnosis present

## 2024-03-28 DIAGNOSIS — R059 Cough, unspecified: Secondary | ICD-10-CM

## 2024-03-28 LAB — COMPREHENSIVE METABOLIC PANEL WITH GFR
ALT: 25 U/L (ref 0–44)
AST: 37 U/L (ref 15–41)
Albumin: 3.2 g/dL — ABNORMAL LOW (ref 3.5–5.0)
Alkaline Phosphatase: 316 U/L — ABNORMAL HIGH (ref 38–126)
Anion gap: 12 (ref 5–15)
BUN: 18 mg/dL (ref 8–23)
CO2: 23 mmol/L (ref 22–32)
Calcium: 8.7 mg/dL — ABNORMAL LOW (ref 8.9–10.3)
Chloride: 102 mmol/L (ref 98–111)
Creatinine, Ser: 1 mg/dL (ref 0.44–1.00)
GFR, Estimated: 57 mL/min — ABNORMAL LOW (ref >60)
Glucose, Bld: 124 mg/dL — ABNORMAL HIGH (ref 70–99)
Potassium: 3.4 mmol/L — ABNORMAL LOW (ref 3.5–5.1)
Sodium: 137 mmol/L (ref 135–145)
Total Bilirubin: 1 mg/dL (ref 0.0–1.2)
Total Protein: 7.9 g/dL (ref 6.5–8.1)

## 2024-03-28 LAB — CORTISOL-AM, BLOOD: Cortisol - AM: 22 ug/dL (ref 6.7–22.6)

## 2024-03-28 LAB — LACTIC ACID, PLASMA
Lactic Acid, Venous: 1 mmol/L (ref 0.5–1.9)
Lactic Acid, Venous: 1.9 mmol/L (ref 0.5–1.9)

## 2024-03-28 LAB — CBC WITH DIFFERENTIAL/PLATELET
Abs Immature Granulocytes: 0.05 K/uL (ref 0.00–0.07)
Basophils Absolute: 0 K/uL (ref 0.0–0.1)
Basophils Relative: 0 %
Eosinophils Absolute: 0 K/uL (ref 0.0–0.5)
Eosinophils Relative: 0 %
HCT: 35.9 % — ABNORMAL LOW (ref 36.0–46.0)
Hemoglobin: 11.7 g/dL — ABNORMAL LOW (ref 12.0–15.0)
Immature Granulocytes: 1 %
Lymphocytes Relative: 5 %
Lymphs Abs: 0.5 K/uL — ABNORMAL LOW (ref 0.7–4.0)
MCH: 29.3 pg (ref 26.0–34.0)
MCHC: 32.6 g/dL (ref 30.0–36.0)
MCV: 90 fL (ref 80.0–100.0)
Monocytes Absolute: 0.3 K/uL (ref 0.1–1.0)
Monocytes Relative: 3 %
Neutro Abs: 9.6 K/uL — ABNORMAL HIGH (ref 1.7–7.7)
Neutrophils Relative %: 91 %
Platelets: 124 K/uL — ABNORMAL LOW (ref 150–400)
RBC: 3.99 MIL/uL (ref 3.87–5.11)
RDW: 13 % (ref 11.5–15.5)
WBC: 10.5 K/uL (ref 4.0–10.5)
nRBC: 0 % (ref 0.0–0.2)

## 2024-03-28 LAB — URINALYSIS, W/ REFLEX TO CULTURE (INFECTION SUSPECTED)
Bacteria, UA: NONE SEEN
Bilirubin Urine: NEGATIVE
Glucose, UA: NEGATIVE mg/dL
Ketones, ur: NEGATIVE mg/dL
Nitrite: POSITIVE — AB
Protein, ur: NEGATIVE mg/dL
Specific Gravity, Urine: 1.012 (ref 1.005–1.030)
Squamous Epithelial / HPF: 0 /HPF (ref 0–5)
WBC, UA: >50 WBC/hpf (ref 0–5)
pH: 5 (ref 5.0–8.0)

## 2024-03-28 LAB — BLOOD CULTURE ID PANEL (REFLEXED) - BCID2

## 2024-03-28 LAB — TROPONIN I (HIGH SENSITIVITY)
Troponin I (High Sensitivity): 19 ng/L — ABNORMAL HIGH (ref <18)
Troponin I (High Sensitivity): 36 ng/L — ABNORMAL HIGH (ref <18)

## 2024-03-28 LAB — RESP PANEL BY RT-PCR (RSV, FLU A&B, COVID)  RVPGX2
Influenza A by PCR: NEGATIVE
Influenza B by PCR: NEGATIVE
Resp Syncytial Virus by PCR: NEGATIVE
SARS Coronavirus 2 by RT PCR: NEGATIVE

## 2024-03-28 LAB — PROTIME-INR
INR: 1.1 (ref 0.8–1.2)
Prothrombin Time: 14.3 s (ref 11.4–15.2)

## 2024-03-28 MED ORDER — VANCOMYCIN HCL IN DEXTROSE 1-5 GM/200ML-% IV SOLN
1000.0000 mg | Freq: Once | INTRAVENOUS | Status: AC
  Administered 2024-03-28: 1000 mg via INTRAVENOUS
  Filled 2024-03-28: qty 200

## 2024-03-28 MED ORDER — METRONIDAZOLE 500 MG/100ML IV SOLN
500.0000 mg | Freq: Once | INTRAVENOUS | Status: AC
  Administered 2024-03-28: 500 mg via INTRAVENOUS
  Filled 2024-03-28: qty 100

## 2024-03-28 MED ORDER — KETOROLAC TROMETHAMINE 15 MG/ML IJ SOLN: 15.0000 mg | Freq: Four times a day (QID) | INTRAMUSCULAR | Status: DC | PRN

## 2024-03-28 MED ORDER — PANTOPRAZOLE SODIUM 40 MG PO TBEC
40.0000 mg | DELAYED_RELEASE_TABLET | Freq: Every day | ORAL | Status: DC
  Administered 2024-03-28 – 2024-03-30 (×3): 40 mg via ORAL
  Filled 2024-03-28 (×3): qty 1

## 2024-03-28 MED ORDER — HYDROCODONE-ACETAMINOPHEN 5-325 MG PO TABS: 1.0000 | ORAL_TABLET | ORAL | Status: DC | PRN

## 2024-03-28 MED ORDER — SODIUM CHLORIDE 0.9 % IV SOLN
2.0000 g | INTRAVENOUS | Status: DC
  Administered 2024-03-28 – 2024-03-30 (×3): 2 g via INTRAVENOUS
  Filled 2024-03-28 (×3): qty 20

## 2024-03-28 MED ORDER — ENOXAPARIN SODIUM 40 MG/0.4ML IJ SOSY
0.5000 mg/kg | PREFILLED_SYRINGE | INTRAMUSCULAR | Status: DC
  Administered 2024-03-28 – 2024-03-30 (×3): 40 mg via SUBCUTANEOUS
  Filled 2024-03-28 (×3): qty 0.4

## 2024-03-28 MED ORDER — HYDRALAZINE HCL 25 MG PO TABS: 25.0000 mg | ORAL_TABLET | Freq: Three times a day (TID) | ORAL | Status: DC | PRN

## 2024-03-28 MED ORDER — LACTATED RINGERS IV SOLN
150.0000 mL/h | INTRAVENOUS | Status: AC
  Administered 2024-03-28 (×3): 150 mL/h via INTRAVENOUS

## 2024-03-28 MED ORDER — ONDANSETRON HCL 4 MG/2ML IJ SOLN
4.0000 mg | Freq: Four times a day (QID) | INTRAMUSCULAR | Status: DC | PRN
  Administered 2024-03-28: 4 mg via INTRAVENOUS
  Filled 2024-03-28 (×2): qty 2

## 2024-03-28 MED ORDER — DM-GUAIFENESIN ER 30-600 MG PO TB12
1.0000 | ORAL_TABLET | Freq: Two times a day (BID) | ORAL | Status: DC
  Administered 2024-03-28 – 2024-03-30 (×4): 1 via ORAL
  Filled 2024-03-28 (×4): qty 1

## 2024-03-28 MED ORDER — ONDANSETRON HCL 4 MG PO TABS: 4.0000 mg | ORAL_TABLET | Freq: Four times a day (QID) | ORAL | Status: DC | PRN

## 2024-03-28 MED ORDER — SODIUM CHLORIDE 0.9 % IV BOLUS (SEPSIS)
1000.0000 mL | Freq: Once | INTRAVENOUS | Status: AC
  Administered 2024-03-28: 1000 mL via INTRAVENOUS

## 2024-03-28 MED ORDER — ACETAMINOPHEN 325 MG PO TABS: 650.0000 mg | ORAL_TABLET | Freq: Four times a day (QID) | ORAL | Status: DC | PRN

## 2024-03-28 MED ORDER — SODIUM CHLORIDE 0.9 % IV SOLN
2.0000 g | Freq: Once | INTRAVENOUS | Status: AC
  Administered 2024-03-28: 2 g via INTRAVENOUS
  Filled 2024-03-28: qty 12.5

## 2024-03-28 MED ORDER — PRAVASTATIN SODIUM 20 MG PO TABS
20.0000 mg | ORAL_TABLET | Freq: Every day | ORAL | Status: DC
Start: 2024-03-28 — End: 2024-03-30
  Administered 2024-03-28 – 2024-03-29 (×2): 20 mg via ORAL
  Filled 2024-03-28 (×2): qty 1

## 2024-03-28 MED ORDER — ACETAMINOPHEN 650 MG RE SUPP
650.0000 mg | Freq: Four times a day (QID) | RECTAL | Status: DC | PRN
Start: 2024-03-28 — End: 2024-03-30

## 2024-03-28 NOTE — Hospital Course (Signed)
 Vanessa Reynolds

## 2024-03-28 NOTE — Progress Notes (Signed)
 CODE SEPSIS - PHARMACY COMMUNICATION  **Broad Spectrum Antibiotics should be administered within 1 hour of Sepsis diagnosis**  Time Code Sepsis Called/Page Received: 11/9 @ 0010   Antibiotics Ordered:  Vanc, metronidazole , cefepime   Time of 1st antibiotic administration: Vancomycin 1 gm IV X 1 on 11/9 @ 0026   Additional action taken by pharmacy:   If necessary, Name of Provider/Nurse Contacted:     Chastidy Ranker D ,PharmD Clinical Pharmacist  03/28/2024  12:58 AM

## 2024-03-28 NOTE — ED Notes (Signed)
 Patient transported to CT

## 2024-03-28 NOTE — Progress Notes (Signed)
  Progress Note   Patient: Vanessa Reynolds FMW:[id number] DOB: Apr 23, 1946 DOA: 03/27/2024     0 DOS: the patient was seen and examined on 03/28/2024   Brief hospital course: Partly taken from H&P.   Vanessa Reynolds is a 78 y.o. female with medical history significant for a syncopal event 07/2023 in the setting of acute colitis with reassuring cardiac workup (EF 60 to 65% without valvulopathy/Zio patch with frequent PACs) being admitted with sepsis secondary to UTI presenting with a syncopal event and confusion.   On presentation patient was febrile at 101.2, WBC 10.5, lactic acid 1.0.  Hemoglobin 11.7, mild thrombocytopenia at 124, elevated alkaline phosphatase at 316 with unknown baseline.  UA positive for nitrites and moderate leukocytes.  Respiratory panel negative. EKG with NSR and no acute significant abnormality. Chest x-ray was negative for any acute abnormality.  Patient received IV fluid per sepsis protocol and received 1 dose of broad-spectrum antibiotics with cefepime, vancomycin and Flagyl , and started on Rocephin .  Cultures were obtained.  11/9: Afebrile this morning, preliminary blood cultures 2/4 growing E. coli, urine cultures pending.  Assessment and Plan: * Sepsis secondary to UTI (HCC) 2/4 blood cultures growing E. coli, urine cultures pending. - Continue with Rocephin  -Follow-up final culture results  Syncope and collapse History of syncopal event 07/2023 in the setting of acute colitis  No external evidence of acute injury-CT head nonacute Had Zio patch March 2025 that showed frequent PACs.  Echocardiogram was unremarkable Possibly vasovagal related to acute infection Continuous cardiac monitoring Neurochecks with fall and aspiration precautions   Acute metabolic encephalopathy Improving but family does not think that she is still at baseline Secondary to acute infection Neurochecks as above with fall and aspiration precautions  Hypertension Blood pressure mildly  elevated.  Not on any antihypertensives at home. - As needed hydralazine -Continue to monitor  History of rectal cancer No acute issues suspected  History of gastroesophageal reflux (GERD) - Continue PPI  Subjective: Patient was still feeling little weak and some nausea, no vomiting.  She was also having some cough.  Physical Exam: Vitals:   03/28/24 0934 03/28/24 0934 03/28/24 1238 03/28/24 1256  BP: (!) 159/74 (!) 159/74 (!) 158/71 (!) 158/72  Pulse: 71 71 67 74  Resp: 20 20 (!) 24 16  Temp: 99.8 F (37.7 C)   98.7 F (37.1 C)  TempSrc: Oral   Oral  SpO2: 99% 99% 95% 93%  Weight:      Height:       General.  Frail elderly lady, in no acute distress. Pulmonary.  Lungs clear bilaterally, normal respiratory effort. CV.  Regular rate and rhythm, no JVD, rub or murmur. Abdomen.  Soft, nontender, nondistended, BS positive. CNS.  Alert and oriented .  No focal neurologic deficit. Extremities.  No edema, no cyanosis, pulses intact and symmetrical. Psychiatry.  Judgment and insight appears normal.   Data Reviewed: Prior data reviewed  Family Communication: Granddaughter is at bedside  Disposition: Status is: Observation The patient will require care spanning > 2 midnights and should be moved to inpatient because: Severity of illness  Planned Discharge Destination: Home with Home Health  DVT prophylaxis.  Lovenox  Time spent: 50 minutes  This record has been created using Conservation officer, historic buildings. Errors have been sought and corrected,but may not always be located. Such creation errors do not reflect on the standard of care.   Author: Amaryllis Dare, MD 03/28/2024 4:57 PM  For on call review [email or web address].

## 2024-03-28 NOTE — ED Notes (Signed)
 This RN with the assistance of Maria NT, cleaned pt, and changed brief and chucks pad

## 2024-03-28 NOTE — ED Triage Notes (Signed)
 PT BIB [facility]. PT exhibits AMS, baseline A&Ox4. Chief Complaint  Patient presents with   Loss of Consciousness    1x emesis at home, syncopal episode. New onset ams.    Blood pressure (!) 146/70, pulse 78, temperature (!) 101.2 F (38.4 C), temperature source Oral, resp. rate 16, SpO2 92%.

## 2024-03-28 NOTE — Assessment & Plan Note (Addendum)
 History of syncopal event 07/2023 in the setting of acute colitis  No external evidence of acute injury-CT head nonacute Had Zio patch March 2025 that showed frequent PACs.  Echocardiogram was unremarkable Possibly vasovagal related to acute infection Continuous cardiac monitoring Neurochecks with fall and aspiration precautions

## 2024-03-28 NOTE — Progress Notes (Signed)
 PHARMACY - PHYSICIAN COMMUNICATION CRITICAL VALUE ALERT - BLOOD CULTURE IDENTIFICATION (BCID)  Vanessa Reynolds is an 78 y.o. female w/ PMH of syncope, colitis who presented to [facility] on 03/27/2024 with a chief complaint of UTI  Assessment:  BCID E coli 2/4 bottles (both aerobic)   Name of physician contacted: Caleen, MD  Current antibiotics: ceftriaxone  2 grams IV every 24 hours  Changes to prescribed antibiotics recommended:  Patient is on recommended antibiotics - No changes needed  Results for orders placed or performed during the hospital encounter of 03/27/24  Blood Culture ID Panel (Reflexed) (Collected: 03/28/2024 12:17 AM)  Result Value Ref Range   Enterococcus faecalis NOT DETECTED NOT DETECTED   Enterococcus Faecium NOT DETECTED NOT DETECTED   Listeria monocytogenes NOT DETECTED NOT DETECTED   Staphylococcus species NOT DETECTED NOT DETECTED   Staphylococcus aureus (BCID) NOT DETECTED NOT DETECTED   Staphylococcus epidermidis NOT DETECTED NOT DETECTED   Staphylococcus lugdunensis NOT DETECTED NOT DETECTED   Streptococcus species NOT DETECTED NOT DETECTED   Streptococcus agalactiae NOT DETECTED NOT DETECTED   Streptococcus pneumoniae NOT DETECTED NOT DETECTED   Streptococcus pyogenes NOT DETECTED NOT DETECTED   A.calcoaceticus-baumannii NOT DETECTED NOT DETECTED   Bacteroides fragilis NOT DETECTED NOT DETECTED   Enterobacterales DETECTED (A) NOT DETECTED   Enterobacter cloacae complex NOT DETECTED NOT DETECTED   Escherichia coli DETECTED (A) NOT DETECTED   Klebsiella aerogenes NOT DETECTED NOT DETECTED   Klebsiella oxytoca NOT DETECTED NOT DETECTED   Klebsiella pneumoniae NOT DETECTED NOT DETECTED   Proteus species NOT DETECTED NOT DETECTED   Salmonella species NOT DETECTED NOT DETECTED   Serratia marcescens NOT DETECTED NOT DETECTED   Haemophilus influenzae NOT DETECTED NOT DETECTED   Neisseria meningitidis NOT DETECTED NOT DETECTED   Pseudomonas aeruginosa NOT  DETECTED NOT DETECTED   Stenotrophomonas maltophilia NOT DETECTED NOT DETECTED   Candida albicans NOT DETECTED NOT DETECTED   Candida auris NOT DETECTED NOT DETECTED   Candida glabrata NOT DETECTED NOT DETECTED   Candida krusei NOT DETECTED NOT DETECTED   Candida parapsilosis NOT DETECTED NOT DETECTED   Candida tropicalis NOT DETECTED NOT DETECTED   Cryptococcus neoformans/gattii NOT DETECTED NOT DETECTED   CTX-M ESBL NOT DETECTED NOT DETECTED   Carbapenem resistance IMP NOT DETECTED NOT DETECTED   Carbapenem resistance KPC NOT DETECTED NOT DETECTED   Carbapenem resistance NDM NOT DETECTED NOT DETECTED   Carbapenem resist OXA 48 LIKE NOT DETECTED NOT DETECTED   Carbapenem resistance VIM NOT DETECTED NOT DETECTED    Adriana JONETTA Bolster 03/28/2024  12:59 PM

## 2024-03-28 NOTE — ED Provider Notes (Signed)
 [facility] Provider Note    Event Date/Time   First MD Initiated Contact with Patient 03/28/24 0000     (approximate)   History   Loss of Consciousness (1x emesis at home, syncopal episode. New onset ams. )   HPI  Vanessa Reynolds is a 78 y.o. female with history of hypertension, hyperlipidemia, GERD, presenting with altered mental status and syncopal episode.  She had 1 episode of passing out at home.  Has noted a cough, had an episode of nausea vomiting.  She denies any chest pain or shortness of breath, no dysuria or hematuria, no diarrhea or abdominal pain, no back pain.  She denies any neck stiffness.  Per independent history from EMS, there was some concern about confusion as well given that her baseline mental status was ANO x 4 and she is not ANO x 2.     Physical Exam   Triage Vital Signs: ED Triage Vitals  Encounter Vitals Group     BP 03/28/24 0003 (!) 146/70     Girls Systolic BP Percentile --      Girls Diastolic BP Percentile --      Boys Systolic BP Percentile --      Boys Diastolic BP Percentile --      Pulse Rate 03/28/24 0003 78     Resp 03/28/24 0003 16     Temp 03/28/24 0003 (!) 101.2 F (38.4 C)     Temp Source 03/28/24 0003 Oral     SpO2 03/28/24 0001 91 %     Weight --      Height --      Head Circumference --      Peak Flow --      Pain Score 03/28/24 0004 0     Pain Loc --      Pain Education --      Exclude from Growth Chart --     Most recent vital signs: Vitals:   03/28/24 0133 03/28/24 0200  BP: (!) 141/68 139/67  Pulse: 73 72  Resp: 20 (!) 23  Temp: 99.5 F (37.5 C)   SpO2: 93% 93%     General: Awake, no distress.  ANO x 2 CV:  Good peripheral perfusion.  Resp:  Normal effort.  No tachypnea or respiratory distress Abd:  No distention.  Soft nontender Other:  No nuchal rigidity, moving all 4 extremities without focal weakness.   ED Results / Procedures / Treatments   Labs (all labs ordered are  listed, but only abnormal results are displayed) Labs Reviewed  COMPREHENSIVE METABOLIC PANEL WITH GFR - Abnormal; Notable for the following components:      Result Value   Potassium 3.4 (*)    Glucose, Bld 124 (*)    Calcium 8.7 (*)    Albumin 3.2 (*)    Alkaline Phosphatase 316 (*)    GFR, Estimated 57 (*)    All other components within normal limits  CBC WITH DIFFERENTIAL/PLATELET - Abnormal; Notable for the following components:   Hemoglobin 11.7 (*)    HCT 35.9 (*)    Platelets 124 (*)    Neutro Abs 9.6 (*)    Lymphs Abs 0.5 (*)    All other components within normal limits  URINALYSIS, W/ REFLEX TO CULTURE (INFECTION SUSPECTED) - Abnormal; Notable for the following components:   Color, Urine YELLOW (*)    APPearance CLOUDY (*)    Hgb urine dipstick SMALL (*)    Nitrite POSITIVE (*)  Leukocytes,Ua MODERATE (*)    All other components within normal limits  TROPONIN I (HIGH SENSITIVITY) - Abnormal; Notable for the following components:   Troponin I (High Sensitivity) 19 (*)    All other components within normal limits  RESP PANEL BY RT-PCR (RSV, FLU A&B, COVID)  RVPGX2  CULTURE, BLOOD (ROUTINE X 2)  CULTURE, BLOOD (ROUTINE X 2)  RESP PANEL BY RT-PCR (RSV, FLU A&B, COVID)  RVPGX2  URINE CULTURE  LACTIC ACID, PLASMA  PROTIME-INR  LACTIC ACID, PLASMA  TROPONIN I (HIGH SENSITIVITY)     EKG  EKG shows, sinus rhythm heart rate 79, normal QS, normal QTc, no obvious ischemic ST elevation, T wave flattening to lateral leads in V3, these are changed compared to prior   RADIOLOGY On my independent interpretation, chest x-ray without obvious consolidation   PROCEDURES:  Critical Care performed: Yes, see critical care procedure note(s)  .Critical Care  Performed by: Waymond Lorelle Cummins, MD Authorized by: Waymond Lorelle Cummins, MD   Critical care provider statement:    Critical care time (minutes):  40   Critical care was necessary to treat or prevent imminent or life-threatening  deterioration of the following conditions:  Sepsis   Critical care was time spent personally by me on the following activities:  Development of treatment plan with patient or surrogate, discussions with consultants, evaluation of patient's response to treatment, examination of patient, ordering and review of laboratory studies, ordering and review of radiographic studies, ordering and performing treatments and interventions, pulse oximetry, re-evaluation of patient's condition and review of old charts    MEDICATIONS ORDERED IN ED: Medications  sodium chloride  0.9 % bolus 1,000 mL (1,000 mLs Intravenous New Bag/Given 03/28/24 0023)  ceFEPIme (MAXIPIME) 2 g in sodium chloride  0.9 % 100 mL IVPB (0 g Intravenous Stopped 03/28/24 0057)  metroNIDAZOLE  (FLAGYL ) IVPB 500 mg (0 mg Intravenous Stopped 03/28/24 0133)  vancomycin (VANCOCIN) IVPB 1000 mg/200 mL premix (0 mg Intravenous Stopped 03/28/24 0133)     IMPRESSION / MDM / ASSESSMENT AND PLAN / ED COURSE  I reviewed the triage vital signs and the nursing notes.                              Differential diagnosis includes, but is not limited to, sepsis, possibly urinary or respiratory source, atypical ACS, arrhythmia, electrolyte derangements, dehydration, viral illness.  Will get labs, EKG, troponin, chest x-ray, CT head, respiratory viral swab, blood cultures, lactic acid.  Give her some IV fluids, IV antibiotics.  Patient's presentation is most consistent with acute presentation with potential threat to life or bodily function.  Independent interpretation of labs and imaging below.  Given her urosepsis, syncopal episode and altered mental status, patient will need to be admitted for further management.  She is already received antibiotics and received fluids.  Consult to hospitalist who will admit the patient.  She is admitted.  The patient is on the cardiac monitor to evaluate for evidence of arrhythmia and/or significant heart rate  changes.   Clinical Course as of 03/28/24 9779  [place] Mar 28, 2024  0036 DG Chest [place] 1 View 1. No acute cardiopulmonary process.  [TT]  0141 CT Head Wo Contrast 1. No acute intracranial abnormality.  [TT]  0141 Independent review of labs, no leukocytosis, lactate is normal, electrolytes not severely deranged, alk phos is elevated but rest of LFTs are normal, troponin is mildly elevated, UA is consistent with UTI. [TT]  Clinical Course User Index [TT] Waymond Lorelle Cummins, MD     FINAL CLINICAL IMPRESSION(S) / ED DIAGNOSES   Final diagnoses:  Sepsis, due to unspecified organism, unspecified whether acute organ dysfunction present [facility])  Urinary tract infection without hematuria, site unspecified  Altered mental status, unspecified altered mental status type  Cough, unspecified type  Syncope, unspecified syncope type     Rx / DC Orders   ED Discharge Orders     None        Note:  This document was prepared using Dragon voice recognition software and may include unintentional dictation errors.    Waymond Lorelle Cummins, MD 03/28/24 [phone]

## 2024-03-28 NOTE — Sepsis Progress Note (Signed)
 Elink monitoring for the code sepsis protocol.

## 2024-03-28 NOTE — ED Notes (Signed)
 Pt placed on bedpan

## 2024-03-28 NOTE — ED Notes (Signed)
 Pt ambulated to bathroom and back with supervision, gait steady.

## 2024-03-28 NOTE — Hospital Course (Addendum)
 Partly taken from H&P.   Vanessa Reynolds is a 77 y.o. female with medical history significant for a syncopal event 07/2023 in the setting of acute colitis with reassuring cardiac workup (EF 60 to 65% without valvulopathy/Zio patch with frequent PACs) being admitted with sepsis secondary to UTI presenting with a syncopal event and confusion.   On presentation patient was febrile at 101.2, WBC 10.5, lactic acid 1.0.  Hemoglobin 11.7, mild thrombocytopenia at 124, elevated alkaline phosphatase at 316 with unknown baseline.  UA positive for nitrites and moderate leukocytes.  Respiratory panel negative. EKG with NSR and no acute significant abnormality. Chest x-ray was negative for any acute abnormality.  Patient received IV fluid per sepsis protocol and received 1 dose of broad-spectrum antibiotics with cefepime, vancomycin and Flagyl , and started on Rocephin .  Cultures were obtained.  11/9: Afebrile this morning, preliminary blood cultures 2/4 growing E. coli, urine cultures pending.  11/10: Afebrile but continued to require oxygen with no baseline oxygen use.  CTA was obtained which was negative for PE but did show bibasilar infiltrate which could reflect pneumonia, did had a subpleural nodular area which need follow-up and it was also seen on prior CT. adding Zithromax for CAP coverage. Blood and urine cultures both growing E. coli-pending susceptibility.  11/11: Remained hemodynamically stable.  She was able to wean to room air while resting, transiently decreased up to 89% by discharge.  Of time with ambulation and quickly recovers.  Physical therapist evaluated her and recommended home health services which was ordered.  Patient has pansensitive E. coli.  She received ceftriaxone  and Zithromax while in the hospital and is being discharged on Augmentin  for total of 7 days of antibiotics and Zithromax which will cover her bacteremia, UTI and concern of pneumonia.  Discussed with patient and  granddaughter regarding a subpleural nodular opacity which needs surveillance as outpatient.  Patient was also started on low-dose losartan, does need to be titrated by PCP as her blood pressure remained elevated.  She will continue the rest of her home medications and follow-up with her providers for further assistance.

## 2024-03-28 NOTE — Assessment & Plan Note (Deleted)
Blood pressure currently stable.

## 2024-03-28 NOTE — Assessment & Plan Note (Signed)
 Blood pressure mildly elevated.  Not on any antihypertensives at home. - As needed hydralazine -Continue to monitor

## 2024-03-28 NOTE — Assessment & Plan Note (Addendum)
 Improving but family does not think that she is still at baseline Secondary to acute infection Neurochecks as above with fall and aspiration precautions

## 2024-03-28 NOTE — Assessment & Plan Note (Addendum)
 2/4 blood cultures growing E. coli, urine cultures pending. - Continue with Rocephin  -Follow-up final culture results

## 2024-03-28 NOTE — H&P (Signed)
 History and Physical    Patient: Vanessa Reynolds FMW:[id number] DOB: 10-Nov-1945 DOA: 03/27/2024 DOS: the patient was seen and examined on 03/28/2024 PCP: Edman Marsa PARAS, DO  Patient coming from: Home  Chief Complaint:  Chief Complaint  Patient presents with   Loss of Consciousness    1x emesis at home, syncopal episode. New onset ams.     HPI: Vanessa Reynolds is a 78 y.o. female with medical history significant for a syncopal event 07/2023 in the setting of acute colitis with reassuring cardiac workup (EF 60 to 65% without valvulopathy/Zio patch with frequent PACs) being admitted with sepsis secondary to UTI presenting with a syncopal event and confusion.  At baseline patient is alert and oriented x 4.  Per EMS report to ED provider from her facility she appeared confused during the day and on the night of arrival felt lightheaded and passed out.  No reports of head strike.  She is awake and alert in the ED.  Denies diarrhea or abdominal pain. On arrival temp 101.2 with pulse in the 70s respirations in the 20s.  BP 146/70 WBC normal at 10.5 with lactic acid 1.0 UA with positive nitrites and moderate leuks Respiratory viral panel negativeOther notable findings include hemoglobin of 11.7 and mild thrombocytopenia of 124 (147 in the past)) , mild hypokalemia of 3.4, elevated alk phos of 316 unknown baseline Troponin 19 EKG showed sinus rhythm at 79 CT head with no acute intracranial abnormality Chest x-ray nonacute  Patient started on sepsis fluids, initially treated with broad-spectrum antibiotics, cefepime vancomycin and metronidazole    Admission requested     Past Medical History:  Diagnosis Date   Abdominal pain, left lower quadrant 2012   Allergy    Breast screening, unspecified    H/O cystitis 2011   History of colon cancer    Hyperlipidemia    Hypertension    Nausea with vomiting    Personal history of malignant neoplasm of large intestine 2013   Personal history  of tobacco use, presenting hazards to health    Rectal cancer (HCC) 04/2010   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   Rectal cancer [facility])    Past Surgical History:  Procedure Laterality Date   APPENDECTOMY  December 2011   BILATERAL OOPHORECTOMY  December 2011   Completed at time of low anterior resection.   COLON SURGERY  03/14/10   lap assisted low anterior resection-adenocarcinoma of the rectum and a villous adenoma   COLONOSCOPY  2011,2012,2015   Dr. Viktoria, Dr. Margarete)   COLONOSCOPY WITH PROPOFOL  N/A 01/05/2020   Procedure: COLONOSCOPY WITH PROPOFOL ;  Surgeon: Dessa Reyes ORN, MD;  Location: [facility] ENDOSCOPY;  Service: Endoscopy;  Laterality: N/A;   Social History:  reports that she quit smoking about 26 years ago. Her smoking use included cigarettes. She started smoking about 56 years ago. She has a 60 pack-year smoking history. She has quit using smokeless tobacco. She reports that she does not drink alcohol and does not use drugs.  Allergies  Allergen Reactions   Fish Oil Other (See Comments)    Disruptions to Balance    Family History  Problem Relation Age of Onset   Hypertension Mother        deceased, age 109   Cancer Father        gastric cancer, deceased, mid 60's   Breast cancer Neg Hx     Prior to Admission medications   Medication Sig Start Date End Date Taking?  Authorizing Provider  lovastatin  (MEVACOR ) 20 MG tablet TAKE 1 TABLET BY MOUTH AT BEDTIME 11/11/23  Yes Karamalegos, Marsa PARAS, DO  omeprazole  (PRILOSEC) 40 MG capsule TAKE 1 CAPSULE BY MOUTH ONCE DAILY BEFORE BREAKFAST 11/11/23  Yes Karamalegos, Marsa PARAS, DO  fluticasone  (FLONASE ) 50 MCG/ACT nasal spray PLACE 2 SPRAYS INTO BOTH NOSTRILS DAILY.USE FOR 4-6 WEEKS THEN STOP AND USE SEASONALLY OR AS NEEDED 11/10/20   Edman, Marsa PARAS, DO  loratadine  (CLARITIN ) 10 MG tablet Take 1 tablet (10 mg total) by mouth daily. Use for 4-6 weeks then stop, and use as needed or seasonally  08/05/19   Edman, Marsa PARAS, DO  ondansetron  (ZOFRAN -ODT) 8 MG disintegrating tablet Take 1 tablet (8 mg total) by mouth 2 (two) times daily. 03/09/24   Karamalegos, Marsa PARAS, DO  sucralfate  (CARAFATE ) 1 g tablet Take 1 tablet (1 g total) by mouth 4 (four) times daily -  with meals and at bedtime. As needed for indigestion, abdominal pain 08/20/23   Edman Marsa PARAS, DO  vitamin B-12 (CYANOCOBALAMIN ) 100 MCG tablet Take 100 mcg by mouth daily.    [provider]    Physical Exam: Vitals:   03/28/24 0003 03/28/24 0030 03/28/24 0133 03/28/24 0200  BP: (!) 146/70 (!) 135/96 (!) 141/68 139/67  Pulse: 78 79 73 72  Resp: 16 (!) 23 20 (!) 23  Temp: (!) 101.2 F (38.4 C)  99.5 F (37.5 C)   TempSrc: Oral  Oral   SpO2: 92% 92% 93% 93%  Weight:   81.4 kg   Height:   5' 2 (1.575 m)    Physical Exam Vitals and nursing note reviewed.  Constitutional:      General: She is not in acute distress. HENT:     Head: Normocephalic and atraumatic.  Cardiovascular:     Rate and Rhythm: Normal rate and regular rhythm.     Heart sounds: Normal heart sounds.  Pulmonary:     Effort: Pulmonary effort is normal.     Breath sounds: Normal breath sounds.  Abdominal:     Palpations: Abdomen is soft.     Tenderness: There is no abdominal tenderness.  Neurological:     General: No focal deficit present.     Mental Status: Mental status is at baseline.     Labs on Admission: I have personally reviewed following labs and imaging studies  CBC: Recent Labs  Lab 03/24/24 1019 03/28/24 0012  WBC 5.4 10.5  NEUTROABS 3,208 9.6*  HGB 12.7 11.7*  HCT 39.0 35.9*  MCV 90.9 90.0  PLT 164 124*   Basic Metabolic Panel: Recent Labs  Lab 03/24/24 1019 03/28/24 0012  NA 138 137  K 4.1 3.4*  CL 103 102  CO2 27 23  GLUCOSE 86 124*  BUN 12 18  CREATININE 1.01* 1.00  CALCIUM 9.4 8.7*   GFR: Estimated Creatinine Clearance: 45.1 mL/min (by C-G formula based on SCr of 1  mg/dL). Liver Function Tests: Recent Labs  Lab 03/24/24 1019 03/28/24 0012  AST 40* 37  ALT 35* 25  ALKPHOS  --  316*  BILITOT 0.6 1.0  PROT 7.6 7.9  ALBUMIN  --  3.2*   No results for input(s): LIPASE, AMYLASE in the last 168 hours. No results for input(s): AMMONIA in the last 168 hours. Coagulation Profile: Recent Labs  Lab 03/28/24 0012  INR 1.1   Cardiac Enzymes: No results for input(s): CKTOTAL, CKMB, CKMBINDEX, TROPONINI in the last 168 hours. BNP (last 3 results) No results  for input(s): PROBNP in the last 8760 hours. HbA1C: No results for input(s): HGBA1C in the last 72 hours. CBG: No results for input(s): GLUCAP in the last 168 hours. Lipid Profile: No results for input(s): CHOL, HDL, LDLCALC, TRIG, CHOLHDL, LDLDIRECT in the last 72 hours. Thyroid  Function Tests: No results for input(s): TSH, T4TOTAL, FREET4, T3FREE, THYROIDAB in the last 72 hours. Anemia Panel: No results for input(s): VITAMINB12, FOLATE, FERRITIN, TIBC, IRON, RETICCTPCT in the last 72 hours. Urine analysis:    Component Value Date/Time   COLORURINE YELLOW (A) 03/28/2024 0017   APPEARANCEUR CLOUDY (A) 03/28/2024 0017   APPEARANCEUR Cloudy (A) 11/08/2019 0858   LABSPEC 1.012 03/28/2024 0017   PHURINE 5.0 03/28/2024 0017   GLUCOSEU NEGATIVE 03/28/2024 0017   HGBUR SMALL (A) 03/28/2024 0017   BILIRUBINUR NEGATIVE 03/28/2024 0017   BILIRUBINUR negative 12/11/2023 1454   BILIRUBINUR Negative 11/08/2019 0858   KETONESUR NEGATIVE 03/28/2024 0017   PROTEINUR NEGATIVE 03/28/2024 0017   UROBILINOGEN 0.2 12/11/2023 1454   NITRITE POSITIVE (A) 03/28/2024 0017   LEUKOCYTESUR MODERATE (A) 03/28/2024 0017    Radiological Exams on Admission: CT Head Wo Contrast Result Date: 03/28/2024 EXAM: CT HEAD WITHOUT CONTRAST 03/28/2024 01:16:26 AM TECHNIQUE: CT of the head was performed without the administration of intravenous contrast. Automated exposure  control, iterative reconstruction, and/or weight based adjustment of the mA/kV was utilized to reduce the radiation dose to as low as reasonably achievable. COMPARISON: 07/25/2023 CLINICAL HISTORY: Mental status change, unknown cause. FINDINGS: BRAIN AND VENTRICLES: No acute hemorrhage. No evidence of acute infarct. No hydrocephalus. No extra-axial collection. No mass effect or midline shift. Stable right parafalcine calcified meningioma. Subcortical and periventricular small vessel ischemic changes. ORBITS: No acute abnormality. SINUSES: Partial opacification of the right maxillary sinus. SOFT TISSUES AND SKULL: No acute soft tissue abnormality. No skull fracture. IMPRESSION: 1. No acute intracranial abnormality. Electronically signed by: Pinkie Pebbles MD 03/28/2024 01:19 AM EST RP Workstation: [id number]   DG Chest Port 1 View Result Date: 03/28/2024 EXAM: 1 VIEW(S) XRAY OF THE CHEST 03/28/2024 12:27:00 AM COMPARISON: 10/11/2019 CLINICAL HISTORY: Questionable sepsis - evaluate for abnormality FINDINGS: LUNGS AND PLEURA: No focal pulmonary opacity. No pulmonary edema. No pleural effusion. No pneumothorax. HEART AND MEDIASTINUM: Stable cardiomegaly. BONES AND SOFT TISSUES: No acute osseous abnormality. IMPRESSION: 1. No acute cardiopulmonary process. Electronically signed by: Pinkie Pebbles MD 03/28/2024 12:32 AM EST RP Workstation: [id number]   Data Reviewed for HPI: Relevant notes from primary care and specialist visits, past discharge summaries as available in EHR, including Care Everywhere. Prior diagnostic testing as pertinent to current admission diagnoses Updated medications and problem lists for reconciliation ED course, including vitals, labs, imaging, treatment and response to treatment Triage notes, nursing and pharmacy notes and ED provider's notes Notable results as noted above in HPI      Assessment and Plan: * Sepsis secondary to UTI [facility]) Rocephin  Sepsis fluids Follow  cultures  Syncope and collapse History of syncopal event 07/2023 in the setting of acute colitis  No external evidence of acute injury-CT head nonacute Had Zio patch March 2025 that showed frequent PACs.  Echocardiogram was unremarkable Possibly vasovagal related to acute infection Continuous cardiac monitoring Neurochecks with fall and aspiration precautions   Acute metabolic encephalopathy Patient with acute confusion and at baseline is alert and oriented.  X 4 Secondary to acute infection Neurochecks as above with fall and aspiration precautions  History of rectal cancer No acute issues suspected     DVT prophylaxis: Lovenox   Consults:  none  Advance Care Planning:   Code Status: Prior   Family Communication: none  Disposition Plan: Back to previous home environment  Severity of Illness: The appropriate patient status for this patient is OBSERVATION. Observation status is judged to be reasonable and necessary in order to provide the required intensity of service to ensure the patient's safety. The patient's presenting symptoms, physical exam findings, and initial radiographic and laboratory data in the context of their medical condition is felt to place them at decreased risk for further clinical deterioration. Furthermore, it is anticipated that the patient will be medically stable for discharge from the hospital within 2 midnights of admission.   Author: Delayne LULLA Solian, MD 03/28/2024 2:38 AM  For on call review [email or web address].

## 2024-03-28 NOTE — ED Notes (Signed)
 Pt returned from CT at this time.

## 2024-03-28 NOTE — ED Notes (Signed)
 Advised nurse that patient has ready bed

## 2024-03-28 NOTE — Assessment & Plan Note (Signed)
 Continue PPI.

## 2024-03-28 NOTE — Assessment & Plan Note (Addendum)
 No acute issues suspected

## 2024-03-29 ENCOUNTER — Inpatient Hospital Stay

## 2024-03-29 DIAGNOSIS — G9341 Metabolic encephalopathy: Secondary | ICD-10-CM

## 2024-03-29 DIAGNOSIS — J9601 Acute respiratory failure with hypoxia: Secondary | ICD-10-CM | POA: Diagnosis not present

## 2024-03-29 DIAGNOSIS — R748 Abnormal levels of other serum enzymes: Secondary | ICD-10-CM | POA: Diagnosis present

## 2024-03-29 DIAGNOSIS — R052 Subacute cough: Secondary | ICD-10-CM

## 2024-03-29 DIAGNOSIS — K7460 Unspecified cirrhosis of liver: Secondary | ICD-10-CM | POA: Diagnosis present

## 2024-03-29 DIAGNOSIS — N390 Urinary tract infection, site not specified: Secondary | ICD-10-CM | POA: Diagnosis present

## 2024-03-29 DIAGNOSIS — K219 Gastro-esophageal reflux disease without esophagitis: Secondary | ICD-10-CM | POA: Diagnosis present

## 2024-03-29 DIAGNOSIS — R54 Age-related physical debility: Secondary | ICD-10-CM | POA: Diagnosis present

## 2024-03-29 DIAGNOSIS — R55 Syncope and collapse: Secondary | ICD-10-CM | POA: Diagnosis present

## 2024-03-29 DIAGNOSIS — R4182 Altered mental status, unspecified: Secondary | ICD-10-CM | POA: Diagnosis not present

## 2024-03-29 DIAGNOSIS — Z800 Family history of malignant neoplasm of digestive organs: Secondary | ICD-10-CM | POA: Diagnosis not present

## 2024-03-29 DIAGNOSIS — R059 Cough, unspecified: Secondary | ICD-10-CM | POA: Diagnosis present

## 2024-03-29 DIAGNOSIS — N3000 Acute cystitis without hematuria: Secondary | ICD-10-CM

## 2024-03-29 DIAGNOSIS — Z79899 Other long term (current) drug therapy: Secondary | ICD-10-CM | POA: Diagnosis not present

## 2024-03-29 DIAGNOSIS — E785 Hyperlipidemia, unspecified: Secondary | ICD-10-CM | POA: Diagnosis present

## 2024-03-29 DIAGNOSIS — D696 Thrombocytopenia, unspecified: Secondary | ICD-10-CM | POA: Diagnosis present

## 2024-03-29 DIAGNOSIS — Z85048 Personal history of other malignant neoplasm of rectum, rectosigmoid junction, and anus: Secondary | ICD-10-CM | POA: Diagnosis not present

## 2024-03-29 DIAGNOSIS — Z87891 Personal history of nicotine dependence: Secondary | ICD-10-CM | POA: Diagnosis not present

## 2024-03-29 DIAGNOSIS — I10 Essential (primary) hypertension: Secondary | ICD-10-CM | POA: Diagnosis present

## 2024-03-29 DIAGNOSIS — J90 Pleural effusion, not elsewhere classified: Secondary | ICD-10-CM | POA: Diagnosis present

## 2024-03-29 DIAGNOSIS — R0902 Hypoxemia: Secondary | ICD-10-CM | POA: Diagnosis not present

## 2024-03-29 DIAGNOSIS — R410 Disorientation, unspecified: Secondary | ICD-10-CM

## 2024-03-29 DIAGNOSIS — Z8249 Family history of ischemic heart disease and other diseases of the circulatory system: Secondary | ICD-10-CM | POA: Diagnosis not present

## 2024-03-29 DIAGNOSIS — A419 Sepsis, unspecified organism: Secondary | ICD-10-CM | POA: Diagnosis present

## 2024-03-29 DIAGNOSIS — E876 Hypokalemia: Secondary | ICD-10-CM | POA: Diagnosis present

## 2024-03-29 DIAGNOSIS — B9620 Unspecified Escherichia coli [E. coli] as the cause of diseases classified elsewhere: Secondary | ICD-10-CM | POA: Diagnosis present

## 2024-03-29 DIAGNOSIS — J189 Pneumonia, unspecified organism: Secondary | ICD-10-CM | POA: Diagnosis present

## 2024-03-29 DIAGNOSIS — Z888 Allergy status to other drugs, medicaments and biological substances status: Secondary | ICD-10-CM | POA: Diagnosis not present

## 2024-03-29 LAB — CBC
HCT: 34 % — ABNORMAL LOW (ref 36.0–46.0)
Hemoglobin: 11.2 g/dL — ABNORMAL LOW (ref 12.0–15.0)
MCH: 29.5 pg (ref 26.0–34.0)
MCHC: 32.9 g/dL (ref 30.0–36.0)
MCV: 89.5 fL (ref 80.0–100.0)
Platelets: 110 K/uL — ABNORMAL LOW (ref 150–400)
RBC: 3.8 MIL/uL — ABNORMAL LOW (ref 3.87–5.11)
RDW: 13.1 % (ref 11.5–15.5)
WBC: 7.2 K/uL (ref 4.0–10.5)
nRBC: 0 % (ref 0.0–0.2)

## 2024-03-29 LAB — BASIC METABOLIC PANEL WITH GFR
Anion gap: 10 (ref 5–15)
BUN: 12 mg/dL (ref 8–23)
CO2: 22 mmol/L (ref 22–32)
Calcium: 8.5 mg/dL — ABNORMAL LOW (ref 8.9–10.3)
Chloride: 104 mmol/L (ref 98–111)
Creatinine, Ser: 0.64 mg/dL (ref 0.44–1.00)
GFR, Estimated: >60 mL/min (ref >60)
Glucose, Bld: 98 mg/dL (ref 70–99)
Potassium: 3.4 mmol/L — ABNORMAL LOW (ref 3.5–5.1)
Sodium: 136 mmol/L (ref 135–145)

## 2024-03-29 MED ORDER — SODIUM CHLORIDE 0.9 % IV SOLN
500.0000 mg | INTRAVENOUS | Status: DC
  Administered 2024-03-29: 500 mg via INTRAVENOUS
  Filled 2024-03-29 (×2): qty 5

## 2024-03-29 MED ORDER — IOHEXOL 350 MG/ML SOLN
75.0000 mL | Freq: Once | INTRAVENOUS | Status: AC | PRN
  Administered 2024-03-29: 75 mL via INTRAVENOUS

## 2024-03-29 MED ORDER — IPRATROPIUM-ALBUTEROL 0.5-2.5 (3) MG/3ML IN SOLN
3.0000 mL | Freq: Four times a day (QID) | RESPIRATORY_TRACT | Status: DC
  Filled 2024-03-29: qty 3

## 2024-03-29 MED ORDER — IPRATROPIUM-ALBUTEROL 0.5-2.5 (3) MG/3ML IN SOLN: 3.0000 mL | RESPIRATORY_TRACT | Status: DC | PRN

## 2024-03-29 NOTE — Assessment & Plan Note (Signed)
 History of syncopal event 07/2023 in the setting of acute colitis  No external evidence of acute injury-CT head nonacute Had Zio patch March 2025 that showed frequent PACs.  Echocardiogram was unremarkable Possibly vasovagal related to acute infection Continue to monitor Neurochecks with fall and aspiration precautions - PT and OT evaluation ordered

## 2024-03-29 NOTE — Assessment & Plan Note (Signed)
 Improved Secondary to acute infection Neurochecks as above with fall and aspiration precautions

## 2024-03-29 NOTE — Assessment & Plan Note (Signed)
 Blood pressure mildly elevated.  Not on any antihypertensives at home. - As needed hydralazine -Continue to monitor

## 2024-03-29 NOTE — Assessment & Plan Note (Signed)
 2/4 blood cultures and urine culture growing E. coli, -pending susceptibility - Continue with Rocephin  -Follow-up final culture results

## 2024-03-29 NOTE — Progress Notes (Signed)
 Progress Note   Patient: Vanessa Reynolds FMW:[id number] DOB: [birth year] DOA: 03/27/2024     0 DOS: the patient was seen and examined on 03/29/2024   Brief hospital course: Partly taken from H&P.   MIKYLAH Reynolds is a 78 y.o. female with medical history significant for a syncopal event 07/2023 in the setting of acute colitis with reassuring cardiac workup (EF 60 to 65% without valvulopathy/Zio patch with frequent PACs) being admitted with sepsis secondary to UTI presenting with a syncopal event and confusion.   On presentation patient was febrile at 101.2, WBC 10.5, lactic acid 1.0.  Hemoglobin 11.7, mild thrombocytopenia at 124, elevated alkaline phosphatase at 316 with unknown baseline.  UA positive for nitrites and moderate leukocytes.  Respiratory panel negative. EKG with NSR and no acute significant abnormality. Chest x-ray was negative for any acute abnormality.  Patient received IV fluid per sepsis protocol and received 1 dose of broad-spectrum antibiotics with cefepime, vancomycin and Flagyl , and started on Rocephin .  Cultures were obtained.  11/9: Afebrile this morning, preliminary blood cultures 2/4 growing E. coli, urine cultures pending.  11/10: Afebrile but continued to require oxygen with no baseline oxygen use.  CTA was obtained which was negative for PE but did show bibasilar infiltrate which could reflect pneumonia, did had a subpleural nodular area which need follow-up and it was also seen on prior CT. adding Zithromax for CAP coverage. Blood and urine cultures both growing E. coli-pending susceptibility.  Assessment and Plan: * Sepsis secondary to UTI (HCC) 2/4 blood cultures and urine culture growing E. coli, -pending susceptibility - Continue with Rocephin  -Follow-up final culture results  Acute hypoxic respiratory failure (HCC) Patient continued to require 2 to 3 L of oxygen with no baseline oxygen use.  Having some cough.  CTA was obtained which was negative for PE but  did show mild bilateral pleural effusion, bilateral subpleural infiltrate which could represent pneumonia. And nodular subpleural opacity was again seen which need follow-up CT. Also shows liver cirrhosis. - Continue supplemental oxygen-wean as tolerated -Adding Zithromax for CAP coverage -Continue with ceftriaxone  -Supportive care  Syncope and collapse History of syncopal event 07/2023 in the setting of acute colitis  No external evidence of acute injury-CT head nonacute Had Zio patch March 2025 that showed frequent PACs.  Echocardiogram was unremarkable Possibly vasovagal related to acute infection Continue to monitor Neurochecks with fall and aspiration precautions - PT and OT evaluation ordered   Acute metabolic encephalopathy Improved Secondary to acute infection Neurochecks as above with fall and aspiration precautions  Hypertension Blood pressure mildly elevated.  Not on any antihypertensives at home. - As needed hydralazine -Continue to monitor  History of rectal cancer No acute issues suspected  History of gastroesophageal reflux (GERD) - Continue PPI  Subjective: Patient was still feeling weak when seen today.  Continued to have some cough.  Becoming hypoxic on room air again when oxygen was discontinued.  No baseline oxygen use.  Granddaughter is at bedside.  Physical Exam: Vitals:   03/29/24 0034 03/29/24 0348 03/29/24 0743 03/29/24 1301  BP: (!) 140/66 (!) 168/85 136/81 (!) 154/78  Pulse: 63 81 (!) 56 61  Resp: 20 20 15 15   Temp: 98.4 F (36.9 C) 98 F (36.7 C) 98.2 F (36.8 C) 98.1 F (36.7 C)  TempSrc:   Oral Oral  SpO2: 91% 94% 92% (!) 86%  Weight:      Height:       General.  Frail elderly lady, in no acute  distress. Pulmonary.  Lungs clear bilaterally, normal respiratory effort. CV.  Regular rate and rhythm, no JVD, rub or murmur. Abdomen.  Soft, nontender, nondistended, BS positive. CNS.  Alert and oriented .  No focal neurologic  deficit. Extremities.  No edema,  pulses intact and symmetrical. Psychiatry.  Judgment and insight appears normal.    Data Reviewed: Prior data reviewed  Family Communication: Discussed with granddaughter is at bedside  Disposition: Status is: Inpatient  inpatient because: Severity of illness  Planned Discharge Destination: Home with Home Health  DVT prophylaxis.  Lovenox  Time spent: 50 minutes  This record has been created using Conservation officer, historic buildings. Errors have been sought and corrected,but may not always be located. Such creation errors do not reflect on the standard of care.   Author: Amaryllis Dare, MD 03/29/2024 4:27 PM  For on call review [email or web address].

## 2024-03-29 NOTE — Plan of Care (Signed)

## 2024-03-29 NOTE — Care Management Obs Status (Signed)
 MEDICARE OBSERVATION STATUS NOTIFICATION   Patient Details  Name: Vanessa Reynolds MRN: [id number] Date of Birth: 1946/04/15   Medicare Observation Status Notification Given:  Yes    Rojelio SHAUNNA Rattler 03/29/2024, 10:31 AM

## 2024-03-29 NOTE — Assessment & Plan Note (Signed)
 Patient continued to require 2 to 3 L of oxygen with no baseline oxygen use.  Having some cough.  CTA was obtained which was negative for PE but did show mild bilateral pleural effusion, bilateral subpleural infiltrate which could represent pneumonia. And nodular subpleural opacity was again seen which need follow-up CT. Also shows liver cirrhosis. - Continue supplemental oxygen-wean as tolerated -Adding Zithromax for CAP coverage -Continue with ceftriaxone  -Supportive care

## 2024-03-30 DIAGNOSIS — R059 Cough, unspecified: Secondary | ICD-10-CM | POA: Diagnosis not present

## 2024-03-30 DIAGNOSIS — R4182 Altered mental status, unspecified: Secondary | ICD-10-CM | POA: Diagnosis not present

## 2024-03-30 DIAGNOSIS — A419 Sepsis, unspecified organism: Secondary | ICD-10-CM | POA: Diagnosis not present

## 2024-03-30 DIAGNOSIS — N390 Urinary tract infection, site not specified: Secondary | ICD-10-CM | POA: Diagnosis not present

## 2024-03-30 LAB — CULTURE, BLOOD (ROUTINE X 2): Special Requests: ADEQUATE

## 2024-03-30 LAB — CBC
HCT: 34.1 % — ABNORMAL LOW (ref 36.0–46.0)
Hemoglobin: 11.4 g/dL — ABNORMAL LOW (ref 12.0–15.0)
MCH: 29.3 pg (ref 26.0–34.0)
MCHC: 33.4 g/dL (ref 30.0–36.0)
MCV: 87.7 fL (ref 80.0–100.0)
Platelets: 121 K/uL — ABNORMAL LOW (ref 150–400)
RBC: 3.89 MIL/uL (ref 3.87–5.11)
RDW: 13 % (ref 11.5–15.5)
WBC: 5.8 K/uL (ref 4.0–10.5)
nRBC: 0 % (ref 0.0–0.2)

## 2024-03-30 LAB — PHOSPHORUS: Phosphorus: 2.8 mg/dL (ref 2.5–4.6)

## 2024-03-30 LAB — URINE CULTURE: Culture: 10000 — AB

## 2024-03-30 LAB — BASIC METABOLIC PANEL WITH GFR
Anion gap: 10 (ref 5–15)
BUN: 11 mg/dL (ref 8–23)
CO2: 25 mmol/L (ref 22–32)
Calcium: 8.5 mg/dL — ABNORMAL LOW (ref 8.9–10.3)
Chloride: 101 mmol/L (ref 98–111)
Creatinine, Ser: 0.66 mg/dL (ref 0.44–1.00)
GFR, Estimated: >60 mL/min (ref >60)
Glucose, Bld: 81 mg/dL (ref 70–99)
Potassium: 3.2 mmol/L — ABNORMAL LOW (ref 3.5–5.1)
Sodium: 136 mmol/L (ref 135–145)

## 2024-03-30 LAB — MAGNESIUM: Magnesium: 2 mg/dL (ref 1.7–2.4)

## 2024-03-30 MED ORDER — POTASSIUM CHLORIDE 20 MEQ PO PACK
60.0000 meq | PACK | Freq: Once | ORAL | Status: AC
  Administered 2024-03-30: 60 meq via ORAL
  Filled 2024-03-30: qty 3

## 2024-03-30 MED ORDER — LOSARTAN POTASSIUM 25 MG PO TABS: 25.0000 mg | ORAL_TABLET | Freq: Every day | ORAL | 1 refills | Status: DC

## 2024-03-30 MED ORDER — AMOXICILLIN-POT CLAVULANATE 875-125 MG PO TABS: 1.0000 | ORAL_TABLET | Freq: Two times a day (BID) | ORAL | 0 refills | Status: AC

## 2024-03-30 MED ORDER — AZITHROMYCIN 500 MG PO TABS: 500.0000 mg | ORAL_TABLET | Freq: Every day | ORAL | 0 refills | Status: AC

## 2024-03-30 MED ORDER — DM-GUAIFENESIN ER 30-600 MG PO TB12: 1.0000 | ORAL_TABLET | Freq: Two times a day (BID) | ORAL | 0 refills | Status: DC | PRN

## 2024-03-30 NOTE — Plan of Care (Signed)

## 2024-03-30 NOTE — TOC Transition Note (Signed)
 Transition of Care [facility]) - Discharge Note   Patient Details  Name: Vanessa Reynolds MRN: [id number] Date of Birth: 1945-05-30  Transition of Care [facility]) CM/SW Contact:  Alfonso Rummer, LCSW Phone Number: 03/30/2024, 1:04 PM   Clinical Narrative:     Pt will discharge home via family transport. [facility] arranged and dme order sent to [facility]. Pt granddaughter is aware of discharge and present to escort pt safely home. No further TOC needs identified.   Final next level of care: Home/Self Care Barriers to Discharge: Barriers Resolved   Patient Goals and CMS Choice            Discharge Placement             Home with [facility] home [facility]      Name of family member notified: Grand daughter Patient and family notified of of transfer: 03/30/24  Discharge Plan and Services Additional resources added to the After Visit Summary for                    DME Agency: [facility] Date DME Agency Contacted: 03/30/24   Representative spoke with at DME Agency: Mitch with [facility] HH Arranged: RN, PT, OT [facility] Agency: [facility] Date [facility] Agency Contacted: 03/30/24   Representative spoke with at [facility] Agency: Channing Ee  Social Drivers of Health (SDOH) Interventions SDOH Screenings   Food Insecurity: Patient Unable To Answer (03/28/2024)  Housing: Unknown (03/28/2024)  Transportation Needs: No Transportation Needs (03/28/2024)  Utilities: Not At Risk (03/28/2024)  Alcohol Screen: Low Risk  (09/09/2022)  Depression (PHQ2-9): High Risk (08/20/2023)  Financial Resource Strain: Low Risk  (08/27/2021)  Physical Activity: Inactive (08/27/2021)  Social Connections: Moderately Integrated (03/28/2024)  Stress: No Stress Concern Present (08/27/2021)  Tobacco Use: Medium Risk (03/28/2024)     Readmission Risk Interventions     No data to display

## 2024-03-30 NOTE — Discharge Summary (Addendum)
 Physician Discharge Summary   Patient: Vanessa Reynolds MRN: [id number] DOB: Jul 23, 1945  Admit date:     03/27/2024  Discharge date: 03/30/24  Discharge Physician: Amaryllis Dare   PCP: Edman Marsa PARAS, DO   Recommendations at discharge:  Please obtain CBC and BMP on follow-up Please obtain CT chest in 54-month to see the resolution of current findings and the stability of subpleural nodular opacity. Patient need titration off losartan depending on her response. Follow-up with primary care provider within a week  Discharge Diagnoses: Principal Problem:   Sepsis secondary to UTI [facility]) Active Problems:   Syncope and collapse   Acute hypoxic respiratory failure (HCC)   Acute metabolic encephalopathy   History of rectal cancer   Hypertension   History of gastroesophageal reflux (GERD)   Urinary tract infection without hematuria   Altered mental status   Cough   Hospital Course:   Vanessa Reynolds is a 78 y.o. female with medical history significant for a syncopal event 07/2023 in the setting of acute colitis with reassuring cardiac workup (EF 60 to 65% without valvulopathy/Zio patch with frequent PACs) being admitted with sepsis secondary to UTI presenting with a syncopal event and confusion.   On presentation patient was febrile at 101.2, WBC 10.5, lactic acid 1.0.  Hemoglobin 11.7, mild thrombocytopenia at 124, elevated alkaline phosphatase at 316 with unknown baseline.  UA positive for nitrites and moderate leukocytes.  Respiratory panel negative. EKG with NSR and no acute significant abnormality. Chest x-ray was negative for any acute abnormality.  Patient received IV fluid per sepsis protocol and received 1 dose of broad-spectrum antibiotics with cefepime, vancomycin and Flagyl , and started on Rocephin .  Cultures were obtained.  11/9: Afebrile this morning, preliminary blood cultures 2/4 growing E. coli, urine cultures pending.  11/10: Afebrile but continued to require  oxygen with no baseline oxygen use.  CTA was obtained which was negative for PE but did show bibasilar infiltrate which could reflect pneumonia, did had a subpleural nodular area which need follow-up and it was also seen on prior CT. adding Zithromax for CAP coverage. Blood and urine cultures both growing E. coli-pending susceptibility.  11/11: Remained hemodynamically stable.  She was able to wean to room air while resting, transiently decreased up to 89% by discharge.  Of time with ambulation and quickly recovers.  Physical therapist evaluated her and recommended home health services which was ordered.  Patient has pansensitive E. coli.  She received ceftriaxone  and Zithromax while in the hospital and is being discharged on Augmentin  for total of 7 days of antibiotics and Zithromax which will cover her bacteremia, UTI and concern of pneumonia.  Discussed with patient and granddaughter regarding a subpleural nodular opacity which needs surveillance as outpatient.  Patient with advanced age and worsening generalized weakness, DME walker with seat was ordered to help with safe ambulation.  Patient was also started on low-dose losartan, does need to be titrated by PCP as her blood pressure remained elevated.  She will continue the rest of her home medications and follow-up with her providers for further assistance.  Assessment and Plan: * Sepsis secondary to UTI (HCC) 2/4 blood cultures and urine culture growing pansensitive E. coli, received ceftriaxone  while in the hospital and is being discharged on Augmentin  to complete a total of 7-day course.  Acute hypoxic respiratory failure (HCC) Patient continued to require 2 to 3 L of oxygen with no baseline oxygen use.  Having some cough.  CTA was obtained which was negative  for PE but did show mild bilateral pleural effusion, bilateral subpleural infiltrate which could represent pneumonia. And nodular subpleural opacity was again seen which need  follow-up CT. Also shows liver cirrhosis. Patient was able to wean back to room air and drops transiently to 89% with ambulation. -Adding Zithromax for CAP coverage -Continue with Augmentin  to complete the course -Supportive care  Syncope and collapse History of syncopal event 07/2023 in the setting of acute colitis  No external evidence of acute injury-CT head nonacute Had Zio patch March 2025 that showed frequent PACs.  Echocardiogram was unremarkable Possibly vasovagal related to acute infection Continue to monitor Neurochecks with fall and aspiration precautions - PT and OT evaluation -recommended home health which was ordered   Acute metabolic encephalopathy Improved Secondary to acute infection Neurochecks as above with fall and aspiration precautions  Hypertension Blood pressure remained elevated.  Not on any antihypertensives at home. Started on low-dose losartan-dose need to be titrated by PCP  History of rectal cancer No acute issues suspected  History of gastroesophageal reflux (GERD) - Continue PPI  Consultants: None Procedures performed: None Disposition: Home health Diet recommendation:  Discharge Diet Orders (From admission, onward)     Start     Ordered   03/30/24 0000  Diet - low sodium heart healthy        03/30/24 1053           Regular diet DISCHARGE MEDICATION: Allergies as of 03/30/2024       Reactions   Fish Oil Other (See Comments)   Disruptions to Balance        Medication List     TAKE these medications    amoxicillin -clavulanate 875-125 MG tablet Commonly known as: AUGMENTIN  Take 1 tablet by mouth 2 (two) times daily for 4 days. Start from tomorrow   azithromycin 500 MG tablet Commonly known as: Zithromax Take 1 tablet (500 mg total) by mouth daily for 3 days.   dextromethorphan-guaiFENesin 30-600 MG 12hr tablet Commonly known as: MUCINEX DM Take 1 tablet by mouth 2 (two) times daily as needed for cough.    fluticasone  50 MCG/ACT nasal spray Commonly known as: FLONASE  PLACE 2 SPRAYS INTO BOTH NOSTRILS DAILY.USE FOR 4-6 WEEKS THEN STOP AND USE SEASONALLY OR AS NEEDED   loratadine  10 MG tablet Commonly known as: CLARITIN  Take 1 tablet (10 mg total) by mouth daily. Use for 4-6 weeks then stop, and use as needed or seasonally   losartan 25 MG tablet Commonly known as: COZAAR Take 1 tablet (25 mg total) by mouth daily.   lovastatin  20 MG tablet Commonly known as: MEVACOR  TAKE 1 TABLET BY MOUTH AT BEDTIME   omeprazole  40 MG capsule Commonly known as: PRILOSEC TAKE 1 CAPSULE BY MOUTH ONCE DAILY BEFORE BREAKFAST   ondansetron  8 MG disintegrating tablet Commonly known as: ZOFRAN -ODT Take 1 tablet (8 mg total) by mouth 2 (two) times daily.   sucralfate  1 g tablet Commonly known as: Carafate  Take 1 tablet (1 g total) by mouth 4 (four) times daily -  with meals and at bedtime. As needed for indigestion, abdominal pain   vitamin B-12 100 MCG tablet Commonly known as: CYANOCOBALAMIN  Take 100 mcg by mouth daily.               Durable Medical Equipment  (From admission, onward)           Start     Ordered   03/30/24 1055  For home use only DME 4 wheeled rolling walker  with seat  Once       Question:  Patient needs a walker to treat with the following condition  Answer:  Generalized weakness   03/30/24 1054            Follow-up Information     Edman Marsa PARAS, DO. Schedule an appointment as soon as possible for a visit in 1 week(s).   Specialty: Family Medicine Why: Hospital Follow-up: November 18th 2 PM. Arrive at 1:45 PM. Bring your insurance card and photo Id to the appointment. Contact information: [address] [place] [address] [phone]                Discharge Exam: Vanessa Reynolds   03/28/24 0133  Weight: 81.4 kg   General.  Frail elderly lady, in no acute distress. Pulmonary.  Lungs clear bilaterally, normal respiratory effort. CV.   Regular rate and rhythm, no JVD, rub or murmur. Abdomen.  Soft, nontender, nondistended, BS positive. CNS.  Alert and oriented .  No focal neurologic deficit. Extremities.  No edema,  pulses intact and symmetrical. Psychiatry.  Judgment and insight appears normal.   Condition at discharge: stable  The results of significant diagnostics from this hospitalization (including imaging, microbiology, ancillary and laboratory) are listed below for reference.   Imaging Studies: CT Angio Chest Pulmonary Embolism (PE) W or WO Contrast Result Date: 03/29/2024 CLINICAL DATA:  Concern for pulmonary embolism. EXAM: CT ANGIOGRAPHY CHEST WITH CONTRAST TECHNIQUE: Multidetector CT imaging of the chest was performed using the standard protocol during bolus administration of intravenous contrast. Multiplanar CT image reconstructions and MIPs were obtained to evaluate the vascular anatomy. RADIATION DOSE REDUCTION: This exam was performed according to the departmental dose-optimization program which includes automated exposure control, adjustment of the mA and/or kV according to patient size and/or use of iterative reconstruction technique. CONTRAST:  75mL OMNIPAQUE  IOHEXOL  350 MG/ML SOLN COMPARISON:  Chest CT dated 07/25/2023. FINDINGS: Evaluation of this exam is limited due to respiratory motion. Cardiovascular: Borderline cardiomegaly. No pericardial effusion. Mild atherosclerotic calcification of the thoracic aorta. No aneurysmal dilatation. There is mild dilatation of the main pulmonary trunk suggestive of pulmonary hypertension. Evaluation of the pulmonary arteries is limited due to respiratory motion. No pulmonary artery embolus identified. Mediastinum/Nodes: Right hilar adenopathy measures 14 mm short axis. The esophagus is grossly unremarkable. No mediastinal fluid collection. Lungs/Pleura: Small bilateral pleural effusions. Bibasilar subpleural subsegmental consolidative changes may represent atelectasis or  pneumonia. There is background of centrilobular emphysema. No pneumothorax. There is a 9 x 17 mm subpleural ground-glass nodule or scarring in the left upper lobe, present on the prior CT. Attention on follow-up imaging recommended. The central airways are patent. Upper Abdomen: Cirrhosis. Musculoskeletal: Osteopenia with degenerative changes. No acute osseous pathology. Review of the MIP images confirms the above findings. IMPRESSION: 1. No CT evidence of pulmonary embolism. 2. Small bilateral pleural effusions with bibasilar subpleural subsegmental consolidative changes may represent atelectasis or pneumonia. 3. A 9 x 17 mm subpleural ground-glass nodule or scarring in the left upper lobe, present on the prior CT. Attention on follow-up imaging recommended. 4. Cirrhosis. 5. Aortic Atherosclerosis (ICD10-I70.0) and Emphysema (ICD10-J43.9). Electronically Signed   By: Vanetta Chou M.D.   On: 03/29/2024 14:53   CT Head Wo Contrast Result Date: 03/28/2024 EXAM: CT HEAD WITHOUT CONTRAST 03/28/2024 01:16:26 AM TECHNIQUE: CT of the head was performed without the administration of intravenous contrast. Automated exposure control, iterative reconstruction, and/or weight based adjustment of the mA/kV was utilized to reduce the  radiation dose to as low as reasonably achievable. COMPARISON: 07/25/2023 CLINICAL HISTORY: Mental status change, unknown cause. FINDINGS: BRAIN AND VENTRICLES: No acute hemorrhage. No evidence of acute infarct. No hydrocephalus. No extra-axial collection. No mass effect or midline shift. Stable right parafalcine calcified meningioma. Subcortical and periventricular small vessel ischemic changes. ORBITS: No acute abnormality. SINUSES: Partial opacification of the right maxillary sinus. SOFT TISSUES AND SKULL: No acute soft tissue abnormality. No skull fracture. IMPRESSION: 1. No acute intracranial abnormality. Electronically signed by: Pinkie Pebbles MD 03/28/2024 01:19 AM EST RP Workstation:  [id number]   DG Chest Port 1 View Result Date: 03/28/2024 EXAM: 1 VIEW(S) XRAY OF THE CHEST 03/28/2024 12:27:00 AM COMPARISON: 10/11/2019 CLINICAL HISTORY: Questionable sepsis - evaluate for abnormality FINDINGS: LUNGS AND PLEURA: No focal pulmonary opacity. No pulmonary edema. No pleural effusion. No pneumothorax. HEART AND MEDIASTINUM: Stable cardiomegaly. BONES AND SOFT TISSUES: No acute osseous abnormality. IMPRESSION: 1. No acute cardiopulmonary process. Electronically signed by: Pinkie Pebbles MD 03/28/2024 12:32 AM EST RP Workstation: [id number]    Microbiology: Results for orders placed or performed during the hospital encounter of 03/27/24  Resp panel by RT-PCR (RSV, Flu A&B, Covid) Anterior Nasal Swab     Status: None   Collection Time: 03/28/24 12:12 AM   Specimen: Anterior Nasal Swab  Result Value Ref Range Status   SARS Coronavirus 2 by RT PCR NEGATIVE NEGATIVE Final    Comment: (NOTE) SARS-CoV-2 target nucleic acids are NOT DETECTED.  The SARS-CoV-2 RNA is generally detectable in upper respiratory specimens during the acute phase of infection. The lowest concentration of SARS-CoV-2 viral copies this assay can detect is 138 copies/mL. A negative result does not preclude SARS-Cov-2 infection and should not be used as the sole basis for treatment or other patient management decisions. A negative result may occur with  improper specimen collection/handling, submission of specimen other than nasopharyngeal swab, presence of viral mutation(s) within the areas targeted by this assay, and inadequate number of viral copies(<138 copies/mL). A negative result must be combined with clinical observations, patient history, and epidemiological information. The expected result is Negative.  Fact Sheet for Patients:  bloggercourse.com  Fact Sheet for Healthcare Providers:  seriousbroker.it  This test is no t yet approved or cleared by  the [place]  FDA and  has been authorized for detection and/or diagnosis of SARS-CoV-2 by FDA under an Emergency Use Authorization (EUA). This EUA will remain  in effect (meaning this test can be used) for the duration of the COVID-19 declaration under Section 564(b)(1) of the Act, 21 U.S.C.section 360bbb-3(b)(1), unless the authorization is terminated  or revoked sooner.       Influenza A by PCR NEGATIVE NEGATIVE Final   Influenza B by PCR NEGATIVE NEGATIVE Final    Comment: (NOTE) The Xpert Xpress SARS-CoV-2/FLU/RSV plus assay is intended as an aid in the diagnosis of influenza from Nasopharyngeal swab specimens and should not be used as a sole basis for treatment. Nasal washings and aspirates are unacceptable for Xpert Xpress SARS-CoV-2/FLU/RSV testing.  Fact Sheet for Patients: bloggercourse.com  Fact Sheet for Healthcare Providers: seriousbroker.it  This test is not yet approved or cleared by the [place]  FDA and has been authorized for detection and/or diagnosis of SARS-CoV-2 by FDA under an Emergency Use Authorization (EUA). This EUA will remain in effect (meaning this test can be used) for the duration of the COVID-19 declaration under Section 564(b)(1) of the Act, 21 U.S.C. section 360bbb-3(b)(1), unless the authorization is terminated or revoked.  Resp Syncytial Virus by PCR NEGATIVE NEGATIVE Final    Comment: (NOTE) Fact Sheet for Patients: bloggercourse.com  Fact Sheet for Healthcare Providers: seriousbroker.it  This test is not yet approved or cleared by the [place]  FDA and has been authorized for detection and/or diagnosis of SARS-CoV-2 by FDA under an Emergency Use Authorization (EUA). This EUA will remain in effect (meaning this test can be used) for the duration of the COVID-19 declaration under Section 564(b)(1) of the Act, 21  U.S.C. section 360bbb-3(b)(1), unless the authorization is terminated or revoked.  Performed at [facility], [address] [place], [place] [address]   Blood Culture (routine x 2)     Status: Abnormal   Collection Time: 03/28/24 12:12 AM   Specimen: BLOOD  Result Value Ref Range Status   Specimen Description   Final    BLOOD RIGHT ANTECUBITAL Performed at [facility], [address], [place], [place] [address]    Special Requests   Final    BOTTLES DRAWN AEROBIC AND ANAEROBIC Blood Culture adequate volume Performed at [facility], [address], [place], [place] [address]    Culture  Setup Time   Final    GRAM NEGATIVE RODS AEROBIC BOTTLE ONLY CRITICAL RESULT CALLED TO, READ BACK BY AND VERIFIED WITH: RODNEY GRUBB @1254  03/28/24 MJU Performed at [facility], [address] [place], [place] [address]    Culture (A)  Final    ESCHERICHIA COLI SUSCEPTIBILITIES PERFORMED ON PREVIOUS CULTURE WITHIN THE LAST 5 DAYS. Performed at [facility], [address] [place], [place] [address]    Report Status 03/30/2024 FINAL  Final  Blood Culture (routine x 2)     Status: Abnormal   Collection Time: 03/28/24 12:17 AM   Specimen: BLOOD  Result Value Ref Range Status   Specimen Description   Final    BLOOD LEFT ANTECUBITAL Performed at [facility], [address] [place], [place] [address]    Special Requests   Final    BOTTLES DRAWN AEROBIC AND ANAEROBIC Blood Culture results may not be optimal due to an inadequate volume of blood received in culture bottles Performed at [facility], [address] [place], [place] [address]    Culture  Setup Time   Final    GRAM NEGATIVE RODS AEROBIC BOTTLE ONLY CRITICAL RESULT CALLED TO, READ BACK BY AND VERIFIED WITH: RODNEY GRUBB @1254  03/28/24 MJU Performed at [facility], [address] [place], [place] [address]    Culture ESCHERICHIA COLI (A)  Final    Report Status 03/30/2024 FINAL  Final   Organism ID, Bacteria ESCHERICHIA COLI  Final      Susceptibility   Escherichia coli - MIC*    AMPICILLIN 4 SENSITIVE Sensitive     CEFAZOLIN (NON-URINE) <=1 SENSITIVE Sensitive     CEFEPIME <=0.12 SENSITIVE Sensitive     ERTAPENEM <=0.12 SENSITIVE Sensitive     CEFTRIAXONE  <=0.25 SENSITIVE Sensitive     CIPROFLOXACIN  <=0.06 SENSITIVE Sensitive     GENTAMICIN <=1 SENSITIVE Sensitive     MEROPENEM <=0.25 SENSITIVE Sensitive     TRIMETH/SULFA <=20 SENSITIVE Sensitive     AMPICILLIN/SULBACTAM <=2 SENSITIVE Sensitive     PIP/TAZO Value in next row Sensitive      <=4 SENSITIVEThis is a modified FDA-approved test that has been validated and its performance characteristics determined by the reporting laboratory.  This laboratory is certified under the Clinical Laboratory Improvement Amendments CLIA as qualified to  perform high complexity clinical laboratory testing.    * ESCHERICHIA COLI  Urine Culture     Status: Abnormal   Collection Time: 03/28/24 12:17 AM   Specimen: Urine, Random  Result Value Ref Range Status   Specimen Description   Final    URINE, RANDOM Performed at [facility], [address] [place], [place] [address]    Special Requests   Final    NONE Reflexed from [id number] Performed at [facility], [address] [place], [place] [address]    Culture 10,000 COLONIES/mL ESCHERICHIA COLI (A)  Final   Report Status 03/30/2024 FINAL  Final   Organism ID, Bacteria ESCHERICHIA COLI (A)  Final      Susceptibility   Escherichia coli - MIC*    AMPICILLIN 8 SENSITIVE Sensitive     CEFAZOLIN (URINE) Value in next row Sensitive      2 SENSITIVEThis is a modified FDA-approved test that has been validated and its performance characteristics determined by the reporting laboratory.  This laboratory is certified under the Clinical Laboratory Improvement Amendments CLIA as qualified to perform high complexity clinical laboratory  testing.    CEFEPIME Value in next row Sensitive      2 SENSITIVEThis is a modified FDA-approved test that has been validated and its performance characteristics determined by the reporting laboratory.  This laboratory is certified under the Clinical Laboratory Improvement Amendments CLIA as qualified to perform high complexity clinical laboratory testing.    ERTAPENEM Value in next row Sensitive      2 SENSITIVEThis is a modified FDA-approved test that has been validated and its performance characteristics determined by the reporting laboratory.  This laboratory is certified under the Clinical Laboratory Improvement Amendments CLIA as qualified to perform high complexity clinical laboratory testing.    CEFTRIAXONE  Value in next row Sensitive      2 SENSITIVEThis is a modified FDA-approved test that has been validated and its performance characteristics determined by the reporting laboratory.  This laboratory is certified under the Clinical Laboratory Improvement Amendments CLIA as qualified to perform high complexity clinical laboratory testing.    CIPROFLOXACIN  Value in next row Sensitive      2 SENSITIVEThis is a modified FDA-approved test that has been validated and its performance characteristics determined by the reporting laboratory.  This laboratory is certified under the Clinical Laboratory Improvement Amendments CLIA as qualified to perform high complexity clinical laboratory testing.    GENTAMICIN Value in next row Sensitive      2 SENSITIVEThis is a modified FDA-approved test that has been validated and its performance characteristics determined by the reporting laboratory.  This laboratory is certified under the Clinical Laboratory Improvement Amendments CLIA as qualified to perform high complexity clinical laboratory testing.    NITROFURANTOIN Value in next row Sensitive      2 SENSITIVEThis is a modified FDA-approved test that has been validated and its performance characteristics  determined by the reporting laboratory.  This laboratory is certified under the Clinical Laboratory Improvement Amendments CLIA as qualified to perform high complexity clinical laboratory testing.    TRIMETH/SULFA Value in next row Sensitive      2 SENSITIVEThis is a modified FDA-approved test that has been validated and its performance characteristics determined by the reporting laboratory.  This laboratory is certified under the Clinical Laboratory Improvement Amendments CLIA as qualified to perform high complexity clinical laboratory testing.    AMPICILLIN/SULBACTAM Value in next row Sensitive      2 SENSITIVEThis is a  modified FDA-approved test that has been validated and its performance characteristics determined by the reporting laboratory.  This laboratory is certified under the Clinical Laboratory Improvement Amendments CLIA as qualified to perform high complexity clinical laboratory testing.    PIP/TAZO Value in next row Sensitive      <=4 SENSITIVEThis is a modified FDA-approved test that has been validated and its performance characteristics determined by the reporting laboratory.  This laboratory is certified under the Clinical Laboratory Improvement Amendments CLIA as qualified to perform high complexity clinical laboratory testing.    MEROPENEM Value in next row Sensitive      <=4 SENSITIVEThis is a modified FDA-approved test that has been validated and its performance characteristics determined by the reporting laboratory.  This laboratory is certified under the Clinical Laboratory Improvement Amendments CLIA as qualified to perform high complexity clinical laboratory testing.    * 10,000 COLONIES/mL ESCHERICHIA COLI  Blood Culture ID Panel (Reflexed)     Status: Abnormal   Collection Time: 03/28/24 12:17 AM  Result Value Ref Range Status   Enterococcus faecalis NOT DETECTED NOT DETECTED Final   Enterococcus Faecium NOT DETECTED NOT DETECTED Final   Listeria monocytogenes NOT DETECTED  NOT DETECTED Final   Staphylococcus species NOT DETECTED NOT DETECTED Final   Staphylococcus aureus (BCID) NOT DETECTED NOT DETECTED Final   Staphylococcus epidermidis NOT DETECTED NOT DETECTED Final   Staphylococcus lugdunensis NOT DETECTED NOT DETECTED Final   Streptococcus species NOT DETECTED NOT DETECTED Final   Streptococcus agalactiae NOT DETECTED NOT DETECTED Final   Streptococcus pneumoniae NOT DETECTED NOT DETECTED Final   Streptococcus pyogenes NOT DETECTED NOT DETECTED Final   A.calcoaceticus-baumannii NOT DETECTED NOT DETECTED Final   Bacteroides fragilis NOT DETECTED NOT DETECTED Final   Enterobacterales DETECTED (A) NOT DETECTED Final    Comment: Enterobacterales represent a large order of gram negative bacteria, not a single organism. CRITICAL RESULT CALLED TO, READ BACK BY AND VERIFIED WITH: RODNEY GRUBB @1254  03/28/24 MJU    Enterobacter cloacae complex NOT DETECTED NOT DETECTED Final   Escherichia coli DETECTED (A) NOT DETECTED Final    Comment: CRITICAL RESULT CALLED TO, READ BACK BY AND VERIFIED WITH: RODNEY GRUBB @1254  03/28/24 MJU    Klebsiella aerogenes NOT DETECTED NOT DETECTED Final   Klebsiella oxytoca NOT DETECTED NOT DETECTED Final   Klebsiella pneumoniae NOT DETECTED NOT DETECTED Final   Proteus species NOT DETECTED NOT DETECTED Final   Salmonella species NOT DETECTED NOT DETECTED Final   Serratia marcescens NOT DETECTED NOT DETECTED Final   Haemophilus influenzae NOT DETECTED NOT DETECTED Final   Neisseria meningitidis NOT DETECTED NOT DETECTED Final   Pseudomonas aeruginosa NOT DETECTED NOT DETECTED Final   Stenotrophomonas maltophilia NOT DETECTED NOT DETECTED Final   Candida albicans NOT DETECTED NOT DETECTED Final   Candida auris NOT DETECTED NOT DETECTED Final   Candida glabrata NOT DETECTED NOT DETECTED Final   Candida krusei NOT DETECTED NOT DETECTED Final   Candida parapsilosis NOT DETECTED NOT DETECTED Final   Candida tropicalis NOT DETECTED  NOT DETECTED Final   Cryptococcus neoformans/gattii NOT DETECTED NOT DETECTED Final   CTX-M ESBL NOT DETECTED NOT DETECTED Final   Carbapenem resistance IMP NOT DETECTED NOT DETECTED Final   Carbapenem resistance KPC NOT DETECTED NOT DETECTED Final   Carbapenem resistance NDM NOT DETECTED NOT DETECTED Final   Carbapenem resist OXA 48 LIKE NOT DETECTED NOT DETECTED Final   Carbapenem resistance VIM NOT DETECTED NOT DETECTED Final    Comment: Performed at [employer]  [facility], [address] [place], [place] [address]    Labs: CBC: Recent Labs  Lab 03/24/24 1019 03/28/24 0012 03/29/24 0353 03/30/24 0324  WBC 5.4 10.5 7.2 5.8  NEUTROABS 3,208 9.6*  --   --   HGB 12.7 11.7* 11.2* 11.4*  HCT 39.0 35.9* 34.0* 34.1*  MCV 90.9 90.0 89.5 87.7  PLT 164 124* 110* 121*   Basic Metabolic Panel: Recent Labs  Lab 03/24/24 1019 03/28/24 0012 03/29/24 0353 03/30/24 0324  NA 138 137 136 136  K 4.1 3.4* 3.4* 3.2*  CL 103 102 104 101  CO2 27 23 22 25   GLUCOSE 86 124* 98 81  BUN 12 18 12 11   CREATININE 1.01* 1.00 0.64 0.66  CALCIUM 9.4 8.7* 8.5* 8.5*  MG  --   --   --  2.0  PHOS  --   --   --  2.8   Liver Function Tests: Recent Labs  Lab 03/24/24 1019 03/28/24 0012  AST 40* 37  ALT 35* 25  ALKPHOS  --  316*  BILITOT 0.6 1.0  PROT 7.6 7.9  ALBUMIN  --  3.2*   CBG: No results for input(s): GLUCAP in the last 168 hours.  Discharge time spent: greater than 30 minutes.  This record has been created using Conservation officer, historic buildings. Errors have been sought and corrected,but may not always be located. Such creation errors do not reflect on the standard of care.   Signed: Amaryllis Dare, MD [facility] 03/30/2024

## 2024-03-30 NOTE — Evaluation (Signed)
 Occupational Therapy Evaluation Patient Details Name: Vanessa Reynolds MRN: [id number] DOB: [birth year] Today's Date: 03/30/2024   History of Present Illness   Vanessa Reynolds is a 78 y.o. female with medical history significant for a syncopal event 07/2023 in the setting of acute colitis with reassuring cardiac workup (EF 60 to 65% without valvulopathy/Zio patch with frequent PACs) being admitted with sepsis secondary to UTI presenting with a syncopal event and confusion.     Clinical Impressions Patient was seen for OT evaluation this date. Prior to hospital admission, patient was independent with ADLs/IADLs and ambulating without AD. She was having syncope episodes but denies falls. Patient lives in a single story home with good support from spouse and numerous family members who are available to help upon discharge. Patient has been hospitalized due to AMS and dx with sepsis related to UTI. Patient on 2L of O2 at start of OT tx, not on O2 at baseline, O2 94%, OT removed O2 and educated on PLB with O2 improving to 96% on RA. Patient performed bed mobility, sit<>stand, ambulation to bathroom, toileting, grooming and return to bedroom on RA, O2 dropped to 89% but quickly returned to 93% with cues for PLB. OT notified team and left patient on RA. Grand daughter present and agreeable to call RN if any changes noted.  Patient presents with deficits in cogntiion (memory), standing tolerance/balance, overall activity tolerance, affecting safe and optimal ADL completion. Patient is currently requiring supervision and increased time to manage basic ADLs.  Paient would benefit from skilled OT services to address noted impairments and functional limitations (see below for any additional details) in order to maximize safety and independence while minimizing future risk of falls, injury, and readmission. nticipate the need for follow up OT services upon acute hospital DC.      If plan is discharge home,  recommend the following:   A little help with walking and/or transfers;A little help with bathing/dressing/bathroom;Assist for transportation;Help with stairs or ramp for entrance     Functional Status Assessment   Patient has had a recent decline in their functional status and demonstrates the ability to make significant improvements in function in a reasonable and predictable amount of time.     Equipment Recommendations   BSC/3in1     Recommendations for Other Services         Precautions/Restrictions   Precautions Precautions: Fall Recall of Precautions/Restrictions: Intact Restrictions Weight Bearing Restrictions Per Provider Order: No     Mobility Bed Mobility Overal bed mobility: Needs Assistance Bed Mobility: Supine to Sit     Supine to sit: Used rails, Supervision     General bed mobility comments: requested A, with encouragement she was able to complete with increased time    Transfers Overall transfer level: Needs assistance Equipment used: Rolling walker (2 wheels) Transfers: Sit to/from Stand Sit to Stand: Supervision, Contact guard assist           General transfer comment: used grab bars and r/w      Balance Overall balance assessment: Needs assistance Sitting-balance support: No upper extremity supported, Feet supported Sitting balance-Leahy Scale: Normal     Standing balance support: During functional activity Standing balance-Leahy Scale: Fair                             ADL either performed or assessed with clinical judgement   ADL Overall ADL's : Needs assistance/impaired     Grooming: Wash/dry  hands;Supervision/safety;Standing               Lower Body Dressing: Supervision/safety;Sit to/from stand Lower Body Dressing Details (indicate cue type and reason): donning/doffing socks while sitting EOB Toilet Transfer: Contact guard assist;Grab bars;Rolling walker (2 wheels)   Toileting- Clothing  Manipulation and Hygiene: Supervision/safety;Contact guard assist;Sitting/lateral lean;Sit to/from stand         General ADL Comments: minimal dyspnea with completing basic ADLs, no physical A needed     Vision         Perception         Praxis         Pertinent Vitals/Pain Pain Assessment Pain Assessment: No/denies pain     Extremity/Trunk Assessment Upper Extremity Assessment Upper Extremity Assessment: Overall WFL for tasks assessed   Lower Extremity Assessment Lower Extremity Assessment: Defer to PT evaluation       Communication Communication Communication: Impaired Factors Affecting Communication: Hearing impaired   Cognition Arousal: Alert Behavior During Therapy: WFL for tasks assessed/performed Cognition: Cognition impaired     Awareness: Online awareness impaired Memory impairment (select all impairments): Declarative long-term memory                       Following commands: Intact       Cueing  General Comments   Cueing Techniques: Verbal cues  on 2L at start of tx, ended on RA   Exercises     Shoulder Instructions      Home Living Family/patient expects to be discharged to:: Private residence Living Arrangements: Spouse/significant other Available Help at Discharge: Family Type of Home: House Home Access: Ramped entrance     Home Layout: One level     Bathroom Shower/Tub: Chief Strategy Officer: Handicapped height Bathroom Accessibility: Yes How Accessible: Accessible via walker Home Equipment: Grab bars - toilet;Grab bars - tub/shower;Rolling Walker (2 wheels)          Prior Functioning/Environment Prior Level of Function : Independent/Modified Independent;Driving;History of Falls (last six months)             Mobility Comments: ambulates without assist, reports some slide down falls ADLs Comments: manages self care and light IADLs on her own    OT Problem List: Decreased  strength;Decreased activity tolerance;Impaired balance (sitting and/or standing);Decreased safety awareness   OT Treatment/Interventions: Self-care/ADL training;Therapeutic exercise;Energy conservation;DME and/or AE instruction;Balance training;Patient/family education      OT Goals(Current goals can be found in the care plan section)   Acute Rehab OT Goals Patient Stated Goal: to go home OT Goal Formulation: With patient/family Time For Goal Achievement: 04/13/24 Potential to Achieve Goals: Good ADL Goals Pt Will Perform Grooming: with modified independence;standing Pt Will Perform Lower Body Dressing: with modified independence;sit to/from stand Pt Will Transfer to Toilet: with modified independence;ambulating;grab bars Pt Will Perform Toileting - Clothing Manipulation and hygiene: with modified independence;sit to/from stand   OT Frequency:  Min 2X/week    Co-evaluation              AM-PAC OT 6 Clicks Daily Activity     Outcome Measure Help from another person eating meals?: None Help from another person taking care of personal grooming?: None Help from another person toileting, which includes using toliet, bedpan, or urinal?: None Help from another person bathing (including washing, rinsing, drying)?: A Little Help from another person to put on and taking off regular upper body clothing?: None Help from another person to put on and taking  off regular lower body clothing?: A Little 6 Click Score: 22   End of Session Equipment Utilized During Treatment: Rolling walker (2 wheels) Nurse Communication: Other (comment) (O2 requirement)  Activity Tolerance: Patient tolerated treatment well Patient left: in chair;with call bell/phone within reach;with chair alarm set;with family/visitor present  OT Visit Diagnosis: Unsteadiness on feet (R26.81);Repeated falls (R29.6);Muscle weakness (generalized) (M62.81);History of falling (Z91.81)                Time: 9198-9157 OT Time  Calculation (min): 41 min Charges:  OT General Charges $OT Visit: 1 Visit OT Evaluation $OT Eval Low Complexity: 1 Low OT Treatments $Self Care/Home Management : 38-52 mins  Rogers Clause, OT/L [facility], 03/30/2024

## 2024-03-30 NOTE — Evaluation (Signed)
 Physical Therapy Evaluation Patient Details Name: EMIE SOMMERFELD MRN: [id number] DOB: 25-Jan-1946 Today's Date: 03/30/2024  History of Present Illness  LYSANDRA LOUGHMILLER is a 78 y.o. female with medical history significant for a syncopal event 07/2023 in the setting of acute colitis with reassuring cardiac workup (EF 60 to 65% without valvulopathy/Zio patch with frequent PACs) being admitted with sepsis secondary to UTI presenting with a syncopal event and confusion.  Clinical Impression  Pt is a very pleasant 78 y.o. female admitted d/t sepsis secondary to UTI. Pt has recently been weaned to RA and O2 was 98% at baseline. Pt was received in recliner and was able to perform STS to RW with supervision/CGA. Pt able to ambulate 96ft out into hallway with supervision/CGA before requesting a standing rest break, O2 at 94% with mild SOB. Pt continue to ambulate the remaining 10ft back to recliner. Once close enough, pt discarded RW, ambulated 3 steps, and turned around to sit in recliner with supervision/CGA. Upon seated in recliner, O2 dropped to 89% but recovered to 94% with PLB techniques. Pt left on RA, in chair with belongings within reach and granddaughter in room. Pt would benefit from continued skilled therapy services to address her activity tolerance and functional mobility.      If plan is discharge home, recommend the following: A little help with walking and/or transfers   Can travel by private vehicle        Equipment Recommendations None recommended by PT  Recommendations for Other Services       Functional Status Assessment Patient has had a recent decline in their functional status and demonstrates the ability to make significant improvements in function in a reasonable and predictable amount of time.     Precautions / Restrictions Precautions Precautions: Fall Recall of Precautions/Restrictions: Intact Restrictions Weight Bearing Restrictions Per Provider Order: No       Mobility  Bed Mobility               General bed mobility comments: NT - pt received in recliner    Transfers Overall transfer level: Needs assistance Equipment used: Rolling walker (2 wheels) Transfers: Sit to/from Stand Sit to Stand: Supervision, Contact guard assist           General transfer comment: STS to RW with supervision/CGA, no LOB    Ambulation/Gait Ambulation/Gait assistance: Supervision Gait Distance (Feet): 120 Feet Assistive device: Rolling walker (2 wheels) Gait Pattern/deviations: Decreased stride length, Trunk flexed       General Gait Details: pt ambulated with RW and supervision for 80 ft, took one standing rest break with O2 at 94% RA, then ambulated another 40 ft back to her recliner. No LOB or severe SOB symptoms.  Stairs            Wheelchair Mobility     Tilt Bed    Modified Rankin (Stroke Patients Only)       Balance Overall balance assessment: Needs assistance Sitting-balance support: No upper extremity supported, Feet supported Sitting balance-Leahy Scale: Normal     Standing balance support: During functional activity Standing balance-Leahy Scale: Fair                               Pertinent Vitals/Pain Pain Assessment Pain Assessment: No/denies pain    Home Living Family/patient expects to be discharged to:: Private residence Living Arrangements: Spouse/significant other Available Help at Discharge: Family Type of Home: House Home Access: Ramped  entrance       Home Layout: One level Home Equipment: Grab bars - toilet;Grab bars - tub/shower;Rolling Walker (2 wheels)      Prior Function Prior Level of Function : Independent/Modified Independent;Driving;History of Falls (last six months)             Mobility Comments: ambulates without assist, reports some slide down falls ADLs Comments: manages self care and light IADLs on her own     Extremity/Trunk Assessment   Upper Extremity  Assessment Upper Extremity Assessment: Overall WFL for tasks assessed    Lower Extremity Assessment Lower Extremity Assessment: Overall WFL for tasks assessed    Cervical / Trunk Assessment Cervical / Trunk Assessment: Kyphotic  Communication   Communication Communication: Impaired Factors Affecting Communication: Hearing impaired    Cognition Arousal: Alert Behavior During Therapy: WFL for tasks assessed/performed   PT - Cognitive impairments: No apparent impairments                         Following commands: Intact       Cueing Cueing Techniques: Verbal cues     General Comments General comments (skin integrity, edema, etc.): RA at start of session and O2 moniotred throuhgout. O2 remained mid 90%'s during ambulation. O2 dropped to 89% upon sitting in recliner but recovered to 94% with PLB techniques. Pt left on RA.    Exercises Other Exercises Other Exercises: edu on PLB techniques   Assessment/Plan    PT Assessment Patient needs continued PT services  PT Problem List Decreased activity tolerance       PT Treatment Interventions Gait training;Therapeutic activities    PT Goals (Current goals can be found in the Care Plan section)  Acute Rehab PT Goals Patient Stated Goal: to return home PT Goal Formulation: With patient Time For Goal Achievement: 04/13/24 Potential to Achieve Goals: Good    Frequency Min 1X/week     Co-evaluation               AM-PAC PT 6 Clicks Mobility  Outcome Measure Help needed turning from your back to your side while in a flat bed without using bedrails?: A Little Help needed moving from lying on your back to sitting on the side of a flat bed without using bedrails?: A Little Help needed moving to and from a bed to a chair (including a wheelchair)?: A Little Help needed standing up from a chair using your arms (e.g., wheelchair or bedside chair)?: A Little Help needed to walk in hospital room?: A Little Help  needed climbing 3-5 steps with a railing? : A Lot 6 Click Score: 17    End of Session Equipment Utilized During Treatment: Gait belt Activity Tolerance: Patient tolerated treatment well Patient left: in chair;with call bell/phone within reach;with chair alarm set;with family/visitor present Nurse Communication: Mobility status PT Visit Diagnosis: Other abnormalities of gait and mobility (R26.89)    Time: 9064-9048 PT Time Calculation (min) (ACUTE ONLY): 16 min   Charges:   PT Evaluation $PT Eval Low Complexity: 1 Low   PT General Charges $$ ACUTE PT VISIT: 1 Visit         Allena Bulls, SPT   Allena Bulls 03/30/2024, 12:43 PM

## 2024-03-31 ENCOUNTER — Telehealth: Payer: Self-pay

## 2024-03-31 NOTE — Patient Instructions (Signed)
 Visit Information  Thank you for taking time to visit with me today. Please don't hesitate to contact me if I can be of assistance to you before our next scheduled telephone appointment.  Our next appointment is by telephone on Wednesday November 19th at 11:30am  Following is a copy of your care plan:   Goals Addressed             This Visit's Progress    [facility]I Transitions of Care (TOC) Care Plan       Problems:  Recent Hospitalization for treatment of Sepsis due to UTI  Goal:  Over the next 30 days, the patient will not experience hospital readmission  Interventions:   Evaluation of current treatment plan related to Sepsis with UTI, self-management and patient's adherence to plan as established by provider. Discussed plans with patient for ongoing care management follow up and provided patient with direct contact information for care management team You have been given 2 different kind of antibiotics, Zithromax you will take for the next 3 days and Augmentin  for next 4 days, please start from tomorrow as you already received today's dose. We also started you on low-dose losartan as your blood pressure remained elevated-please follow-up closely with your primary care provider for further assistance and dose titration. As we discussed you have a lung nodule close to your lining of lung which need surveillance. Please follow-up with a pulmonologist for further assistance. Please keep yourself well-hydrated and continue taking rest of your home medications as you are doing it before. Follow-up with your primary care provider for further assistance  Patient Self Care Activities:  Attend all scheduled provider appointments Call pharmacy for medication refills 3-7 days in advance of running out of medications Call provider office for new concerns or questions  Notify RN Care Manager of [facility] call rescheduling needs Participate in Transition of Care Program/Attend [facility] scheduled  calls Perform all self care activities independently  Take medications as prescribed    Plan:  Telephone follow up appointment with care management team member scheduled for:  Wednesday November 19th at 11:30am        The patient verbalized understanding of instructions, educational materials, and care plan provided today and agreed to receive a mailed copy of patient instructions, educational materials, and care plan.   The patient has been provided with contact information for the care management team and has been advised to call with any health related questions or concerns.   Please call the care guide team at [phone] if you need to cancel or reschedule your appointment.   Please call the Suicide and Crisis Lifeline: 988 call the [place]  National Suicide Prevention Lifeline: 442-362-5699 or TTY: 740 831 7767 TTY 438-516-4704) to talk to a trained counselor if you are experiencing a Mental Health or Behavioral Health Crisis or need someone to talk to.  Medford Balboa, BSN, RN [place]  [facility] - [employer] [facility] RN Care Manager [phone]

## 2024-03-31 NOTE — Transitions of Care (Post Inpatient/ED Visit) (Signed)
 03/31/2024  Name: Vanessa Reynolds MRN: [id number] DOB: [birth year]  Today's TOC FU Call Status: Today's TOC FU Call Status:: Successful TOC FU Call Completed TOC FU Call Complete Date: 03/31/24  Patient's Name and Date of Birth confirmed. Name, DOB  Transition Care Management Follow-up Telephone Call Date of Discharge: 03/30/24 Discharge Facility: [facility] Type of Discharge: Inpatient Admission Primary Inpatient Discharge Diagnosis:: Sepsis due to UTI How have you been since you were released from the hospital?: Better Any questions or concerns?: No  Items Reviewed: Did you receive and understand the discharge instructions provided?: Yes Medications obtained,verified, and reconciled?: Yes (Medications Reviewed) Any new allergies since your discharge?: No Dietary orders reviewed?: Yes Type of Diet Ordered:: low sodium heart healthy Do you have support at home?: Yes People in Home [RPT]: grandchild(ren), spouse Name of Support/Comfort Primary Source: Charlie Sink  Medications Reviewed Today: Medications Reviewed Today     Reviewed by Moises Reusing, RN (Case Manager) on 03/31/24 at 1138  Med List Status: <None>   Medication Order Taking? Sig Documenting Provider Last Dose Status Informant  amoxicillin -clavulanate (AUGMENTIN ) 875-125 MG tablet [id number]  Take 1 tablet by mouth 2 (two) times daily for 4 days. Start from tomorrow Amin, Sumayya, MD  Active   azithromycin (ZITHROMAX) 500 MG tablet [id number]  Take 1 tablet (500 mg total) by mouth daily for 3 days. Amin, Sumayya, MD  Active   dextromethorphan-guaiFENesin [facility] DM) 30-600 MG 12hr tablet [id number]  Take 1 tablet by mouth 2 (two) times daily as needed for cough. Amin, Sumayya, MD  Active   fluticasone  (FLONASE ) 50 MCG/ACT nasal spray [id number]  PLACE 2 SPRAYS INTO BOTH NOSTRILS DAILY.USE FOR 4-6 WEEKS THEN STOP AND USE SEASONALLY OR AS NEEDED  Patient not taking: Reported on  03/31/2024   Edman Marsa PARAS, DO  Active Self, Spouse/Significant Other           Med Note BEVERLEE, JOYCE   Fri Jul 25, 2023  6:18 AM) prn  loratadine  (CLARITIN ) 10 MG tablet [id number]  Take 1 tablet (10 mg total) by mouth daily. Use for 4-6 weeks then stop, and use as needed or seasonally  Patient not taking: Reported on 03/31/2024   Edman Marsa PARAS, DO  Active Self, Spouse/Significant Other           Med Note BEVERLEE, JOYCE   Fri Jul 25, 2023  6:16 AM) prn  losartan (COZAAR) 25 MG tablet [id number]  Take 1 tablet (25 mg total) by mouth daily. Caleen Qualia, MD  Active   lovastatin  (MEVACOR ) 20 MG tablet [id number]  TAKE 1 TABLET BY MOUTH AT BEDTIME Edman Marsa PARAS, DO  Active Self, Spouse/Significant Other  omeprazole  (PRILOSEC) 40 MG capsule [id number]  TAKE 1 CAPSULE BY MOUTH ONCE DAILY BEFORE BREAKFAST Karamalegos, Marsa PARAS, DO  Active Self, Spouse/Significant Other  ondansetron  (ZOFRAN -ODT) 8 MG disintegrating tablet [id number] Yes Take 1 tablet (8 mg total) by mouth 2 (two) times daily.  Patient taking differently: Take 8 mg by mouth 2 (two) times daily as needed.   Edman Marsa PARAS, DO  Active Self, Spouse/Significant Other  sucralfate  (CARAFATE ) 1 g tablet [id number]  Take 1 tablet (1 g total) by mouth 4 (four) times daily -  with meals and at bedtime. As needed for indigestion, abdominal pain  Patient not taking: Reported on 03/28/2024   Edman Marsa PARAS, DO  Active Self, Spouse/Significant Other  vitamin B-12 (CYANOCOBALAMIN ) 100 MCG tablet [id number]  Take 100 mcg by mouth daily.  [provider]  Active Self, Spouse/Significant Other            Home Care and Equipment/Supplies: Were Home Health Services Ordered?: Yes Name of Home Health Agency:: [facility] Has Agency set up a time to come to your home?: Yes First Home Health Visit Date: 04/01/24 Any new equipment or medical supplies ordered?: Yes Name of Medical supply agency?:  [facility] Were you able to get the equipment/medical supplies?: Yes Do you have any questions related to the use of the equipment/supplies?: No  Functional Questionnaire: Do you need assistance with bathing/showering or dressing?: No Do you need assistance with meal preparation?: No Do you need assistance with eating?: No Do you have difficulty maintaining continence: No Do you need assistance with getting out of bed/getting out of a chair/moving?: No Do you have difficulty managing or taking your medications?: Yes (Her granddaughter assists wtih meds)  Follow up appointments reviewed: PCP Follow-up appointment confirmed?: Yes Date of PCP follow-up appointment?: 04/01/24 Follow-up Provider: Dr. Edman Specialist [facility] Follow-up appointment confirmed?: NA Do you need transportation to your follow-up appointment?: No Do you understand care options if your condition(s) worsen?: Yes-patient verbalized understanding  SDOH Interventions Today    Flowsheet Row Most Recent Value  SDOH Interventions   Food Insecurity Interventions Intervention Not Indicated  Housing Interventions Intervention Not Indicated  Transportation Interventions Intervention Not Indicated  Utilities Interventions Intervention Not Indicated    Goals Addressed             This Visit's Progress    VBCI Transitions of Care (TOC) Care Plan       Problems:  Recent Hospitalization for treatment of Sepsis due to UTI  Goal:  Over the next 30 days, the patient will not experience hospital readmission  Interventions:   Evaluation of current treatment plan related to Sepsis with UTI, self-management and patient's adherence to plan as established by provider. Discussed plans with patient for ongoing care management follow up and provided patient with direct contact information for care management team You have been given 2 different kind of antibiotics, Zithromax you will take for the next 3 days and  Augmentin  for next 4 days, please start from tomorrow as you already received today's dose. We also started you on low-dose losartan as your blood pressure remained elevated-please follow-up closely with your primary care provider for further assistance and dose titration. As we discussed you have a lung nodule close to your lining of lung which need surveillance. Please follow-up with a pulmonologist for further assistance. Please keep yourself well-hydrated and continue taking rest of your home medications as you are doing it before. Follow-up with your primary care provider for further assistance  Patient Self Care Activities:  Attend all scheduled provider appointments Call pharmacy for medication refills 3-7 days in advance of running out of medications Call provider office for new concerns or questions  Notify RN Care Manager of [facility] call rescheduling needs Participate in Transition of Care Program/Attend [facility] scheduled calls Perform all self care activities independently  Take medications as prescribed    Plan:  Telephone follow up appointment with care management team member scheduled for:  Wednesday November 19th at 11:30am        Medford Balboa, BSN, RN [place]  VBCI - Population Health RN Care Manager [phone]

## 2024-04-01 ENCOUNTER — Encounter: Payer: Self-pay | Admitting: Family Medicine

## 2024-04-01 ENCOUNTER — Ambulatory Visit (INDEPENDENT_AMBULATORY_CARE_PROVIDER_SITE_OTHER): Admitting: Family Medicine

## 2024-04-01 VITALS — BP 138/86 | HR 67 | Ht 62.0 in | Wt 164.5 lb

## 2024-04-01 DIAGNOSIS — Z0000 Encounter for general adult medical examination without abnormal findings: Secondary | ICD-10-CM | POA: Diagnosis not present

## 2024-04-01 DIAGNOSIS — I10 Essential (primary) hypertension: Secondary | ICD-10-CM

## 2024-04-01 DIAGNOSIS — E7849 Other hyperlipidemia: Secondary | ICD-10-CM

## 2024-04-01 DIAGNOSIS — E538 Deficiency of other specified B group vitamins: Secondary | ICD-10-CM

## 2024-04-01 DIAGNOSIS — I700 Atherosclerosis of aorta: Secondary | ICD-10-CM

## 2024-04-01 DIAGNOSIS — E876 Hypokalemia: Secondary | ICD-10-CM

## 2024-04-01 DIAGNOSIS — E782 Mixed hyperlipidemia: Secondary | ICD-10-CM

## 2024-04-01 DIAGNOSIS — Z8619 Personal history of other infectious and parasitic diseases: Secondary | ICD-10-CM

## 2024-04-01 DIAGNOSIS — R110 Nausea: Secondary | ICD-10-CM

## 2024-04-01 DIAGNOSIS — E559 Vitamin D deficiency, unspecified: Secondary | ICD-10-CM

## 2024-04-01 DIAGNOSIS — K7460 Unspecified cirrhosis of liver: Secondary | ICD-10-CM

## 2024-04-01 DIAGNOSIS — J432 Centrilobular emphysema: Secondary | ICD-10-CM

## 2024-04-01 MED ORDER — LOVASTATIN 20 MG PO TABS
20.0000 mg | ORAL_TABLET | Freq: Every day | ORAL | 1 refills | Status: AC
Start: 2024-04-01 — End: ?

## 2024-04-01 MED ORDER — POTASSIUM CHLORIDE CRYS ER 20 MEQ PO TBCR: 20.0000 meq | EXTENDED_RELEASE_TABLET | Freq: Every day | ORAL | 0 refills | Status: DC

## 2024-04-01 MED ORDER — ONDANSETRON 8 MG PO TBDP: 8.0000 mg | ORAL_TABLET | Freq: Two times a day (BID) | ORAL | 2 refills | Status: AC | PRN

## 2024-04-01 MED ORDER — LOSARTAN POTASSIUM 25 MG PO TABS: 25.0000 mg | ORAL_TABLET | Freq: Every day | ORAL | 1 refills | Status: AC

## 2024-04-01 NOTE — Patient Instructions (Addendum)
 Thank you for coming to the office today.  Ask the Lung Doctor on 12/8 about the repeat CT Scan on the Lung.  Refilled the new BP medication Losartan 25mg  daily  Add Potassium supplement rx 1 a day for 7 days, then stop.  Labs before the hospital looked good  Kidney function is back on track.  Refilled nausea med as needed.   Please schedule a Follow-up Appointment to: Return in about 6 months (around 09/29/2024) for 6 month follow-up HTN, specialist follow-up.  If you have any other questions or concerns, please feel free to call the office or send a message through MyChart. You may also schedule an earlier appointment if necessary.  Additionally, you may be receiving a survey about your experience at our office within a few days to 1 week by e-mail or mail. We value your feedback.  Marsa Officer, DO [facility], [place]

## 2024-04-01 NOTE — Progress Notes (Signed)
 Subjective:    Patient ID: Vanessa Reynolds, female    DOB: 1946/03/22, 78 y.o.   MRN: [id number]  Vanessa Reynolds is a 78 y.o. female presenting on 04/01/2024 for Medical Management of Chronic Issues   HPI  Discussed the use of AI scribe software for clinical note transcription with the patient, who gave verbal consent to proceed.  History of Present Illness   Vanessa Reynolds is a 78 year old female who presents for an annual physical exam and follow-up after recent hospitalization for sepsis.  Recent hospitalization and infectious symptoms - Hospitalized from November 8th to November 11th for sepsis, treated with antibiotics - Currently completing a seven-day course of Augmentin  - Breathing has improved slightly since hospitalization, but oxygen saturation continues to dip with activity - CT scan during hospitalization revealed a pleural effusion, treated as pneumonia - Lung nodule or scar tissue identified; follow-up imaging recommended in three months - Experienced a fall and difficulty breathing prior to hospitalization - Took Zofran  for nausea following the fall  Respiratory symptoms and pulmonary findings - History of emphysema - Persistent exertional desaturation despite improvement in breathing - Pleural effusion and lung nodule noted on recent CT scan - No current chest pain, shortness of breath, or palpitations reported  Encephalopathy - improving - Confusion and memory impairment present since recent infection - Family attributes mental status changes to recent illness - Brain imaging during hospitalization did not reveal acute abnormalities  Hepatic findings - Mild cirrhosis of the liver - No current treatment for liver condition  Laboratory abnormalities and metabolic status - Recent blood work showed slightly low potassium level - Iron levels stable - Kidney function, cholesterol, and thyroid  studies within normal limits - Recent dehydration likely secondary to  acute illness      CHRONIC HYPERTENSION BP med changed in hospital Now on Losartan 25mg  daily Off Lisinopril    Reports good compliance, took meds today. Tolerating well, w/o complaints. Denies CP, dyspnea, HA, edema / lightheadedness    Meningioma Followed by Neurosurgery Dr Clois for Meningioma surveillance. Now no recent follow-up. On surveillance if she has problem.   HYPERLIPIDEMIA: - Reports no concerns. Last lipid panel 2025, LDL 125 - Currently taking Lovastatin  20mg  daily, tolerating well without side effects or myalgias   Atherosclerosis Aorta On imaging CT 2021 On statin therapy   GERD Improved on Omeprazole  40mg    Vitamin D  Lab controlled in range previously On Supplement   Vitamin B12 Deficiency Continues on injection therapy   UTD PNEUMONIA vaccine   Future Shingles vaccine when ready.   Colonoscopy updated 12/2019, next due 5 years, 2026.      04/01/2024   11:14 AM 08/20/2023    1:20 PM 03/19/2023    9:18 AM  Depression screen PHQ 2/9  Decreased Interest 2 2 0  Down, Depressed, Hopeless 0 0 0  PHQ - 2 Score 2 2 0  Altered sleeping 0 1 1  Tired, decreased energy 1 3 0  Change in appetite 1 3 0  Feeling bad or failure about yourself  0 2 0  Trouble concentrating 1  0  Moving slowly or fidgety/restless 1 0 0  Suicidal thoughts 0 0 0  PHQ-9 Score 6 11  1    Difficult doing work/chores Somewhat difficult Not difficult at all Not difficult at all     Data saved with a previous flowsheet row definition       04/01/2024   11:15 AM 08/20/2023    1:20  PM 03/19/2023    9:19 AM 09/09/2022    1:13 PM  GAD 7 : Generalized Anxiety Score  Nervous, Anxious, on Edge 0 0 0 0  Control/stop worrying 0 0 0 1  Worry too much - different things 0 0 1 1  Trouble relaxing 0 0 0 1  Restless 0 0 1 0  Easily annoyed or irritable 0 0 1 0  Afraid - awful might happen 0 0 1 0  Total GAD 7 Score 0 0 4 3  Anxiety Difficulty Not difficult at all Not difficult at  all       Past Medical History:  Diagnosis Date   Abdominal pain, left lower quadrant 2012   Allergy    Breast screening, unspecified    H/O cystitis 2011   History of colon cancer    Hyperlipidemia    Hypertension    Nausea with vomiting    Personal history of malignant neoplasm of large intestine 2013   Personal history of tobacco use, presenting hazards to health    Rectal cancer (HCC) 04/2010   Adenocarcinoma arising in a villous adenoma, PT 1, N0.   Rectal cancer [facility])    Past Surgical History:  Procedure Laterality Date   APPENDECTOMY  December 2011   BILATERAL OOPHORECTOMY  December 2011   Completed at time of low anterior resection.   COLON SURGERY  03/14/10   lap assisted low anterior resection-adenocarcinoma of the rectum and a villous adenoma   COLONOSCOPY  2011,2012,2015   Dr. Viktoria, Dr. Margarete)   COLONOSCOPY WITH PROPOFOL  N/A 01/05/2020   Procedure: COLONOSCOPY WITH PROPOFOL ;  Surgeon: Dessa Reyes ORN, MD;  Location: [facility] ENDOSCOPY;  Service: Endoscopy;  Laterality: N/A;   Social History   Socioeconomic History   Marital status: Married    Spouse name: Rubie Ficco   Number of children: 3   Years of education: Not on file   Highest education level: 10th grade  Occupational History    Comment: fulltime    Occupation: Retired  Tobacco Use   Smoking status: Former    Current packs/day: 0.00    Average packs/day: 2.0 packs/day for 30.0 years (60.0 ttl pk-yrs)    Types: Cigarettes    Start date: 05/21/1967    Quit date: 05/20/1997    Years since quitting: 26.8   Smokeless tobacco: Former  Building Services Engineer status: Never Used  Substance and Sexual Activity   Alcohol use: No   Drug use: No   Sexual activity: Not on file  Other Topics Concern   Not on file  Social History Narrative   Not on file   Social Drivers of Health   Financial Resource Strain: Low Risk  (08/27/2021)   Overall Financial Resource Strain (CARDIA)    Difficulty of  Paying Living Expenses: Not hard at all  Food Insecurity: No Food Insecurity (03/31/2024)   Hunger Vital Sign    Worried About Running Out of Food in the Last Year: Never true    Ran Out of Food in the Last Year: Never true  Transportation Needs: No Transportation Needs (03/31/2024)   PRAPARE - Administrator, Civil Service (Medical): No    Lack of Transportation (Non-Medical): No  Physical Activity: Inactive (08/27/2021)   Exercise Vital Sign    Days of Exercise per Week: 0 days    Minutes of Exercise per Session: 0 min  Stress: No Stress Concern Present (08/27/2021)   [employer] of Occupational Health -  Occupational Stress Questionnaire    Feeling of Stress : Not at all  Social Connections: Moderately Integrated (03/28/2024)   Social Connection and Isolation Panel    Frequency of Communication with Friends and Family: Three times a week    Frequency of Social Gatherings with Friends and Family: Three times a week    Attends Religious Services: Patient unable to answer    Active Member of Clubs or Organizations: Yes    Attends Club or Organization Meetings: Patient unable to answer    Marital Status: Married  Intimate Partner Violence: Not At Risk (03/31/2024)   Humiliation, Afraid, Rape, and Kick questionnaire    Fear of Current or Ex-Partner: No    Emotionally Abused: No    Physically Abused: No    Sexually Abused: No   Family History  Problem Relation Age of Onset   Hypertension Mother        deceased, age 77   Cancer Father        gastric cancer, deceased, mid 27's   Breast cancer Neg Hx    Current Outpatient Medications on File Prior to Visit  Medication Sig   amoxicillin -clavulanate (AUGMENTIN ) 875-125 MG tablet Take 1 tablet by mouth 2 (two) times daily for 4 days. Start from tomorrow   azithromycin (ZITHROMAX) 500 MG tablet Take 1 tablet (500 mg total) by mouth daily for 3 days.   omeprazole  (PRILOSEC) 40 MG capsule TAKE 1 CAPSULE BY MOUTH ONCE DAILY  BEFORE BREAKFAST   vitamin B-12 (CYANOCOBALAMIN ) 100 MCG tablet Take 100 mcg by mouth daily.   fluticasone  (FLONASE ) 50 MCG/ACT nasal spray PLACE 2 SPRAYS INTO BOTH NOSTRILS DAILY.USE FOR 4-6 WEEKS THEN STOP AND USE SEASONALLY OR AS NEEDED (Patient not taking: Reported on 04/01/2024)   loratadine  (CLARITIN ) 10 MG tablet Take 1 tablet (10 mg total) by mouth daily. Use for 4-6 weeks then stop, and use as needed or seasonally (Patient not taking: Reported on 04/01/2024)   sucralfate  (CARAFATE ) 1 g tablet Take 1 tablet (1 g total) by mouth 4 (four) times daily -  with meals and at bedtime. As needed for indigestion, abdominal pain (Patient not taking: Reported on 04/01/2024)   No current facility-administered medications on file prior to visit.    Review of Systems  Constitutional:  Negative for activity change, appetite change, chills, diaphoresis, fatigue and fever.  HENT:  Negative for congestion and hearing loss.   Eyes:  Negative for visual disturbance.  Respiratory:  Negative for cough, chest tightness, shortness of breath and wheezing.   Cardiovascular:  Negative for chest pain, palpitations and leg swelling.  Gastrointestinal:  Negative for abdominal pain, constipation, diarrhea, nausea and vomiting.  Genitourinary:  Negative for dysuria, frequency and hematuria.  Musculoskeletal:  Negative for arthralgias and neck pain.  Skin:  Negative for rash.  Neurological:  Negative for dizziness, weakness, light-headedness, numbness and headaches.  Hematological:  Negative for adenopathy.  Psychiatric/Behavioral:  Negative for behavioral problems, dysphoric mood and sleep disturbance.    Per HPI unless specifically indicated above     Objective:    BP 138/86 (BP Location: Left Arm, Cuff Size: Normal)   Pulse 67   Ht 5' 2 (1.575 m)   Wt 164 lb 8 oz (74.6 kg)   SpO2 98%   BMI 30.09 kg/m   Wt Readings from Last 3 Encounters:  04/01/24 164 lb 8 oz (74.6 kg)  03/31/24 179 lb (81.2 kg)   03/28/24 179 lb 6.4 oz (81.4 kg)    Physical  Exam Vitals and nursing note reviewed.  Constitutional:      General: She is not in acute distress.    Appearance: She is well-developed. She is not diaphoretic.     Comments: Well-appearing, comfortable, cooperative  HENT:     Head: Normocephalic and atraumatic.  Eyes:     General:        Right eye: No discharge.        Left eye: No discharge.     Conjunctiva/sclera: Conjunctivae normal.     Pupils: Pupils are equal, round, and reactive to light.  Neck:     Thyroid : No thyromegaly.     Vascular: No carotid bruit.  Cardiovascular:     Rate and Rhythm: Normal rate and regular rhythm.     Pulses: Normal pulses.     Heart sounds: Normal heart sounds. No murmur heard. Pulmonary:     Effort: Pulmonary effort is normal. No respiratory distress.     Breath sounds: Normal breath sounds. No wheezing or rales.  Abdominal:     General: Bowel sounds are normal. There is no distension.     Palpations: Abdomen is soft. There is no mass.     Tenderness: There is no abdominal tenderness.  Musculoskeletal:        General: No tenderness. Normal range of motion.     Cervical back: Normal range of motion and neck supple.     Right lower leg: No edema.     Left lower leg: No edema.     Comments: Upper / Lower Extremities: - Normal muscle tone, strength bilateral upper extremities 5/5, lower extremities 5/5  Lymphadenopathy:     Cervical: No cervical adenopathy.  Skin:    General: Skin is warm and dry.     Findings: No erythema or rash.  Neurological:     Mental Status: She is alert and oriented to person, place, and time.     Comments: Distal sensation intact to light touch all extremities  Psychiatric:        Mood and Affect: Mood normal.        Behavior: Behavior normal.        Thought Content: Thought content normal.     Comments: Well groomed, good eye contact, normal speech and thoughts     I have personally reviewed the radiology  report from 03/28/24 on CT Head.  EXAM: CT HEAD WITHOUT CONTRAST 03/28/2024 01:16:26 AM   TECHNIQUE: CT of the head was performed without the administration of intravenous contrast. Automated exposure control, iterative reconstruction, and/or weight based adjustment of the mA/kV was utilized to reduce the radiation dose to as low as reasonably achievable.   COMPARISON: 07/25/2023   CLINICAL HISTORY: Mental status change, unknown cause.   FINDINGS:   BRAIN AND VENTRICLES: No acute hemorrhage. No evidence of acute infarct. No hydrocephalus. No extra-axial collection. No mass effect or midline shift. Stable right parafalcine calcified meningioma. Subcortical and periventricular small vessel ischemic changes.   ORBITS: No acute abnormality.   SINUSES: Partial opacification of the right maxillary sinus.   SOFT TISSUES AND SKULL: No acute soft tissue abnormality. No skull fracture.   IMPRESSION: 1. No acute intracranial abnormality.   Electronically signed by: Pinkie Pebbles MD 03/28/2024 01:19 AM EST RP Workstation: [id number]    ------  I have personally reviewed the radiology report from 03/29/24 CTA Chest  CLINICAL DATA:  Concern for pulmonary embolism.   EXAM: CT ANGIOGRAPHY CHEST WITH CONTRAST   TECHNIQUE: Multidetector CT imaging of  the chest was performed using the standard protocol during bolus administration of intravenous contrast. Multiplanar CT image reconstructions and MIPs were obtained to evaluate the vascular anatomy.   RADIATION DOSE REDUCTION: This exam was performed according to the departmental dose-optimization program which includes automated exposure control, adjustment of the mA and/or kV according to patient size and/or use of iterative reconstruction technique.   CONTRAST:  75mL OMNIPAQUE  IOHEXOL  350 MG/ML SOLN   COMPARISON:  Chest CT dated 07/25/2023.   FINDINGS: Evaluation of this exam is limited due to respiratory motion.    Cardiovascular: Borderline cardiomegaly. No pericardial effusion. Mild atherosclerotic calcification of the thoracic aorta. No aneurysmal dilatation. There is mild dilatation of the main pulmonary trunk suggestive of pulmonary hypertension. Evaluation of the pulmonary arteries is limited due to respiratory motion. No pulmonary artery embolus identified.   Mediastinum/Nodes: Right hilar adenopathy measures 14 mm short axis. The esophagus is grossly unremarkable. No mediastinal fluid collection.   Lungs/Pleura: Small bilateral pleural effusions. Bibasilar subpleural subsegmental consolidative changes may represent atelectasis or pneumonia. There is background of centrilobular emphysema. No pneumothorax. There is a 9 x 17 mm subpleural ground-glass nodule or scarring in the left upper lobe, present on the prior CT. Attention on follow-up imaging recommended. The central airways are patent.   Upper Abdomen: Cirrhosis.   Musculoskeletal: Osteopenia with degenerative changes. No acute osseous pathology.   Review of the MIP images confirms the above findings.   IMPRESSION: 1. No CT evidence of pulmonary embolism. 2. Small bilateral pleural effusions with bibasilar subpleural subsegmental consolidative changes may represent atelectasis or pneumonia. 3. A 9 x 17 mm subpleural ground-glass nodule or scarring in the left upper lobe, present on the prior CT. Attention on follow-up imaging recommended. 4. Cirrhosis. 5. Aortic Atherosclerosis (ICD10-I70.0) and Emphysema (ICD10-J43.9).     Electronically Signed   By: Vanetta Chou M.D.   On: 03/29/2024 14:53  Results for orders placed or performed during the hospital encounter of 03/27/24  Resp panel by RT-PCR (RSV, Flu A&B, Covid) Anterior Nasal Swab   Collection Time: 03/28/24 12:12 AM   Specimen: Anterior Nasal Swab  Result Value Ref Range   SARS Coronavirus 2 by RT PCR NEGATIVE NEGATIVE   Influenza A by PCR NEGATIVE NEGATIVE    Influenza B by PCR NEGATIVE NEGATIVE   Resp Syncytial Virus by PCR NEGATIVE NEGATIVE  Blood Culture (routine x 2)   Collection Time: 03/28/24 12:12 AM   Specimen: BLOOD  Result Value Ref Range   Specimen Description      BLOOD RIGHT ANTECUBITAL Performed at [facility], [address], [place], [place] [address]    Special Requests      BOTTLES DRAWN AEROBIC AND ANAEROBIC Blood Culture adequate volume Performed at [facility], [address] [place], [place] [address]    Culture  Setup Time      GRAM NEGATIVE RODS AEROBIC BOTTLE ONLY CRITICAL RESULT CALLED TO, READ BACK BY AND VERIFIED WITH: RODNEY GRUBB @1254  03/28/24 MJU Performed at [facility], [address] [place], [place] [address]    Culture (A)     ESCHERICHIA COLI SUSCEPTIBILITIES PERFORMED ON PREVIOUS CULTURE WITHIN THE LAST 5 DAYS. Performed at [facility], [address], [place], [place] [address]    Report Status 03/30/2024 FINAL   Lactic acid, plasma   Collection Time: 03/28/24 12:12 AM  Result Value Ref Range   Lactic Acid, Venous 1.0 0.5 - 1.9 mmol/L  Comprehensive metabolic panel  Collection Time: 03/28/24 12:12 AM  Result Value Ref Range   Sodium 137 135 - 145 mmol/L   Potassium 3.4 (L) 3.5 - 5.1 mmol/L   Chloride 102 98 - 111 mmol/L   CO2 23 22 - 32 mmol/L   Glucose, Bld 124 (H) 70 - 99 mg/dL   BUN 18 8 - 23 mg/dL   Creatinine, Ser 8.99 0.44 - 1.00 mg/dL   Calcium 8.7 (L) 8.9 - 10.3 mg/dL   Total Protein 7.9 6.5 - 8.1 g/dL   Albumin 3.2 (L) 3.5 - 5.0 g/dL   AST 37 15 - 41 U/L   ALT 25 0 - 44 U/L   Alkaline Phosphatase 316 (H) 38 - 126 U/L   Total Bilirubin 1.0 0.0 - 1.2 mg/dL   GFR, Estimated 57 (L) >60 mL/min   Anion gap 12 5 - 15  CBC with Differential   Collection Time: 03/28/24 12:12 AM  Result Value Ref Range   WBC 10.5 4.0 - 10.5 K/uL   RBC 3.99 3.87 - 5.11 MIL/uL   Hemoglobin 11.7 (L) 12.0 - 15.0 g/dL   HCT 64.0 (L) 63.9 - 53.9 %   MCV  90.0 80.0 - 100.0 fL   MCH 29.3 26.0 - 34.0 pg   MCHC 32.6 30.0 - 36.0 g/dL   RDW 86.9 88.4 - 84.4 %   Platelets 124 (L) 150 - 400 K/uL   nRBC 0.0 0.0 - 0.2 %   Neutrophils Relative % 91 %   Neutro Abs 9.6 (H) 1.7 - 7.7 K/uL   Lymphocytes Relative 5 %   Lymphs Abs 0.5 (L) 0.7 - 4.0 K/uL   Monocytes Relative 3 %   Monocytes Absolute 0.3 0.1 - 1.0 K/uL   Eosinophils Relative 0 %   Eosinophils Absolute 0.0 0.0 - 0.5 K/uL   Basophils Relative 0 %   Basophils Absolute 0.0 0.0 - 0.1 K/uL   Immature Granulocytes 1 %   Abs Immature Granulocytes 0.05 0.00 - 0.07 K/uL  Protime-INR   Collection Time: 03/28/24 12:12 AM  Result Value Ref Range   Prothrombin Time 14.3 11.4 - 15.2 seconds   INR 1.1 0.8 - 1.2  Troponin I (High Sensitivity)   Collection Time: 03/28/24 12:12 AM  Result Value Ref Range   Troponin I (High Sensitivity) 19 (H) <18 ng/L  Blood Culture (routine x 2)   Collection Time: 03/28/24 12:17 AM   Specimen: BLOOD  Result Value Ref Range   Specimen Description      BLOOD LEFT ANTECUBITAL Performed at [facility], [address] [place], [place] [address]    Special Requests      BOTTLES DRAWN AEROBIC AND ANAEROBIC Blood Culture results may not be optimal due to an inadequate volume of blood received in culture bottles Performed at [facility], [address] [place], [place] [address]    Culture  Setup Time      GRAM NEGATIVE RODS AEROBIC BOTTLE ONLY CRITICAL RESULT CALLED TO, READ BACK BY AND VERIFIED WITH: RODNEY GRUBB @1254  03/28/24 MJU Performed at [facility], [address], [place], [place] [address]    Culture ESCHERICHIA COLI (A)    Report Status 03/30/2024 FINAL    Organism ID, Bacteria ESCHERICHIA COLI       Susceptibility   Escherichia coli - MIC*    AMPICILLIN 4 SENSITIVE Sensitive     CEFAZOLIN (NON-URINE) <=1 SENSITIVE Sensitive     CEFEPIME <=0.12 SENSITIVE Sensitive     ERTAPENEM <=  0.12 SENSITIVE Sensitive      CEFTRIAXONE  <=0.25 SENSITIVE Sensitive     CIPROFLOXACIN  <=0.06 SENSITIVE Sensitive     GENTAMICIN <=1 SENSITIVE Sensitive     MEROPENEM <=0.25 SENSITIVE Sensitive     TRIMETH/SULFA <=20 SENSITIVE Sensitive     AMPICILLIN/SULBACTAM <=2 SENSITIVE Sensitive     PIP/TAZO Value in next row Sensitive      <=4 SENSITIVEThis is a modified FDA-approved test that has been validated and its performance characteristics determined by the reporting laboratory.  This laboratory is certified under the Clinical Laboratory Improvement Amendments CLIA as qualified to perform high complexity clinical laboratory testing.    * ESCHERICHIA COLI  Urine Culture   Collection Time: 03/28/24 12:17 AM   Specimen: Urine, Random  Result Value Ref Range   Specimen Description      URINE, RANDOM Performed at [facility], [address] [place], [place] [address]    Special Requests      NONE Reflexed from [phone] Performed at [facility], [address] [place], [place] [address]    Culture 10,000 COLONIES/mL ESCHERICHIA COLI (A)    Report Status 03/30/2024 FINAL    Organism ID, Bacteria ESCHERICHIA COLI (A)       Susceptibility   Escherichia coli - MIC*    AMPICILLIN 8 SENSITIVE Sensitive     CEFAZOLIN (URINE) Value in next row Sensitive      2 SENSITIVEThis is a modified FDA-approved test that has been validated and its performance characteristics determined by the reporting laboratory.  This laboratory is certified under the Clinical Laboratory Improvement Amendments CLIA as qualified to perform high complexity clinical laboratory testing.    CEFEPIME Value in next row Sensitive      2 SENSITIVEThis is a modified FDA-approved test that has been validated and its performance characteristics determined by the reporting laboratory.  This laboratory is certified under the Clinical Laboratory Improvement Amendments CLIA as qualified to perform high complexity clinical laboratory testing.     ERTAPENEM Value in next row Sensitive      2 SENSITIVEThis is a modified FDA-approved test that has been validated and its performance characteristics determined by the reporting laboratory.  This laboratory is certified under the Clinical Laboratory Improvement Amendments CLIA as qualified to perform high complexity clinical laboratory testing.    CEFTRIAXONE  Value in next row Sensitive      2 SENSITIVEThis is a modified FDA-approved test that has been validated and its performance characteristics determined by the reporting laboratory.  This laboratory is certified under the Clinical Laboratory Improvement Amendments CLIA as qualified to perform high complexity clinical laboratory testing.    CIPROFLOXACIN  Value in next row Sensitive      2 SENSITIVEThis is a modified FDA-approved test that has been validated and its performance characteristics determined by the reporting laboratory.  This laboratory is certified under the Clinical Laboratory Improvement Amendments CLIA as qualified to perform high complexity clinical laboratory testing.    GENTAMICIN Value in next row Sensitive      2 SENSITIVEThis is a modified FDA-approved test that has been validated and its performance characteristics determined by the reporting laboratory.  This laboratory is certified under the Clinical Laboratory Improvement Amendments CLIA as qualified to perform high complexity clinical laboratory testing.    NITROFURANTOIN Value in next row Sensitive      2 SENSITIVEThis is a modified FDA-approved test that has been validated and its performance characteristics determined by the reporting laboratory.  This laboratory is certified  under the Clinical Laboratory Improvement Amendments CLIA as qualified to perform high complexity clinical laboratory testing.    TRIMETH/SULFA Value in next row Sensitive      2 SENSITIVEThis is a modified FDA-approved test that has been validated and its performance characteristics determined by  the reporting laboratory.  This laboratory is certified under the Clinical Laboratory Improvement Amendments CLIA as qualified to perform high complexity clinical laboratory testing.    AMPICILLIN/SULBACTAM Value in next row Sensitive      2 SENSITIVEThis is a modified FDA-approved test that has been validated and its performance characteristics determined by the reporting laboratory.  This laboratory is certified under the Clinical Laboratory Improvement Amendments CLIA as qualified to perform high complexity clinical laboratory testing.    PIP/TAZO Value in next row Sensitive      <=4 SENSITIVEThis is a modified FDA-approved test that has been validated and its performance characteristics determined by the reporting laboratory.  This laboratory is certified under the Clinical Laboratory Improvement Amendments CLIA as qualified to perform high complexity clinical laboratory testing.    MEROPENEM Value in next row Sensitive      <=4 SENSITIVEThis is a modified FDA-approved test that has been validated and its performance characteristics determined by the reporting laboratory.  This laboratory is certified under the Clinical Laboratory Improvement Amendments CLIA as qualified to perform high complexity clinical laboratory testing.    * 10,000 COLONIES/mL ESCHERICHIA COLI  Blood Culture ID Panel (Reflexed)   Collection Time: 03/28/24 12:17 AM  Result Value Ref Range   Enterococcus faecalis NOT DETECTED NOT DETECTED   Enterococcus Faecium NOT DETECTED NOT DETECTED   Listeria monocytogenes NOT DETECTED NOT DETECTED   Staphylococcus species NOT DETECTED NOT DETECTED   Staphylococcus aureus (BCID) NOT DETECTED NOT DETECTED   Staphylococcus epidermidis NOT DETECTED NOT DETECTED   Staphylococcus lugdunensis NOT DETECTED NOT DETECTED   Streptococcus species NOT DETECTED NOT DETECTED   Streptococcus agalactiae NOT DETECTED NOT DETECTED   Streptococcus pneumoniae NOT DETECTED NOT DETECTED   Streptococcus  pyogenes NOT DETECTED NOT DETECTED   A.calcoaceticus-baumannii NOT DETECTED NOT DETECTED   Bacteroides fragilis NOT DETECTED NOT DETECTED   Enterobacterales DETECTED (A) NOT DETECTED   Enterobacter cloacae complex NOT DETECTED NOT DETECTED   Escherichia coli DETECTED (A) NOT DETECTED   Klebsiella aerogenes NOT DETECTED NOT DETECTED   Klebsiella oxytoca NOT DETECTED NOT DETECTED   Klebsiella pneumoniae NOT DETECTED NOT DETECTED   Proteus species NOT DETECTED NOT DETECTED   Salmonella species NOT DETECTED NOT DETECTED   Serratia marcescens NOT DETECTED NOT DETECTED   Haemophilus influenzae NOT DETECTED NOT DETECTED   Neisseria meningitidis NOT DETECTED NOT DETECTED   Pseudomonas aeruginosa NOT DETECTED NOT DETECTED   Stenotrophomonas maltophilia NOT DETECTED NOT DETECTED   Candida albicans NOT DETECTED NOT DETECTED   Candida auris NOT DETECTED NOT DETECTED   Candida glabrata NOT DETECTED NOT DETECTED   Candida krusei NOT DETECTED NOT DETECTED   Candida parapsilosis NOT DETECTED NOT DETECTED   Candida tropicalis NOT DETECTED NOT DETECTED   Cryptococcus neoformans/gattii NOT DETECTED NOT DETECTED   CTX-M ESBL NOT DETECTED NOT DETECTED   Carbapenem resistance IMP NOT DETECTED NOT DETECTED   Carbapenem resistance KPC NOT DETECTED NOT DETECTED   Carbapenem resistance NDM NOT DETECTED NOT DETECTED   Carbapenem resist OXA 48 LIKE NOT DETECTED NOT DETECTED   Carbapenem resistance VIM NOT DETECTED NOT DETECTED  Urinalysis, w/ Reflex to Culture (Infection Suspected) -Urine, Clean Catch   Collection Time: 03/28/24 12:17  AM  Result Value Ref Range   Specimen Source URINE, CLEAN CATCH    Color, Urine YELLOW (A) YELLOW   APPearance CLOUDY (A) CLEAR   Specific Gravity, Urine 1.012 1.005 - 1.030   pH 5.0 5.0 - 8.0   Glucose, UA NEGATIVE NEGATIVE mg/dL   Hgb urine dipstick SMALL (A) NEGATIVE   Bilirubin Urine NEGATIVE NEGATIVE   Ketones, ur NEGATIVE NEGATIVE mg/dL   Protein, ur NEGATIVE  NEGATIVE mg/dL   Nitrite POSITIVE (A) NEGATIVE   Leukocytes,Ua MODERATE (A) NEGATIVE   RBC / HPF 6-10 0 - 5 RBC/hpf   WBC, UA >50 0 - 5 WBC/hpf   Bacteria, UA NONE SEEN NONE SEEN   Squamous Epithelial / HPF 0 0 - 5 /HPF   Mucus PRESENT   Cortisol-am, blood   Collection Time: 03/28/24  4:39 AM  Result Value Ref Range   Cortisol - AM 22.0 6.7 - 22.6 ug/dL  Troponin I (High Sensitivity)   Collection Time: 03/28/24  4:39 AM  Result Value Ref Range   Troponin I (High Sensitivity) 36 (H) <18 ng/L  Lactic acid, plasma   Collection Time: 03/28/24  1:16 PM  Result Value Ref Range   Lactic Acid, Venous 1.9 0.5 - 1.9 mmol/L  CBC   Collection Time: 03/29/24  3:53 AM  Result Value Ref Range   WBC 7.2 4.0 - 10.5 K/uL   RBC 3.80 (L) 3.87 - 5.11 MIL/uL   Hemoglobin 11.2 (L) 12.0 - 15.0 g/dL   HCT 65.9 (L) 63.9 - 53.9 %   MCV 89.5 80.0 - 100.0 fL   MCH 29.5 26.0 - 34.0 pg   MCHC 32.9 30.0 - 36.0 g/dL   RDW 86.8 88.4 - 84.4 %   Platelets 110 (L) 150 - 400 K/uL   nRBC 0.0 0.0 - 0.2 %  Basic metabolic panel with GFR   Collection Time: 03/29/24  3:53 AM  Result Value Ref Range   Sodium 136 135 - 145 mmol/L   Potassium 3.4 (L) 3.5 - 5.1 mmol/L   Chloride 104 98 - 111 mmol/L   CO2 22 22 - 32 mmol/L   Glucose, Bld 98 70 - 99 mg/dL   BUN 12 8 - 23 mg/dL   Creatinine, Ser 9.35 0.44 - 1.00 mg/dL   Calcium 8.5 (L) 8.9 - 10.3 mg/dL   GFR, Estimated >39 >39 mL/min   Anion gap 10 5 - 15  CBC   Collection Time: 03/30/24  3:24 AM  Result Value Ref Range   WBC 5.8 4.0 - 10.5 K/uL   RBC 3.89 3.87 - 5.11 MIL/uL   Hemoglobin 11.4 (L) 12.0 - 15.0 g/dL   HCT 65.8 (L) 63.9 - 53.9 %   MCV 87.7 80.0 - 100.0 fL   MCH 29.3 26.0 - 34.0 pg   MCHC 33.4 30.0 - 36.0 g/dL   RDW 86.9 88.4 - 84.4 %   Platelets 121 (L) 150 - 400 K/uL   nRBC 0.0 0.0 - 0.2 %  Basic metabolic panel with GFR   Collection Time: 03/30/24  3:24 AM  Result Value Ref Range   Sodium 136 135 - 145 mmol/L   Potassium 3.2 (L) 3.5 - 5.1  mmol/L   Chloride 101 98 - 111 mmol/L   CO2 25 22 - 32 mmol/L   Glucose, Bld 81 70 - 99 mg/dL   BUN 11 8 - 23 mg/dL   Creatinine, Ser 9.33 0.44 - 1.00 mg/dL   Calcium 8.5 (L) 8.9 -  10.3 mg/dL   GFR, Estimated >39 >39 mL/min   Anion gap 10 5 - 15  Magnesium    Collection Time: 03/30/24  3:24 AM  Result Value Ref Range   Magnesium  2.0 1.7 - 2.4 mg/dL  Phosphorus   Collection Time: 03/30/24  3:24 AM  Result Value Ref Range   Phosphorus 2.8 2.5 - 4.6 mg/dL      Assessment & Plan:   Problem List Items Addressed This Visit     Atherosclerosis of aorta   Relevant Medications   lovastatin  (MEVACOR ) 20 MG tablet   losartan (COZAAR) 25 MG tablet   Centrilobular emphysema (HCC)   Cirrhosis of liver without ascites (HCC)   Hyperlipidemia   Relevant Medications   lovastatin  (MEVACOR ) 20 MG tablet   losartan (COZAAR) 25 MG tablet   Other Visit Diagnoses       Annual physical exam    -  Primary     Nausea       Relevant Medications   ondansetron  (ZOFRAN -ODT) 8 MG disintegrating tablet     Hypokalemia       Relevant Medications   potassium chloride  SA (KLOR-CON  M) 20 MEQ tablet     Essential hypertension       Relevant Medications   lovastatin  (MEVACOR ) 20 MG tablet   losartan (COZAAR) 25 MG tablet     Vitamin B12 deficiency         Vitamin D  deficiency         History of sepsis            Updated Health Maintenance information Reviewed recent lab results with patient Encouraged improvement to lifestyle with diet and exercise Goal of weight loss  Adult Wellness Visit Routine annual wellness visit conducted. Recent hospitalization for sepsis and pneumonia discussed. Blood work reviewed, showing mild hypokalemia and stable iron levels. Kidney function improved post-hospitalization. Cholesterol and blood sugar levels are well-managed. Thyroid  function is normal. Vitamin D  and B12 levels are adequate, though B12 is slightly elevated. No major new concerns identified. -  Scheduled follow-up in six months for routine wellness check.  HFU Sepsis and pneumonia, recently treated Recent hospitalization for sepsis and pneumonia treated with antibiotics. Oxygen levels improved, and she is off oxygen. Infection likely originated from a urinary source and spread systemically. Completed a seven-day course of Augmentin . No long-term oxygen therapy required. - Continue to monitor oxygen levels at home using a pulse oximeter. - Ensure completion of antibiotic course oral  Pulmonary nodule under surveillance Pulmonary nodule identified on scan, possibly a scar or small nodule. No high-risk features noted. Follow-up Pulmonologist visit scheduled for December 8th to assess stability. Plan for repeat CT per Pulm  Emphysema Chronic emphysema with recent exacerbation due to infection. Breathing improved post-treatment. No long-term oxygen therapy required. - Continue monitoring respiratory status.  Cirrhosis of liver, stable Incidental finding on imaging. Mild cirrhosis noted on scan, likely stable for several years. No new treatment required. - Continue monitoring liver function.  Essential hypertension Blood pressure recorded at 142/88 mmHg. Recently started on losartan 25 mg daily. Too soon to assess full effect of medication. - Continue losartan 25 mg daily. She has only taken one dose of this med, it is too soon to assess if effective - Monitor blood pressure regularly.  Hyperlipidemia Cholesterol levels are well-managed, in the 120s. - Continue current cholesterol management regimen.  Hypokalemia, mild Mild hypokalemia noted on recent blood work. Potassium levels were low during hospitalization. - Prescribed potassium supplement for  one week.  Nausea, intermittent Intermittent nausea likely related to recent infection. Zofran  prescribed for symptomatic relief. - Refilled Zofran  for nausea as needed.  Cognitive changes, likely post-infectious Likely metabolic  / infectious encephalopathy. With some confusion and neurocognitive symptoms, likely related to recent infection. No concerning findings on hospital scan. Gradual improvement expected - Significantly improved already - Continue to monitor cognitive function for improvement.       No orders of the defined types were placed in this encounter.   Meds ordered this encounter  Medications   ondansetron  (ZOFRAN -ODT) 8 MG disintegrating tablet    Sig: Take 1 tablet (8 mg total) by mouth 2 (two) times daily as needed.    Dispense:  30 tablet    Refill:  2   lovastatin  (MEVACOR ) 20 MG tablet    Sig: Take 1 tablet (20 mg total) by mouth at bedtime.    Dispense:  90 tablet    Refill:  1   losartan (COZAAR) 25 MG tablet    Sig: Take 1 tablet (25 mg total) by mouth daily.    Dispense:  90 tablet    Refill:  1   potassium chloride  SA (KLOR-CON  M) 20 MEQ tablet    Sig: Take 1 tablet (20 mEq total) by mouth daily. For 1 week    Dispense:  7 tablet    Refill:  0     Follow up plan: Return in about 6 months (around 09/29/2024) for 6 month follow-up HTN, specialist follow-up.   Marsa Officer, DO [facility] 04/01/2024, 10:53 AM

## 2024-04-06 ENCOUNTER — Inpatient Hospital Stay: Admitting: Family Medicine

## 2024-04-07 ENCOUNTER — Other Ambulatory Visit: Payer: Self-pay

## 2024-04-07 NOTE — Transitions of Care (Post Inpatient/ED Visit) (Signed)
 Transition of Care week 3  Visit Note  04/07/2024  Name: Vanessa Reynolds MRN: [id number]          DOB: 1945-08-05  Situation: Patient enrolled in [facility] 30-day program. Visit completed with Dickey Sink by telephone.   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   Rectal cancer [facility])     Assessment: Patient Reported Symptoms: Cognitive Cognitive Status: Alert and oriented to person, place, and time, Normal speech and language skills      Neurological Neurological Review of Symptoms: No symptoms reported    HEENT HEENT Symptoms Reported: No symptoms reported      Cardiovascular Cardiovascular Symptoms Reported: No symptoms reported Cardiovascular Management Strategies: Medication therapy Cardiovascular Comment: Reminded patient to pick up her medication at the pharmacy  Respiratory Respiratory Symptoms Reported: No symptoms reported    Endocrine Endocrine Symptoms Reported: No symptoms reported    Gastrointestinal Gastrointestinal Symptoms Reported: No symptoms reported      Genitourinary Genitourinary Symptoms Reported: No symptoms reported    Integumentary Integumentary Symptoms Reported: No symptoms reported    Musculoskeletal Musculoskelatal Symptoms Reviewed: Weakness Musculoskeletal Comment: The patient states she may not need PT at this time. She is pretty much indpendent      Psychosocial Psychosocial Symptoms Reported: No  symptoms reported         There were no vitals filed for this visit.    Medications Reviewed Today     Reviewed by Moises Reusing, RN (Case Manager) on 04/07/24 at 1309  Med List Status: <None>   Medication Order Taking? Sig Documenting Provider Last Dose Status Informant  fluticasone  (FLONASE ) 50 MCG/ACT nasal spray [id number]  PLACE 2 SPRAYS INTO BOTH NOSTRILS DAILY.USE FOR 4-6 WEEKS THEN STOP AND USE SEASONALLY OR AS NEEDED  Patient not taking: Reported on 04/01/2024   Edman Marsa PARAS, DO  Active Self, Spouse/Significant Other           Med Note BEVERLEE, JOYCE   Fri Jul 25, 2023  6:18 AM) prn  loratadine  (CLARITIN ) 10 MG tablet [id number]  Take 1 tablet (10 mg total) by mouth daily. Use for 4-6 weeks then stop, and use as needed or seasonally  Patient not taking: Reported on 04/01/2024   Edman Marsa PARAS, DO  Active Self, Spouse/Significant Other           Med Note BEVERLEE, JOYCE   Fri Jul 25, 2023  6:16 AM) prn  losartan  (COZAAR ) 25 MG tablet [id number]  Take 1 tablet (25 mg total) by mouth daily. Edman Marsa PARAS, DO  Active   lovastatin  (MEVACOR ) 20 MG tablet [id number]  Take 1 tablet (20 mg total) by mouth at bedtime. Edman Marsa PARAS, DO  Active   omeprazole  (PRILOSEC) 40 MG capsule [id number]  TAKE 1 CAPSULE BY MOUTH ONCE DAILY BEFORE BREAKFAST Karamalegos, Marsa PARAS, DO  Active Self, Spouse/Significant Other  ondansetron  (ZOFRAN -ODT) 8 MG  disintegrating tablet [id number]  Take 1 tablet (8 mg total) by mouth 2 (two) times daily as needed. Edman Marsa PARAS, DO  Active   potassium chloride  SA (KLOR-CON  M) 20 MEQ tablet [id number]  Take 1 tablet (20 mEq total) by mouth daily. For 1 week Edman Marsa PARAS, DO  Active   sucralfate  (CARAFATE ) 1 g tablet [id number]  Take 1 tablet (1 g total) by mouth 4 (four) times daily -  with meals and at bedtime. As needed for indigestion, abdominal pain  Patient not taking: Reported on 04/01/2024    Edman Marsa PARAS, DO  Active Self, Spouse/Significant Other  vitamin B-12 (CYANOCOBALAMIN ) 100 MCG tablet [id number]  Take 100 mcg by mouth daily. [provider]  Active Self, Spouse/Significant Other            Recommendation:   Continue Current Plan of Care  Follow Up Plan:   Telephone follow-up 2 weeks  Medford Balboa, BSN, RN [place]  VBCI - [facility] RN Care Manager [phone]

## 2024-04-07 NOTE — Patient Instructions (Signed)
 Visit Information  Thank you for taking time to visit with me today. Please don't hesitate to contact me if I can be of assistance to you before our next scheduled telephone appointment.  Our next appointment is by telephone on Wednesday December 3rd at 11:30am  Following is a copy of your care plan:   Goals Addressed             This Visit's Progress    VBCI Transitions of Care (TOC) Care Plan       Problems: (04/07/24) Recent Hospitalization for treatment of Sepsis due to UTI  Goal: (04/07/24) Over the next 30 days, the patient will not experience hospital readmission  Interventions: (04/07/24)  Evaluation of current treatment plan related to Sepsis with UTI, self-management and patient's adherence to plan as established by provider. Discussed plans with patient for ongoing care management follow up and provided patient with direct contact information for care management team You have been given 2 different kind of antibiotics, Zithromax  you will take for the next 3 days and Augmentin  for next 4 days, please start from tomorrow as you already received today's dose. - 11/19 - Completed We also started you on low-dose losartan  as your blood pressure remained elevated-please follow-up closely with your primary care provider for further assistance and dose titration. As we discussed you have a lung nodule close to your lining of lung which need surveillance. Please follow-up with a pulmonologist for further assistance. - Appointment is scheduled for 04/26/24 Please keep yourself well-hydrated and continue taking rest of your home medications as you are doing it before. Follow-up with your primary care provider for further assistance - Appointment 11/18- Completed Follow up at home with O2 saturations. Keep them at 90% or higher Take Ondasetron as needed for Nausea  Patient Self Care Activities: (04/07/24) Attend all scheduled provider appointments Call pharmacy for medication refills  3-7 days in advance of running out of medications Call provider office for new concerns or questions  Notify RN Care Manager of [facility] call rescheduling needs Participate in Transition of Care Program/Attend [facility] scheduled calls Perform all self care activities independently  Take medications as prescribed    Plan:  Telephone follow up appointment with care management team member scheduled for:  Wednesday December 3rd at 11:30am        The patient verbalized understanding of instructions, educational materials, and care plan provided today and agreed to receive a mailed copy of patient instructions, educational materials, and care plan.   The patient has been provided with contact information for the care management team and has been advised to call with any health related questions or concerns.   Please call the care guide team at [phone] if you need to cancel or reschedule your appointment.   Please call the Suicide and Crisis Lifeline: 988 call the [place]  National Suicide Prevention Lifeline: (603)284-4013 or TTY: (714)069-9892 TTY (310)875-8469) to talk to a trained counselor if you are experiencing a Mental Health or Behavioral Health Crisis or need someone to talk to.  Medford Balboa, BSN, RN [place]  VBCI - [employer] [facility] RN Care Manager [phone]

## 2024-04-21 ENCOUNTER — Telehealth: Payer: Self-pay

## 2024-04-26 ENCOUNTER — Encounter: Payer: Self-pay | Admitting: Pulmonary Disease

## 2024-04-26 ENCOUNTER — Ambulatory Visit: Admitting: Pulmonary Disease

## 2024-04-26 VITALS — BP 122/60 | HR 72 | Temp 98.5°F | Ht 62.0 in | Wt 167.8 lb

## 2024-04-26 DIAGNOSIS — J449 Chronic obstructive pulmonary disease, unspecified: Secondary | ICD-10-CM | POA: Diagnosis not present

## 2024-04-26 DIAGNOSIS — K7460 Unspecified cirrhosis of liver: Secondary | ICD-10-CM

## 2024-04-26 DIAGNOSIS — J439 Emphysema, unspecified: Secondary | ICD-10-CM | POA: Diagnosis not present

## 2024-04-26 DIAGNOSIS — R911 Solitary pulmonary nodule: Secondary | ICD-10-CM | POA: Diagnosis not present

## 2024-04-26 DIAGNOSIS — Z87891 Personal history of nicotine dependence: Secondary | ICD-10-CM

## 2024-04-26 DIAGNOSIS — R053 Chronic cough: Secondary | ICD-10-CM

## 2024-04-26 MED ORDER — ALBUTEROL SULFATE HFA 108 (90 BASE) MCG/ACT IN AERS: 2.0000 | INHALATION_SPRAY | Freq: Four times a day (QID) | RESPIRATORY_TRACT | 6 refills | Status: AC | PRN

## 2024-04-26 NOTE — Progress Notes (Signed)
 Synopsis: Referred in by Edman Blunt *   Subjective:   PATIENT ID: Vanessa Reynolds GENDER: female DOB: 06-Nov-1945, MRN: [id number]  Chief Complaint  Patient presents with   Medical Management of Chronic Issues    DOE. No wheezing. Cough, dry.    Discussed the use of AI scribe software for clinical note transcription with the patient, who gave verbal consent to proceed.  History of Present Illness   Vanessa Reynolds is a 78 year old female with emphysema who presents for follow-up of lung nodules and recent pneumonia.  She was hospitalized a month ago for pneumonia and a urinary tract infection. During this hospitalization, a CT scan revealed lung scarring and a lung nodule in the left upper lung, which was previously noted in a CT scan from March 2025. No current symptoms of pneumonia are present, and she did not feel sick during the episode. The timeline of her past COVID-19 infection is unclear.  She has a history of emphysema, likely related to her past smoking habit. She quit smoking approximately 20 years ago after smoking about a pack a day since the age of 5. She experiences occasional shortness of breath, especially with exertion, and has a dry cough that her son notes is more frequent than she perceives. No significant phlegm production is reported, and there is no involuntary weight loss, though she is trying to lose weight voluntarily.  Her past medical history includes colon cancer diagnosed in 2011, for which she underwent surgery. There is no family history of lung diseases, but her father had stomach cancer. She has been informed of liver scarring in the past, though she has not seen a gastrointestinal specialist for this issue. She denies any history of alcohol use.        Family History  Problem Relation Age of Onset   Hypertension Mother        deceased, age 56   Cancer Father        gastric cancer, deceased, mid 21's   Breast cancer Neg Hx      Social  History   Socioeconomic History   Marital status: Married    Spouse name: Rayma Hegg   Number of children: 3   Years of education: Not on file   Highest education level: 10th grade  Occupational History    Comment: fulltime    Occupation: Retired  Tobacco Use   Smoking status: Former    Current packs/day: 0.00    Average packs/day: 2.0 packs/day for 30.0 years (60.0 ttl pk-yrs)    Types: Cigarettes    Start date: 05/21/1967    Quit date: 05/20/1997    Years since quitting: 26.9   Smokeless tobacco: Former  Building Services Engineer status: Never Used  Substance and Sexual Activity   Alcohol use: No   Drug use: No   Sexual activity: Not on file  Other Topics Concern   Not on file  Social History Narrative   Not on file   Social Drivers of Health   Financial Resource Strain: Low Risk  (08/27/2021)   Overall Financial Resource Strain (CARDIA)    Difficulty of Paying Living Expenses: Not hard at all  Food Insecurity: No Food Insecurity (03/31/2024)   Hunger Vital Sign    Worried About Running Out of Food in the Last Year: Never true    Ran Out of Food in the Last Year: Never true  Transportation Needs: No Transportation Needs (03/31/2024)   PRAPARE -  Administrator, Civil Service (Medical): No    Lack of Transportation (Non-Medical): No  Physical Activity: Inactive (08/27/2021)   Exercise Vital Sign    Days of Exercise per Week: 0 days    Minutes of Exercise per Session: 0 min  Stress: No Stress Concern Present (08/27/2021)   [employer] of Occupational Health - Occupational Stress Questionnaire    Feeling of Stress : Not at all  Social Connections: Moderately Integrated (03/28/2024)   Social Connection and Isolation Panel    Frequency of Communication with Friends and Family: Three times a week    Frequency of Social Gatherings with Friends and Family: Three times a week    Attends Religious Services: Patient unable to answer    Active Member of Clubs or  Organizations: Yes    Attends Club or Organization Meetings: Patient unable to answer    Marital Status: Married  Intimate Partner Violence: Not At Risk (03/31/2024)   Humiliation, Afraid, Rape, and Kick questionnaire    Fear of Current or Ex-Partner: No    Emotionally Abused: No    Physically Abused: No    Sexually Abused: No        Objective:   Vitals:   04/26/24 1404  BP: 122/60  Pulse: 72  Temp: 98.5 F (36.9 C)  SpO2: 98%  Weight: 167 lb 12.8 oz (76.1 kg)  Height: 5' 2 (1.575 m)   98% on RA BMI Readings from Last 3 Encounters:  04/26/24 30.69 kg/m  04/01/24 30.09 kg/m  03/31/24 32.74 kg/m   Wt Readings from Last 3 Encounters:  04/26/24 167 lb 12.8 oz (76.1 kg)  04/01/24 164 lb 8 oz (74.6 kg)  03/31/24 179 lb (81.2 kg)    Physical Exam GEN: NAD, Healthy Appearing HEENT: Supple Neck, Reactive Pupils, EOMI  CVS: Normal S1, Normal S2, RRR, No murmurs or ES appreciated  Lungs: Clear bilateral air entry.  Abdomen: Soft, non tender, non distended, + BS  Extremities: Warm and well perfused, No edema   Labs and imaging were reviewed.   Ancillary Information   CBC    Component Value Date/Time   WBC 5.8 03/30/2024 0324   RBC 3.89 03/30/2024 0324   HGB 11.4 (L) 03/30/2024 0324   HGB 13.1 02/20/2015 1138   HCT 34.1 (L) 03/30/2024 0324   HCT 39.0 02/20/2015 1138   PLT 121 (L) 03/30/2024 0324   PLT 207 02/20/2015 1138   MCV 87.7 03/30/2024 0324   MCV 90 02/20/2015 1138   MCH 29.3 03/30/2024 0324   MCHC 33.4 03/30/2024 0324   RDW 13.0 03/30/2024 0324   RDW 13.4 02/20/2015 1138   LYMPHSABS 0.5 (L) 03/28/2024 0012   LYMPHSABS 1.9 02/20/2015 1138   MONOABS 0.3 03/28/2024 0012   EOSABS 0.0 03/28/2024 0012   EOSABS 0.5 (H) 02/20/2015 1138   BASOSABS 0.0 03/28/2024 0012   BASOSABS 0.1 02/20/2015 1138        No data to display           Assessment & Plan:  Assessment and Plan    #Pulmonary nodule 1.7 cm ground glass nodule in left upper lung.   Present on prior CAT scan 8 months ago and has been stable.  Differential diagnosis including slow-growing adenocarcinoma. - Ordered repeat CT scan in four weeks to assess pneumonia resolution and nodule size. - Will evaluate need for biopsy based on CT scan results.  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 No acute exacerbation. - Ordered pulmonary function test to assess lung function and capacity. - Prescribed albuterol  inhaler for use as needed for shortness of breath.  #Cirrhosis of liver Liver scarring with no recent gastroenterologist evaluation.  Denies any alcohol use. - Referred to gastroenterologist for evaluation of liver cirrhosis.     Return in about 3 months (around 07/25/2024).  I personally spent a total of 60 minutes in the care of the patient today including preparing to see the patient, getting/reviewing separately obtained history, performing a medically appropriate exam/evaluation, counseling and educating, placing orders, documenting clinical information in the EHR, independently interpreting results, and communicating results.   Darrin Barn, MD [facility] 04/26/2024 2:28 PM

## 2024-04-27 ENCOUNTER — Other Ambulatory Visit

## 2024-04-27 NOTE — Patient Instructions (Signed)
 Visit Information  Thank you for taking time to visit with me today. Please don't hesitate to contact me if I can be of assistance to you before our next scheduled telephone appointment.  Our next appointment is by telephone on Tuesday December 16th at 2:00pm  Following is a copy of your care plan:   Goals Addressed             This Visit's Progress    VBCI Transitions of Care (TOC) Care Plan       Problems: (reviewed 04/27/24) Recent Hospitalization for treatment of Sepsis due to UTI  Goal: (reviewed 04/27/24) Over the next 30 days, the patient will not experience hospital readmission  Interventions: (reviewed 04/27/24)  Evaluation of current treatment plan related to Sepsis with UTI, self-management and patient's adherence to plan as established by provider. Discussed plans with patient for ongoing care management follow up and provided patient with direct contact information for care management team You have been given 2 different kind of antibiotics, Zithromax  you will take for the next 3 days and Augmentin  for next 4 days, please start from tomorrow as you already received today's dose. - 11/19 - Completed We also started you on low-dose losartan  as your blood pressure remained elevated-please follow-up closely with your primary care provider for further assistance and dose titration. As we discussed you have a lung nodule close to your lining of lung which need surveillance. Please follow-up with a pulmonologist for further assistance. - Appointment is scheduled for 04/26/24 - Completed Please keep yourself well-hydrated and continue taking rest of your home medications as you are doing it before. Follow-up with your primary care provider for further assistance - Appointment 11/18- Completed Follow up at home with O2 saturations. Keep them at 90% or higher Take Ondasetron as needed for Nausea  Patient Self Care Activities: (reviewed 04/27/24) Attend all scheduled provider  appointments Call pharmacy for medication refills 3-7 days in advance of running out of medications Call provider office for new concerns or questions  Notify RN Care Manager of [facility] call rescheduling needs Participate in Transition of Care Program/Attend [facility] scheduled calls Perform all self care activities independently  Take medications as prescribed    Plan:  Telephone follow up appointment with care management team member scheduled for:   Tuesday December 16th at 2:00pm        The patient verbalized understanding of instructions, educational materials, and care plan provided today and agreed to receive a mailed copy of patient instructions, educational materials, and care plan.    Please call the care guide team at [phone] if you need to cancel or reschedule your appointment.   Please call the Suicide and Crisis Lifeline: 988 call the [place]  National Suicide Prevention Lifeline: 610-018-7219 or TTY: (743)870-2944 TTY 351-022-4707) to talk to a trained counselor if you are experiencing a Mental Health or Behavioral Health Crisis or need someone to talk to.  Medford Balboa, BSN, RN [place]  VBCI - [employer] [facility] RN Care Manager [phone]

## 2024-04-27 NOTE — Transitions of Care (Post Inpatient/ED Visit) (Signed)
 Transition of Care week 3  Visit Note  04/27/2024  Name: Vanessa Reynolds MRN: [id number]          DOB: 1946-05-12  Situation: Patient enrolled in [facility] 30-day program. Visit completed with Dickey Sink by telephone.   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   Rectal cancer [facility])     Assessment: Patient Reported Symptoms: Cognitive Cognitive Status: Alert and oriented to person, place, and time, Normal speech and language skills      Neurological Neurological Review of Symptoms: No symptoms reported    HEENT HEENT Symptoms Reported: No symptoms reported      Cardiovascular Cardiovascular Symptoms Reported: No symptoms reported Cardiovascular Management Strategies: Medication therapy  Respiratory Respiratory Symptoms Reported: No symptoms reported Respiratory Management Strategies: Medication therapy, Routine screening, Coping strategies, Adequate rest  Endocrine Endocrine Symptoms Reported: No symptoms reported Is patient diabetic?: No    Gastrointestinal Gastrointestinal Symptoms Reported: No symptoms reported      Genitourinary Genitourinary Symptoms Reported: No symptoms reported    Integumentary Integumentary Symptoms Reported: No symptoms reported    Musculoskeletal Musculoskelatal Symptoms Reviewed: No symptoms reported        Psychosocial Psychosocial Symptoms Reported: No symptoms reported         There were no vitals  filed for this visit.    Medications Reviewed Today     Reviewed by Moises Reusing, RN (Case Manager) on 04/27/24 at 1440  Med List Status: <None>   Medication Order Taking? Sig Documenting Provider Last Dose Status Informant  albuterol  (VENTOLIN  HFA) 108 (90 Base) MCG/ACT inhaler [id number]  Inhale 2 puffs into the lungs every 6 (six) hours as needed for wheezing or shortness of breath. Malka Domino, MD  Active   fluticasone  (FLONASE ) 50 MCG/ACT nasal spray [id number]  PLACE 2 SPRAYS INTO BOTH NOSTRILS DAILY.USE FOR 4-6 WEEKS THEN STOP AND USE SEASONALLY OR AS NEEDED  Patient not taking: Reported on 04/26/2024   Edman Marsa PARAS, DO  Active Self, Spouse/Significant Other           Med Note BEVERLEE, JOYCE   Fri Jul 25, 2023  6:18 AM) prn  loratadine  (CLARITIN ) 10 MG tablet [id number]  Take 1 tablet (10 mg total) by mouth daily. Use for 4-6 weeks then stop, and use as needed or seasonally  Patient not taking: Reported on 04/26/2024   Edman Marsa PARAS, DO  Active Self, Spouse/Significant Other           Med Note BEVERLEE, JOYCE   Fri Jul 25, 2023  6:16 AM) prn  losartan  (COZAAR ) 25 MG tablet [id number]  Take 1 tablet (25 mg total) by mouth daily. Edman Marsa PARAS, DO  Active   lovastatin  (MEVACOR ) 20 MG tablet [id number]  Take 1 tablet (20 mg total) by mouth at bedtime. Edman Marsa PARAS, DO  Active   omeprazole  (PRILOSEC) 40 MG capsule [id number]  TAKE  1 CAPSULE BY MOUTH ONCE DAILY BEFORE BREAKFAST Karamalegos, Marsa PARAS, DO  Active Self, Spouse/Significant Other  ondansetron  (ZOFRAN -ODT) 8 MG disintegrating tablet [id number]  Take 1 tablet (8 mg total) by mouth 2 (two) times daily as needed. Karamalegos, Marsa PARAS, DO  Active   potassium chloride  SA (KLOR-CON  M) 20 MEQ tablet [id number]  Take 1 tablet (20 mEq total) by mouth daily. For 1 week Edman Marsa PARAS, DO  Active   sucralfate  (CARAFATE ) 1 g tablet [id number]  Take 1 tablet (1 g total) by mouth 4  (four) times daily -  with meals and at bedtime. As needed for indigestion, abdominal pain  Patient not taking: Reported on 04/26/2024   Edman Marsa PARAS, DO  Active Self, Spouse/Significant Other  vitamin B-12 (CYANOCOBALAMIN ) 100 MCG tablet [id number]  Take 100 mcg by mouth daily. [provider]  Active Self, Spouse/Significant Other            Recommendation:   Continue Current Plan of Care  Follow Up Plan:   Telephone follow-up in 1 week  Medford Balboa, BSN, RN [place]  VBCI - [facility] RN Care Manager [phone]

## 2024-04-27 NOTE — Transitions of Care (Post Inpatient/ED Visit) (Deleted)
 Transition of Care week 3  Visit Note  04/27/2024  Name: Vanessa Reynolds MRN: [id number]          DOB: 1946-05-12  Situation: Patient enrolled in [facility] 30-day program. Visit completed with Dickey Sink by telephone.   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   Rectal cancer [facility])     Assessment: Patient Reported Symptoms: Cognitive Cognitive Status: Alert and oriented to person, place, and time, Normal speech and language skills      Neurological Neurological Review of Symptoms: No symptoms reported    HEENT HEENT Symptoms Reported: No symptoms reported      Cardiovascular Cardiovascular Symptoms Reported: No symptoms reported Cardiovascular Management Strategies: Medication therapy  Respiratory Respiratory Symptoms Reported: No symptoms reported Respiratory Management Strategies: Medication therapy, Routine screening, Coping strategies, Adequate rest  Endocrine Endocrine Symptoms Reported: No symptoms reported Is patient diabetic?: No    Gastrointestinal Gastrointestinal Symptoms Reported: No symptoms reported      Genitourinary Genitourinary Symptoms Reported: No symptoms reported    Integumentary Integumentary Symptoms Reported: No symptoms reported    Musculoskeletal Musculoskelatal Symptoms Reviewed: No symptoms reported        Psychosocial Psychosocial Symptoms Reported: No symptoms reported         There were no vitals  filed for this visit.    Medications Reviewed Today     Reviewed by Moises Reusing, RN (Case Manager) on 04/27/24 at 1440  Med List Status: <None>   Medication Order Taking? Sig Documenting Provider Last Dose Status Informant  albuterol  (VENTOLIN  HFA) 108 (90 Base) MCG/ACT inhaler [id number]  Inhale 2 puffs into the lungs every 6 (six) hours as needed for wheezing or shortness of breath. Malka Domino, MD  Active   fluticasone  (FLONASE ) 50 MCG/ACT nasal spray [id number]  PLACE 2 SPRAYS INTO BOTH NOSTRILS DAILY.USE FOR 4-6 WEEKS THEN STOP AND USE SEASONALLY OR AS NEEDED  Patient not taking: Reported on 04/26/2024   Edman Marsa PARAS, DO  Active Self, Spouse/Significant Other           Med Note BEVERLEE, JOYCE   Fri Jul 25, 2023  6:18 AM) prn  loratadine  (CLARITIN ) 10 MG tablet [id number]  Take 1 tablet (10 mg total) by mouth daily. Use for 4-6 weeks then stop, and use as needed or seasonally  Patient not taking: Reported on 04/26/2024   Edman Marsa PARAS, DO  Active Self, Spouse/Significant Other           Med Note BEVERLEE, JOYCE   Fri Jul 25, 2023  6:16 AM) prn  losartan  (COZAAR ) 25 MG tablet [id number]  Take 1 tablet (25 mg total) by mouth daily. Edman Marsa PARAS, DO  Active   lovastatin  (MEVACOR ) 20 MG tablet [id number]  Take 1 tablet (20 mg total) by mouth at bedtime. Edman Marsa PARAS, DO  Active   omeprazole  (PRILOSEC) 40 MG capsule [id number]  TAKE  1 CAPSULE BY MOUTH ONCE DAILY BEFORE BREAKFAST Karamalegos, Marsa PARAS, DO  Active Self, Spouse/Significant Other  ondansetron  (ZOFRAN -ODT) 8 MG disintegrating tablet [id number]  Take 1 tablet (8 mg total) by mouth 2 (two) times daily as needed. Karamalegos, Marsa PARAS, DO  Active   potassium chloride  SA (KLOR-CON  M) 20 MEQ tablet [id number]  Take 1 tablet (20 mEq total) by mouth daily. For 1 week Edman Marsa PARAS, DO  Active   sucralfate  (CARAFATE ) 1 g tablet [id number]  Take 1 tablet (1 g total) by mouth 4  (four) times daily -  with meals and at bedtime. As needed for indigestion, abdominal pain  Patient not taking: Reported on 04/26/2024   Edman Marsa PARAS, DO  Active Self, Spouse/Significant Other  vitamin B-12 (CYANOCOBALAMIN ) 100 MCG tablet [id number]  Take 100 mcg by mouth daily. [provider]  Active Self, Spouse/Significant Other            Recommendation:   Continue Current Plan of Care  Follow Up Plan:   Telephone follow-up in 1 week  Medford Balboa, BSN, RN [place]  VBCI - [facility] RN Care Manager [phone]

## 2024-04-28 ENCOUNTER — Encounter: Payer: Self-pay | Admitting: Family Medicine

## 2024-04-28 ENCOUNTER — Ambulatory Visit (INDEPENDENT_AMBULATORY_CARE_PROVIDER_SITE_OTHER): Admitting: Family Medicine

## 2024-04-28 VITALS — BP 138/80 | HR 78 | Ht 62.0 in | Wt 165.8 lb

## 2024-04-28 DIAGNOSIS — N3001 Acute cystitis with hematuria: Secondary | ICD-10-CM | POA: Diagnosis not present

## 2024-04-28 DIAGNOSIS — R300 Dysuria: Secondary | ICD-10-CM

## 2024-04-28 DIAGNOSIS — D329 Benign neoplasm of meninges, unspecified: Secondary | ICD-10-CM

## 2024-04-28 LAB — POCT URINE DIPSTICK
Bilirubin, UA: NEGATIVE
Glucose, UA: NEGATIVE mg/dL
Ketones, POC UA: NEGATIVE mg/dL
Nitrite, UA: POSITIVE — AB
POC PROTEIN,UA: 30 — AB
Spec Grav, UA: 1.015 (ref 1.010–1.025)
Urobilinogen, UA: 0.2 U/dL
pH, UA: 6 (ref 5.0–8.0)

## 2024-04-28 MED ORDER — CEPHALEXIN 500 MG PO CAPS: 500.0000 mg | ORAL_CAPSULE | Freq: Three times a day (TID) | ORAL | 0 refills | Status: AC

## 2024-04-28 NOTE — Patient Instructions (Addendum)
Thank you for coming to the office today.  1. You have a Urinary Tract Infection - this is very common, your symptoms are reassuring and you should get better within 1 week on the antibiotics - Start Keflex 500mg 3 times daily for next 7 days, complete entire course, even if feeling better - We sent urine for a culture, we will call you within next few days if we need to change antibiotics - Please drink plenty of fluids, improve hydration over next 1 week  If symptoms worsening, developing nausea / vomiting, worsening back pain, fevers / chills / sweats, then please return for re-evaluation sooner.  If you take AZO OTC - limit this to 2-3 days MAX to avoid affecting kidneys  D-Mannose is a natural supplement that can actually help bind to urinary bacteria and reduce their effectiveness it can help prevent UTI from forming, and may reduce some symptoms. It likely cannot cure an active UTI but it is worth a try and good to prevent them with. Try 500mg twice a day at a full dose if you want, or check package instructions for more info   Please schedule a Follow-up Appointment to: Return if symptoms worsen or fail to improve.  If you have any other questions or concerns, please feel free to call the office or send a message through MyChart. You may also schedule an earlier appointment if necessary.  Additionally, you may be receiving a survey about your experience at our office within a few days to 1 week by e-mail or mail. We value your feedback.  Nikoleta Dady, DO [facility], [facility] 

## 2024-04-28 NOTE — Progress Notes (Signed)
 Subjective:    Patient ID: Vanessa Reynolds, female    DOB: October 19, 1945, 78 y.o.   MRN: [id number]  Vanessa Reynolds is a 78 y.o. female presenting on 04/28/2024 for Dysuria (X 2 days ) and Abdominal Pain   HPI  Discussed the use of AI scribe software for clinical note transcription with the patient, who gave verbal consent to proceed.  History of Present Illness   Vanessa Reynolds is a 78 year old female who presents with recurrent urinary tract infection symptoms.  UTI Urinary tract infection symptoms - Two days of pelvic pressure, burning, and pain - Cloudy urine with new onset hematuria - Dipstick urinalysis positive for nitrites and moderate leukocytes  Concern for Recurrent urinary tract infection - Previous urinary tract infection diagnosed in November Hospitalization Urosepsis - Treated with Augmentin  after discharge - Prior urine culture showed E. coli sensitive to all antibiotics  Medication tolerance and allergies - No reported allergies to medications  Meningioma Followed by Neurosurgery Dr Clois for Meningioma surveillance. Now no recent follow-up. On surveillance if she has problem.        04/01/2024   11:14 AM 08/20/2023    1:20 PM 03/19/2023    9:18 AM  Depression screen PHQ 2/9  Decreased Interest 2 2 0  Down, Depressed, Hopeless 0 0 0  PHQ - 2 Score 2 2 0  Altered sleeping 0 1 1  Tired, decreased energy 1 3 0  Change in appetite 1 3 0  Feeling bad or failure about yourself  0 2 0  Trouble concentrating 1  0  Moving slowly or fidgety/restless 1 0 0  Suicidal thoughts 0 0 0  PHQ-9 Score 6 11  1    Difficult doing work/chores Somewhat difficult Not difficult at all Not difficult at all     Data saved with a previous flowsheet row definition       04/28/2024    4:02 PM 04/01/2024   11:15 AM 08/20/2023    1:20 PM 03/19/2023    9:19 AM  GAD 7 : Generalized Anxiety Score  Nervous, Anxious, on Edge 0 0 0 0  Control/stop worrying 0 0 0 0  Worry too  much - different things 0 0 0 1  Trouble relaxing 0 0 0 0  Restless 0 0 0 1  Easily annoyed or irritable 0 0 0 1  Afraid - awful might happen 0 0 0 1  Total GAD 7 Score 0 0 0 4  Anxiety Difficulty Not difficult at all Not difficult at all Not difficult at all     Social History   Tobacco Use   Smoking status: Former    Current packs/day: 0.00    Average packs/day: 2.0 packs/day for 30.0 years (60.0 ttl pk-yrs)    Types: Cigarettes    Start date: 05/21/1967    Quit date: 05/20/1997    Years since quitting: 26.9   Smokeless tobacco: Former  Building Services Engineer status: Never Used  Substance Use Topics   Alcohol use: No   Drug use: No    Review of Systems Per HPI unless specifically indicated above     Objective:    BP 138/80 (BP Location: Right Arm, Patient Position: Sitting, Cuff Size: Normal)   Pulse 78   Ht 5' 2 (1.575 m)   Wt 165 lb 12.8 oz (75.2 kg)   SpO2 92%   BMI 30.33 kg/m   Wt Readings from Last 3 Encounters:  04/28/24 165  lb 12.8 oz (75.2 kg)  04/26/24 167 lb 12.8 oz (76.1 kg)  04/01/24 164 lb 8 oz (74.6 kg)    Physical Exam Vitals and nursing note reviewed.  Constitutional:      General: She is not in acute distress.    Appearance: Normal appearance. She is well-developed. She is not diaphoretic.     Comments: Well-appearing, comfortable, cooperative  HENT:     Head: Normocephalic and atraumatic.  Eyes:     General:        Right eye: No discharge.        Left eye: No discharge.     Conjunctiva/sclera: Conjunctivae normal.  Cardiovascular:     Rate and Rhythm: Normal rate.  Pulmonary:     Effort: Pulmonary effort is normal.  Skin:    General: Skin is warm and dry.     Findings: No erythema or rash.  Neurological:     Mental Status: She is alert and oriented to person, place, and time.  Psychiatric:        Mood and Affect: Mood normal.        Behavior: Behavior normal.        Thought Content: Thought content normal.     Comments: Well  groomed, good eye contact, normal speech and thoughts    I have personally reviewed the radiology report from 03/28/24 on CT Head.  EXAM: CT HEAD WITHOUT CONTRAST 03/28/2024 01:16:26 AM   TECHNIQUE: CT of the head was performed without the administration of intravenous contrast. Automated exposure control, iterative reconstruction, and/or weight based adjustment of the mA/kV was utilized to reduce the radiation dose to as low as reasonably achievable.   COMPARISON: 07/25/2023   CLINICAL HISTORY: Mental status change, unknown cause.   FINDINGS:   BRAIN AND VENTRICLES: No acute hemorrhage. No evidence of acute infarct. No hydrocephalus. No extra-axial collection. No mass effect or midline shift. Stable right parafalcine calcified meningioma. Subcortical and periventricular small vessel ischemic changes.   ORBITS: No acute abnormality.   SINUSES: Partial opacification of the right maxillary sinus.   SOFT TISSUES AND SKULL: No acute soft tissue abnormality. No skull fracture.   IMPRESSION: 1. No acute intracranial abnormality.   Electronically signed by: Pinkie Pebbles MD 03/28/2024 01:19 AM EST RP Workstation: [id number]  Results for orders placed or performed in visit on 04/28/24  POCT URINE DIPSTICK   Collection Time: 04/28/24  3:35 PM  Result Value Ref Range   Color, UA yellow yellow   Clarity, UA cloudy (A) clear   Glucose, UA negative negative mg/dL   Bilirubin, UA negative negative   Ketones, POC UA negative negative mg/dL   Spec Grav, UA 8.984 8.989 - 1.025   Blood, UA moderate (A) negative   pH, UA 6.0 5.0 - 8.0   POC PROTEIN,UA =30 (A) negative, trace   Urobilinogen, UA 0.2 0.2 or 1.0 E.U./dL   Nitrite, UA Positive (A) Negative   Leukocytes, UA Moderate (2+) (A) Negative      Assessment & Plan:   Problem List Items Addressed This Visit     Dysuria   Relevant Orders   POCT URINE DIPSTICK (Completed)   Urine Culture   Meningioma (HCC)    Other Visit Diagnoses       Acute cystitis with hematuria    -  Primary   Relevant Medications   cephALEXin  (KEFLEX ) 500 MG capsule        Acute cystitis with hematuria 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 Hematuria likely due to infection. Recent hospitalization with Urosepsis with E Coli UTI treated successfully with antibiotics, finished Augmentin  previously, it was resolved x 1 month now returned past 2 days.  - Prescribed Keflex  500 mg TID for 7 days. - Advised consistent dosing with or without food. - Recommended D-mannose supplement. - Consider urology referral if symptoms persist.  Meningioma Followed by Neurosurgery for surveillance, has done well without concerns Last imaging CT 03/28/24 with stable calcified meningioma  Orders Placed This Encounter  Procedures   Urine Culture   POCT URINE DIPSTICK    Meds ordered this encounter  Medications   cephALEXin  (KEFLEX ) 500 MG capsule    Sig: Take 1 capsule (500 mg total) by mouth 3 (three) times daily. For 7 days    Dispense:  21 capsule    Refill:  0    Follow up plan: Return if symptoms worsen or fail to improve.  Marsa Officer, DO [facility] 04/28/2024, 4:22 PM

## 2024-04-30 LAB — URINE CULTURE
MICRO NUMBER:: 17339042
SPECIMEN QUALITY:: ADEQUATE

## 2024-05-04 ENCOUNTER — Ambulatory Visit: Payer: Self-pay | Admitting: Family Medicine

## 2024-05-04 ENCOUNTER — Other Ambulatory Visit: Payer: Self-pay

## 2024-05-04 NOTE — Transitions of Care (Post Inpatient/ED Visit) (Signed)
 Transition of Care week 4  Visit Note  05/04/2024  Name: Vanessa Reynolds MRN: [id number]          DOB: Feb 28, 1946  Situation: Patient enrolled in [facility] 30-day program. Visit completed with Dickey Sink by telephone.   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   Rectal cancer [facility])     Assessment: Patient Reported Symptoms: Cognitive Cognitive Status: Alert and oriented to person, place, and time, Normal speech and language skills      Neurological Neurological Review of Symptoms: No symptoms reported    HEENT HEENT Symptoms Reported: No symptoms reported      Cardiovascular Cardiovascular Symptoms Reported: No symptoms reported Cardiovascular Management Strategies: Medication therapy  Respiratory Respiratory Symptoms Reported: No symptoms reported Respiratory Management Strategies: Medication therapy, Coping strategies, Routine screening, Adequate rest  Endocrine Endocrine Symptoms Reported: No symptoms reported Is patient diabetic?: No    Gastrointestinal Gastrointestinal Symptoms Reported: No symptoms reported      Genitourinary Genitourinary Symptoms Reported: Pain/burning with urination Genitourinary Management Strategies: Medication therapy Genitourinary Comment: The patient is on her second round of antibiotics  Integumentary Integumentary Symptoms Reported: No symptoms reported    Musculoskeletal Musculoskelatal Symptoms Reviewed: No  symptoms reported        Psychosocial Psychosocial Symptoms Reported: No symptoms reported         There were no vitals filed for this visit.    Medications Reviewed Today     Reviewed by Moises Reusing, RN (Case Manager) on 05/04/24 at 1344  Med List Status: <None>   Medication Order Taking? Sig Documenting Provider Last Dose Status Informant  albuterol  (VENTOLIN  HFA) 108 (90 Base) MCG/ACT inhaler [id number] Yes Inhale 2 puffs into the lungs every 6 (six) hours as needed for wheezing or shortness of breath. Malka Domino, MD  Active   cephALEXin  (KEFLEX ) 500 MG capsule [id number]  Take 1 capsule (500 mg total) by mouth 3 (three) times daily. For 7 days Edman Marsa PARAS, DO  Active   fluticasone  (FLONASE ) 50 MCG/ACT nasal spray [id number] Yes PLACE 2 SPRAYS INTO BOTH NOSTRILS DAILY.USE FOR 4-6 WEEKS THEN STOP AND USE SEASONALLY OR AS NEEDED Karamalegos, Marsa PARAS, DO  Active Self, Spouse/Significant Other           Med Note BEVERLEE, JOYCE   Fri Jul 25, 2023  6:18 AM) prn  loratadine  (CLARITIN ) 10 MG tablet [id number] Yes Take 1 tablet (10 mg total) by mouth daily. Use for 4-6 weeks then stop, and use as needed or seasonally [place], Marsa PARAS, DO  Active Self, Spouse/Significant Other           Med Note BEVERLEE, JOYCE   Fri Jul 25, 2023  6:16 AM) prn  losartan  (COZAAR ) 25 MG tablet [id number] Yes Take 1 tablet (25 mg total) by mouth daily. Edman Marsa PARAS, DO  Active   lovastatin  (MEVACOR ) 20 MG tablet [id number] Yes Take 1  tablet (20 mg total) by mouth at bedtime. Edman Marsa PARAS, DO  Active   omeprazole  (PRILOSEC) 40 MG capsule [id number] Yes TAKE 1 CAPSULE BY MOUTH ONCE DAILY BEFORE BREAKFAST Karamalegos, Marsa PARAS, DO  Active Self, Spouse/Significant Other  ondansetron  (ZOFRAN -ODT) 8 MG disintegrating tablet [id number] Yes Take 1 tablet (8 mg total) by mouth 2 (two) times daily as needed. Edman Marsa PARAS, DO  Active   vitamin B-12  (CYANOCOBALAMIN ) 100 MCG tablet [id number] Yes Take 100 mcg by mouth daily. [provider]  Active Self, Spouse/Significant Other  VITAMIN D  PO [id number]  Take by mouth. [provider]  Active             Recommendation:   Continue Current Plan of Care  Follow Up Plan:   Telephone follow-up in 1 week  Medford Balboa, BSN, RN [place]  VBCI - [facility] RN Care Manager [phone]

## 2024-05-04 NOTE — Patient Instructions (Signed)
 Visit Information  Thank you for taking time to visit with me today. Please don't hesitate to contact me if I can be of assistance to you before our next scheduled telephone appointment.  Our next appointment is by telephone on Tuesday December 30th at 2:00pm  Following is a copy of your care plan:   Goals Addressed             This Visit's Progress    VBCI Transitions of Care (TOC) Care Plan       Problems: (reviewed 05/04/24) Recent Hospitalization for treatment of Sepsis due to UTI  Goal: (reviewed 05/04/24) Over the next 30 days, the patient will not experience hospital readmission  Interventions: (reviewed 05/04/24)  Evaluation of current treatment plan related to Sepsis with UTI, self-management and patient's adherence to plan as established by provider. Discussed plans with patient for ongoing care management follow up and provided patient with direct contact information for care management team You have been given 2 different kind of antibiotics, Zithromax  you will take for the next 3 days and Augmentin  for next 4 days, please start from tomorrow as you already received today's dose. - 11/19 - Completed We also started you on low-dose losartan  as your blood pressure remained elevated-please follow-up closely with your primary care provider for further assistance and dose titration. As we discussed you have a lung nodule close to your lining of lung which need surveillance. Please follow-up with a pulmonologist for further assistance. - Appointment is scheduled for 04/26/24 - Completed Please keep yourself well-hydrated and continue taking rest of your home medications as you are doing it before. Follow-up with your primary care provider for further assistance - Appointment 11/18- Completed Follow up at home with O2 saturations. Keep them at 90% or higher Take Ondasetron as needed for Nausea 12/16 - Keflex  500mg  twice a day for seven days due to UTI -   Patient Self Care  Activities: (reviewed 05/04/24) Attend all scheduled provider appointments Call pharmacy for medication refills 3-7 days in advance of running out of medications Call provider office for new concerns or questions  Notify RN Care Manager of [facility] call rescheduling needs Participate in Transition of Care Program/Attend [facility] scheduled calls Perform all self care activities independently  Take medications as prescribed    Plan:  Telephone follow up appointment with care management team member scheduled for:   Tuesday December 30th at 2:00pm        The patient verbalized understanding of instructions, educational materials, and care plan provided today and agreed to receive a mailed copy of patient instructions, educational materials, and care plan.   The patient has been provided with contact information for the care management team and has been advised to call with any health related questions or concerns.   Please call the care guide team at [phone] if you need to cancel or reschedule your appointment.   Please call the Suicide and Crisis Lifeline: 988 call the [place]  National Suicide Prevention Lifeline: 803-517-2876 or TTY: 952-370-6093 TTY [phone]) to talk to a trained counselor if you are experiencing a Mental Health or Behavioral Health Crisis or need someone to talk to.  Medford Balboa, BSN, RN [place]  VBCI - [facility] RN Care Manager [phone]

## 2024-05-18 ENCOUNTER — Other Ambulatory Visit: Payer: Self-pay

## 2024-05-18 NOTE — Transitions of Care (Post Inpatient/ED Visit) (Signed)
 " Transition of Care Week 5  Visit Note  05/18/2024  Name: Vanessa Reynolds MRN: [id number]          DOB: 1945-08-29  Situation: Patient enrolled in [facility] 30-day program. Visit completed with Dickey Sink by telephone.   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   Rectal cancer [facility])     Assessment: Patient Reported Symptoms: Cognitive Cognitive Status: Alert and oriented to person, place, and time, Normal speech and language skills      Neurological Neurological Review of Symptoms: No symptoms reported    HEENT HEENT Symptoms Reported: No symptoms reported      Cardiovascular Cardiovascular Symptoms Reported: No symptoms reported Cardiovascular Management Strategies: Medication therapy  Respiratory Respiratory Symptoms Reported: No symptoms reported    Endocrine Endocrine Symptoms Reported: No symptoms reported Is patient diabetic?: No    Gastrointestinal Gastrointestinal Symptoms Reported: No symptoms reported      Genitourinary Genitourinary Symptoms Reported: No symptoms reported Genitourinary Comment: The patient has finished her antibiotics and states she is not having any further urinary symptoms  Integumentary Integumentary Symptoms Reported: No symptoms reported    Musculoskeletal Musculoskelatal Symptoms Reviewed: No symptoms reported        Psychosocial           There were no vitals filed for this visit.    Medications  Reviewed Today     Reviewed by Moises Reusing, RN (Case Manager) on 05/18/24 at 1608  Med List Status: <None>   Medication Order Taking? Sig Documenting Provider Last Dose Status Informant  albuterol  (VENTOLIN  HFA) 108 (90 Base) MCG/ACT inhaler [id number]  Inhale 2 puffs into the lungs every 6 (six) hours as needed for wheezing or shortness of breath. Malka Domino, MD  Active   cephALEXin  (KEFLEX ) 500 MG capsule [id number]  Take 1 capsule (500 mg total) by mouth 3 (three) times daily. For 7 days  Patient not taking: Reported on 05/18/2024   Edman Marsa PARAS, DO  Active   fluticasone  (FLONASE ) 50 MCG/ACT nasal spray [id number]  PLACE 2 SPRAYS INTO BOTH NOSTRILS DAILY.USE FOR 4-6 WEEKS THEN STOP AND USE SEASONALLY OR AS NEEDED Karamalegos, Marsa PARAS, DO  Active Self, Spouse/Significant Other           Med Note BEVERLEE, JOYCE   Fri Jul 25, 2023  6:18 AM) prn  loratadine  (CLARITIN ) 10 MG tablet [id number]  Take 1 tablet (10 mg total) by mouth daily. Use for 4-6 weeks then stop, and use as needed or seasonally [place], Marsa PARAS, DO  Active Self, Spouse/Significant Other           Med Note BEVERLEE, JOYCE   Fri Jul 25, 2023  6:16 AM) prn  losartan  (COZAAR ) 25 MG tablet [id number]  Take 1 tablet (25 mg total) by mouth daily. Edman Marsa PARAS, DO  Active   lovastatin  (MEVACOR ) 20 MG tablet [id number]  Take 1 tablet (  20 mg total) by mouth at bedtime. Edman Marsa PARAS, DO  Active   omeprazole  (PRILOSEC) 40 MG capsule [id number]  TAKE 1 CAPSULE BY MOUTH ONCE DAILY BEFORE BREAKFAST Karamalegos, Marsa PARAS, DO  Active Self, Spouse/Significant Other  ondansetron  (ZOFRAN -ODT) 8 MG disintegrating tablet [id number]  Take 1 tablet (8 mg total) by mouth 2 (two) times daily as needed. Edman Marsa PARAS, DO  Active   vitamin B-12 (CYANOCOBALAMIN ) 100 MCG tablet [id number]  Take 100 mcg by mouth daily. [provider]  Active Self, Spouse/Significant Other  VITAMIN D  PO  [id number]  Take by mouth. [provider]  Active             Recommendation:   The patient has competed the [facility] 30 day program.  Follow Up Plan:   Closing From:  Transitions of Care Program  [facility], BSN, RN [place]  VBCI - [facility] RN Care Manager [phone]     "

## 2024-05-18 NOTE — Patient Instructions (Signed)
 Visit Information  Thank you for taking time to visit with me today. Please don't hesitate to contact me if I can be of assistance to you before our next scheduled telephone appointment.   Following is a copy of your care plan:   Goals Addressed             This Visit's Progress    COMPLETED: VBCI Transitions of Care (TOC) Care Plan       Problems: (reviewed 05/18/24) Recent Hospitalization for treatment of Sepsis due to UTI  Goal: (reviewed 05/18/24) Over the next 30 days, the patient will not experience hospital readmission  Interventions: (reviewed 05/18/24)  Evaluation of current treatment plan related to Sepsis with UTI, self-management and patient's adherence to plan as established by provider. Discussed plans with patient for ongoing care management follow up and provided patient with direct contact information for care management team You have been given 2 different kind of antibiotics, Zithromax  you will take for the next 3 days and Augmentin  for next 4 days, please start from tomorrow as you already received today's dose. - 11/19 - Completed We also started you on low-dose losartan  as your blood pressure remained elevated-please follow-up closely with your primary care provider for further assistance and dose titration. As we discussed you have a lung nodule close to your lining of lung which need surveillance. Please follow-up with a pulmonologist for further assistance. - Appointment is scheduled for 04/26/24 - Completed Please keep yourself well-hydrated and continue taking rest of your home medications as you are doing it before. Follow-up with your primary care provider for further assistance - Appointment 11/18- Completed Follow up at home with O2 saturations. Keep them at 90% or higher Take Ondasetron as needed for Nausea 12/16 - Keflex  500mg  twice a day for seven days due to UTI - completed  Patient Self Care Activities: (reviewed 05/18/24) Attend all scheduled  provider appointments Call pharmacy for medication refills 3-7 days in advance of running out of medications Call provider office for new concerns or questions  Notify RN Care Manager of TOC call rescheduling needs Participate in Transition of Care Program/Attend TOC scheduled calls Perform all self care activities independently  Take medications as prescribed    Plan:  Telephone follow up appointment with care management team member scheduled for:  The patient has completed the 30 Day TOC program        Patient verbalizes understanding of instructions and care plan provided today and agrees to view in MyChart. Active MyChart status and patient understanding of how to access instructions and care plan via MyChart confirmed with patient.     The patient has been provided with contact information for the care management team and has been advised to call with any health related questions or concerns.   Please call the care guide team at [phone] if you need to cancel or reschedule your appointment.   Please call the Suicide and Crisis Lifeline: 988 call the [place]  National Suicide Prevention Lifeline: (415)415-7824 or TTY: 8198213726 TTY 707-326-0625) to talk to a trained counselor if you are experiencing a Mental Health or Behavioral Health Crisis or need someone to talk to.  Medford Balboa, BSN, RN [place]  VBCI - [employer] [facility] RN Care Manager [phone]

## 2024-05-24 ENCOUNTER — Other Ambulatory Visit

## 2024-05-31 ENCOUNTER — Ambulatory Visit: Attending: Cardiology | Admitting: Cardiology

## 2024-06-14 ENCOUNTER — Ambulatory Visit: Admitting: Physician Assistant

## 2024-07-01 ENCOUNTER — Ambulatory Visit: Admitting: Gastroenterology

## 2024-07-05 ENCOUNTER — Ambulatory Visit: Admitting: Cardiology

## 2024-08-12 ENCOUNTER — Ambulatory Visit: Admitting: Pulmonary Disease

## 2024-08-12 ENCOUNTER — Encounter

## 2024-10-01 ENCOUNTER — Ambulatory Visit: Admitting: Family Medicine
